# Patient Record
Sex: Male | Born: 1950 | Race: White | Hispanic: No | Marital: Married | Smoking: Former smoker
Health system: Southern US, Community
[De-identification: ages and names within clinical notes are randomized; demographics above are authoritative.]

## PROBLEM LIST (undated history)

## (undated) DIAGNOSIS — Z87442 Personal history of urinary calculi: Secondary | ICD-10-CM

## (undated) DIAGNOSIS — T7840XA Allergy, unspecified, initial encounter: Secondary | ICD-10-CM

## (undated) DIAGNOSIS — E119 Type 2 diabetes mellitus without complications: Secondary | ICD-10-CM

## (undated) DIAGNOSIS — K7460 Unspecified cirrhosis of liver: Secondary | ICD-10-CM

## (undated) DIAGNOSIS — I2510 Atherosclerotic heart disease of native coronary artery without angina pectoris: Secondary | ICD-10-CM

## (undated) HISTORY — PX: HERNIA REPAIR: SHX51

## (undated) HISTORY — DX: Allergy, unspecified, initial encounter: T78.40XA

---

## 2005-11-02 ENCOUNTER — Ambulatory Visit: Payer: Self-pay | Admitting: Family Medicine

## 2011-01-31 DIAGNOSIS — E119 Type 2 diabetes mellitus without complications: Secondary | ICD-10-CM | POA: Insufficient documentation

## 2011-01-31 DIAGNOSIS — L03119 Cellulitis of unspecified part of limb: Secondary | ICD-10-CM | POA: Insufficient documentation

## 2011-06-22 DIAGNOSIS — R300 Dysuria: Secondary | ICD-10-CM | POA: Insufficient documentation

## 2012-01-30 DIAGNOSIS — R7309 Other abnormal glucose: Secondary | ICD-10-CM | POA: Insufficient documentation

## 2012-07-13 DIAGNOSIS — M1990 Unspecified osteoarthritis, unspecified site: Secondary | ICD-10-CM | POA: Insufficient documentation

## 2012-08-09 ENCOUNTER — Emergency Department (HOSPITAL_COMMUNITY): Payer: Managed Care, Other (non HMO)

## 2012-08-09 ENCOUNTER — Encounter (HOSPITAL_COMMUNITY): Payer: Self-pay

## 2012-08-09 ENCOUNTER — Emergency Department (HOSPITAL_COMMUNITY)
Admission: EM | Admit: 2012-08-09 | Discharge: 2012-08-09 | Disposition: A | Discharge status: Home / Self Care | Payer: Managed Care, Other (non HMO) | Attending: Emergency Medicine | Admitting: Emergency Medicine

## 2012-08-09 DIAGNOSIS — E119 Type 2 diabetes mellitus without complications: Secondary | ICD-10-CM | POA: Insufficient documentation

## 2012-08-09 DIAGNOSIS — N200 Calculus of kidney: Secondary | ICD-10-CM | POA: Insufficient documentation

## 2012-08-09 DIAGNOSIS — R350 Frequency of micturition: Secondary | ICD-10-CM | POA: Insufficient documentation

## 2012-08-09 HISTORY — DX: Type 2 diabetes mellitus without complications: E11.9

## 2012-08-09 LAB — CBC WITH DIFFERENTIAL/PLATELET
Basophils Absolute: 0 K/uL (ref 0.0–0.1)
Basophils Relative: 0 % (ref 0–1)
Eosinophils Absolute: 0.3 K/uL (ref 0.0–0.7)
Eosinophils Relative: 3 % (ref 0–5)
HCT: 44.7 % (ref 39.0–52.0)
Hemoglobin: 15.3 g/dL (ref 13.0–17.0)
Lymphocytes Relative: 35 % (ref 12–46)
Lymphs Abs: 2.8 K/uL (ref 0.7–4.0)
MCH: 29.1 pg (ref 26.0–34.0)
MCHC: 34.2 g/dL (ref 30.0–36.0)
MCV: 85 fL (ref 78.0–100.0)
Monocytes Absolute: 0.9 K/uL (ref 0.1–1.0)
Monocytes Relative: 11 % (ref 3–12)
Neutro Abs: 4 K/uL (ref 1.7–7.7)
Neutrophils Relative %: 50 % (ref 43–77)
Platelets: 117 K/uL — ABNORMAL LOW (ref 150–400)
RBC: 5.26 MIL/uL (ref 4.22–5.81)
RDW: 13.7 % (ref 11.5–15.5)
WBC: 7.9 K/uL (ref 4.0–10.5)

## 2012-08-09 LAB — BASIC METABOLIC PANEL WITH GFR
BUN: 18 mg/dL (ref 6–23)
CO2: 20 meq/L (ref 19–32)
Calcium: 9 mg/dL (ref 8.4–10.5)
Chloride: 104 meq/L (ref 96–112)
Creatinine, Ser: 0.99 mg/dL (ref 0.50–1.35)
GFR calc Af Amer: >90 mL/min
GFR calc non Af Amer: 87 mL/min — ABNORMAL LOW
Glucose, Bld: 131 mg/dL — ABNORMAL HIGH (ref 70–99)
Potassium: 3.9 meq/L (ref 3.5–5.1)
Sodium: 135 meq/L (ref 135–145)

## 2012-08-09 LAB — URINE MICROSCOPIC-ADD ON

## 2012-08-09 LAB — URINALYSIS, ROUTINE W REFLEX MICROSCOPIC
Bilirubin Urine: NEGATIVE
Glucose, UA: NEGATIVE mg/dL
Ketones, ur: NEGATIVE mg/dL
Leukocytes, UA: NEGATIVE
Nitrite: NEGATIVE
Protein, ur: NEGATIVE mg/dL
Specific Gravity, Urine: >1.03 — ABNORMAL HIGH (ref 1.005–1.030)
Urobilinogen, UA: 0.2 mg/dL (ref 0.0–1.0)
pH: 5 (ref 5.0–8.0)

## 2012-08-09 MED ORDER — TAMSULOSIN HCL 0.4 MG PO CAPS: 0.4000 mg | ORAL_CAPSULE | Freq: Every day | ORAL | Status: DC

## 2012-08-09 MED ORDER — CIPROFLOXACIN HCL 500 MG PO TABS: 500.0000 mg | ORAL_TABLET | Freq: Two times a day (BID) | ORAL | Status: DC

## 2012-08-09 MED ORDER — ONDANSETRON 4 MG PO TBDP: ORAL_TABLET | ORAL | Status: DC

## 2012-08-09 MED ORDER — HYDROMORPHONE HCL PF 1 MG/ML IJ SOLN
1.0000 mg | Freq: Once | INTRAMUSCULAR | Status: AC
  Administered 2012-08-09: 1 mg via INTRAVENOUS
  Filled 2012-08-09: qty 1

## 2012-08-09 MED ORDER — OXYCODONE-ACETAMINOPHEN 5-325 MG PO TABS: 1.0000 | ORAL_TABLET | Freq: Four times a day (QID) | ORAL | Status: AC | PRN

## 2012-08-09 MED ORDER — ONDANSETRON HCL 4 MG/2ML IJ SOLN
4.0000 mg | Freq: Once | INTRAMUSCULAR | Status: AC
  Administered 2012-08-09: 4 mg via INTRAVENOUS
  Filled 2012-08-09: qty 2

## 2012-08-09 MED ORDER — SODIUM CHLORIDE 0.9 % IV BOLUS (SEPSIS)
500.0000 mL | Freq: Once | INTRAVENOUS | Status: AC
  Administered 2012-08-09: 500 mL via INTRAVENOUS

## 2012-08-09 NOTE — ED Notes (Signed)
C/o rt flank pain that started this am. Denies any difficulty voiding or nausea

## 2012-08-09 NOTE — ED Provider Notes (Signed)
History    This chart was scribed for Alexander Lennert, MD, MD by Smitty Pluck, ED Scribe. The patient was seen in room APA12 and the patient's care was started at 11:23 AM.   CSN: [id number]  Arrival date & time 08/09/12  4098      Chief Complaint  Patient presents with  . Flank Pain    (Consider location/radiation/quality/duration/timing/severity/associated sxs/prior treatment) Patient is a 61 y.o. male presenting with flank pain. The history is provided by the patient. No language interpreter was used.  Flank Pain This is a new problem. The current episode started 1 to 2 hours ago. The problem occurs constantly. The problem has not changed since onset.Pertinent negatives include no chest pain, no abdominal pain and no headaches. Nothing aggravates the symptoms. Nothing relieves the symptoms. He has tried nothing for the symptoms.   Wolf Boulay is a 62 y.o. male who presents to the Emergency Department complaining of constant, moderate right flank pain onset today. Pt denies hx of similar pain. He reports symptoms started suddenly. He denies nausea, vomiting, diarrhea, fever and any other pain..   Past Medical History  Diagnosis Date  . Diabetes mellitus without complication     Past Surgical History  Procedure Date  . Hernia repair     No family history on file.  History  Substance Use Topics  . Smoking status: Never Smoker   . Smokeless tobacco: Not on file  . Alcohol Use: No      Review of Systems  Constitutional: Negative for fatigue.  HENT: Negative for congestion, sinus pressure and ear discharge.   Eyes: Negative for discharge.  Respiratory: Negative for cough.   Cardiovascular: Negative for chest pain.  Gastrointestinal: Negative for abdominal pain and diarrhea.  Genitourinary: Positive for frequency and flank pain. Negative for hematuria.  Musculoskeletal: Negative for back pain.  Skin: Negative for rash.  Neurological: Negative for seizures and  headaches.  Hematological: Negative.   Psychiatric/Behavioral: Negative for hallucinations.  All other systems reviewed and are negative.    Allergies  Benadryl  Home Medications  No current outpatient prescriptions on file.  BP 155/97  Pulse 77  Temp 98.1 F (36.7 C)  Resp 16  Wt 264 lb (119.75 kg)  SpO2 94%  Physical Exam  Constitutional: He is oriented to person, place, and time. He appears well-developed.  HENT:  Head: Normocephalic and atraumatic.  Eyes: Conjunctivae normal and EOM are normal. No scleral icterus.  Neck: Neck supple. No thyromegaly present.  Cardiovascular: Normal rate and regular rhythm.  Exam reveals no gallop and no friction rub.   No murmur heard. Pulmonary/Chest: No stridor. He has no wheezes. He has no rales. He exhibits no tenderness.  Abdominal: He exhibits no distension. There is tenderness (right flank). There is no rebound.  Musculoskeletal: Normal range of motion. He exhibits no edema.  Lymphadenopathy:    He has no cervical adenopathy.  Neurological: He is oriented to person, place, and time. Coordination normal.  Skin: No rash noted. No erythema.  Psychiatric: He has a normal mood and affect. His behavior is normal.    ED Course  Procedures (including critical care time) DIAGNOSTIC STUDIES: Oxygen Saturation is 94% on room air, low by my interpretation.    COORDINATION OF CARE: 11:25 AM Discussed ED treatment with pt  11:27 AM Ordered: Medications - No data to display    Labs Reviewed - No data to display No results found.   No diagnosis found.  MDM  Kidney stones.  Spoke with dr. Jerre Simon and he will follow up monday      The chart was scribed for me under my direct supervision.  I personally performed the history, physical, and medical decision making and all procedures in the evaluation of this patient.Alexander Lennert, MD 08/09/12 [phone]

## 2012-08-10 LAB — URINE CULTURE

## 2012-08-13 ENCOUNTER — Encounter (HOSPITAL_COMMUNITY): Payer: Self-pay | Admitting: Pharmacy Technician

## 2012-08-14 NOTE — Patient Instructions (Addendum)
20 Alexander Parks  08/14/2012   Your procedure is scheduled on:  08/21/2012  Report to [facility] at  730  AM.  Call this number if you have problems the morning of surgery: [phone]   Remember:   Do not eat food:After Midnight.  May have clear liquids:until Midnight .    Take these medicines the morning of surgery with A SIP OF WATER:  Zofran,percocet,flomax   Do not wear jewelry, make-up or nail polish.  Do not wear lotions, powders, or perfumes.   Do not shave 48 hours prior to surgery. Men may shave face and neck.  Do not bring valuables to the hospital.  Contacts, dentures or bridgework may not be worn into surgery.  Leave suitcase in the car. After surgery it may be brought to your room.  For patients admitted to the hospital, checkout time is 11:00 AM the day of discharge.   Patients discharged the day of surgery will not be allowed to drive home.  Name and phone number of your driver: family  Special Instructions: Shower using CHG 2 nights before surgery and the night before surgery.  If you shower the day of surgery use CHG.  Use special wash - you have one bottle of CHG for all showers.  You should use approximately 1/3 of the bottle for each shower.   Please read over the following fact sheets that you were given: Pain Booklet, Coughing and Deep Breathing, MRSA Information, Surgical Site Infection Prevention, Anesthesia Post-op Instructions and Care and Recovery After Surgery Cystoscopy (Bladder Exam) A cystoscopy is an examination of your urinary bladder with a cystoscope. A cystoscope is an instrument like a small telescope with strong lights and lenses. It is inserted into the bladder through the urethra (the opening into the bladder) and allows your caregiver to examine the inside of your bladder. The procedure causes little discomfort and can be done in a hospital or office. It is a diagnostic procedure to evaluate the inside of your bladder. It may involve x-rays  to further evaluate the ureters or internal aspects of the kidneys. It may aid in the removal of urinary stones  or in taking tissue samples (biopsies) if necessary. The procedure is easier in females because of a shorter urethra. In a male, the procedure must be done through the penis. This often requires more sedation and more time to do the procedure. The procedure usually takes twenty minutes to one half hour for a male and approximately an hour for a male. LET YOUR CAREGIVERS KNOW ABOUT:  Allergies.  Medications taken including herbs, eye drops, over the counter medications, and creams.  Use of steroids (by mouth or creams).  Previous problems with anesthetics or novocaine.  Possibility of pregnancy, if this applies.  History of blood clots (thrombophlebitis).  History of bleeding or blood problems.  Previous surgery, especially where prosthetics have been used like hip or knee replacements, and heart valve replacements.  Other health problems. BEFORE THE PROCEDURE  You should be present 60 minutes prior to your procedure or as directed.  PROCEDURE During the procedure, you will:  Be assisted by your urologist and a nurse.  Lie on a cystoscopy table with your knees elevated and legs apart and covered with a drape. For women this is the same position as when a pap smear is taken.  Have the urethral area or penis washed and covered with sterile towels.  Have an anesthetic (numbing) jelly applied to the  urethra. This is usually all that is required for females but males may also require sedation.  Have the cystoscope inserted through the urethra and into the bladder. Sterile fluid will flow through the cystoscope and into the bladder. This will expand the bladder and provide clear fluid for the urologist to look through and examine the interior of the bladder.  Be allowed to go home once you are doing well, are stable, and awake if you were given a sedative. If given a  sedative, have someone give you a ride home. AFTER THE PROCEDURE  You may have temporary bleeding and burning on urination.  Drink lots of fluids. SEEK IMMEDIATE MEDICAL CARE IF:  There is an increase in blood in the urine or if you are passing clots.  You have difficulty in passing your urine  You develop chills and/or an unexplained oral temperature above 102 F (38.9 C). Your caregiver will discuss your results with you following the procedure. This may be at a later time if you have been sedated. If other testing or biopsies were taken, ask your caregiver how you are to obtain the results. Remember it is your responsibility to get your results. Do not assume everything is normal if you do not hear from your caregiver. Document Released: 07/29/2000 Document Revised: 10/24/2011 Document Reviewed: 01/23/2012 [facility] Patient Information 2013 [place], [place]. PATIENT INSTRUCTIONS POST-ANESTHESIA  IMMEDIATELY FOLLOWING SURGERY:  Do not drive or operate machinery for the first twenty four hours after surgery.  Do not make any important decisions for twenty four hours after surgery or while taking narcotic pain medications or sedatives.  If you develop intractable nausea and vomiting or a severe headache please notify your doctor immediately.  FOLLOW-UP:  Please make an appointment with your surgeon as instructed. You do not need to follow up with anesthesia unless specifically instructed to do so.  WOUND CARE INSTRUCTIONS (if applicable):  Keep a dry clean dressing on the anesthesia/puncture wound site if there is drainage.  Once the wound has quit draining you may leave it open to air.  Generally you should leave the bandage intact for twenty four hours unless there is drainage.  If the epidural site drains for more than 36-48 hours please call the anesthesia department.  QUESTIONS?:  Please feel free to call your physician or the hospital operator if you have any questions, and they will be  happy to assist you.

## 2012-08-16 ENCOUNTER — Encounter (HOSPITAL_COMMUNITY): Payer: Self-pay

## 2012-08-16 ENCOUNTER — Encounter (HOSPITAL_COMMUNITY)
Admission: RE | Admit: 2012-08-16 | Discharge: 2012-08-16 | Disposition: A | Discharge status: Home / Self Care | Payer: Managed Care, Other (non HMO) | Source: Ambulatory Visit | Attending: Urology | Admitting: Urology

## 2012-08-16 LAB — SURGICAL PCR SCREEN: Staphylococcus aureus: NEGATIVE

## 2012-08-16 NOTE — Progress Notes (Signed)
08/16/12 1349  OBSTRUCTIVE SLEEP APNEA  Have you ever been diagnosed with sleep apnea through a sleep study? No  Do you snore loudly (loud enough to be heard through closed doors)?  1  Do you often feel tired, fatigued, or sleepy during the daytime? 0  Has anyone observed you stop breathing during your sleep? 0  Do you have, or are you being treated for high blood pressure? 0  BMI more than 35 kg/m2? 1  Age over 62 years old? 1  Neck circumference greater than 40 cm/18 inches? 1  Gender: 1  Obstructive Sleep Apnea Score 5   Score 4 or greater  Results sent to PCP

## 2012-08-21 ENCOUNTER — Ambulatory Visit (HOSPITAL_COMMUNITY)
Admission: RE | Admit: 2012-08-21 | Discharge: 2012-08-21 | Disposition: A | Discharge status: Home / Self Care | Payer: Managed Care, Other (non HMO) | Source: Ambulatory Visit | Attending: Urology | Admitting: Urology

## 2012-08-21 ENCOUNTER — Ambulatory Visit (HOSPITAL_COMMUNITY): Payer: Managed Care, Other (non HMO) | Admitting: Anesthesiology

## 2012-08-21 ENCOUNTER — Ambulatory Visit (HOSPITAL_COMMUNITY): Payer: Managed Care, Other (non HMO)

## 2012-08-21 ENCOUNTER — Encounter (HOSPITAL_COMMUNITY): Payer: Self-pay | Admitting: Anesthesiology

## 2012-08-21 ENCOUNTER — Encounter (HOSPITAL_COMMUNITY)
Admission: RE | Disposition: A | Discharge status: Home / Self Care | Payer: Self-pay | Source: Ambulatory Visit | Attending: Urology

## 2012-08-21 ENCOUNTER — Encounter (HOSPITAL_COMMUNITY): Payer: Self-pay | Admitting: *Deleted

## 2012-08-21 DIAGNOSIS — N201 Calculus of ureter: Secondary | ICD-10-CM | POA: Insufficient documentation

## 2012-08-21 DIAGNOSIS — E119 Type 2 diabetes mellitus without complications: Secondary | ICD-10-CM | POA: Insufficient documentation

## 2012-08-21 DIAGNOSIS — Z01812 Encounter for preprocedural laboratory examination: Secondary | ICD-10-CM | POA: Insufficient documentation

## 2012-08-21 DIAGNOSIS — Z01810 Encounter for preprocedural cardiovascular examination: Secondary | ICD-10-CM | POA: Insufficient documentation

## 2012-08-21 HISTORY — PX: CYSTOSCOPY W/ URETERAL STENT PLACEMENT: SHX1429

## 2012-08-21 HISTORY — PX: BALLOON DILATION: SHX5330

## 2012-08-21 HISTORY — PX: HOLMIUM LASER APPLICATION: SHX5852

## 2012-08-21 HISTORY — PX: STONE EXTRACTION WITH BASKET: SHX5318

## 2012-08-21 SURGERY — CYSTOSCOPY, WITH RETROGRADE PYELOGRAM AND URETERAL STENT INSERTION
Anesthesia: General | Laterality: Right | Wound class: Clean Contaminated

## 2012-08-21 MED ORDER — LACTATED RINGERS IV SOLN
INTRAVENOUS | Status: DC
  Administered 2012-08-21 (×2): via INTRAVENOUS

## 2012-08-21 MED ORDER — IOHEXOL 350 MG/ML SOLN
INTRAVENOUS | Status: DC | PRN
  Administered 2012-08-21: 50 mL

## 2012-08-21 MED ORDER — LIDOCAINE HCL (PF) 1 % IJ SOLN
INTRAMUSCULAR | Status: AC
  Filled 2012-08-21: qty 5

## 2012-08-21 MED ORDER — ONDANSETRON HCL 4 MG/2ML IJ SOLN: 4.0000 mg | Freq: Once | INTRAMUSCULAR | Status: DC | PRN

## 2012-08-21 MED ORDER — FENTANYL CITRATE 0.05 MG/ML IJ SOLN: 25.0000 ug | INTRAMUSCULAR | Status: DC | PRN

## 2012-08-21 MED ORDER — MIDAZOLAM HCL 2 MG/2ML IJ SOLN
1.0000 mg | INTRAMUSCULAR | Status: DC | PRN
  Administered 2012-08-21: 2 mg via INTRAVENOUS

## 2012-08-21 MED ORDER — PROPOFOL 10 MG/ML IV EMUL
INTRAVENOUS | Status: AC
  Filled 2012-08-21: qty 20

## 2012-08-21 MED ORDER — MIDAZOLAM HCL 2 MG/2ML IJ SOLN
INTRAMUSCULAR | Status: AC
  Filled 2012-08-21: qty 2

## 2012-08-21 MED ORDER — FENTANYL CITRATE 0.05 MG/ML IJ SOLN
INTRAMUSCULAR | Status: DC | PRN
  Administered 2012-08-21: 25 ug via INTRAVENOUS
  Administered 2012-08-21: 50 ug via INTRAVENOUS

## 2012-08-21 MED ORDER — ONDANSETRON HCL 4 MG/2ML IJ SOLN
INTRAMUSCULAR | Status: AC
  Filled 2012-08-21: qty 2

## 2012-08-21 MED ORDER — LIDOCAINE HCL 1 % IJ SOLN
INTRAMUSCULAR | Status: DC | PRN
  Administered 2012-08-21: 50 mg via INTRADERMAL

## 2012-08-21 MED ORDER — SODIUM CHLORIDE 0.9 % IR SOLN
Status: DC | PRN
  Administered 2012-08-21 (×4): 3000 mL

## 2012-08-21 MED ORDER — MIDAZOLAM HCL 5 MG/5ML IJ SOLN
INTRAMUSCULAR | Status: DC | PRN
  Administered 2012-08-21: 2 mg via INTRAVENOUS

## 2012-08-21 MED ORDER — FENTANYL CITRATE 0.05 MG/ML IJ SOLN
INTRAMUSCULAR | Status: AC
  Filled 2012-08-21: qty 2

## 2012-08-21 MED ORDER — OXYCODONE-ACETAMINOPHEN 7.5-325 MG PO TABS: 1.0000 | ORAL_TABLET | Freq: Four times a day (QID) | ORAL | Status: DC | PRN

## 2012-08-21 MED ORDER — ONDANSETRON HCL 4 MG/2ML IJ SOLN
4.0000 mg | Freq: Once | INTRAMUSCULAR | Status: AC
  Administered 2012-08-21: 4 mg via INTRAVENOUS

## 2012-08-21 MED ORDER — 0.9 % SODIUM CHLORIDE (POUR BTL) OPTIME
TOPICAL | Status: DC | PRN
  Administered 2012-08-21 (×2): 1000 mL

## 2012-08-21 MED ORDER — STERILE WATER FOR IRRIGATION IR SOLN
Status: DC | PRN
  Administered 2012-08-21: 1000 mL

## 2012-08-21 MED ORDER — PROPOFOL 10 MG/ML IV BOLUS
INTRAVENOUS | Status: DC | PRN
  Administered 2012-08-21: 160 mg via INTRAVENOUS

## 2012-08-21 SURGICAL SUPPLY — 22 items
BAG DRAIN URO TABLE W/ADPT NS (DRAPE) ×2 IMPLANT
BAG DRN 8 ADPR NS SKTRN CSTL (DRAPE) ×1
BASKET/GRASPER STONE 3FR 13CM (MISCELLANEOUS) ×1 IMPLANT
CATH 5 FR WEDGE TIP (UROLOGICAL SUPPLIES) ×2 IMPLANT
CATH OPEN TIP 5FR (CATHETERS) ×2 IMPLANT
CLOTH BEACON ORANGE TIMEOUT ST (SAFETY) ×2 IMPLANT
DILATOR UROMAX ULTRA (MISCELLANEOUS) ×1 IMPLANT
GLOVE BIO SURGEON STRL SZ7 (GLOVE) ×2 IMPLANT
GOWN STRL REIN XL XLG (GOWN DISPOSABLE) ×2 IMPLANT
IV NS IRRIG 3000ML ARTHROMATIC (IV SOLUTION) ×6 IMPLANT
KIT ROOM TURNOVER AP CYSTO (KITS) ×2 IMPLANT
LASER FIBER DISP (UROLOGICAL SUPPLIES) ×2 IMPLANT
LASER FIBER DISP 1000U (UROLOGICAL SUPPLIES) IMPLANT
MANIFOLD NEPTUNE II (INSTRUMENTS) ×2 IMPLANT
NS IRRIG 1000ML POUR BTL (IV SOLUTION) ×2 IMPLANT
PACK CYSTO (CUSTOM PROCEDURE TRAY) ×2 IMPLANT
PAD ARMBOARD 7.5X6 YLW CONV (MISCELLANEOUS) ×2 IMPLANT
STENT PERCUFLEX 4.8FRX24 (STENTS) ×1 IMPLANT
STENT URET 6FRX24 CONTOUR (STENTS) ×1 IMPLANT
STONE RETRIEVAL GEMINI 2.4 FR (MISCELLANEOUS) ×2 IMPLANT
TOWEL OR 17X26 4PK STRL BLUE (TOWEL DISPOSABLE) ×1 IMPLANT
WIRE GUIDE BENTSON .035 15CM (WIRE) ×4 IMPLANT

## 2012-08-21 NOTE — Anesthesia Procedure Notes (Signed)
Procedure Name: LMA Insertion Date/Time: 08/21/2012 9:25 AM Performed by: Despina Hidden Pre-anesthesia Checklist: Emergency Drugs available, Suction available, Patient being monitored and Patient identified Patient Re-evaluated:Patient Re-evaluated prior to inductionOxygen Delivery Method: Circle system utilized Preoxygenation: Pre-oxygenation with 100% oxygen Intubation Type: IV induction Ventilation: Mask ventilation with difficulty LMA Size: 4.0 Grade View: Grade I Tube type: Oral Number of attempts: 1 Placement Confirmation: positive ETCO2 and breath sounds checked- equal and bilateral Tube secured with: Tape Dental Injury: Teeth and Oropharynx as per pre-operative assessment

## 2012-08-21 NOTE — Brief Op Note (Signed)
08/21/2012  12:11 PM  PATIENT:  Alexander Parks  62 y.o. male  PRE-OPERATIVE DIAGNOSIS:  right ureteral calculus  POST-OPERATIVE DIAGNOSIS:  right ureteral calculus  PROCEDURE:  Procedure(s) (LRB) with comments: CYSTOSCOPY WITH RETROGRADE PYELOGRAM/URETERAL STENT PLACEMENT (Right) STONE EXTRACTION WITH BASKET (Right) HOLMIUM LASER APPLICATION (Right) BALLOON DILATION (Right) - Balloon Dilation Right Ureter  SURGEON:  Surgeon(s) and Role:    * Ky Barban, MD - Primary  PHYSICIAN ASSISTANT:   ASSISTANTS: none   ANESTHESIA:   general  EBL:  Total I/O In: 1100 [I.V.:1100] Out: -   BLOOD ADMINISTERED:none  DRAINS:    LOCAL MEDICATIONS USED:  NONE  SPECIMEN:  Source of Specimen:  double j stent no   string  DISPOSITION OF SPECIMEN:  N/A  COUNTS:  YES  TOURNIQUET:  * No tourniquets in log *  DICTATION: .Other Dictation: Dictation Number dictation [id number]  PLAN OF CARE: Discharge to home after PACU  PATIENT DISPOSITION:  PACU - hemodynamically stable.   Delay start of Pharmacological VTE agent (>24hrs) due to surgical blood loss or risk of bleeding:

## 2012-08-21 NOTE — Transfer of Care (Signed)
Immediate Anesthesia Transfer of Care Note  Patient: Alexander Parks  Procedure(s) Performed: Procedure(s) (LRB) with comments: CYSTOSCOPY WITH RETROGRADE PYELOGRAM/URETERAL STENT PLACEMENT (Right) STONE EXTRACTION WITH BASKET (Right) HOLMIUM LASER APPLICATION (Right) BALLOON DILATION (Right) - Balloon Dilation Right Ureter  Patient Location: PACU  Anesthesia Type:General  Level of Consciousness: awake and patient cooperative  Airway & Oxygen Therapy: Patient Spontanous Breathing  Post-op Assessment: Report given to PACU RN, Post -op Vital signs reviewed and stable and Patient moving all extremities  Post vital signs: Reviewed and stable  Complications: No apparent anesthesia complications

## 2012-08-21 NOTE — Progress Notes (Signed)
No change in H&P on reexamination. 

## 2012-08-21 NOTE — Anesthesia Preprocedure Evaluation (Signed)
Anesthesia Evaluation  Patient identified by MRN, date of birth, ID band Patient awake    Reviewed: Allergy & Precautions, H&P , NPO status , Patient's Chart, lab work & pertinent test results  Airway Mallampati: I      Dental  (+) Edentulous Upper and Edentulous Lower   Pulmonary former smoker,  breath sounds clear to auscultation        Cardiovascular negative cardio ROS  Rhythm:Regular Rate:Normal     Neuro/Psych    GI/Hepatic negative GI ROS,   Endo/Other  diabetes, Type 2, Oral Hypoglycemic Agents  Renal/GU      Musculoskeletal   Abdominal   Peds  Hematology   Anesthesia Other Findings   Reproductive/Obstetrics                           Anesthesia Physical Anesthesia Plan  ASA: II  Anesthesia Plan: General   Post-op Pain Management:    Induction: Intravenous  Airway Management Planned: LMA  Additional Equipment:   Intra-op Plan:   Post-operative Plan: Extubation in OR  Informed Consent: I have reviewed the patients History and Physical, chart, labs and discussed the procedure including the risks, benefits and alternatives for the proposed anesthesia with the patient or authorized representative who has indicated his/her understanding and acceptance.     Plan Discussed with:   Anesthesia Plan Comments:         Anesthesia Quick Evaluation

## 2012-08-21 NOTE — Anesthesia Postprocedure Evaluation (Signed)
  Anesthesia Post-op Note  Patient: Alexander Parks  Procedure(s) Performed: Procedure(s) (LRB) with comments: CYSTOSCOPY WITH RETROGRADE PYELOGRAM/URETERAL STENT PLACEMENT (Right) - Cystoscopy with Right Retrograde Pyelogram, Right Ureteral Stent Placement STONE EXTRACTION WITH BASKET (Right) - Right Ureteral Stone Basket Extraction HOLMIUM LASER APPLICATION (Right) BALLOON DILATION (Right) - Balloon Dilation Right Ureter  Patient Location: PACU  Anesthesia Type:General  Level of Consciousness: awake, alert , oriented and patient cooperative  Airway and Oxygen Therapy: Patient Spontanous Breathing  Post-op Pain: none  Post-op Assessment: Post-op Vital signs reviewed, Patient's Cardiovascular Status Stable, Respiratory Function Stable, Patent Airway, No signs of Nausea or vomiting and Pain level controlled  Post-op Vital Signs: Reviewed and stable  Complications: No apparent anesthesia complications

## 2012-08-21 NOTE — H&P (Signed)
NAME:  Alexander, Parks NO.:  [id number]  MEDICAL RECORD NO.:  [id number]  LOCATION:                                 FACILITY:  PHYSICIAN:  Alexander Parks, M.D.DATE OF BIRTH:  03/25/1951  DATE OF ADMISSION:  08/21/2012 DATE OF DISCHARGE:  LH                             HISTORY & PHYSICAL   CHIEF COMPLAINT:  Right renal colic.  HISTORY OF PRESENT ILLNESS:  Alexander Parks is a 62 year old gentleman who was seen by me on August 13, 2012, with history of right renal colic. At that time, he told me 3-4 days before, he went to the emergency room. He went there on August 09, 2012, and he has been having right flank pain and with nausea.  No vomiting, fever, or chills.  No gross hematuria.  At that time, he had some gross hematuria before he went to the emergency room.  A CT scan showed that he has 2 ureteral calculi in the distal right ureter just above the ureterovesical junction.  One stone is 7 mm and the other one is 11 mm.  He also has a gallstone which is 6 mm causing no obstruction.  He was having no more pain.  Having some urgency.  PAST MEDICAL HISTORY:  Has type 2 diabetes and is a noninsulin type.  No high blood pressure.  He had right inguinal hernia repair done in 1984.  FAMILY HISTORY:  No history of kidney stones or prostate cancer.  PERSONAL HISTORY:  He does not smoke or drink.  REVIEW OF SYSTEMS:  Unremarkable.  PHYSICAL EXAMINATION:  GENERAL:  Moderately built male, not in acute distress fully conscious, alert, oriented. VITAL SIGNS:  Blood pressure 138/87, temperature 97. CENTRAL NERVOUS SYSTEM:  No gross neurological deficit. HEAD, NECK, EYE, ENT:  Negative. CHEST:  Symmetrical normal breath sounds. HEART:  Regular sinus rhythm.  No murmur. ABDOMEN:  Soft, flat.  Liver, spleen, kidneys not palpable, and 1+ right CVA tenderness. GU: External genitalia is circumcised.  Meatus adequate.  Testicles are normal. RECTAL:  Examination  deferred. EXTREMITIES:  Normal.  IMPRESSION:  Right distal ureteral calculi with right hydroureteronephrosis and cholelithiasis.  Questionable small fat containing left inguinal hernia.  PLAN:  Cystoscopy, lower right retrograde pyelogram, ureteroscopic stone basket extraction.  Procedure and its complications were discussed, especially I discussed with them ureteral perforation leading to open surgery requiring several days to recover.  Use of double-J stent and stone migration with no guarantees about that we will get the stone out. He understands and want me to go ahead and try to get the stone out.     Alexander Parks, M.D.     MIJ/MEDQ  D:  08/20/2012  T:  08/21/2012  Job:  [id number]

## 2012-08-22 NOTE — Op Note (Signed)
NAME:  CORDELL, Alexander Parks NO.:  [id number]  MEDICAL RECORD NO.:  [id number]  LOCATION:  [facility]                          FACILITY:  [facility]  PHYSICIAN:  Ky Barban, M.D.DATE OF BIRTH:  1951/08/05  DATE OF PROCEDURE: DATE OF DISCHARGE:  08/21/2012                              OPERATIVE REPORT   PREOPERATIVE DIAGNOSIS:  Distal right ureteral calculi.  POSTOPERATIVE DIAGNOSIS:  Distal right ureteral calculi.  PROCEDURE: 1. Cystoscopy. 2. Right retrograde pyelogram. 3. Right ureteroscopic stone basket extraction. 4. Partial and holmium laser lithotripsy, insertion of double-J stent     size 5-French 24 cm.  ANESTHESIA:  General.  DESCRIPTION OF PROCEDURE:  The patient under general anesthesia, in lithotomy position, after usual prep and drape, a #25 cystoscope introduced into the bladder.  It was inspected, looks normal right ureteral orifice catheterized with a wedge catheter and Hypaque was injected under fluoroscopic control.  There was a large stone in the intramural ureter, maybe about 1 cm proximal to the ureteral orifice, and there is a second filling defect just above that.  Which is much smaller now, a guidewire was passed up into the renal pelvis over the guidewire, a 15 balloon dilator was introduced into the intramural ureter.  The intramural ureter was dilated.  Once it was dilated, the balloon was removed leaving the guidewire in.  Overall, alongside the guidewire introduced short rigid ureteroscope went to the level of the stone.  The stone is under the mucosa.  With the help of holmium laser, I was able to break the stone completely.  Most of the pieces were subsequently taken out with the help of a basket.  The second stone which was just above this was a stone which I initially seen, I think maybe it was basketed along with the other stones.  I could not find it. I looked into the entire ureter and all the way to the ureteropelvic junction.   I do not see any other stone.  So at this point, there are a couple of small pieces there in the distal ureter in the intramural ureter. I decided to come out and a double-J stent is passed over the guidewire under fluoroscopic control, nice loop was obtained in the bladder and the renal pelvis.  After removing the guidewire.  All the instruments were removed.  The patient left the operating room in satisfactory condition.     Ky Barban, M.D.     MIJ/MEDQ  D:  08/21/2012  T:  08/22/2012  Job:  [id number]

## 2012-08-23 ENCOUNTER — Encounter (HOSPITAL_COMMUNITY): Payer: Self-pay | Admitting: Urology

## 2013-02-09 DIAGNOSIS — B372 Candidiasis of skin and nail: Secondary | ICD-10-CM | POA: Insufficient documentation

## 2013-02-09 DIAGNOSIS — N476 Balanoposthitis: Secondary | ICD-10-CM | POA: Insufficient documentation

## 2013-04-11 DIAGNOSIS — N200 Calculus of kidney: Secondary | ICD-10-CM | POA: Insufficient documentation

## 2013-09-02 DIAGNOSIS — S40019A Contusion of unspecified shoulder, initial encounter: Secondary | ICD-10-CM | POA: Insufficient documentation

## 2013-12-07 DIAGNOSIS — M25519 Pain in unspecified shoulder: Secondary | ICD-10-CM | POA: Insufficient documentation

## 2014-04-29 ENCOUNTER — Ambulatory Visit: Payer: Self-pay | Admitting: Family Medicine

## 2014-04-29 LAB — BASIC METABOLIC PANEL
ANION GAP: 14 (ref 7–16)
BUN: 14 mg/dL (ref 7–18)
CO2: 20 mmol/L — AB (ref 21–32)
Calcium, Total: 9.1 mg/dL (ref 8.5–10.1)
Chloride: 101 mmol/L (ref 98–107)
Creatinine: 0.91 mg/dL (ref 0.60–1.30)
EGFR (African American): >60
Glucose: 164 mg/dL — ABNORMAL HIGH (ref 65–99)
Osmolality: 274 (ref 275–301)
Potassium: 3.5 mmol/L (ref 3.5–5.1)
Sodium: 135 mmol/L — ABNORMAL LOW (ref 136–145)

## 2014-04-29 LAB — DOT URINE DIP
Blood: NEGATIVE
Protein: NEGATIVE
Specific Gravity: >=1.03 (ref 1.000–1.030)

## 2014-04-29 LAB — HEMOGLOBIN A1C: HEMOGLOBIN A1C: 8.8 % — AB (ref 4.2–6.3)

## 2014-10-02 DIAGNOSIS — E669 Obesity, unspecified: Secondary | ICD-10-CM | POA: Insufficient documentation

## 2016-09-28 DIAGNOSIS — R5381 Other malaise: Secondary | ICD-10-CM | POA: Insufficient documentation

## 2016-09-28 DIAGNOSIS — R6883 Chills (without fever): Secondary | ICD-10-CM | POA: Insufficient documentation

## 2016-09-28 DIAGNOSIS — R197 Diarrhea, unspecified: Secondary | ICD-10-CM | POA: Insufficient documentation

## 2016-09-28 DIAGNOSIS — R5383 Other fatigue: Secondary | ICD-10-CM | POA: Insufficient documentation

## 2017-07-05 ENCOUNTER — Other Ambulatory Visit: Payer: Self-pay | Admitting: *Deleted

## 2017-07-05 NOTE — Patient Outreach (Signed)
[employer] HRA completed and AWV scheduled. I will also be providing diabetes education and will request continued care management involvement.  Alexander Parks. Myrtie Neither, MSN, [facility] Gerontological Nurse Practitioner [facility] Care Management [phone]

## 2017-07-14 ENCOUNTER — Encounter: Payer: Self-pay | Admitting: *Deleted

## 2017-07-14 ENCOUNTER — Other Ambulatory Visit: Payer: Self-pay | Admitting: *Deleted

## 2017-07-14 VITALS — BP 160/84 | HR 95 | Resp 18 | Ht 74.0 in | Wt 242.0 lb

## 2017-07-14 DIAGNOSIS — I10 Essential (primary) hypertension: Secondary | ICD-10-CM

## 2017-07-14 DIAGNOSIS — E119 Type 2 diabetes mellitus without complications: Secondary | ICD-10-CM

## 2017-07-14 NOTE — Initial Assessments (Deleted)
HTA [place] Visit  Name: Alexander Parks     DOB: 10/26/1950     MRN: [id number]  Alexander Parks is a 66 y.o. male who presents for Medicare Annual  preventive examination.     There were no vitals filed for this visit. There is no height or weight on file to calculate BMI.  Tobacco Social History   Tobacco Use  Smoking Status Former Smoker  . Packs/day: 2.00  . Years: 15.00  . Pack years: 30.00  . Types: Cigarettes  . Last attempt to quit: 08/16/1996  . Years since quitting: 20.9     Counseling given: Not Answered   Advanced Directive information    Contact Information    Name Relation Home Work [place] Spouse Abie Daughter [phone]        Past Medical History:  Diagnosis Date  . Diabetes mellitus without complication    Past Surgical History:  Procedure Laterality Date  . BALLOON DILATION  08/21/2012   Procedure: BALLOON DILATION;  Surgeon: Marissa Nestle, MD;  Location: [facility];  Service: Urology;  Laterality: Right;  Balloon Dilation Right Ureter  . CYSTOSCOPY W/ URETERAL STENT PLACEMENT  08/21/2012   Procedure: CYSTOSCOPY WITH RETROGRADE PYELOGRAM/URETERAL STENT PLACEMENT;  Surgeon: Marissa Nestle, MD;  Location: [facility];  Service: Urology;  Laterality: Right;  Cystoscopy with Right Retrograde Pyelogram, Right Ureteral Stent Placement  . HERNIA REPAIR    . HOLMIUM LASER APPLICATION  11/16/100   Procedure: HOLMIUM LASER APPLICATION;  Surgeon: Marissa Nestle, MD;  Location: [facility];  Service: Urology;  Laterality: Right;  Holmium Laser Application to Right Ureteral Calculus  . STONE EXTRACTION WITH BASKET  08/21/2012   Procedure: STONE EXTRACTION WITH BASKET;  Surgeon: Marissa Nestle, MD;  Location: [facility];  Service: Urology;  Laterality: Right;  Right Ureteral Stone Basket Extraction   No family history on file. Social History   Substance and Sexual Activity  Sexual Activity Yes    Outpatient Encounter  Medications as of 07/14/2017  Medication Sig  . ciprofloxacin (CIPRO) 500 MG tablet Take 1 tablet (500 mg total) by mouth 2 (two) times daily.  . ondansetron (ZOFRAN ODT) 4 MG disintegrating tablet Take one every 6-8 hours for nauseau  . oxyCODONE-acetaminophen (PERCOCET) 7.5-325 MG per tablet Take 1 tablet by mouth every 6 (six) hours as needed for pain.  . Tamsulosin HCl (FLOMAX) 0.4 MG CAPS Take 1 capsule (0.4 mg total) by mouth daily.   No facility-administered encounter medications on file as of 07/14/2017.     Activities of Daily Living No flowsheet data found.  Patient Care Team: Manon Hilding, MD as PCP - General (Cardiology)  Diagnosis:    *** No diagnosis found.  Exercise Activities and Dietary recommendations    Goals    None     Fall Risk No flowsheet data found. Safety   Depression Screen PHQ 2/9 Scores 07/05/2017  PHQ - 2 Score 0     There is no immunization history on file for this patient. Screening Tests Health Maintenance  Topic Date Due  . Hepatitis C Screening  1951/04/16  . TETANUS/TDAP  11/09/1969  . COLONOSCOPY  11/09/2000  . PNA vac Low Risk Adult (1 of 2 - PCV13) 11/10/2015  . INFLUENZA VACCINE  03/15/2017       Plan:     ***  I have personally reviewed and noted the following in the patient's chart:   .  Medical and social history . Use of alcohol, tobacco or illicit drugs  . Current medications and supplements . Functional ability and status . Nutritional status .  Marland Kitchen Physical activity . Advanced directives . List of other physicians . Hospitalizations, surgeries, and ER visits in previous 12 months . Vitals . Screenings to include cognitive, depression, and falls . Referrals and appointments  In addition, I have reviewed and discussed with patient certain preventive protocols, quality metrics, and best practice recommendations. A written personalized care plan for preventive services as well as general preventive health  recommendations were provided to patient.     Eulah Pont. Cornesha Radziewicz, MSN, GNP-BC  07/14/2017 Gerontological Nurse Practitioner [facility] Care Management

## 2017-07-14 NOTE — Patient Outreach (Addendum)
[facility]A [place] Visit  Name: CONLIN BRAHM     DOB: Aug 16, 1950     MRN: [id number]  ROB MCIVER is a 66 y.o. male who presents for Medicare Annual  preventive examination. Pt has a primary care provider, Dr. Consuello Masse, but only goes when he is sick. He does not get vaccinations. He has not had a colonoscopy.    BP (!) 160/84 (BP Location: Left Arm, Patient Position: Sitting, Cuff Size: Normal)   Pulse 95   Resp 18   Ht 1.88 m (6\' 2" )   Wt 242 lb (109.8 kg)   SpO2 95%   BMI 31.07 kg/m   Tobacco  Social History   Tobacco Use  Smoking Status Former Smoker  . Packs/day: 2.00  . Years: 15.00  . Pack years: 30.00  . Types: Cigarettes  . Last attempt to quit: 08/16/1996  . Years since quitting: 20.9     Advanced Directive information  Reports he has a Durable POA but I have him a HCPOA and Living Will documents.   Contact Information    Name Relation Home Work [place] Spouse [place] Daughter [phone]       Past Medical History:  Diagnosis Date  . Diabetes mellitus without complication    Past Surgical History:  Procedure Laterality Date  . BALLOON DILATION  08/21/2012   Procedure: BALLOON DILATION;  Surgeon: Marissa Nestle, MD;  Location: [facility];  Service: Urology;  Laterality: Right;  Balloon Dilation Right Ureter  . CYSTOSCOPY W/ URETERAL STENT PLACEMENT  08/21/2012   Procedure: CYSTOSCOPY WITH RETROGRADE PYELOGRAM/URETERAL STENT PLACEMENT;  Surgeon: Marissa Nestle, MD;  Location: [facility];  Service: Urology;  Laterality: Right;  Cystoscopy with Right Retrograde Pyelogram, Right Ureteral Stent Placement  . HERNIA REPAIR    . HOLMIUM LASER APPLICATION  10/16/74   Procedure: HOLMIUM LASER APPLICATION;  Surgeon: Marissa Nestle, MD;  Location: [facility];  Service: Urology;  Laterality: Right;  Holmium Laser Application to Right Ureteral Calculus  . STONE EXTRACTION WITH BASKET  08/21/2012   Procedure: STONE EXTRACTION WITH  BASKET;  Surgeon: Marissa Nestle, MD;  Location: [facility];  Service: Urology;  Laterality: Right;  Right Ureteral Stone Basket Extraction   Family History  Problem Relation Age of Onset  . Cancer Mother   . Cancer Sister   . Liver disease Brother    Social History   Substance and Sexual Activity  No ETOH or illegal drugs  Sexual Activity Yes   Outpatient Encounter Medications as of 07/14/2017  Medication Sig  . Aspirin-Salicylamide-Caffeine (BC HEADACHE POWDER PO) Take by mouth as needed.  . metFORMIN (GLUCOPHAGE-XR) 500 MG 24 hr tablet Take 500 mg by mouth daily with breakfast. Take 2 tabs bid.  . [DISCONTINUED] ciprofloxacin (CIPRO) 500 MG tablet Take 1 tablet (500 mg total) by mouth 2 (two) times daily.  . [DISCONTINUED] ondansetron (ZOFRAN ODT) 4 MG disintegrating tablet Take one every 6-8 hours for nauseau (Patient not taking: Reported on 07/14/2017)  . [DISCONTINUED] oxyCODONE-acetaminophen (PERCOCET) 7.5-325 MG per tablet Take 1 tablet by mouth every 6 (six) hours as needed for pain. (Patient not taking: Reported on 07/14/2017)  . [DISCONTINUED] Tamsulosin HCl (FLOMAX) 0.4 MG CAPS Take 1 capsule (0.4 mg total) by mouth daily. (Patient not taking: Reported on 07/14/2017)     Patient Care Team: Manon Hilding, MD as PCP - General (Cardiology)  Diagnosis:    Encounter Diagnoses  Name  Primary?  . Type 2 diabetes mellitus without complication, without long-term current use of insulin ([place]) Yes  . Essential hypertension    Exercise Activities and Dietary recommendations    Continue your usual household and yard chores, be as active as possible. Follow a ADA diabetic diet.  Fall Risk  Fall Risk  07/14/2017  Falls in the past year? No   Depression Screen PHQ 2/9 Scores 07/05/2017  PHQ - 2 Score 0    There is no immunization history on file for this patient. Pt needs updated Tdap, has never had colonoscopy, needs encouragement. Refuses flu vaccine. Recommend Hep C  screening.   Plan:    Please see Dr. Quintin Alto to evaluate hypertension and complete screening. Needs annual diabetic opthomology appointment, needs Labs for diabetes gaps in care, foot exam.  I have personally reviewed and noted the following in the patient's chart:   . Medical and social history . Use of alcohol, tobacco or illicit drugs  . Current medications and supplements . Functional ability and status . Nutritional status . Physical activity . Advanced directives . List of other physicians . Hospitalizations, surgeries, and ER visits in previous 12 months . Vitals . Screenings to include cognitive, depression, and falls . Referrals and appointments  In addition, I have reviewed and discussed with patient certain preventive protocols, quality metrics, and best practice recommendations. A written personalized care plan for preventive services as well as general preventive health recommendations were provided to patient.   Eulah Pont. Olliver Boyadjian, MSN, GNP-BC  07/14/2017 Gerontological Nurse Practitioner [facility] Care Management

## 2017-07-14 NOTE — Patient Instructions (Addendum)
Alexander Parks , Thank you for taking time see me for your Medicare Wellness Visit. I appreciate your ongoing commitment to your health goals. Please review the following plan we discussed and let me know if I can assist you in the future.   These are the goals we discussed:   PLEASE MAKE AN APPT TO SEE DR. SASSER.  FOLLOW A DIABETIC DIET. CHECK BLOOD GLUCOSE DAILY. SEE AN OPTHAMOLOGIST FOR DIABETIC EYE EXAM. CHECK YOUR FEET DAILY!      Preventive Care 65 Years and Older, Male Preventive care refers to lifestyle choices and visits with your health care provider that can promote health and wellness. What does preventive care include?  A yearly physical exam. This is also called an annual well check.  Dental exams once or twice a year.  Routine eye exams. Ask your health care provider how often you should have your eyes checked.  Personal lifestyle choices, including: ? Daily care of your teeth and gums. ? Regular physical activity. ? Eating a healthy diet. ? Avoiding tobacco and drug use. ? Limiting alcohol use. ? Practicing safe sex. ? Taking low doses of aspirin every day. ? Taking vitamin and mineral supplements as recommended by your health care provider. What happens during an annual well check? The services and screenings done by your health care provider during your annual well check will depend on your age, overall health, lifestyle risk factors, and family history of disease. Counseling Your health care provider may ask you questions about your:  Alcohol use.  Tobacco use.  Drug use.  Emotional well-being.  Home and relationship well-being.  Sexual activity.  Eating habits.  History of falls.  Memory and ability to understand (cognition).  Work and work environment.  Screening You may have the following tests or measurements:  Height, weight, and BMI.  Blood pressure.  Lipid and cholesterol levels. These may be checked every 5 years, or more  frequently if you are over 50 years old.  Skin check.  Lung cancer screening. You may have this screening every year starting at age 55 if you have a 30-pack-year history of smoking and currently smoke or have quit within the past 15 years.  Fecal occult blood test (FOBT) of the stool. You may have this test every year starting at age 50.  Flexible sigmoidoscopy or colonoscopy. You may have a sigmoidoscopy every 5 years or a colonoscopy every 10 years starting at age 50.  Prostate cancer screening. Recommendations will vary depending on your family history and other risks.  Hepatitis C blood test.  Hepatitis B blood test.  Sexually transmitted disease (STD) testing.  Diabetes screening. This is done by checking your blood sugar (glucose) after you have not eaten for a while (fasting). You may have this done every 1-3 years.  Abdominal aortic aneurysm (AAA) screening. You may need this if you are a current or former smoker.  Osteoporosis. You may be screened starting at age 70 if you are at high risk.  Talk with your health care provider about your test results, treatment options, and if necessary, the need for more tests. Vaccines Your health care provider may recommend certain vaccines, such as:  Influenza vaccine. This is recommended every year.  Tetanus, diphtheria, and acellular pertussis (Tdap, Td) vaccine. You may need a Td booster every 10 years.  Varicella vaccine. You may need this if you have not been vaccinated.  Zoster vaccine. You may need this after age 60.  Measles, mumps, and rubella (  MMR) vaccine. You may need at least one dose of MMR if you were born in 1957 or later. You may also need a second dose.  Pneumococcal 13-valent conjugate (PCV13) vaccine. One dose is recommended after age 65.  Pneumococcal polysaccharide (PPSV23) vaccine. One dose is recommended after age 65.  Meningococcal vaccine. You may need this if you have certain conditions.  Hepatitis  A vaccine. You may need this if you have certain conditions or if you travel or work in places where you may be exposed to hepatitis A.  Hepatitis B vaccine. You may need this if you have certain conditions or if you travel or work in places where you may be exposed to hepatitis B.  Haemophilus influenzae type b (Hib) vaccine. You may need this if you have certain risk factors.  Talk to your health care provider about which screenings and vaccines you need and how often you need them. This information is not intended to replace advice given to you by your health care provider. Make sure you discuss any questions you have with your health care provider. Document Released: 08/28/2015 Document Revised: 04/20/2016 Document Reviewed: 06/02/2015 Elsevier Interactive Patient Education  2017 Elsevier Inc.  

## 2017-10-26 DIAGNOSIS — R042 Hemoptysis: Secondary | ICD-10-CM | POA: Diagnosis not present

## 2017-10-26 DIAGNOSIS — Z6832 Body mass index (BMI) 32.0-32.9, adult: Secondary | ICD-10-CM | POA: Diagnosis not present

## 2017-11-17 DIAGNOSIS — J189 Pneumonia, unspecified organism: Secondary | ICD-10-CM | POA: Diagnosis not present

## 2017-11-17 DIAGNOSIS — Z1331 Encounter for screening for depression: Secondary | ICD-10-CM | POA: Diagnosis not present

## 2017-11-17 DIAGNOSIS — Z6831 Body mass index (BMI) 31.0-31.9, adult: Secondary | ICD-10-CM | POA: Diagnosis not present

## 2017-11-17 DIAGNOSIS — Z0000 Encounter for general adult medical examination without abnormal findings: Secondary | ICD-10-CM | POA: Insufficient documentation

## 2017-11-17 DIAGNOSIS — Z1389 Encounter for screening for other disorder: Secondary | ICD-10-CM | POA: Diagnosis not present

## 2017-11-17 DIAGNOSIS — R042 Hemoptysis: Secondary | ICD-10-CM | POA: Diagnosis not present

## 2017-11-17 DIAGNOSIS — Z0001 Encounter for general adult medical examination with abnormal findings: Secondary | ICD-10-CM | POA: Diagnosis not present

## 2017-11-17 DIAGNOSIS — E119 Type 2 diabetes mellitus without complications: Secondary | ICD-10-CM | POA: Diagnosis not present

## 2017-12-08 DIAGNOSIS — E782 Mixed hyperlipidemia: Secondary | ICD-10-CM | POA: Insufficient documentation

## 2017-12-08 DIAGNOSIS — Z6831 Body mass index (BMI) 31.0-31.9, adult: Secondary | ICD-10-CM | POA: Diagnosis not present

## 2017-12-08 DIAGNOSIS — E1165 Type 2 diabetes mellitus with hyperglycemia: Secondary | ICD-10-CM | POA: Diagnosis not present

## 2017-12-08 DIAGNOSIS — N481 Balanitis: Secondary | ICD-10-CM | POA: Diagnosis not present

## 2017-12-08 DIAGNOSIS — Z681 Body mass index (BMI) 19 or less, adult: Secondary | ICD-10-CM | POA: Insufficient documentation

## 2017-12-08 DIAGNOSIS — R5383 Other fatigue: Secondary | ICD-10-CM | POA: Diagnosis not present

## 2018-01-04 DIAGNOSIS — Z6832 Body mass index (BMI) 32.0-32.9, adult: Secondary | ICD-10-CM | POA: Diagnosis not present

## 2018-01-04 DIAGNOSIS — J189 Pneumonia, unspecified organism: Secondary | ICD-10-CM | POA: Diagnosis not present

## 2018-01-10 DIAGNOSIS — E782 Mixed hyperlipidemia: Secondary | ICD-10-CM | POA: Diagnosis not present

## 2018-01-10 DIAGNOSIS — I5021 Acute systolic (congestive) heart failure: Secondary | ICD-10-CM | POA: Diagnosis not present

## 2018-01-10 DIAGNOSIS — Z6832 Body mass index (BMI) 32.0-32.9, adult: Secondary | ICD-10-CM | POA: Diagnosis not present

## 2018-01-10 DIAGNOSIS — E1165 Type 2 diabetes mellitus with hyperglycemia: Secondary | ICD-10-CM | POA: Diagnosis not present

## 2018-01-10 DIAGNOSIS — J189 Pneumonia, unspecified organism: Secondary | ICD-10-CM | POA: Diagnosis not present

## 2018-01-11 ENCOUNTER — Ambulatory Visit (INDEPENDENT_AMBULATORY_CARE_PROVIDER_SITE_OTHER): Payer: Medicare HMO

## 2018-01-11 ENCOUNTER — Other Ambulatory Visit: Payer: Self-pay

## 2018-01-11 ENCOUNTER — Encounter: Payer: Self-pay | Admitting: Cardiology

## 2018-01-11 ENCOUNTER — Ambulatory Visit (INDEPENDENT_AMBULATORY_CARE_PROVIDER_SITE_OTHER): Payer: Medicare HMO | Admitting: Cardiology

## 2018-01-11 VITALS — BP 103/69 | HR 98 | Ht 74.0 in | Wt 243.0 lb

## 2018-01-11 DIAGNOSIS — R0602 Shortness of breath: Secondary | ICD-10-CM | POA: Diagnosis not present

## 2018-01-11 DIAGNOSIS — I5021 Acute systolic (congestive) heart failure: Secondary | ICD-10-CM

## 2018-01-11 LAB — ECHOCARDIOGRAM COMPLETE
HEIGHTINCHES: 74 in
Weight: 3888 oz

## 2018-01-11 NOTE — Progress Notes (Signed)
Clinical Summary Mr. Tetreault is a 67 y.o.male seen as new consult, referred by Dr Quintin Alto for acute onset of congestive heart failure  1. Acute CHF, unknown type - pcp note from 01/10/18 reports recent leg edema, SOB.  - CXR 01/04/18 with pulm edema  - symptoms started just a few days ago. +SOB. DOE just walking in from parking lot - sleeps sitting up since Thursday - no chest pain - started lasix 40mg  daily, then increased to 40mg  bid. Some improvement in swelling. - limiting sodium intake.    2. Pneumonia  -recent diagnosis and treatment by pcp   Past Medical History:  Diagnosis Date  . Allergy   . Diabetes mellitus without complication (HCC)      Allergies  Allergen Reactions  . Benadryl [Diphenhydramine Hcl] Swelling     Current Outpatient Medications  Medication Sig Dispense Refill  . Aspirin-Salicylamide-Caffeine (BC HEADACHE POWDER PO) Take by mouth as needed.    . metFORMIN (GLUCOPHAGE-XR) 500 MG 24 hr tablet Take 500 mg by mouth daily with breakfast. Take 2 tabs bid.     No current facility-administered medications for this visit.      Past Surgical History:  Procedure Laterality Date  . BALLOON DILATION  08/21/2012   Procedure: BALLOON DILATION;  Surgeon: Marissa Nestle, MD;  Location: [facility];  Service: Urology;  Laterality: Right;  Balloon Dilation Right Ureter  . CYSTOSCOPY W/ URETERAL STENT PLACEMENT  08/21/2012   Procedure: CYSTOSCOPY WITH RETROGRADE PYELOGRAM/URETERAL STENT PLACEMENT;  Surgeon: Marissa Nestle, MD;  Location: [facility];  Service: Urology;  Laterality: Right;  Cystoscopy with Right Retrograde Pyelogram, Right Ureteral Stent Placement  . HERNIA REPAIR    . HOLMIUM LASER APPLICATION  01/16/1496   Procedure: HOLMIUM LASER APPLICATION;  Surgeon: Marissa Nestle, MD;  Location: [facility];  Service: Urology;  Laterality: Right;  Holmium Laser Application to Right Ureteral Calculus  . STONE EXTRACTION WITH BASKET  08/21/2012   Procedure: STONE  EXTRACTION WITH BASKET;  Surgeon: Marissa Nestle, MD;  Location: [facility];  Service: Urology;  Laterality: Right;  Right Ureteral Stone Basket Extraction     Allergies  Allergen Reactions  . Benadryl [Diphenhydramine Hcl] Swelling      Family History  Problem Relation Age of Onset  . Cancer Mother   . Cancer Sister   . Liver disease Brother      Social History Mr. Venard reports that he quit smoking about 21 years ago. His smoking use included cigarettes. He has a 30.00 pack-year smoking history. He has never used smokeless tobacco. Mr. Callaway reports that he does not drink alcohol.   Review of Systems CONSTITUTIONAL: No weight loss, fever, chills, weakness or fatigue.  HEENT: Eyes: No visual loss, blurred vision, double vision or yellow sclerae.No hearing loss, sneezing, congestion, runny nose or sore throat.  SKIN: No rash or itching.  CARDIOVASCULAR: per hpi RESPIRATORY: per hpi  GASTROINTESTINAL: No anorexia, nausea, vomiting or diarrhea. No abdominal pain or blood.  GENITOURINARY: No burning on urination, no polyuria NEUROLOGICAL: No headache, dizziness, syncope, paralysis, ataxia, numbness or tingling in the extremities. No change in bowel or bladder control.  MUSCULOSKELETAL: No muscle, back pain, joint pain or stiffness.  LYMPHATICS: No enlarged nodes. No history of splenectomy.  PSYCHIATRIC: No history of depression or anxiety.  ENDOCRINOLOGIC: No reports of sweating, cold or heat intolerance. No polyuria or polydipsia.  Marland Kitchen   Physical Examination Vitals:   01/11/18 0950  BP: 103/69  Pulse: 98  SpO2: 94%   Vitals:   01/11/18 0950  Weight: 243 lb (110.2 kg)  Height: 6\' 2"  (1.88 m)    Gen: resting comfortably, no acute distress HEENT: no scleral icterus, pupils equal round and reactive, no palptable cervical adenopathy,  CV: RRR, no m/r/g. Elevated JVD Resp: bilateral crackles GI: abdomen is soft, non-tender, non-distended, normal bowel sounds, no  hepatosplenomegaly MSK: extremities are warm, 2+ bilateral LE edema Skin: warm, no rash Neuro:  no focal deficits Psych: appropriate affect      Assessment and Plan  1. Acute heart failure, unknown type - presents with clinical congestive heart failure, cardiac function/etiology is unknown at this time - order echocardiogram, will schedule for today - continue lasix 40mg  bid. Asked to check daily weights. On diuretic will check BMET/Mg in 2 weeks. Since new onset CHF also check TSH - EKG shows sinus tach 100, RBBB, nonspecific ST/T changes - further recs pending echo results.    F/u 3 weeks.       Arnoldo Lenis, M.D.

## 2018-01-11 NOTE — Patient Instructions (Signed)
Your physician recommends that you schedule a follow-up appointment in: [place]  Your physician recommends that you continue on your current medications as directed. Please refer to the Current Medication list given to you today.  Your physician has requested that you have an echocardiogram. Echocardiography is a painless test that uses sound waves to create images of your heart. It provides your doctor with information about the size and shape of your heart and how well your heart's chambers and valves are working. This procedure takes approximately one hour. There are no restrictions for this procedure.  Your physician recommends that you return for lab work in: 1 WEEK BMP/MG/TSH   Thank you for choosing [facility]!!

## 2018-01-12 ENCOUNTER — Encounter: Payer: Self-pay | Admitting: *Deleted

## 2018-01-16 ENCOUNTER — Telehealth: Payer: Self-pay | Admitting: *Deleted

## 2018-01-16 NOTE — Telephone Encounter (Signed)
-----   Message from Arnoldo Lenis, MD sent at 01/12/2018  3:25 PM EDT ----- Echo does show some weakness in the pumping function of his heart, this explains why he has built up so much fluid. We will discuss in detail at our f/u the neccesary additional treatments and testing.   Zandra Abts MD

## 2018-01-16 NOTE — Telephone Encounter (Signed)
Pt and wife aware and voiced understanding - 6/21 f/u - routed to pcp

## 2018-01-18 DIAGNOSIS — I5021 Acute systolic (congestive) heart failure: Secondary | ICD-10-CM | POA: Diagnosis not present

## 2018-01-25 DIAGNOSIS — E1165 Type 2 diabetes mellitus with hyperglycemia: Secondary | ICD-10-CM | POA: Diagnosis not present

## 2018-01-25 DIAGNOSIS — Z87891 Personal history of nicotine dependence: Secondary | ICD-10-CM | POA: Diagnosis not present

## 2018-01-25 DIAGNOSIS — Z7982 Long term (current) use of aspirin: Secondary | ICD-10-CM | POA: Diagnosis not present

## 2018-01-25 DIAGNOSIS — Z6841 Body Mass Index (BMI) 40.0 and over, adult: Secondary | ICD-10-CM | POA: Diagnosis not present

## 2018-01-25 DIAGNOSIS — Z7984 Long term (current) use of oral hypoglycemic drugs: Secondary | ICD-10-CM | POA: Diagnosis not present

## 2018-01-25 DIAGNOSIS — I509 Heart failure, unspecified: Secondary | ICD-10-CM | POA: Diagnosis not present

## 2018-01-25 DIAGNOSIS — Z888 Allergy status to other drugs, medicaments and biological substances status: Secondary | ICD-10-CM | POA: Diagnosis not present

## 2018-02-02 ENCOUNTER — Telehealth: Payer: Self-pay | Admitting: Cardiology

## 2018-02-02 ENCOUNTER — Encounter: Payer: Self-pay | Admitting: *Deleted

## 2018-02-02 ENCOUNTER — Encounter: Payer: Self-pay | Admitting: Cardiology

## 2018-02-02 ENCOUNTER — Ambulatory Visit (INDEPENDENT_AMBULATORY_CARE_PROVIDER_SITE_OTHER): Payer: Medicare HMO | Admitting: Cardiology

## 2018-02-02 VITALS — BP 85/50 | HR 95 | Ht 74.0 in | Wt 229.0 lb

## 2018-02-02 DIAGNOSIS — R0602 Shortness of breath: Secondary | ICD-10-CM | POA: Diagnosis not present

## 2018-02-02 DIAGNOSIS — Z01812 Encounter for preprocedural laboratory examination: Secondary | ICD-10-CM | POA: Diagnosis not present

## 2018-02-02 DIAGNOSIS — I5022 Chronic systolic (congestive) heart failure: Secondary | ICD-10-CM

## 2018-02-02 MED ORDER — FUROSEMIDE 40 MG PO TABS: 40.0000 mg | ORAL_TABLET | Freq: Every day | ORAL | Status: DC

## 2018-02-02 MED ORDER — PREDNISONE 50 MG PO TABS: ORAL_TABLET | ORAL | 0 refills | Status: DC

## 2018-02-02 MED ORDER — METOPROLOL SUCCINATE ER 25 MG PO TB24: 12.5000 mg | ORAL_TABLET | Freq: Every day | ORAL | 6 refills | Status: DC

## 2018-02-02 MED ORDER — LISINOPRIL 2.5 MG PO TABS: 2.5000 mg | ORAL_TABLET | Freq: Every day | ORAL | 6 refills | Status: DC

## 2018-02-02 NOTE — Telephone Encounter (Signed)
Pre-cert Verification for the following procedure   Left & right heart cath - Wednesday, 6/26 12:30 - [name]

## 2018-02-02 NOTE — Progress Notes (Signed)
Clinical Summary Alexander Parks is a 67 y.o.male seen today for follow up of the following medical problems.   1. Chronic systolic HF.  - pcp note from 01/10/18 reports recent leg edema, SOB.  - CXR 01/04/18 with pulm edema - 12/2017 echo LVEF 35-40%, mild to mod MR   - weight down 240 down to 225 lbs after lasix changed to 40mg  bid.  - no recent SOB/DOE,no LE edema.      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 Allergy   . Diabetes mellitus without complication (HCC)      Allergies  Allergen Reactions  . Benadryl [Diphenhydramine Hcl] Swelling  . Ivp Dye [Iodinated Diagnostic Agents]   . Morphine And Related      Current Outpatient Medications  Medication Sig Dispense Refill  . Aspirin-Salicylamide-Caffeine (BC HEADACHE POWDER PO) Take by mouth as needed.    . furosemide (LASIX) 40 MG tablet Take 40 mg by mouth daily.    Marland Kitchen lisinopril (PRINIVIL,ZESTRIL) 10 MG tablet Take 10 mg by mouth daily.    . metFORMIN (GLUCOPHAGE-XR) 500 MG 24 hr tablet Take 500 mg by mouth daily with breakfast. Take 2 tabs bid.     No current facility-administered medications for this visit.      Past Surgical History:  Procedure Laterality Date  . BALLOON DILATION  08/21/2012   Procedure: BALLOON DILATION;  Surgeon: Marissa Nestle, MD;  Location: [facility];  Service: Urology;  Laterality: Right;  Balloon Dilation Right Ureter  . CYSTOSCOPY W/ URETERAL STENT PLACEMENT  08/21/2012   Procedure: CYSTOSCOPY WITH RETROGRADE PYELOGRAM/URETERAL STENT PLACEMENT;  Surgeon: Marissa Nestle, MD;  Location: [facility];  Service: Urology;  Laterality: Right;  Cystoscopy with Right Retrograde Pyelogram, Right Ureteral Stent Placement  . HERNIA REPAIR    . HOLMIUM LASER APPLICATION  09/22/3149   Procedure: HOLMIUM LASER APPLICATION;  Surgeon: Marissa Nestle, MD;  Location: [facility];  Service: Urology;  Laterality: Right;  Holmium Laser Application to Right  Ureteral Calculus  . STONE EXTRACTION WITH BASKET  08/21/2012   Procedure: STONE EXTRACTION WITH BASKET;  Surgeon: Marissa Nestle, MD;  Location: [facility];  Service: Urology;  Laterality: Right;  Right Ureteral Stone Basket Extraction     Allergies  Allergen Reactions  . Benadryl [Diphenhydramine Hcl] Swelling  . Ivp Dye [Iodinated Diagnostic Agents]   . Morphine And Related       Family History  Problem Relation Age of Onset  . Cancer Mother   . Cancer Sister   . Liver disease Brother      Social History Alexander Parks reports that he quit smoking about 21 years ago. His smoking use included cigarettes. He has a 30.00 pack-year smoking history. He has never used smokeless tobacco. Alexander Parks reports that he does not drink alcohol.   Review of Systems CONSTITUTIONAL: No weight loss, fever, chills, weakness or fatigue.  HEENT: Eyes: No visual loss, blurred vision, double vision or yellow sclerae.No hearing loss, sneezing, congestion, runny nose or sore throat.  SKIN: No rash or itching.  CARDIOVASCULAR: perhpi RESPIRATORY:per hpi GASTROINTESTINAL: No anorexia, nausea, vomiting or diarrhea. No abdominal pain or blood.  GENITOURINARY: No burning on urination, no polyuria NEUROLOGICAL: No headache, dizziness, syncope, paralysis, ataxia, numbness or tingling in the extremities. No change in bowel or bladder control.  MUSCULOSKELETAL: No muscle, back pain, joint pain or stiffness.  LYMPHATICS: No enlarged nodes. No history  of splenectomy.  PSYCHIATRIC: No history of depression or anxiety.  ENDOCRINOLOGIC: No reports of sweating, cold or heat intolerance. No polyuria or polydipsia.  Marland Kitchen   Physical Examination Vitals:   02/02/18 1556  BP: (!) 85/50  Pulse: 95  SpO2: 96%   Filed Weights   02/02/18 1556  Weight: 229 lb (103.9 kg)    Gen: resting comfortably, no acute distress HEENT: no scleral icterus, pupils equal round and reactive, no palptable cervical adenopathy,  CV:  RRR, no mr/g. No jvd Resp: Clear to auscultation bilaterally GI: abdomen is soft, non-tender, non-distended, normal bowel sounds, no hepatosplenomegaly MSK: extremities are warm, no edema.  Skin: warm, no rash Neuro:  no focal deficits Psych: appropriate affect   Diagnostic Studies 12/2017 echo Study Conclusions  - Left ventricle: The cavity size was normal. Wall thickness was   normal. Systolic function was moderately reduced. The estimated   ejection fraction was in the range of 35% to 40%. Diffuse   hypokinesis. The study is not technically sufficient to allow   evaluation of LV diastolic function. - Aortic valve: Mildly calcified annulus. Trileaflet; mildly   thickened leaflets. There was mild regurgitation. Valve area   (VTI): 2.49 cm^2. Valve area (Vmax): 2.43 cm^2. Valve area   (Vmean): 2.09 cm^2. - Mitral valve: There was mild to moderate regurgitation. - Left atrium: The atrium was moderately dilated. - Right ventricle: The cavity size was mildly to moderately   dilated. Systolic function was mildly reduced. - Right atrium: The atrium was mildly dilated. - Pulmonary arteries: Systolic pressure was mildly increased. PA   peak pressure: 34 mm Hg (S). - Pericardium, extracardiac: There is a left pleural effusion.    Assessment and Plan  1. Chronic systolic HF - recent diagnosis, LVEF 35-40%. - soft bp's, significant weight loss. We will lower his lasix to 40mg  daily. Repeat labs - lower lisinopril to 2.5mg  daily to allow room to start TOprol XL 12.5mg  daily - plan for LHC/RHC to evalute for ischemic CM - dye allergy and benadryl allergy, will arrange the rest of the prep for him.     I have reviewed the risks, indications, and alternatives to cardiac catheterization, possible angioplasty, and stenting with the patient and his wife today. Risks include but are not limited to bleeding, infection, vascular injury, stroke, myocardial infection, arrhythmia, kidney injury,  radiation-related injury in the case of prolonged fluoroscopy use, emergency cardiac surgery, and death. The patient understands the risks of serious complication is 1-2 in 1937 with diagnostic cardiac cath and 1-2% or less with angioplasty/stenting.     F/u 3 weeks    Arnoldo Lenis, M.D.

## 2018-02-02 NOTE — Patient Instructions (Addendum)
Medication Instructions:   Decrease Lisinopril to 2.5mg  daily.  Toprol XL 12.5mg  daily.   Decrease Lasix to 40mg  daily, may take an extra tab as needed.   Continue all other medications.    Labwork: CBC, BMET - orders given today.  Testing/Procedures: Your physician has requested that you have a cardiac catheterization. Cardiac catheterization is used to diagnose and/or treat various heart conditions. Doctors may recommend this procedure for a number of different reasons. The most common reason is to evaluate chest pain. Chest pain can be a symptom of coronary artery disease (CAD), and cardiac catheterization can show whether plaque is narrowing or blocking your heart's arteries. This procedure is also used to evaluate the valves, as well as measure the blood flow and oxygen levels in different parts of your heart. For further information please visit [email or web address]. Please follow instruction sheet, as given.  Follow-Up: 3 weeks   Any Other Special Instructions Will Be Listed Below (If Applicable).  If you need a refill on your cardiac medications before your next appointment, please call your pharmacy.

## 2018-02-02 NOTE — H&P (View-Only) (Signed)
Clinical Summary Mr. Desa is a 67 y.o.male seen today for follow up of the following medical problems.   1. Chronic systolic HF.  - pcp note from 01/10/18 reports recent leg edema, SOB.  - CXR 01/04/18 with pulm edema - 12/2017 echo LVEF 35-40%, mild to mod MR   - weight down 240 down to 225 lbs after lasix changed to 40mg  bid.  - no recent SOB/DOE,no LE edema.      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 Allergy   . Diabetes mellitus without complication (HCC)      Allergies  Allergen Reactions  . Benadryl [Diphenhydramine Hcl] Swelling  . Ivp Dye [Iodinated Diagnostic Agents]   . Morphine And Related      Current Outpatient Medications  Medication Sig Dispense Refill  . Aspirin-Salicylamide-Caffeine (BC HEADACHE POWDER PO) Take by mouth as needed.    . furosemide (LASIX) 40 MG tablet Take 40 mg by mouth daily.    Marland Kitchen lisinopril (PRINIVIL,ZESTRIL) 10 MG tablet Take 10 mg by mouth daily.    . metFORMIN (GLUCOPHAGE-XR) 500 MG 24 hr tablet Take 500 mg by mouth daily with breakfast. Take 2 tabs bid.     No current facility-administered medications for this visit.      Past Surgical History:  Procedure Laterality Date  . BALLOON DILATION  08/21/2012   Procedure: BALLOON DILATION;  Surgeon: Marissa Nestle, MD;  Location: [facility];  Service: Urology;  Laterality: Right;  Balloon Dilation Right Ureter  . CYSTOSCOPY W/ URETERAL STENT PLACEMENT  08/21/2012   Procedure: CYSTOSCOPY WITH RETROGRADE PYELOGRAM/URETERAL STENT PLACEMENT;  Surgeon: Marissa Nestle, MD;  Location: [facility];  Service: Urology;  Laterality: Right;  Cystoscopy with Right Retrograde Pyelogram, Right Ureteral Stent Placement  . HERNIA REPAIR    . HOLMIUM LASER APPLICATION  08/18/9700   Procedure: HOLMIUM LASER APPLICATION;  Surgeon: Marissa Nestle, MD;  Location: [facility];  Service: Urology;  Laterality: Right;  Holmium Laser Application to Right  Ureteral Calculus  . STONE EXTRACTION WITH BASKET  08/21/2012   Procedure: STONE EXTRACTION WITH BASKET;  Surgeon: Marissa Nestle, MD;  Location: [facility];  Service: Urology;  Laterality: Right;  Right Ureteral Stone Basket Extraction     Allergies  Allergen Reactions  . Benadryl [Diphenhydramine Hcl] Swelling  . Ivp Dye [Iodinated Diagnostic Agents]   . Morphine And Related       Family History  Problem Relation Age of Onset  . Cancer Mother   . Cancer Sister   . Liver disease Brother      Social History Mr. Mccarthy reports that he quit smoking about 21 years ago. His smoking use included cigarettes. He has a 30.00 pack-year smoking history. He has never used smokeless tobacco. Mr. Siwek reports that he does not drink alcohol.   Review of Systems CONSTITUTIONAL: No weight loss, fever, chills, weakness or fatigue.  HEENT: Eyes: No visual loss, blurred vision, double vision or yellow sclerae.No hearing loss, sneezing, congestion, runny nose or sore throat.  SKIN: No rash or itching.  CARDIOVASCULAR: perhpi RESPIRATORY:per hpi GASTROINTESTINAL: No anorexia, nausea, vomiting or diarrhea. No abdominal pain or blood.  GENITOURINARY: No burning on urination, no polyuria NEUROLOGICAL: No headache, dizziness, syncope, paralysis, ataxia, numbness or tingling in the extremities. No change in bowel or bladder control.  MUSCULOSKELETAL: No muscle, back pain, joint pain or stiffness.  LYMPHATICS: No enlarged nodes. No history  of splenectomy.  PSYCHIATRIC: No history of depression or anxiety.  ENDOCRINOLOGIC: No reports of sweating, cold or heat intolerance. No polyuria or polydipsia.  Marland Kitchen   Physical Examination Vitals:   02/02/18 1556  BP: (!) 85/50  Pulse: 95  SpO2: 96%   Filed Weights   02/02/18 1556  Weight: 229 lb (103.9 kg)    Gen: resting comfortably, no acute distress HEENT: no scleral icterus, pupils equal round and reactive, no palptable cervical adenopathy,  CV:  RRR, no mr/g. No jvd Resp: Clear to auscultation bilaterally GI: abdomen is soft, non-tender, non-distended, normal bowel sounds, no hepatosplenomegaly MSK: extremities are warm, no edema.  Skin: warm, no rash Neuro:  no focal deficits Psych: appropriate affect   Diagnostic Studies 12/2017 echo Study Conclusions  - Left ventricle: The cavity size was normal. Wall thickness was   normal. Systolic function was moderately reduced. The estimated   ejection fraction was in the range of 35% to 40%. Diffuse   hypokinesis. The study is not technically sufficient to allow   evaluation of LV diastolic function. - Aortic valve: Mildly calcified annulus. Trileaflet; mildly   thickened leaflets. There was mild regurgitation. Valve area   (VTI): 2.49 cm^2. Valve area (Vmax): 2.43 cm^2. Valve area   (Vmean): 2.09 cm^2. - Mitral valve: There was mild to moderate regurgitation. - Left atrium: The atrium was moderately dilated. - Right ventricle: The cavity size was mildly to moderately   dilated. Systolic function was mildly reduced. - Right atrium: The atrium was mildly dilated. - Pulmonary arteries: Systolic pressure was mildly increased. PA   peak pressure: 34 mm Hg (S). - Pericardium, extracardiac: There is a left pleural effusion.    Assessment and Plan  1. Chronic systolic HF - recent diagnosis, LVEF 35-40%. - soft bp's, significant weight loss. We will lower his lasix to 40mg  daily. Repeat labs - lower lisinopril to 2.5mg  daily to allow room to start TOprol XL 12.5mg  daily - plan for LHC/RHC to evalute for ischemic CM - dye allergy and benadryl allergy, will arrange the rest of the prep for him.     I have reviewed the risks, indications, and alternatives to cardiac catheterization, possible angioplasty, and stenting with the patient and his wife today. Risks include but are not limited to bleeding, infection, vascular injury, stroke, myocardial infection, arrhythmia, kidney injury,  radiation-related injury in the case of prolonged fluoroscopy use, emergency cardiac surgery, and death. The patient understands the risks of serious complication is 1-2 in 2641 with diagnostic cardiac cath and 1-2% or less with angioplasty/stenting.     F/u 3 weeks    Arnoldo Lenis, M.D.

## 2018-02-03 ENCOUNTER — Encounter: Payer: Self-pay | Admitting: Cardiology

## 2018-02-05 DIAGNOSIS — R0602 Shortness of breath: Secondary | ICD-10-CM | POA: Diagnosis not present

## 2018-02-05 DIAGNOSIS — I5021 Acute systolic (congestive) heart failure: Secondary | ICD-10-CM | POA: Diagnosis not present

## 2018-02-05 DIAGNOSIS — Z01812 Encounter for preprocedural laboratory examination: Secondary | ICD-10-CM | POA: Diagnosis not present

## 2018-02-05 LAB — CBC
HCT: 44.9 % (ref 38.5–50.0)
HEMOGLOBIN: 14.8 g/dL (ref 13.2–17.1)
MCH: 28.6 pg (ref 27.0–33.0)
MCHC: 33 g/dL (ref 32.0–36.0)
MCV: 86.7 fL (ref 80.0–100.0)
MPV: 13.3 fL — ABNORMAL HIGH (ref 7.5–12.5)
Platelets: 147 10*3/uL (ref 140–400)
RBC: 5.18 10*6/uL (ref 4.20–5.80)
RDW: 13.5 % (ref 11.0–15.0)
WBC: 9.8 10*3/uL (ref 3.8–10.8)

## 2018-02-05 LAB — BASIC METABOLIC PANEL
BUN/Creatinine Ratio: 29 (calc) — ABNORMAL HIGH (ref 6–22)
BUN: 38 mg/dL — AB (ref 7–25)
CALCIUM: 10.1 mg/dL (ref 8.6–10.3)
CO2: 29 mmol/L (ref 20–32)
Chloride: 99 mmol/L (ref 98–110)
Creat: 1.32 mg/dL — ABNORMAL HIGH (ref 0.70–1.25)
GLUCOSE: 174 mg/dL — AB (ref 65–139)
Potassium: 4.9 mmol/L (ref 3.5–5.3)
Sodium: 137 mmol/L (ref 135–146)

## 2018-02-06 ENCOUNTER — Telehealth: Payer: Self-pay | Admitting: *Deleted

## 2018-02-06 NOTE — Telephone Encounter (Signed)
Pt contacted pre-catheterization scheduled at [facility] for: Wednesday February 07, 2018 12:30 PM Verified arrival time and place: [place] Entrance A at: 10:30 AM  No solid food after midnight prior to cath, clear liquids until 5 AM day of procedure. Verified allergies in Epic  Hold: Metformin 02/07/18 and 48 hours post procedure Furosemide AM of procedure  AM meds can be  taken pre-cath with sip of water including: ASA 81 mg Prednisone 50 mg  Confirmed patient has responsible person to drive home post procedure and observe patient for 24 hours: yes  Confirmed Benadryl allergy-pt states causes his lips to swell.  Confirmed contrast allergy with patient-he has been instructed to take Prednisone 50 mg 11:30 PM 02/06/18, Prednisone 50mg  5:30 AM 02/07/18, Prednisone 50 mg prior to leaving for hospital 02/07/18.

## 2018-02-06 NOTE — Telephone Encounter (Signed)
-----   Message from Arnoldo Lenis, MD sent at 02/05/2018  2:55 PM EDT ----- Labs show he was likely a little dehydrated, should improve since we cut back on his diuertic last visit  J BrancH MD

## 2018-02-06 NOTE — Telephone Encounter (Signed)
Pt aware - routed to pcp  

## 2018-02-07 ENCOUNTER — Inpatient Hospital Stay (HOSPITAL_COMMUNITY)
Admission: AD | Admit: 2018-02-07 | Discharge: 2018-02-13 | DRG: 234 | Disposition: A | Discharge status: Home / Self Care | Payer: Medicare HMO | Attending: Cardiothoracic Surgery | Admitting: Cardiothoracic Surgery

## 2018-02-07 ENCOUNTER — Other Ambulatory Visit: Payer: Self-pay

## 2018-02-07 ENCOUNTER — Other Ambulatory Visit: Payer: Self-pay | Admitting: *Deleted

## 2018-02-07 ENCOUNTER — Inpatient Hospital Stay (HOSPITAL_COMMUNITY): Payer: Medicare HMO

## 2018-02-07 ENCOUNTER — Encounter (HOSPITAL_COMMUNITY): Payer: Self-pay

## 2018-02-07 ENCOUNTER — Inpatient Hospital Stay (HOSPITAL_COMMUNITY)
Admission: AD | Disposition: A | Discharge status: Home / Self Care | Payer: Self-pay | Source: Home / Self Care | Attending: Cardiothoracic Surgery

## 2018-02-07 DIAGNOSIS — I2510 Atherosclerotic heart disease of native coronary artery without angina pectoris: Secondary | ICD-10-CM | POA: Diagnosis present

## 2018-02-07 DIAGNOSIS — I440 Atrioventricular block, first degree: Secondary | ICD-10-CM | POA: Diagnosis present

## 2018-02-07 DIAGNOSIS — R000 Tachycardia, unspecified: Secondary | ICD-10-CM | POA: Diagnosis not present

## 2018-02-07 DIAGNOSIS — I4510 Unspecified right bundle-branch block: Secondary | ICD-10-CM | POA: Diagnosis present

## 2018-02-07 DIAGNOSIS — E1122 Type 2 diabetes mellitus with diabetic chronic kidney disease: Secondary | ICD-10-CM | POA: Diagnosis present

## 2018-02-07 DIAGNOSIS — D62 Acute posthemorrhagic anemia: Secondary | ICD-10-CM | POA: Diagnosis not present

## 2018-02-07 DIAGNOSIS — J90 Pleural effusion, not elsewhere classified: Secondary | ICD-10-CM | POA: Diagnosis not present

## 2018-02-07 DIAGNOSIS — Z7984 Long term (current) use of oral hypoglycemic drugs: Secondary | ICD-10-CM | POA: Diagnosis not present

## 2018-02-07 DIAGNOSIS — N182 Chronic kidney disease, stage 2 (mild): Secondary | ICD-10-CM | POA: Diagnosis present

## 2018-02-07 DIAGNOSIS — Z79899 Other long term (current) drug therapy: Secondary | ICD-10-CM

## 2018-02-07 DIAGNOSIS — Z01810 Encounter for preprocedural cardiovascular examination: Secondary | ICD-10-CM | POA: Diagnosis not present

## 2018-02-07 DIAGNOSIS — Z888 Allergy status to other drugs, medicaments and biological substances status: Secondary | ICD-10-CM | POA: Diagnosis not present

## 2018-02-07 DIAGNOSIS — Z09 Encounter for follow-up examination after completed treatment for conditions other than malignant neoplasm: Secondary | ICD-10-CM

## 2018-02-07 DIAGNOSIS — E669 Obesity, unspecified: Secondary | ICD-10-CM | POA: Diagnosis present

## 2018-02-07 DIAGNOSIS — I25110 Atherosclerotic heart disease of native coronary artery with unstable angina pectoris: Secondary | ICD-10-CM | POA: Diagnosis not present

## 2018-02-07 DIAGNOSIS — D696 Thrombocytopenia, unspecified: Secondary | ICD-10-CM | POA: Diagnosis not present

## 2018-02-07 DIAGNOSIS — I25810 Atherosclerosis of coronary artery bypass graft(s) without angina pectoris: Secondary | ICD-10-CM | POA: Diagnosis not present

## 2018-02-07 DIAGNOSIS — Z91041 Radiographic dye allergy status: Secondary | ICD-10-CM | POA: Diagnosis not present

## 2018-02-07 DIAGNOSIS — R0989 Other specified symptoms and signs involving the circulatory and respiratory systems: Secondary | ICD-10-CM | POA: Diagnosis not present

## 2018-02-07 DIAGNOSIS — Z885 Allergy status to narcotic agent status: Secondary | ICD-10-CM | POA: Diagnosis not present

## 2018-02-07 DIAGNOSIS — I255 Ischemic cardiomyopathy: Secondary | ICD-10-CM | POA: Diagnosis not present

## 2018-02-07 DIAGNOSIS — I5021 Acute systolic (congestive) heart failure: Secondary | ICD-10-CM | POA: Diagnosis not present

## 2018-02-07 DIAGNOSIS — Z4682 Encounter for fitting and adjustment of non-vascular catheter: Secondary | ICD-10-CM | POA: Diagnosis not present

## 2018-02-07 DIAGNOSIS — I5022 Chronic systolic (congestive) heart failure: Secondary | ICD-10-CM | POA: Diagnosis not present

## 2018-02-07 DIAGNOSIS — Z6832 Body mass index (BMI) 32.0-32.9, adult: Secondary | ICD-10-CM

## 2018-02-07 DIAGNOSIS — Z951 Presence of aortocoronary bypass graft: Secondary | ICD-10-CM

## 2018-02-07 DIAGNOSIS — E119 Type 2 diabetes mellitus without complications: Secondary | ICD-10-CM | POA: Diagnosis not present

## 2018-02-07 DIAGNOSIS — Z9689 Presence of other specified functional implants: Secondary | ICD-10-CM

## 2018-02-07 DIAGNOSIS — J9811 Atelectasis: Secondary | ICD-10-CM | POA: Diagnosis not present

## 2018-02-07 DIAGNOSIS — I083 Combined rheumatic disorders of mitral, aortic and tricuspid valves: Secondary | ICD-10-CM | POA: Diagnosis not present

## 2018-02-07 HISTORY — PX: RIGHT/LEFT HEART CATH AND CORONARY ANGIOGRAPHY: CATH118266

## 2018-02-07 LAB — POCT I-STAT 3, VENOUS BLOOD GAS (G3P V)
ACID-BASE DEFICIT: 7 mmol/L — AB (ref 0.0–2.0)
Bicarbonate: 18.2 mmol/L — ABNORMAL LOW (ref 20.0–28.0)
O2 SAT: 71 %
PCO2 VEN: 34.3 mmHg — AB (ref 44.0–60.0)
TCO2: 19 mmol/L — AB (ref 22–32)
pH, Ven: 7.333 (ref 7.250–7.430)
pO2, Ven: 39 mmHg (ref 32.0–45.0)

## 2018-02-07 LAB — ABO/RH: ABO/RH(D): B NEG

## 2018-02-07 LAB — COMPREHENSIVE METABOLIC PANEL
ALT: 26 U/L (ref 0–44)
AST: 27 U/L (ref 15–41)
Albumin: 3.5 g/dL (ref 3.5–5.0)
Alkaline Phosphatase: 54 U/L (ref 38–126)
Anion gap: 13 (ref 5–15)
BUN: 30 mg/dL — ABNORMAL HIGH (ref 8–23)
CO2: 18 mmol/L — ABNORMAL LOW (ref 22–32)
Calcium: 8.8 mg/dL — ABNORMAL LOW (ref 8.9–10.3)
Chloride: 103 mmol/L (ref 98–111)
Creatinine, Ser: 1.29 mg/dL — ABNORMAL HIGH (ref 0.61–1.24)
GFR calc Af Amer: >60 mL/min (ref >60)
GFR calc non Af Amer: 56 mL/min — ABNORMAL LOW (ref >60)
Glucose, Bld: 319 mg/dL — ABNORMAL HIGH (ref 70–99)
Potassium: 4.8 mmol/L (ref 3.5–5.1)
Sodium: 134 mmol/L — ABNORMAL LOW (ref 135–145)
Total Bilirubin: 1.3 mg/dL — ABNORMAL HIGH (ref 0.3–1.2)
Total Protein: 7.4 g/dL (ref 6.5–8.1)

## 2018-02-07 LAB — HEMOGLOBIN A1C
Hgb A1c MFr Bld: 9.5 % — ABNORMAL HIGH (ref 4.8–5.6)
Mean Plasma Glucose: 225.95 mg/dL

## 2018-02-07 LAB — POCT I-STAT 3, ART BLOOD GAS (G3+)
ACID-BASE DEFICIT: 7 mmol/L — AB (ref 0.0–2.0)
BICARBONATE: 17 mmol/L — AB (ref 20.0–28.0)
O2 Saturation: 96 %
PH ART: 7.355 (ref 7.350–7.450)
TCO2: 18 mmol/L — AB (ref 22–32)
pCO2 arterial: 30.5 mmHg — ABNORMAL LOW (ref 32.0–48.0)
pO2, Arterial: 82 mmHg — ABNORMAL LOW (ref 83.0–108.0)

## 2018-02-07 LAB — PROTIME-INR
INR: 1.22
Prothrombin Time: 15.3 seconds — ABNORMAL HIGH (ref 11.4–15.2)

## 2018-02-07 LAB — TYPE AND SCREEN
ABO/RH(D): B NEG
Antibody Screen: NEGATIVE

## 2018-02-07 LAB — GLUCOSE, CAPILLARY: GLUCOSE-CAPILLARY: 250 mg/dL — AB (ref 70–99)

## 2018-02-07 SURGERY — RIGHT/LEFT HEART CATH AND CORONARY ANGIOGRAPHY
Anesthesia: LOCAL

## 2018-02-07 MED ORDER — PLASMA-LYTE 148 IV SOLN
INTRAVENOUS | Status: DC
  Filled 2018-02-07 (×2): qty 2.5

## 2018-02-07 MED ORDER — LIDOCAINE HCL (PF) 1 % IJ SOLN
INTRAMUSCULAR | Status: DC | PRN
  Administered 2018-02-07: 2 mL
  Administered 2018-02-07: 3 mL

## 2018-02-07 MED ORDER — MIDAZOLAM HCL 2 MG/2ML IJ SOLN
INTRAMUSCULAR | Status: AC
Start: 2018-02-07 — End: ?
  Filled 2018-02-07: qty 2

## 2018-02-07 MED ORDER — CHLORHEXIDINE GLUCONATE CLOTH 2 % EX PADS
6.0000 | MEDICATED_PAD | Freq: Once | CUTANEOUS | Status: AC
  Administered 2018-02-07: 6 via TOPICAL

## 2018-02-07 MED ORDER — VERAPAMIL HCL 2.5 MG/ML IV SOLN
INTRAVENOUS | Status: AC
  Filled 2018-02-07: qty 2

## 2018-02-07 MED ORDER — TEMAZEPAM 15 MG PO CAPS: 15.0000 mg | ORAL_CAPSULE | Freq: Once | ORAL | Status: DC | PRN

## 2018-02-07 MED ORDER — LIDOCAINE HCL (PF) 1 % IJ SOLN
INTRAMUSCULAR | Status: AC
  Filled 2018-02-07: qty 30

## 2018-02-07 MED ORDER — DEXMEDETOMIDINE HCL IN NACL 400 MCG/100ML IV SOLN
0.1000 ug/kg/h | INTRAVENOUS | Status: DC
Start: 2018-02-08 — End: 2018-02-08
  Filled 2018-02-07 (×2): qty 100

## 2018-02-07 MED ORDER — SODIUM CHLORIDE 0.9 % IV SOLN
INTRAVENOUS | Status: AC
  Administered 2018-02-08: 1.8 [IU]/h via INTRAVENOUS
  Filled 2018-02-07: qty 1

## 2018-02-07 MED ORDER — SODIUM CHLORIDE 0.9 % IV SOLN
250.0000 mL | INTRAVENOUS | Status: DC | PRN
  Administered 2018-02-08: 10:00:00 via INTRAVENOUS

## 2018-02-07 MED ORDER — FUROSEMIDE 40 MG PO TABS: 40.0000 mg | ORAL_TABLET | Freq: Every day | ORAL | Status: DC

## 2018-02-07 MED ORDER — SODIUM CHLORIDE 0.9 % IV SOLN
INTRAVENOUS | Status: AC
  Administered 2018-02-07: 17:00:00 via INTRAVENOUS

## 2018-02-07 MED ORDER — HEPARIN (PORCINE) IN NACL 100-0.45 UNIT/ML-% IJ SOLN
1400.0000 [IU]/h | INTRAMUSCULAR | Status: DC
  Administered 2018-02-07: 1400 [IU]/h via INTRAVENOUS
  Filled 2018-02-07: qty 250

## 2018-02-07 MED ORDER — FENTANYL CITRATE (PF) 100 MCG/2ML IJ SOLN
INTRAMUSCULAR | Status: DC | PRN
  Administered 2018-02-07: 25 ug via INTRAVENOUS

## 2018-02-07 MED ORDER — HEPARIN (PORCINE) IN NACL 1000-0.9 UT/500ML-% IV SOLN
INTRAVENOUS | Status: AC
  Filled 2018-02-07: qty 1000

## 2018-02-07 MED ORDER — SODIUM CHLORIDE 0.9 % IV SOLN
INTRAVENOUS | Status: DC
  Filled 2018-02-07 (×2): qty 30

## 2018-02-07 MED ORDER — LISINOPRIL 5 MG PO TABS: 2.5000 mg | ORAL_TABLET | Freq: Every day | ORAL | Status: DC

## 2018-02-07 MED ORDER — PNEUMOCOCCAL VAC POLYVALENT 25 MCG/0.5ML IJ INJ
0.5000 mL | INJECTION | INTRAMUSCULAR | Status: DC
  Filled 2018-02-07: qty 0.5

## 2018-02-07 MED ORDER — VANCOMYCIN HCL 10 G IV SOLR
1250.0000 mg | INTRAVENOUS | Status: AC
  Administered 2018-02-08: 1250 mg via INTRAVENOUS
  Filled 2018-02-07: qty 1250

## 2018-02-07 MED ORDER — SODIUM CHLORIDE 0.9 % IV SOLN
INTRAVENOUS | Status: DC
  Administered 2018-02-07: 11:00:00 via INTRAVENOUS

## 2018-02-07 MED ORDER — CEFUROXIME SODIUM 1.5 G IV SOLR
1.5000 g | INTRAVENOUS | Status: AC
  Administered 2018-02-08: .75 g via INTRAVENOUS
  Administered 2018-02-08: 1.5 g via INTRAVENOUS
  Filled 2018-02-07: qty 1.5

## 2018-02-07 MED ORDER — ONDANSETRON HCL 4 MG/2ML IJ SOLN: 4.0000 mg | Freq: Four times a day (QID) | INTRAMUSCULAR | Status: DC | PRN

## 2018-02-07 MED ORDER — MAGNESIUM SULFATE 50 % IJ SOLN
40.0000 meq | INTRAMUSCULAR | Status: DC
  Filled 2018-02-07 (×2): qty 9.85

## 2018-02-07 MED ORDER — SODIUM CHLORIDE 0.9% FLUSH: 3.0000 mL | INTRAVENOUS | Status: DC | PRN

## 2018-02-07 MED ORDER — CHLORHEXIDINE GLUCONATE CLOTH 2 % EX PADS
6.0000 | MEDICATED_PAD | Freq: Once | CUTANEOUS | Status: AC
  Administered 2018-02-08: 6 via TOPICAL

## 2018-02-07 MED ORDER — HEPARIN (PORCINE) IN NACL 2-0.9 UNITS/ML
INTRAMUSCULAR | Status: DC | PRN
  Administered 2018-02-07: 10 mL via INTRA_ARTERIAL

## 2018-02-07 MED ORDER — POTASSIUM CHLORIDE 2 MEQ/ML IV SOLN
80.0000 meq | INTRAVENOUS | Status: DC
  Filled 2018-02-07: qty 40

## 2018-02-07 MED ORDER — IOHEXOL 350 MG/ML SOLN
INTRAVENOUS | Status: DC | PRN
  Administered 2018-02-07: 55 mL via INTRACARDIAC

## 2018-02-07 MED ORDER — MILRINONE LACTATE IN DEXTROSE 20-5 MG/100ML-% IV SOLN
0.1250 ug/kg/min | INTRAVENOUS | Status: DC
  Filled 2018-02-07: qty 100

## 2018-02-07 MED ORDER — ACETAMINOPHEN 325 MG PO TABS: 650.0000 mg | ORAL_TABLET | ORAL | Status: DC | PRN

## 2018-02-07 MED ORDER — FENTANYL CITRATE (PF) 100 MCG/2ML IJ SOLN
INTRAMUSCULAR | Status: AC
  Filled 2018-02-07: qty 2

## 2018-02-07 MED ORDER — FAMOTIDINE IN NACL 20-0.9 MG/50ML-% IV SOLN
INTRAVENOUS | Status: AC | PRN
  Administered 2018-02-07: 20 mg via INTRAVENOUS

## 2018-02-07 MED ORDER — SODIUM CHLORIDE 0.9 % IV SOLN
750.0000 mg | INTRAVENOUS | Status: DC
  Filled 2018-02-07: qty 750

## 2018-02-07 MED ORDER — METOPROLOL SUCCINATE ER 25 MG PO TB24: 25.0000 mg | ORAL_TABLET | Freq: Every day | ORAL | Status: DC

## 2018-02-07 MED ORDER — TRANEXAMIC ACID 1000 MG/10ML IV SOLN
1.5000 mg/kg/h | INTRAVENOUS | Status: DC
  Filled 2018-02-07: qty 25

## 2018-02-07 MED ORDER — DOPAMINE-DEXTROSE 3.2-5 MG/ML-% IV SOLN
0.0000 ug/kg/min | INTRAVENOUS | Status: DC
Start: 2018-02-08 — End: 2018-02-08
  Filled 2018-02-07 (×2): qty 250

## 2018-02-07 MED ORDER — DEXTROSE 5 % IV SOLN
0.0000 ug/min | INTRAVENOUS | Status: DC
  Filled 2018-02-07: qty 4

## 2018-02-07 MED ORDER — NITROGLYCERIN IN D5W 200-5 MCG/ML-% IV SOLN
2.0000 ug/min | INTRAVENOUS | Status: AC
  Administered 2018-02-08: 30 ug/min via INTRAVENOUS
  Filled 2018-02-07: qty 250

## 2018-02-07 MED ORDER — HEPARIN (PORCINE) IN NACL 2-0.9 UNITS/ML
INTRAMUSCULAR | Status: AC | PRN
  Administered 2018-02-07 (×2): 500 mL

## 2018-02-07 MED ORDER — SODIUM CHLORIDE 0.9% FLUSH: 3.0000 mL | Freq: Two times a day (BID) | INTRAVENOUS | Status: DC

## 2018-02-07 MED ORDER — ASPIRIN 81 MG PO CHEW: 81.0000 mg | CHEWABLE_TABLET | ORAL | Status: DC

## 2018-02-07 MED ORDER — ATORVASTATIN CALCIUM 80 MG PO TABS
80.0000 mg | ORAL_TABLET | Freq: Every day | ORAL | Status: DC
  Administered 2018-02-07 – 2018-02-12 (×5): 80 mg via ORAL
  Filled 2018-02-07 (×5): qty 1

## 2018-02-07 MED ORDER — SODIUM CHLORIDE 0.9 % IV SOLN: 250.0000 mL | INTRAVENOUS | Status: DC | PRN

## 2018-02-07 MED ORDER — TRANEXAMIC ACID (OHS) BOLUS VIA INFUSION
15.0000 mg/kg | INTRAVENOUS | Status: AC
Start: 2018-02-08 — End: 2018-02-08
  Administered 2018-02-08: 1531.5 mg via INTRAVENOUS
  Filled 2018-02-07: qty 1532

## 2018-02-07 MED ORDER — SODIUM CHLORIDE 0.9 % IV SOLN
30.0000 ug/min | INTRAVENOUS | Status: AC
  Administered 2018-02-08: 50 ug/min via INTRAVENOUS
  Filled 2018-02-07: qty 20

## 2018-02-07 MED ORDER — MIDAZOLAM HCL 2 MG/2ML IJ SOLN
INTRAMUSCULAR | Status: DC | PRN
  Administered 2018-02-07: 1 mg via INTRAVENOUS

## 2018-02-07 MED ORDER — FAMOTIDINE IN NACL 20-0.9 MG/50ML-% IV SOLN
INTRAVENOUS | Status: AC
  Filled 2018-02-07: qty 50

## 2018-02-07 MED ORDER — TRANEXAMIC ACID (OHS) PUMP PRIME SOLUTION
2.0000 mg/kg | INTRAVENOUS | Status: DC
  Filled 2018-02-07: qty 2.04

## 2018-02-07 MED ORDER — HEPARIN SODIUM (PORCINE) 1000 UNIT/ML IJ SOLN
INTRAMUSCULAR | Status: DC | PRN
  Administered 2018-02-07: 5000 [IU] via INTRAVENOUS

## 2018-02-07 MED ORDER — METOPROLOL TARTRATE 12.5 MG HALF TABLET
12.5000 mg | ORAL_TABLET | Freq: Once | ORAL | Status: AC
  Administered 2018-02-08: 12.5 mg via ORAL
  Filled 2018-02-07: qty 1

## 2018-02-07 MED ORDER — CHLORHEXIDINE GLUCONATE 0.12 % MT SOLN
15.0000 mL | Freq: Once | OROMUCOSAL | Status: AC
  Administered 2018-02-08: 15 mL via OROMUCOSAL
  Filled 2018-02-07: qty 15

## 2018-02-07 MED ORDER — BISACODYL 5 MG PO TBEC
5.0000 mg | DELAYED_RELEASE_TABLET | Freq: Once | ORAL | Status: AC
  Administered 2018-02-07: 5 mg via ORAL
  Filled 2018-02-07: qty 1

## 2018-02-07 SURGICAL SUPPLY — 11 items

## 2018-02-07 NOTE — Interval H&P Note (Signed)
History and Physical Interval Note:  02/07/2018 2:40 PM  Alexander Parks  has presented today for cardiac cath with the diagnosis of cardiomyopathy, CHF. The various methods of treatment have been discussed with the patient and family. After consideration of risks, benefits and other options for treatment, the patient has consented to  Procedure(s): RIGHT/LEFT HEART CATH AND CORONARY ANGIOGRAPHY (N/A) as a surgical intervention .  The patient's history has been reviewed, patient examined, no change in status, stable for surgery.  I have reviewed the patient's chart and labs.  Questions were answered to the patient's satisfaction.    Cath Lab Visit (complete for each Cath Lab visit)  Clinical Evaluation Leading to the Procedure:   ACS: No.  Non-ACS:    Anginal Classification: CCS II  Anti-ischemic medical therapy: Minimal Therapy (1 class of medications)  Non-Invasive Test Results: No non-invasive testing performed  Prior CABG: No previous CABG         Lauree Chandler

## 2018-02-07 NOTE — Consult Note (Signed)
[address]       [place] [address]             [phone]        Alexander Parks [facility] Medical Record #[id number] Date of Birth: 02-12-1951  Referring: No ref. 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  His symptoms have progressed to the point where he is unable to even pump gas into his truck, or walk across a parking lot without becoming short of breath.  Echocardiogram in May showed decreased LV function with ejection fraction of 35 to 40%.  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  Patient came in today for elective cardiac catheterization and found to have high-grade left main and three-vessel coronary artery disease.  He has known diabetes, renal insufficiency most likely related to his long-term diabetes, quit smoking 15 years ago   Current Activity/ Functional Status: Patient is independent with mobility/ambulation, transfers, ADL's, IADL's.   Zubrod Score: At the time of surgery this patient's most appropriate activity status/level should be described as: []     0    Normal activity, no symptoms [x]     1    Restricted in physical strenuous activity but ambulatory, able to do out light work []     2    Ambulatory and capable of self care, unable to do work activities, up and about                 more than 50%  Of the time                            []     3    Only limited self care, in bed greater than 50% of waking hours []     4    Completely disabled, no self care, confined to bed or chair []     5    Moribund  Past Medical History:  Diagnosis Date  . Allergy   . Diabetes mellitus without complication [facility]     Past Surgical  History:  Procedure Laterality Date  . BALLOON DILATION  08/21/2012   Procedure: BALLOON DILATION;  Surgeon: Marissa Nestle, MD;  Location: [facility];  Service: Urology;  Laterality: Right;  Balloon Dilation Right Ureter  . CYSTOSCOPY W/ URETERAL STENT PLACEMENT  08/21/2012   Procedure: CYSTOSCOPY WITH RETROGRADE PYELOGRAM/URETERAL STENT PLACEMENT;  Surgeon: Marissa Nestle, MD;  Location: [facility];  Service: Urology;  Laterality: Right;  Cystoscopy with Right Retrograde Pyelogram, Right Ureteral Stent Placement  . HERNIA REPAIR    . HOLMIUM LASER APPLICATION  8/0/9983   Procedure: HOLMIUM LASER APPLICATION;  Surgeon: Marissa Nestle, MD;  Location: [facility];  Service: Urology;  Laterality: Right;  Holmium Laser Application to Right Ureteral Calculus  . STONE EXTRACTION WITH BASKET  08/21/2012   Procedure: STONE EXTRACTION WITH BASKET;  Surgeon: Marissa Nestle, MD;  Location: [facility];  Service: Urology;  Laterality: Right;  Right Ureteral Stone Basket Extraction    Social History   Tobacco Use  Smoking Status Former Smoker  . Packs/day: 2.00  . Years: 15.00  .  Pack years: 30.00  . Types: Cigarettes  . Last attempt to quit: 08/16/1996  . Years since quitting: 21.4  Smokeless Tobacco Never Used    Social History   Substance and Sexual Activity  Alcohol Use No     Allergies  Allergen Reactions  . Benadryl [Diphenhydramine Hcl] Swelling  . Iodinated Diagnostic Agents Other (See Comments)    Unknown  . Morphine And Related Other (See Comments)    Unknown    Current Facility-Administered Medications  Medication Dose Route Frequency Provider Last Rate Last Dose  . 0.9 %  sodium chloride infusion   Intravenous Continuous Lauree Chandler D, MD      . 0.9 %  sodium chloride infusion  250 mL Intravenous PRN Burnell Blanks, MD      . acetaminophen (TYLENOL) tablet 650 mg  650 mg Oral Q4H PRN Burnell Blanks, MD      . atorvastatin (LIPITOR) tablet 80 mg  80 mg Oral  q1800 Burnell Blanks, MD      . Derrill Memo ON 02/08/2018] furosemide (LASIX) tablet 40 mg  40 mg Oral Daily Burnell Blanks, MD      . Derrill Memo ON 02/08/2018] lisinopril (PRINIVIL,ZESTRIL) tablet 2.5 mg  2.5 mg Oral Daily Burnell Blanks, MD      . Derrill Memo ON 02/08/2018] metoprolol succinate (TOPROL-XL) 24 hr tablet 25 mg  25 mg Oral Daily Burnell Blanks, MD      . ondansetron [facility]) injection 4 mg  4 mg Intravenous Q6H PRN Burnell Blanks, MD      . Derrill Memo ON 02/08/2018] pneumococcal 23 valent vaccine (PNU-IMMUNE) injection 0.5 mL  0.5 mL Intramuscular Tomorrow-1000 Lauree Chandler D, MD      . sodium chloride flush (NS) 0.9 % injection 3 mL  3 mL Intravenous Q12H Lauree Chandler D, MD      . sodium chloride flush (NS) 0.9 % injection 3 mL  3 mL Intravenous PRN Burnell Blanks, MD        Medications Prior to Admission  Medication Sig Dispense Refill Last Dose  . Aspirin-Salicylamide-Caffeine (BC HEADACHE POWDER PO) Take 1 packet by mouth daily as needed (for pain or headache).    02/06/2018 at Unknown time  . furosemide (LASIX) 40 MG tablet Take 1 tablet (40 mg total) by mouth daily. (may take one extra tab as needed)   02/06/2018 at Unknown time  . lisinopril (PRINIVIL,ZESTRIL) 2.5 MG tablet Take 1 tablet (2.5 mg total) by mouth daily. 30 tablet 6 02/06/2018 at Unknown time  . metFORMIN (GLUCOPHAGE-XR) 500 MG 24 hr tablet Take 1,000 mg by mouth 2 (two) times daily.    02/06/2018 at 1200  . metoprolol succinate (TOPROL XL) 25 MG 24 hr tablet Take 0.5 tablets (12.5 mg total) by mouth daily. 15 tablet 6 02/06/2018 at Unknown time  . predniSONE (DELTASONE) 50 MG tablet Take as directed prior to heart cath (Patient taking differently: Take 50 mg by mouth See admin instructions. Take as directed prior to heart cath) 3 tablet 0 02/07/2018 at 1000    Family History  Problem Relation Age of Onset  . Cancer Mother   . Cancer Sister   . Liver disease  Brother      Review of Systems:  Pertinent items are noted in HPI.     Cardiac Review of Systems: Y or  [    ]= no  Chest Pain [ n   ]  Resting SOB [ y  ] Exertional  SOB  Blue.Reese  ]  Orthopnea Blue.Reese  ]   Pedal Edema [ y  ]    Palpitations [ n ] Syncope  Florencio.Farrier  ]   Presyncope Florencio.Farrier   ]  General Review of Systems: [Y] = yes [  ]=no Constitional: recent weight change Blue.Reese  ]; anorexia [  ]; fatigue Blue.Reese  ]; nausea [  ]; night sweats [  ]; fever [  ]; or chills [  ]                                                               Dental: Last Dentist visit:   Eye : blurred vision [  ]; diplopia [   ]; vision changes [  ];  Amaurosis fugax[  ]; Resp: cough [  ];  wheezing[  ];  hemoptysis[  ]; shortness of breath[  ]; paroxysmal nocturnal dyspnea[  ]; dyspnea on exertion[  ]; or orthopnea[  ];  GI:  gallstones[  ], vomiting[  ];  dysphagia[  ]; melena[  ];  hematochezia [  ]; heartburn[  ];   Hx of  Colonoscopy[  ]; GU: kidney stones Blue.Reese  ]; hematuria[  ];   dysuria [ y ];  nocturia[  ];  history of     obstruction [  ]; urinary frequency Blue.Reese  ]             Skin: rash, swelling[  ];, hair loss[  ];  peripheral edema[  ];  or itching[  ]; Musculosketetal: myalgias[  ];  joint swelling[  ];  joint erythema[  ];  joint pain[  ];  back pain[  ];  Heme/Lymph: bruising[  ];  bleeding[  ];  anemia[  ];  Neuro: TIA[  ];  headaches[  ];  stroke[  ];  vertigo[  ];  seizures[  ];   paresthesias[  ];  difficulty walking[  ];  Psych:depression[  ]; anxiety[  ];  Endocrine: diabetes[ y ];  thyroid dysfunction[  ];              Physical Exam: BP 110/72   Pulse 90   Temp 97.6 F (36.4 C) (Oral)   Resp (!) 0   Ht 5\' 11"  (1.803 m)   Wt 225 lb (102.1 kg)   SpO2 (!) 0%   BMI 31.38 kg/m    General appearance: alert, cooperative and moderately obese Head: Normocephalic, without obvious abnormality, atraumatic Neck: no adenopathy, no carotid bruit, no JVD, supple, symmetrical, trachea midline and thyroid not enlarged,  symmetric, no tenderness/mass/nodules Lymph nodes: Cervical, supraclavicular, and axillary nodes normal. Resp: clear to auscultation bilaterally Cardio: regular rate and rhythm, S1, S2 normal, no murmur, click, rub or gallop GI: soft, non-tender; bowel sounds normal; no masses,  no organomegaly Extremities: extremities normal, atraumatic, no cyanosis or edema, Homans sign is negative, no sign of DVT and Superficial varicosities in the lower leg bilaterally, palpable DP and PT pulses bilaterally Neurologic: Grossly normal  Diagnostic Studies & Laboratory data:     Recent Radiology Findings:  Last chest x-ray was in March with evidence of congestive heart failure.   Recent Lab Findings: Lab Results  Component Value Date   WBC 9.8 02/05/2018   HGB 14.8 02/05/2018  HCT 44.9 02/05/2018   PLT 147 02/05/2018   GLUCOSE 174 (H) 02/05/2018   NA 137 02/05/2018   K 4.9 02/05/2018   CL 99 02/05/2018   CREATININE 1.32 (H) 02/05/2018   BUN 38 (H) 02/05/2018   CO2 29 02/05/2018   HGBA1C 8.8 (H) 04/29/2014   Chronic Kidney Disease   Stage I     GFR >90  Stage II    GFR 60-89  Stage IIIA GFR 45-59  Stage IIIB GFR 30-44  Stage IV   GFR 15-29  Stage V    GFR  <15  Lab Results  Component Value Date   CREATININE 1.32 (H) 02/05/2018   Estimated Creatinine Clearance: 66.1 mL/min (A) (by C-G formula based on SCr of 1.32 mg/dL (H)).  Cardiac Cath: Conclusion     Prox RCA lesion is 99% stenosed.  Post Atrio lesion is 99% stenosed.  Ost RPDA lesion is 50% stenosed.  Mid RCA lesion is 20% stenosed.  Ost LM to Mid LM lesion is 90% stenosed.  Mid LM to Dist LM lesion is 60% stenosed.  Prox Cx to Mid Cx lesion is 99% stenosed.  Prox LAD lesion is 99% stenosed.  Ost 1st Diag lesion is 80% stenosed.  Ost LAD to Prox LAD lesion is 30% stenosed.   1. Severe triple vessel CAD  2. Severe ostial left main stenosis 3. Severe stenosis mid LAD and in the small to moderate caliber  diagonal branch 4. Severe stenosis in the mid Circumflex leading into the large obtuse marginal branch 5. Severe stenosis in the proximal segment of the large dominant RCA. Severe stenosis in the posterolateral branch.  6. LVEF 35-40% by echo  Recommendations: Will admit to telemetry unit and consult CT surgery for CABG. He is having no chest pain or dyspnea. Will start ASA and high intensity statin. Continue beta blocker, Ace-inh. Will start IV heparin 6 hours post sheath pull.    Hemo Data    Most Recent Value  Fick Cardiac Output 6.08 L/min  Fick Cardiac Output Index 2.64 (L/min)/BSA  RA A Wave 7 mmHg  RA V Wave 4 mmHg  RA Mean 3 mmHg  RV Systolic Pressure 31 mmHg  RV Diastolic Pressure 1 mmHg  RV EDP 3 mmHg  PA Systolic Pressure 33 mmHg  PA Diastolic Pressure 17 mmHg  PA Mean 24 mmHg  PW A Wave 14 mmHg  PW V Wave 11 mmHg  PW Mean 10 mmHg  AO Systolic Pressure 97 mmHg  AO Diastolic Pressure 60 mmHg  AO Mean 77 mmHg  LV Systolic Pressure 637 mmHg  LV Diastolic Pressure 9 mmHg  LV EDP 20 mmHg  Arterial Occlusion Pressure Extended Systolic Pressure 858 mmHg  Arterial Occlusion Pressure Extended Diastolic Pressure 62 mmHg  Arterial Occlusion Pressure Extended Mean Pressure 82 mmHg  Left Ventricular Apex Extended Systolic Pressure 850 mmHg  Left Ventricular Apex Extended Diastolic Pressure 9 mmHg  Left Ventricular Apex Extended EDP Pressure 21 mmHg  QP/QS 1  TPVR Index 9.07 HRUI  TSVR Index 29.14 HRUI  PVR SVR Ratio 0.05  TPVR/TSVR Ratio 0.31    I have independently reviewed the above  cath films and reviewed the findings with the  patient .    ECHO Jan 11 2018: Patient:    Shafer, Swamy MR #:       [id number] Study Date: 01/11/2018 Gender:     M Age:        75 Height:     188  cm Weight:     110.2 kg BSA:        2.43 m^2 Pt. Status: Room:   SONOGRAPHER  [facility]  ATTENDING    Kerry Hough, M.D.  Berna Spare, M.D.  REFERRING     Kerry Hough, M.D.  PERFORMING   [facility] [place]  cc:  ------------------------------------------------------------------- LV EF: 35% -   40%  ------------------------------------------------------------------- History:   PMH:   Dyspnea and bilateral lower extremity edema. Congestive heart failure. RBBB. Pneumonia.  Risk factors:  Former tobacco use. Diabetes mellitus. Obese.  ------------------------------------------------------------------- Study Conclusions  - Left ventricle: The cavity size was normal. Wall thickness was   normal. Systolic function was moderately reduced. The estimated   ejection fraction was in the range of 35% to 40%. Diffuse   hypokinesis. The study is not technically sufficient to allow   evaluation of LV diastolic function. - Aortic valve: Mildly calcified annulus. Trileaflet; mildly   thickened leaflets. There was mild regurgitation. Valve area   (VTI): 2.49 cm^2. Valve area (Vmax): 2.43 cm^2. Valve area   (Vmean): 2.09 cm^2. - Mitral valve: There was mild to moderate regurgitation. - Left atrium: The atrium was moderately dilated. - Right ventricle: The cavity size was mildly to moderately   dilated. Systolic function was mildly reduced. - Right atrium: The atrium was mildly dilated. - Pulmonary arteries: Systolic pressure was mildly increased. PA   peak pressure: 34 mm Hg (S). - Pericardium, extracardiac: There is a left pleural effusion.  ------------------------------------------------------------------- Study data:  No prior study was available for comparison.  Study status:  Routine.  Procedure:  Transthoracic echocardiography. Image quality was adequate.  Study completion:  There were no complications.          Transthoracic echocardiography.  M-mode, complete 2D, spectral Doppler, and color Doppler.  Birthdate: Patient birthdate: Jan 13, 1951.  Age:  Patient is 67 yr old.  Sex: Gender: male.    BMI: 31.2 kg/m^2.  Blood pressure:      103/69 Patient status:  Outpatient.  Study date:  Study date: 01/11/2018. Study time: 10:51 AM.  -------------------------------------------------------------------  ------------------------------------------------------------------- Left ventricle:  The cavity size was normal. Wall thickness was normal. Systolic function was moderately reduced. The estimated ejection fraction was in the range of 35% to 40%. Diffuse hypokinesis. The study is not technically sufficient to allow evaluation of LV diastolic function.  ------------------------------------------------------------------- Aortic valve:   Mildly calcified annulus. Trileaflet; mildly thickened leaflets.  Doppler:   There was no stenosis.   There was mild regurgitation.    VTI ratio of LVOT to aortic valve: 0.72. Valve area (VTI): 2.49 cm^2. Indexed valve area (VTI): 1.03 cm^2/m^2. Peak velocity ratio of LVOT to aortic valve: 0.7. Valve area (Vmax): 2.43 cm^2. Indexed valve area (Vmax): 1 cm^2/m^2. Mean velocity ratio of LVOT to aortic valve: 0.6. Valve area (Vmean): 2.09 cm^2. Indexed valve area (Vmean): 0.86 cm^2/m^2.    Mean gradient (S): 3 mm Hg. Peak gradient (S): 5 mm Hg.  ------------------------------------------------------------------- Aorta:  Aortic root: The aortic root was normal in size.  ------------------------------------------------------------------- Mitral valve:   Normal thickness leaflets .  Doppler:   There was no evidence for stenosis.   There was mild to moderate regurgitation.    Peak gradient (D): 2 mm Hg.  ------------------------------------------------------------------- Left atrium:  The atrium was moderately dilated.  ------------------------------------------------------------------- Atrial septum:  No defect or patent foramen ovale was identified.   ------------------------------------------------------------------- Right ventricle:  The cavity size was mildly to moderately  dilated. Systolic function was mildly reduced.  ------------------------------------------------------------------- Pulmonic valve:   Not well visualized.  Doppler:   There was no evidence for stenosis.   There was no significant regurgitation.   ------------------------------------------------------------------- Tricuspid valve:   Normal thickness leaflets.  Doppler:   There was no evidence for stenosis.   There was mild regurgitation.  ------------------------------------------------------------------- Pulmonary artery:   Systolic pressure was mildly increased.  ------------------------------------------------------------------- Right atrium:  The atrium was mildly dilated.  ------------------------------------------------------------------- Pericardium:  There is a left pleural effusion. There was no pericardial effusion.  ------------------------------------------------------------------- Systemic veins:  IVC is dilated, normal respiratory variation. Estimated RA pressure is 8 mmHg.  ------------------------------------------------------------------- Measurements   Left ventricle                            Value          Reference  LV ID, ED, PLAX chordal                   45    mm       43 - 52  LV ID, ES, PLAX chordal           (H)     40.9  mm       23 - 38  LV fx shortening, PLAX chordal    (L)     9     %        >=29  LV PW thickness, ED                       9     mm       ---------  IVS/LV PW ratio, ED                       0.96           <=1.3    Ventricular septum                        Value          Reference  IVS thickness, ED                         8.6   mm       ---------    LVOT                                      Value          Reference  LVOT ID, S                                21    mm       ---------  LVOT area                                 3.46  cm^2     ---------  LVOT peak velocity, S                     79.3  cm/s     ---------  LVOT  mean velocity, S  47.7  cm/s     ---------  LVOT VTI, S                               12.6  cm       ---------    Aortic valve                              Value          Reference  Aortic valve peak velocity, S             113   cm/s     ---------  Aortic valve mean velocity, S             78.9  cm/s     ---------  Aortic valve VTI, S                       17.5  cm       ---------  Aortic mean gradient, S                   3     mm Hg    ---------  Aortic peak gradient, S                   5     mm Hg    ---------  VTI ratio, LVOT/AV                        0.72           ---------  Aortic valve area, VTI                    2.49  cm^2     ---------  Aortic valve area/bsa, VTI                1.03  cm^2/m^2 ---------  Velocity ratio, peak, LVOT/AV             0.7            ---------  Aortic valve area, peak velocity          2.43  cm^2     ---------  Aortic valve area/bsa, peak               1     cm^2/m^2 ---------  velocity  Velocity ratio, mean, LVOT/AV             0.6            ---------  Aortic valve area, mean velocity          2.09  cm^2     ---------  Aortic valve area/bsa, mean               0.86  cm^2/m^2 ---------  velocity  Aortic regurg pressure half-time          579   ms       ---------    Aorta                                     Value          Reference  Aortic root ID, ED  36    mm       ---------    Left atrium                               Value          Reference  LA ID, A-P, ES                            42    mm       ---------  LA volume/bsa, ES, 1-p A4C                34.6  ml/m^2   ---------    Mitral valve                              Value          Reference  Mitral E-wave peak velocity               72.8  cm/s     ---------  Mitral A-wave peak velocity               114   cm/s     ---------  Mitral deceleration time          (L)     61    ms       150 - 230  Mitral peak gradient, D                   2     mm Hg     ---------  Mitral E/A ratio, peak                    0.6            ---------  Mitral maximal regurg velocity,           378   cm/s     ---------  PISA    Pulmonary arteries                        Value          Reference  PA pressure, S, DP                (H)     34    mm Hg    <=30    Right ventricle                           Value          Reference  TAPSE                                     14    mm       ---------  Legend: (L)  and  (H)  mark values outside specified reference range.  ------------------------------------------------------------------- Prepared and Electronically Authenticated by  Kerry Hough, M.D. 2019-05-30T15:33:01  Assessment / Plan:   1/severe three-vessel coronary artery disease including significant left main obstruction-with the patient's symptoms of progressive congestive heart failure and severe three-vessel coronary artery disease including left main his best treatment option includes coronary artery bypass grafting on this admission.  Risks and options  are discussed with the patient and his wife in detail.  At this time trying to make arrangements to proceed with surgery tomorrow morning.  The goals risks and alternatives of the planned surgical procedure   have been discussed with the patient in detail. The risks of the procedure including death, infection, stroke, myocardial infarction, bleeding, blood transfusion have all been discussed specifically.  I have quoted Thayer Jew a 3% of perioperative mortality and a complication rate as high as 30 %. The patient's questions have been answered.Thayer Jew is willing  to proceed with the planned procedure.  2/The estimated ejection fraction was in the range of 35% to 40%. Diffuse hypokinesis.  3/ Stage II    GFR 60-89 CKD 4/long-standing diabetes with complications related to coronary artery disease and chronic renal insufficiency  Grace Isaac MD      [address] [place] [address] Office [phone]   Beeper [phone]  02/07/2018 5:33 PM

## 2018-02-07 NOTE — Progress Notes (Signed)
ANTICOAGULATION CONSULT NOTE - Initial Consult  Pharmacy Consult for heparin Indication: chest pain/ACS  Allergies  Allergen Reactions  . Benadryl [Diphenhydramine Hcl] Swelling  . Iodinated Diagnostic Agents Other (See Comments)    Unknown  . Morphine And Related Other (See Comments)    Unknown    Patient Measurements: Height: 5\' 11"  (180.3 cm) Weight: 225 lb (102.1 kg) IBW/kg (Calculated) : 75.3 Heparin Dosing Weight: 96kg  Vital Signs: Temp: 97.6 F (36.4 C) (06/26 1036) Temp Source: Oral (06/26 1036) BP: 110/77 (06/26 1800) Pulse Rate: 94 (06/26 1800)  Labs: Recent Labs    02/05/18 0911  HGB 14.8  HCT 44.9  PLT 147  CREATININE 1.32*    Estimated Creatinine Clearance: 66.1 mL/min (A) (by C-G formula based on SCr of 1.32 mg/dL (H)).   Medical History: Past Medical History:  Diagnosis Date  . Allergy   . Diabetes mellitus without complication [facility])    Assessment: 67 year old male with newly diagnosed severe three vessel CAD including left main found on cath this afternoon. CVTS is planning surgery tomorrow morning. Orders to start IV heparin this evening and continue until on call to surgery.    Goal of Therapy:  Heparin level 0.3-0.7 units/ml Monitor platelets by anticoagulation protocol: Yes   Plan:  Start heparin infusion at 1400 units/hr Check anti-Xa level in 6 hours and daily while on heparin Continue to monitor H&H and platelets  Erin Hearing PharmD., BCPS Clinical Pharmacist 02/07/2018 6:11 PM

## 2018-02-07 NOTE — Progress Notes (Signed)
Pre-CABG testing has been completed. 1-39% ICA stenosis bilaterally.  ABI's Right 1.13 Left 1.08  Palmar waveforms Right Unable to assess due to TR band. Left Palmar waveforms are obliterated with radial and ulnar compression.  02/07/18 5:58 PM Alexander Parks RVT

## 2018-02-07 NOTE — Research (Signed)
CADFEM Informed Consent   Subject Name: Alexander Parks  Subject met inclusion and exclusion criteria.  The informed consent form, study requirements and expectations were reviewed with the subject and questions and concerns were addressed prior to the signing of the consent form.  The subject verbalized understanding of the trail requirements.  The subject agreed to participate in the CADFEM trial and signed the informed consent.  The informed consent was obtained prior to performance of any protocol-specific procedures for the subject.  A copy of the signed informed consent was given to the subject and a copy was placed in the subject's medical record.  Neva Seat 02/07/2018, 11:35 AM

## 2018-02-08 ENCOUNTER — Inpatient Hospital Stay (HOSPITAL_COMMUNITY)
Admission: AD | Disposition: A | Discharge status: Home / Self Care | Payer: Self-pay | Source: Home / Self Care | Attending: Cardiothoracic Surgery

## 2018-02-08 ENCOUNTER — Inpatient Hospital Stay (HOSPITAL_COMMUNITY): Payer: Medicare HMO | Admitting: Anesthesiology

## 2018-02-08 ENCOUNTER — Inpatient Hospital Stay (HOSPITAL_COMMUNITY): Payer: Medicare HMO

## 2018-02-08 ENCOUNTER — Encounter (HOSPITAL_COMMUNITY): Payer: Self-pay | Admitting: Cardiovascular Disease

## 2018-02-08 DIAGNOSIS — I25110 Atherosclerotic heart disease of native coronary artery with unstable angina pectoris: Secondary | ICD-10-CM

## 2018-02-08 DIAGNOSIS — Z951 Presence of aortocoronary bypass graft: Secondary | ICD-10-CM

## 2018-02-08 HISTORY — PX: INTRAOPERATIVE TRANSESOPHAGEAL ECHOCARDIOGRAM: SHX5062

## 2018-02-08 HISTORY — PX: CORONARY ARTERY BYPASS GRAFT: SHX141

## 2018-02-08 LAB — POCT I-STAT, CHEM 8
BUN: 28 mg/dL — AB (ref 8–23)
BUN: 29 mg/dL — AB (ref 8–23)
BUN: 27 mg/dL — ABNORMAL HIGH (ref 8–23)
BUN: 26 mg/dL — ABNORMAL HIGH (ref 8–23)
BUN: 27 mg/dL — ABNORMAL HIGH (ref 8–23)
BUN: 26 mg/dL — ABNORMAL HIGH (ref 8–23)
BUN: 23 mg/dL (ref 8–23)
CALCIUM ION: 1.27 mmol/L (ref 1.15–1.40)
CALCIUM ION: 1.1 mmol/L — AB (ref 1.15–1.40)
CALCIUM ION: 1.17 mmol/L (ref 1.15–1.40)
CHLORIDE: 106 mmol/L (ref 98–111)
CHLORIDE: 105 mmol/L (ref 98–111)
CHLORIDE: 106 mmol/L (ref 98–111)
CREATININE: 0.9 mg/dL (ref 0.61–1.24)
CREATININE: 0.9 mg/dL (ref 0.61–1.24)
CREATININE: 1 mg/dL (ref 0.61–1.24)
Calcium, Ion: 1.25 mmol/L (ref 1.15–1.40)
Calcium, Ion: 1.14 mmol/L — ABNORMAL LOW (ref 1.15–1.40)
Calcium, Ion: 1.14 mmol/L — ABNORMAL LOW (ref 1.15–1.40)
Calcium, Ion: 1.49 mmol/L — ABNORMAL HIGH (ref 1.15–1.40)
Chloride: 105 mmol/L (ref 98–111)
Chloride: 102 mmol/L (ref 98–111)
Chloride: 103 mmol/L (ref 98–111)
Chloride: 104 mmol/L (ref 98–111)
Creatinine, Ser: 1 mg/dL (ref 0.61–1.24)
Creatinine, Ser: 0.9 mg/dL (ref 0.61–1.24)
Creatinine, Ser: 1 mg/dL (ref 0.61–1.24)
Creatinine, Ser: 1 mg/dL (ref 0.61–1.24)
GLUCOSE: 161 mg/dL — AB (ref 70–99)
GLUCOSE: 146 mg/dL — AB (ref 70–99)
GLUCOSE: 163 mg/dL — AB (ref 70–99)
Glucose, Bld: 149 mg/dL — ABNORMAL HIGH (ref 70–99)
Glucose, Bld: 153 mg/dL — ABNORMAL HIGH (ref 70–99)
Glucose, Bld: 165 mg/dL — ABNORMAL HIGH (ref 70–99)
Glucose, Bld: 204 mg/dL — ABNORMAL HIGH (ref 70–99)
HCT: 37 % — ABNORMAL LOW (ref 39.0–52.0)
HCT: 38 % — ABNORMAL LOW (ref 39.0–52.0)
HCT: 29 % — ABNORMAL LOW (ref 39.0–52.0)
HCT: 29 % — ABNORMAL LOW (ref 39.0–52.0)
HCT: 33 % — ABNORMAL LOW (ref 39.0–52.0)
HEMATOCRIT: 30 % — AB (ref 39.0–52.0)
HEMATOCRIT: 28 % — AB (ref 39.0–52.0)
HEMOGLOBIN: 12.9 g/dL — AB (ref 13.0–17.0)
HEMOGLOBIN: 10.2 g/dL — AB (ref 13.0–17.0)
HEMOGLOBIN: 9.9 g/dL — AB (ref 13.0–17.0)
Hemoglobin: 12.6 g/dL — ABNORMAL LOW (ref 13.0–17.0)
Hemoglobin: 9.9 g/dL — ABNORMAL LOW (ref 13.0–17.0)
Hemoglobin: 9.5 g/dL — ABNORMAL LOW (ref 13.0–17.0)
Hemoglobin: 11.2 g/dL — ABNORMAL LOW (ref 13.0–17.0)
POTASSIUM: 5.1 mmol/L (ref 3.5–5.1)
Potassium: 3.9 mmol/L (ref 3.5–5.1)
Potassium: 4.4 mmol/L (ref 3.5–5.1)
Potassium: 4.1 mmol/L (ref 3.5–5.1)
Potassium: 4.9 mmol/L (ref 3.5–5.1)
Potassium: 4.6 mmol/L (ref 3.5–5.1)
Potassium: 4.2 mmol/L (ref 3.5–5.1)
SODIUM: 140 mmol/L (ref 135–145)
SODIUM: 140 mmol/L (ref 135–145)
SODIUM: 140 mmol/L (ref 135–145)
SODIUM: 138 mmol/L (ref 135–145)
Sodium: 141 mmol/L (ref 135–145)
Sodium: 138 mmol/L (ref 135–145)
Sodium: 141 mmol/L (ref 135–145)
TCO2: 21 mmol/L — AB (ref 22–32)
TCO2: 22 mmol/L (ref 22–32)
TCO2: 25 mmol/L (ref 22–32)
TCO2: 22 mmol/L (ref 22–32)
TCO2: 25 mmol/L (ref 22–32)
TCO2: 21 mmol/L — AB (ref 22–32)
TCO2: 23 mmol/L (ref 22–32)

## 2018-02-08 LAB — CREATININE, SERUM
Creatinine, Ser: 1.1 mg/dL (ref 0.61–1.24)
GFR calc Af Amer: >60 mL/min (ref >60)
GFR calc non Af Amer: >60 mL/min (ref >60)

## 2018-02-08 LAB — POCT I-STAT 3, ART BLOOD GAS (G3+)
ACID-BASE DEFICIT: 2 mmol/L (ref 0.0–2.0)
ACID-BASE DEFICIT: 3 mmol/L — AB (ref 0.0–2.0)
Acid-base deficit: 5 mmol/L — ABNORMAL HIGH (ref 0.0–2.0)
Acid-base deficit: 5 mmol/L — ABNORMAL HIGH (ref 0.0–2.0)
Acid-base deficit: 2 mmol/L (ref 0.0–2.0)
BICARBONATE: 21.8 mmol/L (ref 20.0–28.0)
Bicarbonate: 24 mmol/L (ref 20.0–28.0)
Bicarbonate: 21.6 mmol/L (ref 20.0–28.0)
Bicarbonate: 22.5 mmol/L (ref 20.0–28.0)
Bicarbonate: 23.3 mmol/L (ref 20.0–28.0)
O2 SAT: 100 %
O2 SAT: 98 %
O2 Saturation: 100 %
O2 Saturation: 100 %
O2 Saturation: 99 %
PCO2 ART: 45.4 mmHg (ref 32.0–48.0)
PCO2 ART: 41.1 mmHg (ref 32.0–48.0)
PCO2 ART: 41.3 mmHg (ref 32.0–48.0)
PH ART: 7.283 — AB (ref 7.350–7.450)
PH ART: 7.358 (ref 7.350–7.450)
PO2 ART: 232 mmHg — AB (ref 83.0–108.0)
PO2 ART: 230 mmHg — AB (ref 83.0–108.0)
PO2 ART: 99 mmHg (ref 83.0–108.0)
Patient temperature: 35.8
Patient temperature: 35.6
Patient temperature: 36.2
TCO2: 25 mmol/L (ref 22–32)
TCO2: 23 mmol/L (ref 22–32)
TCO2: 23 mmol/L (ref 22–32)
TCO2: 24 mmol/L (ref 22–32)
TCO2: 25 mmol/L (ref 22–32)
pCO2 arterial: 47.6 mmHg (ref 32.0–48.0)
pCO2 arterial: 37.3 mmHg (ref 32.0–48.0)
pH, Arterial: 7.31 — ABNORMAL LOW (ref 7.350–7.450)
pH, Arterial: 7.323 — ABNORMAL LOW (ref 7.350–7.450)
pH, Arterial: 7.384 (ref 7.350–7.450)
pO2, Arterial: 283 mmHg — ABNORMAL HIGH (ref 83.0–108.0)
pO2, Arterial: 153 mmHg — ABNORMAL HIGH (ref 83.0–108.0)

## 2018-02-08 LAB — BASIC METABOLIC PANEL
Anion gap: 9 (ref 5–15)
BUN: 31 mg/dL — AB (ref 8–23)
CALCIUM: 9.2 mg/dL (ref 8.9–10.3)
CO2: 22 mmol/L (ref 22–32)
CREATININE: 1.16 mg/dL (ref 0.61–1.24)
Chloride: 106 mmol/L (ref 98–111)
GFR calc non Af Amer: >60 mL/min (ref >60)
GLUCOSE: 187 mg/dL — AB (ref 70–99)
Potassium: 4.2 mmol/L (ref 3.5–5.1)
Sodium: 137 mmol/L (ref 135–145)

## 2018-02-08 LAB — MAGNESIUM: Magnesium: 2.5 mg/dL — ABNORMAL HIGH (ref 1.7–2.4)

## 2018-02-08 LAB — CBC
HCT: 41.7 % (ref 39.0–52.0)
HCT: 36.8 % — ABNORMAL LOW (ref 39.0–52.0)
HCT: 34.8 % — ABNORMAL LOW (ref 39.0–52.0)
HEMOGLOBIN: 11.8 g/dL — AB (ref 13.0–17.0)
Hemoglobin: 13.8 g/dL (ref 13.0–17.0)
Hemoglobin: 11.2 g/dL — ABNORMAL LOW (ref 13.0–17.0)
MCH: 28.8 pg (ref 26.0–34.0)
MCH: 28.9 pg (ref 26.0–34.0)
MCH: 28.4 pg (ref 26.0–34.0)
MCHC: 33.1 g/dL (ref 30.0–36.0)
MCHC: 32.1 g/dL (ref 30.0–36.0)
MCHC: 32.2 g/dL (ref 30.0–36.0)
MCV: 87.1 fL (ref 78.0–100.0)
MCV: 90 fL (ref 78.0–100.0)
MCV: 88.1 fL (ref 78.0–100.0)
PLATELETS: 120 10*3/uL — AB (ref 150–400)
Platelets: 122 10*3/uL — ABNORMAL LOW (ref 150–400)
Platelets: 127 10*3/uL — ABNORMAL LOW (ref 150–400)
RBC: 4.79 MIL/uL (ref 4.22–5.81)
RBC: 4.09 MIL/uL — AB (ref 4.22–5.81)
RBC: 3.95 MIL/uL — ABNORMAL LOW (ref 4.22–5.81)
RDW: 13.5 % (ref 11.5–15.5)
RDW: 13.7 % (ref 11.5–15.5)
RDW: 13.6 % (ref 11.5–15.5)
WBC: 14.9 10*3/uL — ABNORMAL HIGH (ref 4.0–10.5)
WBC: 26.8 10*3/uL — ABNORMAL HIGH (ref 4.0–10.5)
WBC: 20.1 10*3/uL — ABNORMAL HIGH (ref 4.0–10.5)

## 2018-02-08 LAB — URINALYSIS, COMPLETE (UACMP) WITH MICROSCOPIC
Bacteria, UA: NONE SEEN
Bilirubin Urine: NEGATIVE
Glucose, UA: >=500 mg/dL — AB
Ketones, ur: NEGATIVE mg/dL
Leukocytes, UA: NEGATIVE
Nitrite: NEGATIVE
Protein, ur: NEGATIVE mg/dL
Specific Gravity, Urine: 1.022 (ref 1.005–1.030)
pH: 5 (ref 5.0–8.0)

## 2018-02-08 LAB — COOXEMETRY PANEL
Carboxyhemoglobin: 1 % (ref 0.5–1.5)
Methemoglobin: 1.4 % (ref 0.0–1.5)
O2 Saturation: 76.6 %
Total hemoglobin: 11.9 g/dL — ABNORMAL LOW (ref 12.0–16.0)

## 2018-02-08 LAB — BLOOD GAS, ARTERIAL
Acid-base deficit: 1.3 mmol/L (ref 0.0–2.0)
Acid-base deficit: 0.6 mmol/L (ref 0.0–2.0)
BICARBONATE: 22.4 mmol/L (ref 20.0–28.0)
Bicarbonate: 23.8 mmol/L (ref 20.0–28.0)
DRAWN BY: 270271
FIO2: 80
MECHVT: 750 mL
O2 Saturation: 96.5 %
O2 Saturation: 98.4 %
PCO2 ART: 34.3 mmHg (ref 32.0–48.0)
PEEP: 6 cmH2O
Patient temperature: 98.6
Patient temperature: 95.9
RATE: 14 resp/min
pCO2 arterial: 37.9 mmHg (ref 32.0–48.0)
pH, Arterial: 7.431 (ref 7.350–7.450)
pH, Arterial: 7.406 (ref 7.350–7.450)
pO2, Arterial: 83.7 mmHg (ref 83.0–108.0)
pO2, Arterial: 114 mmHg — ABNORMAL HIGH (ref 83.0–108.0)

## 2018-02-08 LAB — ECHO TEE: EF: 20 %

## 2018-02-08 LAB — SURGICAL PCR SCREEN
MRSA, PCR: NEGATIVE
Staphylococcus aureus: NEGATIVE

## 2018-02-08 LAB — APTT
aPTT: 98 seconds — ABNORMAL HIGH (ref 24–36)
aPTT: 30 seconds (ref 24–36)

## 2018-02-08 LAB — POCT I-STAT 4, (NA,K, GLUC, HGB,HCT)
Glucose, Bld: 180 mg/dL — ABNORMAL HIGH (ref 70–99)
HEMATOCRIT: 33 % — AB (ref 39.0–52.0)
HEMOGLOBIN: 11.2 g/dL — AB (ref 13.0–17.0)
Potassium: 4.1 mmol/L (ref 3.5–5.1)
Sodium: 141 mmol/L (ref 135–145)

## 2018-02-08 LAB — GLUCOSE, CAPILLARY
GLUCOSE-CAPILLARY: 165 mg/dL — AB (ref 70–99)
GLUCOSE-CAPILLARY: 171 mg/dL — AB (ref 70–99)
Glucose-Capillary: 158 mg/dL — ABNORMAL HIGH (ref 70–99)
Glucose-Capillary: 158 mg/dL — ABNORMAL HIGH (ref 70–99)
Glucose-Capillary: 171 mg/dL — ABNORMAL HIGH (ref 70–99)

## 2018-02-08 LAB — HEMOGLOBIN AND HEMATOCRIT, BLOOD
HCT: 30.4 % — ABNORMAL LOW (ref 39.0–52.0)
Hemoglobin: 10.1 g/dL — ABNORMAL LOW (ref 13.0–17.0)

## 2018-02-08 LAB — PROTIME-INR
INR: 1.54
PROTHROMBIN TIME: 18.4 s — AB (ref 11.4–15.2)

## 2018-02-08 LAB — PLATELET COUNT: Platelets: 102 10*3/uL — ABNORMAL LOW (ref 150–400)

## 2018-02-08 SURGERY — CORONARY ARTERY BYPASS GRAFTING (CABG)
Anesthesia: General | Site: Chest

## 2018-02-08 MED ORDER — BISACODYL 10 MG RE SUPP: 10.0000 mg | Freq: Every day | RECTAL | Status: DC

## 2018-02-08 MED ORDER — ASPIRIN 81 MG PO CHEW: 324.0000 mg | CHEWABLE_TABLET | Freq: Every day | ORAL | Status: DC

## 2018-02-08 MED ORDER — METOPROLOL TARTRATE 5 MG/5ML IV SOLN: 2.5000 mg | INTRAVENOUS | Status: DC | PRN

## 2018-02-08 MED ORDER — DOPAMINE-DEXTROSE 1.6-5 MG/ML-% IV SOLN
INTRAVENOUS | Status: DC | PRN
  Administered 2018-02-08: 3 ug/kg/min via INTRAVENOUS

## 2018-02-08 MED ORDER — MIDAZOLAM HCL 5 MG/5ML IJ SOLN
INTRAMUSCULAR | Status: DC | PRN
  Administered 2018-02-08 (×4): 2 mg via INTRAVENOUS

## 2018-02-08 MED ORDER — ASPIRIN EC 325 MG PO TBEC
325.0000 mg | DELAYED_RELEASE_TABLET | Freq: Every day | ORAL | Status: DC
  Administered 2018-02-09 – 2018-02-11 (×3): 325 mg via ORAL
  Filled 2018-02-08 (×3): qty 1

## 2018-02-08 MED ORDER — HEPARIN SODIUM (PORCINE) 1000 UNIT/ML IJ SOLN
INTRAMUSCULAR | Status: DC | PRN
  Administered 2018-02-08: 31000 [IU] via INTRAVENOUS

## 2018-02-08 MED ORDER — SODIUM CHLORIDE 0.9% FLUSH
3.0000 mL | Freq: Two times a day (BID) | INTRAVENOUS | Status: DC
  Administered 2018-02-10 (×2): 3 mL via INTRAVENOUS

## 2018-02-08 MED ORDER — MIDAZOLAM HCL 2 MG/2ML IJ SOLN
2.0000 mg | INTRAMUSCULAR | Status: DC | PRN
  Administered 2018-02-08: 2 mg via INTRAVENOUS
  Filled 2018-02-08: qty 2

## 2018-02-08 MED ORDER — ACETAMINOPHEN 160 MG/5ML PO SOLN: 650.0000 mg | Freq: Once | ORAL | Status: AC

## 2018-02-08 MED ORDER — ACETAMINOPHEN 650 MG RE SUPP
650.0000 mg | Freq: Once | RECTAL | Status: AC
  Administered 2018-02-08: 650 mg via RECTAL

## 2018-02-08 MED ORDER — NOREPINEPHRINE 4 MG/250ML-% IV SOLN
0.0000 ug/min | INTRAVENOUS | Status: DC
  Filled 2018-02-08: qty 250

## 2018-02-08 MED ORDER — METOPROLOL TARTRATE 12.5 MG HALF TABLET
12.5000 mg | ORAL_TABLET | Freq: Two times a day (BID) | ORAL | Status: DC
  Administered 2018-02-10 (×2): 12.5 mg via ORAL
  Filled 2018-02-08 (×3): qty 1

## 2018-02-08 MED ORDER — DOCUSATE SODIUM 100 MG PO CAPS
200.0000 mg | ORAL_CAPSULE | Freq: Every day | ORAL | Status: DC
  Administered 2018-02-09 – 2018-02-10 (×2): 200 mg via ORAL
  Filled 2018-02-08 (×2): qty 2

## 2018-02-08 MED ORDER — SODIUM CHLORIDE 0.9 % IV SOLN
1.5000 g | Freq: Two times a day (BID) | INTRAVENOUS | Status: AC
  Administered 2018-02-08 – 2018-02-10 (×4): 1.5 g via INTRAVENOUS
  Filled 2018-02-08 (×4): qty 1.5

## 2018-02-08 MED ORDER — LACTATED RINGERS IV SOLN: INTRAVENOUS | Status: DC

## 2018-02-08 MED ORDER — PHENYLEPHRINE HCL 10 MG/ML IJ SOLN
INTRAMUSCULAR | Status: DC | PRN
  Administered 2018-02-08: 40 ug via INTRAVENOUS

## 2018-02-08 MED ORDER — SODIUM CHLORIDE 0.9 % IV SOLN
0.0000 ug/min | INTRAVENOUS | Status: DC
  Filled 2018-02-08: qty 2

## 2018-02-08 MED ORDER — VANCOMYCIN HCL IN DEXTROSE 1-5 GM/200ML-% IV SOLN
1000.0000 mg | Freq: Once | INTRAVENOUS | Status: AC
  Administered 2018-02-08: 1000 mg via INTRAVENOUS
  Filled 2018-02-08 (×2): qty 200

## 2018-02-08 MED ORDER — ACETAMINOPHEN 160 MG/5ML PO SOLN
1000.0000 mg | Freq: Four times a day (QID) | ORAL | Status: DC
  Administered 2018-02-08: 1000 mg
  Filled 2018-02-08: qty 40.6

## 2018-02-08 MED ORDER — FENTANYL CITRATE (PF) 250 MCG/5ML IJ SOLN
INTRAMUSCULAR | Status: DC | PRN
  Administered 2018-02-08: 150 ug via INTRAVENOUS
  Administered 2018-02-08: 100 ug via INTRAVENOUS
  Administered 2018-02-08: 50 ug via INTRAVENOUS
  Administered 2018-02-08: 250 ug via INTRAVENOUS
  Administered 2018-02-08: 200 ug via INTRAVENOUS
  Administered 2018-02-08: 250 ug via INTRAVENOUS

## 2018-02-08 MED ORDER — SODIUM CHLORIDE 0.9 % IV SOLN: 250.0000 mL | INTRAVENOUS | Status: DC

## 2018-02-08 MED ORDER — PROPOFOL 10 MG/ML IV BOLUS
INTRAVENOUS | Status: DC | PRN
  Administered 2018-02-08: 20 mg via INTRAVENOUS
  Administered 2018-02-08: 30 mg via INTRAVENOUS
  Administered 2018-02-08: 20 mg via INTRAVENOUS
  Administered 2018-02-08: 30 mg via INTRAVENOUS
  Administered 2018-02-08: 20 mg via INTRAVENOUS

## 2018-02-08 MED ORDER — SODIUM CHLORIDE 0.45 % IV SOLN
INTRAVENOUS | Status: DC | PRN
  Administered 2018-02-08: 17:00:00 via INTRAVENOUS

## 2018-02-08 MED ORDER — MAGNESIUM SULFATE 4 GM/100ML IV SOLN
4.0000 g | Freq: Once | INTRAVENOUS | Status: AC
  Administered 2018-02-08: 4 g via INTRAVENOUS
  Filled 2018-02-08: qty 100

## 2018-02-08 MED ORDER — SODIUM CHLORIDE 0.9 % IV SOLN
INTRAVENOUS | Status: DC
  Administered 2018-02-09: 9.3 [IU]/h via INTRAVENOUS
  Filled 2018-02-08: qty 1

## 2018-02-08 MED ORDER — 0.9 % SODIUM CHLORIDE (POUR BTL) OPTIME
TOPICAL | Status: DC | PRN
  Administered 2018-02-08: 5000 mL

## 2018-02-08 MED ORDER — CALCIUM CHLORIDE 10 % IV SOLN
INTRAVENOUS | Status: DC | PRN
  Administered 2018-02-08: 500 mg via INTRAVENOUS

## 2018-02-08 MED ORDER — ORAL CARE MOUTH RINSE
15.0000 mL | OROMUCOSAL | Status: DC
  Administered 2018-02-08 – 2018-02-09 (×5): 15 mL via OROMUCOSAL

## 2018-02-08 MED ORDER — PLASMA-LYTE 148 IV SOLN
INTRAVENOUS | Status: DC | PRN
  Administered 2018-02-08 (×2): 500 mL via INTRAVASCULAR

## 2018-02-08 MED ORDER — DOPAMINE-DEXTROSE 3.2-5 MG/ML-% IV SOLN: 0.0000 ug/kg/min | INTRAVENOUS | Status: DC

## 2018-02-08 MED ORDER — CHLORHEXIDINE GLUCONATE 0.12% ORAL RINSE (MEDLINE KIT)
15.0000 mL | Freq: Two times a day (BID) | OROMUCOSAL | Status: DC
  Administered 2018-02-08: 15 mL via OROMUCOSAL

## 2018-02-08 MED ORDER — ALBUMIN HUMAN 5 % IV SOLN
250.0000 mL | INTRAVENOUS | Status: AC | PRN
  Administered 2018-02-08 (×2): 250 mL via INTRAVENOUS

## 2018-02-08 MED ORDER — LACTATED RINGERS IV SOLN: 500.0000 mL | Freq: Once | INTRAVENOUS | Status: DC | PRN

## 2018-02-08 MED ORDER — LIDOCAINE HCL (CARDIAC) PF 100 MG/5ML IV SOSY
PREFILLED_SYRINGE | INTRAVENOUS | Status: DC | PRN
  Administered 2018-02-08: 250 mg via INTRAVENOUS

## 2018-02-08 MED ORDER — BISACODYL 5 MG PO TBEC
10.0000 mg | DELAYED_RELEASE_TABLET | Freq: Every day | ORAL | Status: DC
  Administered 2018-02-09 – 2018-02-10 (×2): 10 mg via ORAL
  Filled 2018-02-08 (×2): qty 2

## 2018-02-08 MED ORDER — NITROGLYCERIN IN D5W 200-5 MCG/ML-% IV SOLN: 0.0000 ug/min | INTRAVENOUS | Status: DC

## 2018-02-08 MED ORDER — ACETAMINOPHEN 500 MG PO TABS
1000.0000 mg | ORAL_TABLET | Freq: Four times a day (QID) | ORAL | Status: DC
  Administered 2018-02-09 – 2018-02-11 (×9): 1000 mg via ORAL
  Filled 2018-02-08 (×9): qty 2

## 2018-02-08 MED ORDER — LACTATED RINGERS IV SOLN
INTRAVENOUS | Status: DC | PRN
  Administered 2018-02-08: 10:00:00 via INTRAVENOUS

## 2018-02-08 MED ORDER — ROCURONIUM BROMIDE 10 MG/ML (PF) SYRINGE
PREFILLED_SYRINGE | INTRAVENOUS | Status: DC | PRN
  Administered 2018-02-08: 100 mg via INTRAVENOUS
  Administered 2018-02-08: 50 mg via INTRAVENOUS

## 2018-02-08 MED ORDER — PANTOPRAZOLE SODIUM 40 MG PO TBEC
40.0000 mg | DELAYED_RELEASE_TABLET | Freq: Every day | ORAL | Status: DC
  Administered 2018-02-10: 40 mg via ORAL
  Filled 2018-02-08: qty 1

## 2018-02-08 MED ORDER — FAMOTIDINE IN NACL 20-0.9 MG/50ML-% IV SOLN
20.0000 mg | Freq: Two times a day (BID) | INTRAVENOUS | Status: AC
  Administered 2018-02-08 (×2): 20 mg via INTRAVENOUS
  Filled 2018-02-08: qty 50

## 2018-02-08 MED ORDER — METOPROLOL TARTRATE 25 MG/10 ML ORAL SUSPENSION
12.5000 mg | Freq: Two times a day (BID) | ORAL | Status: DC
  Administered 2018-02-08: 12.5 mg
  Filled 2018-02-08: qty 5

## 2018-02-08 MED ORDER — PROTAMINE SULFATE 10 MG/ML IV SOLN
INTRAVENOUS | Status: AC
  Filled 2018-02-08: qty 5

## 2018-02-08 MED ORDER — SODIUM CHLORIDE 0.9 % IJ SOLN
INTRAMUSCULAR | Status: AC
  Filled 2018-02-08: qty 10

## 2018-02-08 MED ORDER — LACTATED RINGERS IV SOLN
INTRAVENOUS | Status: DC
  Administered 2018-02-08: 17:00:00 via INTRAVENOUS

## 2018-02-08 MED ORDER — PROTAMINE SULFATE 10 MG/ML IV SOLN
INTRAVENOUS | Status: DC | PRN
  Administered 2018-02-08 (×12): 25 mg via INTRAVENOUS

## 2018-02-08 MED ORDER — FENTANYL CITRATE (PF) 100 MCG/2ML IJ SOLN
50.0000 ug | INTRAMUSCULAR | Status: DC | PRN
  Administered 2018-02-08 – 2018-02-09 (×2): 100 ug via INTRAVENOUS
  Administered 2018-02-10: 50 ug via INTRAVENOUS
  Filled 2018-02-08 (×3): qty 2

## 2018-02-08 MED ORDER — HEMOSTATIC AGENTS (NO CHARGE) OPTIME
TOPICAL | Status: DC | PRN
  Administered 2018-02-08 (×5): 1 via TOPICAL

## 2018-02-08 MED ORDER — TRAMADOL HCL 50 MG PO TABS
50.0000 mg | ORAL_TABLET | ORAL | Status: DC | PRN
  Administered 2018-02-09: 100 mg via ORAL
  Administered 2018-02-09: 50 mg via ORAL
  Filled 2018-02-08 (×2): qty 2
  Filled 2018-02-08: qty 1

## 2018-02-08 MED ORDER — ROCURONIUM BROMIDE 100 MG/10ML IV SOLN
INTRAVENOUS | Status: DC | PRN
  Administered 2018-02-08: 20 mg via INTRAVENOUS
  Administered 2018-02-08: 30 mg via INTRAVENOUS

## 2018-02-08 MED ORDER — POTASSIUM CHLORIDE 10 MEQ/50ML IV SOLN: 10.0000 meq | INTRAVENOUS | Status: AC

## 2018-02-08 MED ORDER — DEXMEDETOMIDINE HCL IN NACL 200 MCG/50ML IV SOLN
0.0000 ug/kg/h | INTRAVENOUS | Status: DC
  Administered 2018-02-08: 0.7 ug/kg/h via INTRAVENOUS
  Filled 2018-02-08 (×2): qty 50

## 2018-02-08 MED ORDER — FENTANYL CITRATE (PF) 250 MCG/5ML IJ SOLN
INTRAMUSCULAR | Status: AC
  Filled 2018-02-08: qty 20

## 2018-02-08 MED ORDER — INSULIN REGULAR BOLUS VIA INFUSION
0.0000 [IU] | Freq: Three times a day (TID) | INTRAVENOUS | Status: DC
  Filled 2018-02-08: qty 10

## 2018-02-08 MED ORDER — ONDANSETRON HCL 4 MG/2ML IJ SOLN: 4.0000 mg | Freq: Four times a day (QID) | INTRAMUSCULAR | Status: DC | PRN

## 2018-02-08 MED ORDER — HEPARIN SODIUM (PORCINE) 1000 UNIT/ML IJ SOLN
INTRAMUSCULAR | Status: AC
  Filled 2018-02-08: qty 1

## 2018-02-08 MED ORDER — MILRINONE LACTATE IN DEXTROSE 20-5 MG/100ML-% IV SOLN
0.1500 ug/kg/min | INTRAVENOUS | Status: AC
  Administered 2018-02-08 – 2018-02-09 (×2): 0.3 ug/kg/min via INTRAVENOUS
  Administered 2018-02-10: 0.15 ug/kg/min via INTRAVENOUS
  Filled 2018-02-08 (×3): qty 100

## 2018-02-08 MED ORDER — SODIUM CHLORIDE 0.9% FLUSH: 3.0000 mL | INTRAVENOUS | Status: DC | PRN

## 2018-02-08 MED ORDER — LACTATED RINGERS IV SOLN
Freq: Once | INTRAVENOUS | Status: AC
  Administered 2018-02-08: 08:00:00 via INTRAVENOUS

## 2018-02-08 MED ORDER — MIDAZOLAM HCL 2 MG/2ML IJ SOLN
INTRAMUSCULAR | Status: AC
  Administered 2018-02-08: 2 mg
  Filled 2018-02-08: qty 2

## 2018-02-08 MED ORDER — NOREPINEPHRINE BITARTRATE 1 MG/ML IV SOLN
INTRAVENOUS | Status: DC | PRN
  Administered 2018-02-08: 2 ug/min via INTRAVENOUS

## 2018-02-08 MED ORDER — ALBUMIN HUMAN 5 % IV SOLN
INTRAVENOUS | Status: DC | PRN
  Administered 2018-02-08: 15:00:00 via INTRAVENOUS

## 2018-02-08 MED ORDER — MIDAZOLAM HCL 10 MG/2ML IJ SOLN
INTRAMUSCULAR | Status: AC
  Filled 2018-02-08: qty 2

## 2018-02-08 MED ORDER — SODIUM CHLORIDE 0.9 % IV SOLN
INTRAVENOUS | Status: DC
  Administered 2018-02-08: 17:00:00 via INTRAVENOUS

## 2018-02-08 MED ORDER — PROTAMINE SULFATE 10 MG/ML IV SOLN
INTRAVENOUS | Status: AC
  Filled 2018-02-08: qty 25

## 2018-02-08 MED ORDER — PHENYLEPHRINE 40 MCG/ML (10ML) SYRINGE FOR IV PUSH (FOR BLOOD PRESSURE SUPPORT)
PREFILLED_SYRINGE | INTRAVENOUS | Status: AC
  Filled 2018-02-08: qty 10

## 2018-02-08 MED ORDER — ROCURONIUM BROMIDE 50 MG/5ML IV SOLN
INTRAVENOUS | Status: AC
  Filled 2018-02-08: qty 2

## 2018-02-08 MED ORDER — NOREPINEPHRINE 4 MG/250ML-% IV SOLN
0.0000 ug/min | INTRAVENOUS | Status: DC
  Administered 2018-02-08: 10 ug/min via INTRAVENOUS
  Administered 2018-02-09: 3 ug/min via INTRAVENOUS
  Administered 2018-02-09: 12 ug/min via INTRAVENOUS
  Filled 2018-02-08 (×4): qty 250

## 2018-02-08 MED ORDER — SODIUM BICARBONATE 8.4 % IV SOLN
50.0000 meq | Freq: Once | INTRAVENOUS | Status: AC
  Administered 2018-02-08: 50 meq via INTRAVENOUS

## 2018-02-08 MED ORDER — CHLORHEXIDINE GLUCONATE 0.12 % MT SOLN
15.0000 mL | OROMUCOSAL | Status: AC
  Administered 2018-02-08: 15 mL via OROMUCOSAL

## 2018-02-08 MED ORDER — FENTANYL CITRATE (PF) 100 MCG/2ML IJ SOLN
INTRAMUSCULAR | Status: AC
  Administered 2018-02-08: 50 ug
  Filled 2018-02-08: qty 2

## 2018-02-08 MED ORDER — LACTATED RINGERS IV SOLN
INTRAVENOUS | Status: DC
  Administered 2018-02-08: 22:00:00 via INTRAVENOUS

## 2018-02-08 MED ORDER — MILRINONE LACTATE IN DEXTROSE 20-5 MG/100ML-% IV SOLN
INTRAVENOUS | Status: DC | PRN
  Administered 2018-02-08: .5 mg/kg/min via INTRAVENOUS

## 2018-02-08 MED FILL — Heparin Sod (Porcine)-NaCl IV Soln 1000 Unit/500ML-0.9%: INTRAVENOUS | Qty: 1000 | Status: AC

## 2018-02-08 SURGICAL SUPPLY — 78 items
ADH SKN CLS APL DERMABOND .7 (GAUZE/BANDAGES/DRESSINGS) ×2
BAG DECANTER FOR FLEXI CONT (MISCELLANEOUS) ×3 IMPLANT
BANDAGE ACE 4X5 VEL STRL LF (GAUZE/BANDAGES/DRESSINGS) ×3 IMPLANT
BANDAGE ACE 6X5 VEL STRL LF (GAUZE/BANDAGES/DRESSINGS) ×3 IMPLANT
BLADE STERNUM SYSTEM 6 (BLADE) ×3 IMPLANT
BNDG GAUZE ELAST 4 BULKY (GAUZE/BANDAGES/DRESSINGS) ×3 IMPLANT
CANISTER SUCT 3000ML PPV (MISCELLANEOUS) ×3 IMPLANT
CANNULA VESSEL 3MM 2 BLNT TIP (CANNULA) ×1 IMPLANT
CATH CPB KIT GERHARDT (MISCELLANEOUS) ×3 IMPLANT
CATH THORACIC 28FR (CATHETERS) ×3 IMPLANT
CLIP FOGARTY SPRING 6M (CLIP) ×1 IMPLANT
CLIP RETRACTION 3.0MM CORONARY (MISCELLANEOUS) ×1 IMPLANT
CRADLE DONUT ADULT HEAD (MISCELLANEOUS) ×3 IMPLANT
DERMABOND ADVANCED (GAUZE/BANDAGES/DRESSINGS) ×1
DERMABOND ADVANCED .7 DNX12 (GAUZE/BANDAGES/DRESSINGS) ×2 IMPLANT
DRAIN CHANNEL 28F RND 3/8 FF (WOUND CARE) ×3 IMPLANT
DRAPE CARDIOVASCULAR INCISE (DRAPES) ×3
DRAPE SLUSH/WARMER DISC (DRAPES) ×3 IMPLANT
DRAPE SRG 135X102X78XABS (DRAPES) ×2 IMPLANT
DRSG AQUACEL AG ADV 3.5X14 (GAUZE/BANDAGES/DRESSINGS) ×3 IMPLANT
ELECT BLADE 4.0 EZ CLEAN MEGAD (MISCELLANEOUS) ×3
ELECT REM PT RETURN 9FT ADLT (ELECTROSURGICAL) ×6
ELECTRODE BLDE 4.0 EZ CLN MEGD (MISCELLANEOUS) ×2 IMPLANT
ELECTRODE REM PT RTRN 9FT ADLT (ELECTROSURGICAL) ×4 IMPLANT
FELT TEFLON 1X6 (MISCELLANEOUS) ×5 IMPLANT
FLOSEAL 5ML (HEMOSTASIS) ×3 IMPLANT
GAUZE SPONGE 4X4 12PLY STRL (GAUZE/BANDAGES/DRESSINGS) ×6 IMPLANT
GAUZE SPONGE 4X4 12PLY STRL LF (GAUZE/BANDAGES/DRESSINGS) ×2 IMPLANT
GLOVE BIO SURGEON STRL SZ 6.5 (GLOVE) ×21 IMPLANT
GLOVE BIO SURGEON STRL SZ7 (GLOVE) ×2 IMPLANT
GLOVE BIO SURGEON STRL SZ7.5 (GLOVE) ×2 IMPLANT
GLOVE BIOGEL PI IND STRL 7.0 (GLOVE) ×4 IMPLANT
GLOVE BIOGEL PI IND STRL 7.5 (GLOVE) IMPLANT
GLOVE BIOGEL PI INDICATOR 7.0 (GLOVE) ×4
GLOVE BIOGEL PI INDICATOR 7.5 (GLOVE) ×1
GOWN STRL REUS W/ TWL LRG LVL3 (GOWN DISPOSABLE) ×8 IMPLANT
GOWN STRL REUS W/TWL LRG LVL3 (GOWN DISPOSABLE) ×27
HEMOSTAT POWDER SURGIFOAM 1G (HEMOSTASIS) ×8 IMPLANT
HEMOSTAT SURGICEL 2X14 (HEMOSTASIS) ×6 IMPLANT
KIT BASIN OR (CUSTOM PROCEDURE TRAY) ×3 IMPLANT
KIT CATH SUCT 8FR (CATHETERS) ×3 IMPLANT
KIT SUCTION CATH 14FR (SUCTIONS) ×6 IMPLANT
KIT TURNOVER KIT B (KITS) ×3 IMPLANT
KIT VASOVIEW HEMOPRO VH 3000 (KITS) ×3 IMPLANT
LEAD PACING MYOCARDI (MISCELLANEOUS) ×3 IMPLANT
MARKER GRAFT CORONARY BYPASS (MISCELLANEOUS) ×9 IMPLANT
NS IRRIG 1000ML POUR BTL (IV SOLUTION) ×15 IMPLANT
PACK E OPEN HEART (SUTURE) ×3 IMPLANT
PACK OPEN HEART (CUSTOM PROCEDURE TRAY) ×3 IMPLANT
PAD ARMBOARD 7.5X6 YLW CONV (MISCELLANEOUS) ×6 IMPLANT
PAD ELECT DEFIB RADIOL ZOLL (MISCELLANEOUS) ×3 IMPLANT
PENCIL BUTTON HOLSTER BLD 10FT (ELECTRODE) ×3 IMPLANT
PUNCH AORTIC ROTATE  4.5MM 8IN (MISCELLANEOUS) ×3 IMPLANT
SET CARDIOPLEGIA MPS 5001102 (MISCELLANEOUS) ×1 IMPLANT
SPONGE LAP 18X18 X RAY DECT (DISPOSABLE) ×4 IMPLANT
SUT BONE WAX W31G (SUTURE) ×3 IMPLANT
SUT PROLENE 3 0 SH1 36 (SUTURE) ×4 IMPLANT
SUT PROLENE 4 0 TF (SUTURE) ×6 IMPLANT
SUT PROLENE 5 0 C 1 36 (SUTURE) ×1 IMPLANT
SUT PROLENE 6 0 CC (SUTURE) ×7 IMPLANT
SUT PROLENE 7 0 BV1 MDA (SUTURE) ×4 IMPLANT
SUT PROLENE 8 0 BV175 6 (SUTURE) ×3 IMPLANT
SUT SILK 2 0 SH CR/8 (SUTURE) ×1 IMPLANT
SUT STEEL 6MS V (SUTURE) ×3 IMPLANT
SUT STEEL SZ 6 DBL 3X14 BALL (SUTURE) ×3 IMPLANT
SUT VIC AB 1 CTX 18 (SUTURE) ×6 IMPLANT
SUT VIC AB 2-0 CT1 27 (SUTURE) ×3
SUT VIC AB 2-0 CT1 TAPERPNT 27 (SUTURE) IMPLANT
SUT VIC AB 3-0 X1 27 (SUTURE) ×1 IMPLANT
SYSTEM SAHARA CHEST DRAIN ATS (WOUND CARE) ×3 IMPLANT
TAPE CLOTH SURG 4X10 WHT LF (GAUZE/BANDAGES/DRESSINGS) ×2 IMPLANT
TAPE PAPER 3X10 WHT MICROPORE (GAUZE/BANDAGES/DRESSINGS) ×3 IMPLANT
TOWEL GREEN STERILE (TOWEL DISPOSABLE) ×3 IMPLANT
TOWEL GREEN STERILE FF (TOWEL DISPOSABLE) ×3 IMPLANT
TRAY FOLEY SLVR 16FR TEMP STAT (SET/KITS/TRAYS/PACK) ×3 IMPLANT
TUBING INSUFFLATION (TUBING) ×3 IMPLANT
UNDERPAD 30X30 (UNDERPADS AND DIAPERS) ×3 IMPLANT
WATER STERILE IRR 1000ML POUR (IV SOLUTION) ×6 IMPLANT

## 2018-02-08 NOTE — Anesthesia Preprocedure Evaluation (Signed)
Anesthesia Evaluation  Patient identified by MRN, date of birth, ID band Patient awake    Reviewed: Allergy & Precautions, NPO status , Patient's Chart, lab work & pertinent test results  History of Anesthesia Complications Negative for: history of anesthetic complications  Airway Mallampati: II  TM Distance: <3 FB Neck ROM: Full    Dental  (+) Edentulous Upper, Edentulous Lower   Pulmonary neg shortness of breath, neg COPD, neg recent URI, former smoker,    breath sounds clear to auscultation       Cardiovascular + CAD and +CHF   Rhythm:Regular     Neuro/Psych negative neurological ROS  negative psych ROS   GI/Hepatic negative GI ROS, Neg liver ROS,   Endo/Other  diabetes, Oral Hypoglycemic Agents  Renal/GU Renal InsufficiencyRenal disease     Musculoskeletal   Abdominal   Peds  Hematology   Anesthesia Other Findings   Reproductive/Obstetrics                             Anesthesia Physical Anesthesia Plan  ASA: IV  Anesthesia Plan: General   Post-op Pain Management:    Induction: Intravenous  PONV Risk Score and Plan: 2 and Treatment may vary due to age or medical condition  Airway Management Planned: Oral ETT  Additional Equipment: Arterial line, CVP, PA Cath, TEE and Ultrasound Guidance Line Placement  Intra-op Plan:   Post-operative Plan: Post-operative intubation/ventilation  Informed Consent: I have reviewed the patients History and Physical, chart, labs and discussed the procedure including the risks, benefits and alternatives for the proposed anesthesia with the patient or authorized representative who has indicated his/her understanding and acceptance.   Dental advisory given  Plan Discussed with: CRNA and Surgeon  Anesthesia Plan Comments:         Anesthesia Quick Evaluation

## 2018-02-08 NOTE — Progress Notes (Signed)
Respiratory decreased FIO2 to 60%, patient still remains too drowsy to extubate at this time, will continue to maintain sedation off until patient is fully awake.  Will follow commands but not awake enough to continue to wean completely off the vent.

## 2018-02-08 NOTE — Progress Notes (Signed)
  Echocardiogram Echocardiogram Transesophageal has been performed.  Alexander Parks 02/08/2018, 10:53 AM

## 2018-02-08 NOTE — Transfer of Care (Signed)
Immediate Anesthesia Transfer of Care Note  Patient: JESSEY STEHLIN  Procedure(s) Performed: CORONARY ARTERY BYPASS GRAFT times five, LIMA-LAD, SVG-DIAG, SVG-OM, SVG-PD-PL(SEQ), using left internal mammary artery and Endoharvest of Right greater saphenous vein (N/A Chest) INTRAOPERATIVE TRANSESOPHAGEAL ECHOCARDIOGRAM (N/A )  Patient Location: ICU  Anesthesia Type:General  Level of Consciousness: sedated and Patient remains intubated per anesthesia plan  Airway & Oxygen Therapy: Patient remains intubated per anesthesia plan  Post-op Assessment: Report given to RN and Post -op Vital signs reviewed and stable  Post vital signs: Reviewed and stable  Last Vitals:  Vitals Value Taken Time  BP 94/59 02/08/2018  4:45 PM  Temp    Pulse 97 02/08/2018  4:41 PM  Resp 12 02/08/2018  4:44 PM  SpO2 98 % 02/08/2018  4:41 PM  Vitals shown include unvalidated device data.  Last Pain:  Vitals:   02/08/18 0630  TempSrc: Oral  PainSc:          Complications: No apparent anesthesia complications

## 2018-02-08 NOTE — Anesthesia Postprocedure Evaluation (Signed)
Anesthesia Post Note  Patient: JERICO GRISSO  Procedure(s) Performed: CORONARY ARTERY BYPASS GRAFT times five, LIMA-LAD, SVG-DIAG, SVG-OM, SVG-PD-PL(SEQ), using left internal mammary artery and Endoharvest of Right greater saphenous vein (N/A Chest) INTRAOPERATIVE TRANSESOPHAGEAL ECHOCARDIOGRAM (N/A )     Patient location during evaluation: SICU Anesthesia Type: General Level of consciousness: sedated Pain management: pain level controlled Vital Signs Assessment: post-procedure vital signs reviewed and stable Respiratory status: patient remains intubated per anesthesia plan Cardiovascular status: stable Postop Assessment: no apparent nausea or vomiting Anesthetic complications: no    Last Vitals:  Vitals:   02/08/18 1710 02/08/18 1714  BP:    Pulse:  70  Resp:  14  Temp:  (!) 35.7 C  SpO2: 97%     Last Pain:  Vitals:   02/08/18 0630  TempSrc: Oral  PainSc:                  Sundance Moise,W. EDMOND

## 2018-02-08 NOTE — Progress Notes (Signed)
      [address]       [place] [address]             [phone]       S/p CABG x 5  Intubated and sedated  BP (!) 98/53   Pulse 70   Temp (!) 96.3 F (35.7 C)   Resp 14   Ht 5\' 11"  (1.803 m)   Wt 225 lb 1.6 oz (102.1 kg)   SpO2 97%   BMI 31.40 kg/m   Intake/Output Summary (Last 24 hours) at 02/08/2018 1759 Last data filed at 02/08/2018 1645 Gross per 24 hour  Intake 4577.28 ml  Output 2200 ml  Net 2377.28 ml   CI= 2.1 on dopamine and milrinone  Doing well early postop  Remo Lipps C. Roxan Hockey, MD [facility] [phone]

## 2018-02-08 NOTE — Brief Op Note (Signed)
      [address]       [place] [address]             [phone]     02/07/2018 - 02/08/2018  5:00 PM  PATIENT:  Alexander Parks  67 y.o. male  PRE-OPERATIVE DIAGNOSIS:  CAD  POST-OPERATIVE DIAGNOSIS:  CAD  PROCEDURE:  Procedure(s): CORONARY ARTERY BYPASS GRAFT times five, LIMA-LAD, SVG-DIAG, SVG-OM, SVG-PD-PL(SEQ), using left internal mammary artery and Endoharvest of Right greater saphenous vein (N/A) INTRAOPERATIVE TRANSESOPHAGEAL ECHOCARDIOGRAM (N/A)  SURGEON:  Surgeon(s) and Role:    * Grace Isaac, MD - Primary  PHYSICIAN ASSISTANT: WAYNE GOLD PA-C  ANESTHESIA:   general  EBL:  720 mL   BLOOD ADMINISTERED:none  DRAINS: PLEURAL AND PERICARDIAL CHEST TUBES   LOCAL MEDICATIONS USED:  NONE  SPECIMEN:  No Specimen  DISPOSITION OF SPECIMEN:  N/A  COUNTS:  YES   DICTATION: .Other Dictation: Dictation Number PENDING  PLAN OF CARE: Admit to inpatient   PATIENT DISPOSITION:  ICU - intubated and hemodynamically stable.   Delay start of Pharmacological VTE agent (>24hrs) due to surgical blood loss or risk of bleeding: yes

## 2018-02-08 NOTE — Anesthesia Procedure Notes (Signed)
Procedure Name: Intubation Date/Time: 02/08/2018 10:50 AM Performed by: Eligha Bridegroom, CRNA Pre-anesthesia Checklist: Patient identified, Emergency Drugs available, Patient being monitored, Timeout performed and Suction available Patient Re-evaluated:Patient Re-evaluated prior to induction Oxygen Delivery Method: Circle system utilized Preoxygenation: Pre-oxygenation with 100% oxygen Induction Type: IV induction Laryngoscope Size: Mac and 4 Grade View: Grade I Tube type: Oral Tube size: 8.0 mm Number of attempts: 1 Airway Equipment and Method: Stylet Placement Confirmation: ETT inserted through vocal cords under direct vision,  positive ETCO2 and breath sounds checked- equal and bilateral Secured at: 21 cm Tube secured with: Tape Dental Injury: Teeth and Oropharynx as per pre-operative assessment

## 2018-02-08 NOTE — Progress Notes (Signed)
      [address]       [place] [address]             [phone]    Pre Procedure note for inpatients:   Alexander Parks has been scheduled for Procedure(s): CORONARY ARTERY BYPASS GRAFTING (CABG) (N/A) INTRAOPERATIVE TRANSESOPHAGEAL ECHOCARDIOGRAM (N/A) today. The various methods of treatment have been discussed with the patient. After consideration of the risks, benefits and treatment options the patient has consented to the planned procedure.   The patient has been seen and labs reviewed. There are no changes in the patient's condition to prevent proceeding with the planned procedure today.  Recent labs:  Lab Results  Component Value Date   WBC 14.9 (H) 02/08/2018   HGB 13.8 02/08/2018   HCT 41.7 02/08/2018   PLT 120 (L) 02/08/2018   GLUCOSE 187 (H) 02/08/2018   ALT 26 02/07/2018   AST 27 02/07/2018   NA 137 02/08/2018   K 4.2 02/08/2018   CL 106 02/08/2018   CREATININE 1.16 02/08/2018   BUN 31 (H) 02/08/2018   CO2 22 02/08/2018   INR 1.22 02/07/2018   HGBA1C 9.5 (H) 02/07/2018    Grace Isaac, MD 02/08/2018 7:58 AM

## 2018-02-08 NOTE — Progress Notes (Signed)
Pt transported to pre op with Camera operator and wife at side, all belonging removed from room, Tele will be removed CCMD notified.

## 2018-02-08 NOTE — Progress Notes (Signed)
Spoke with respiratory Marvin at 2245 regarding ABG results, and decreasing FIO2 on vent.  Sedation completely off, but patient is not fully awake.  Follows commands and is remaining cooperative and calm.  Monitoring continues.

## 2018-02-08 NOTE — OR Nursing (Signed)
SICU calls to charge nurse #[phone]                                              #[phone]

## 2018-02-09 ENCOUNTER — Inpatient Hospital Stay (HOSPITAL_COMMUNITY): Payer: Medicare HMO

## 2018-02-09 ENCOUNTER — Encounter (HOSPITAL_COMMUNITY): Payer: Self-pay | Admitting: Cardiothoracic Surgery

## 2018-02-09 LAB — CBC
HEMATOCRIT: 34.1 % — AB (ref 39.0–52.0)
HEMATOCRIT: 30.8 % — AB (ref 39.0–52.0)
HEMOGLOBIN: 11 g/dL — AB (ref 13.0–17.0)
HEMOGLOBIN: 9.9 g/dL — AB (ref 13.0–17.0)
MCH: 28.2 pg (ref 26.0–34.0)
MCH: 28.6 pg (ref 26.0–34.0)
MCHC: 32.3 g/dL (ref 30.0–36.0)
MCHC: 32.1 g/dL (ref 30.0–36.0)
MCV: 87.4 fL (ref 78.0–100.0)
MCV: 89 fL (ref 78.0–100.0)
Platelets: 123 10*3/uL — ABNORMAL LOW (ref 150–400)
Platelets: 69 10*3/uL — ABNORMAL LOW (ref 150–400)
RBC: 3.9 MIL/uL — AB (ref 4.22–5.81)
RBC: 3.46 MIL/uL — AB (ref 4.22–5.81)
RDW: 13.6 % (ref 11.5–15.5)
RDW: 13.9 % (ref 11.5–15.5)
WBC: 18.1 10*3/uL — AB (ref 4.0–10.5)
WBC: 12 10*3/uL — ABNORMAL HIGH (ref 4.0–10.5)

## 2018-02-09 LAB — POCT I-STAT, CHEM 8
BUN: 20 mg/dL (ref 8–23)
CREATININE: 0.9 mg/dL (ref 0.61–1.24)
Calcium, Ion: 1.15 mmol/L (ref 1.15–1.40)
Chloride: 101 mmol/L (ref 98–111)
GLUCOSE: 189 mg/dL — AB (ref 70–99)
HEMATOCRIT: 29 % — AB (ref 39.0–52.0)
HEMOGLOBIN: 9.9 g/dL — AB (ref 13.0–17.0)
POTASSIUM: 4.3 mmol/L (ref 3.5–5.1)
Sodium: 138 mmol/L (ref 135–145)
TCO2: 21 mmol/L — ABNORMAL LOW (ref 22–32)

## 2018-02-09 LAB — CREATININE, SERUM
Creatinine, Ser: 1 mg/dL (ref 0.61–1.24)
GFR calc Af Amer: >60 mL/min (ref >60)

## 2018-02-09 LAB — GLUCOSE, CAPILLARY
GLUCOSE-CAPILLARY: 103 mg/dL — AB (ref 70–99)
GLUCOSE-CAPILLARY: 105 mg/dL — AB (ref 70–99)
GLUCOSE-CAPILLARY: 119 mg/dL — AB (ref 70–99)
GLUCOSE-CAPILLARY: 127 mg/dL — AB (ref 70–99)
GLUCOSE-CAPILLARY: 155 mg/dL — AB (ref 70–99)
GLUCOSE-CAPILLARY: 159 mg/dL — AB (ref 70–99)
GLUCOSE-CAPILLARY: 163 mg/dL — AB (ref 70–99)
GLUCOSE-CAPILLARY: 164 mg/dL — AB (ref 70–99)
GLUCOSE-CAPILLARY: 181 mg/dL — AB (ref 70–99)
GLUCOSE-CAPILLARY: 184 mg/dL — AB (ref 70–99)
Glucose-Capillary: 130 mg/dL — ABNORMAL HIGH (ref 70–99)
Glucose-Capillary: 106 mg/dL — ABNORMAL HIGH (ref 70–99)
Glucose-Capillary: 110 mg/dL — ABNORMAL HIGH (ref 70–99)
Glucose-Capillary: 112 mg/dL — ABNORMAL HIGH (ref 70–99)
Glucose-Capillary: 111 mg/dL — ABNORMAL HIGH (ref 70–99)
Glucose-Capillary: 93 mg/dL (ref 70–99)
Glucose-Capillary: 175 mg/dL — ABNORMAL HIGH (ref 70–99)

## 2018-02-09 LAB — COOXEMETRY PANEL
Carboxyhemoglobin: 1.4 % (ref 0.5–1.5)
Methemoglobin: 1 % (ref 0.0–1.5)
O2 Saturation: 80 %
Total hemoglobin: 11.4 g/dL — ABNORMAL LOW (ref 12.0–16.0)

## 2018-02-09 LAB — BASIC METABOLIC PANEL
ANION GAP: 7 (ref 5–15)
BUN: 21 mg/dL (ref 8–23)
CHLORIDE: 109 mmol/L (ref 98–111)
CO2: 23 mmol/L (ref 22–32)
CREATININE: 1.08 mg/dL (ref 0.61–1.24)
Calcium: 7.7 mg/dL — ABNORMAL LOW (ref 8.9–10.3)
GFR calc non Af Amer: >60 mL/min (ref >60)
Glucose, Bld: 126 mg/dL — ABNORMAL HIGH (ref 70–99)
POTASSIUM: 4.2 mmol/L (ref 3.5–5.1)
Sodium: 139 mmol/L (ref 135–145)

## 2018-02-09 LAB — POCT I-STAT 3, ART BLOOD GAS (G3+)
Acid-base deficit: 2 mmol/L (ref 0.0–2.0)
Acid-base deficit: 2 mmol/L (ref 0.0–2.0)
BICARBONATE: 23 mmol/L (ref 20.0–28.0)
Bicarbonate: 22.6 mmol/L (ref 20.0–28.0)
O2 Saturation: 98 %
O2 Saturation: 97 %
PCO2 ART: 37.8 mmHg (ref 32.0–48.0)
PH ART: 7.387 (ref 7.350–7.450)
PO2 ART: 106 mmHg (ref 83.0–108.0)
PO2 ART: 90 mmHg (ref 83.0–108.0)
Patient temperature: 37.6
Patient temperature: 37.5
TCO2: 24 mmol/L (ref 22–32)
TCO2: 24 mmol/L (ref 22–32)
pCO2 arterial: 38.4 mmHg (ref 32.0–48.0)
pH, Arterial: 7.387 (ref 7.350–7.450)

## 2018-02-09 LAB — MAGNESIUM
MAGNESIUM: 2.3 mg/dL (ref 1.7–2.4)
Magnesium: 2 mg/dL (ref 1.7–2.4)

## 2018-02-09 MED ORDER — INSULIN DETEMIR 100 UNIT/ML ~~LOC~~ SOLN
15.0000 [IU] | Freq: Every day | SUBCUTANEOUS | Status: DC
  Administered 2018-02-10: 15 [IU] via SUBCUTANEOUS
  Filled 2018-02-09: qty 0.15

## 2018-02-09 MED ORDER — INSULIN DETEMIR 100 UNIT/ML ~~LOC~~ SOLN
15.0000 [IU] | Freq: Once | SUBCUTANEOUS | Status: AC
  Administered 2018-02-09: 15 [IU] via SUBCUTANEOUS
  Filled 2018-02-09: qty 0.15

## 2018-02-09 MED ORDER — FUROSEMIDE 10 MG/ML IJ SOLN
40.0000 mg | Freq: Once | INTRAMUSCULAR | Status: AC
  Administered 2018-02-10: 40 mg via INTRAVENOUS
  Filled 2018-02-09: qty 4

## 2018-02-09 MED ORDER — ORAL CARE MOUTH RINSE
15.0000 mL | Freq: Two times a day (BID) | OROMUCOSAL | Status: DC
  Administered 2018-02-09 – 2018-02-10 (×3): 15 mL via OROMUCOSAL

## 2018-02-09 MED ORDER — INSULIN ASPART 100 UNIT/ML ~~LOC~~ SOLN
0.0000 [IU] | SUBCUTANEOUS | Status: DC
  Administered 2018-02-09: 2 [IU] via SUBCUTANEOUS
  Administered 2018-02-09: 4 [IU] via SUBCUTANEOUS
  Administered 2018-02-09: 2 [IU] via SUBCUTANEOUS
  Administered 2018-02-09: 4 [IU] via SUBCUTANEOUS
  Administered 2018-02-10: 2 [IU] via SUBCUTANEOUS
  Administered 2018-02-10 (×3): 4 [IU] via SUBCUTANEOUS
  Administered 2018-02-10: 2 [IU] via SUBCUTANEOUS

## 2018-02-09 MED FILL — Heparin Sodium (Porcine) Inj 1000 Unit/ML: INTRAMUSCULAR | Qty: 20 | Status: AC

## 2018-02-09 MED FILL — Heparin Sodium (Porcine) Inj 1000 Unit/ML: INTRAMUSCULAR | Qty: 30 | Status: AC

## 2018-02-09 MED FILL — Magnesium Sulfate Inj 50%: INTRAMUSCULAR | Qty: 10 | Status: AC

## 2018-02-09 MED FILL — Lidocaine HCl Local Soln Prefilled Syringe 100 MG/5ML (2%): INTRAMUSCULAR | Qty: 5 | Status: AC

## 2018-02-09 MED FILL — Electrolyte-R (PH 7.4) Solution: INTRAVENOUS | Qty: 4000 | Status: AC

## 2018-02-09 MED FILL — Potassium Chloride Inj 2 mEq/ML: INTRAVENOUS | Qty: 40 | Status: AC

## 2018-02-09 MED FILL — Calcium Chloride Inj 10%: INTRAVENOUS | Qty: 10 | Status: AC

## 2018-02-09 MED FILL — Sodium Bicarbonate IV Soln 8.4%: INTRAVENOUS | Qty: 50 | Status: AC

## 2018-02-09 MED FILL — Sodium Chloride IV Soln 0.9%: INTRAVENOUS | Qty: 2000 | Status: AC

## 2018-02-09 MED FILL — Mannitol IV Soln 20%: INTRAVENOUS | Qty: 500 | Status: AC

## 2018-02-09 NOTE — Progress Notes (Signed)
Patients HR 130's; paused dopamine gtt and notified Dr. Servando Snare.  Will continue to monitor.

## 2018-02-09 NOTE — Progress Notes (Signed)
Passed all weaning parameters and pulmonary mechanics.   NIF: > -40 VC: 2.1L  Patient following commands and was able to lift head off the pillow for >5seconds Patient has an audible cuff leak.  ABG was normal.

## 2018-02-09 NOTE — Progress Notes (Signed)
Pt is on PSV at this time tolerating it well. No distress or complications. Pt nodded his head for understanding about the weaning process and ventilatory mode switch. RN aware and at bedside with RRT.

## 2018-02-09 NOTE — Progress Notes (Signed)
Weaning initiated at this time tolerating it well

## 2018-02-09 NOTE — Progress Notes (Signed)
Patient ID: Alexander Parks, male   DOB: Dec 05, 1950, 67 y.o.   MRN: [id number] EVENING ROUNDS NOTE :     [address]       [place] [address]             [phone]                 1 Day Post-Op Procedure(s) (LRB): CORONARY ARTERY BYPASS GRAFT times five, LIMA-LAD, SVG-DIAG, SVG-OM, SVG-PD-PL(SEQ), using left internal mammary artery and Endoharvest of Right greater saphenous vein (N/A) INTRAOPERATIVE TRANSESOPHAGEAL ECHOCARDIOGRAM (N/A)  Total Length of Stay:  LOS: 2 days  BP (!) 92/57   Pulse (!) 104   Temp 98.8 F (37.1 C)   Resp 17   Ht 5\' 11"  (1.803 m)   Wt 238 lb 5.1 oz (108.1 kg)   SpO2 93%   BMI 33.24 kg/m   .Intake/Output      06/28 0701 - 06/29 0700   P.O. 280   I.V. (mL/kg) 629.1 (5.8)   Blood    IV Piggyback 99.9   Total Intake(mL/kg) 1009 (9.3)   Urine (mL/kg/hr) 935 (0.5)   Emesis/NG output    Blood    Chest Tube 190   Total Output 1125   Net -116         . sodium chloride Stopped (02/09/18 0922)  . sodium chloride Stopped (02/09/18 0519)  . sodium chloride 20 mL/hr at 02/08/18 1635  . cefUROXime (ZINACEF)  IV Stopped (02/09/18 1812)  . dexmedetomidine (PRECEDEX) IV infusion Stopped (02/08/18 2217)  . lactated ringers    . lactated ringers 20 mL/hr at 02/08/18 1636  . lactated ringers 20 mL/hr at 02/09/18 2200  . milrinone 0.15 mcg/kg/min (02/09/18 2200)  . nitroGLYCERIN Stopped (02/08/18 1636)  . norepinephrine (LEVOPHED) Adult infusion Stopped (02/09/18 1701)  . phenylephrine (NEO-SYNEPHRINE) Adult infusion Stopped (02/08/18 1636)     Lab Results  Component Value Date   WBC 12.0 (H) 02/09/2018   HGB 9.9 (L) 02/09/2018   HCT 29.0 (L) 02/09/2018   PLT 69 (L) 02/09/2018   GLUCOSE 189 (H) 02/09/2018   ALT 26 02/07/2018   AST 27 02/07/2018   NA 138 02/09/2018   K 4.3 02/09/2018   CL 101 02/09/2018   CREATININE 0.90 02/09/2018   BUN 20 02/09/2018   CO2 23 02/09/2018   INR 1.54 02/08/2018   HGBA1C 9.5 (H) 02/07/2018    Off levophed  Stable day  Grace Isaac MD  Beeper [phone] Office [phone] 02/09/2018 11:02 PM

## 2018-02-09 NOTE — Procedures (Signed)
Extubation Procedure Note  Patient Details:   Name: Alexander Parks DOB: 08/28/1950 MRN: [id number]   Airway Documentation:    Vent end date: 02/09/18 Vent end time: 0458   Evaluation  O2 sats: stable throughout Complications: No apparent complications Patient did tolerate procedure well. Bilateral Breath Sounds: Clear, Diminished   Yes   Extubation procedure was clearly explained to patient. Pt nodded his head for understanding. Patient passed all weaning parameters without complications. Patient was extubated to a 3L [place] with bubble humidity. Pt was able to speak post extubation and state that his throat is sore. RN at bedside throughout. No complications noted. Tolerated it well.   Leigh Aurora, B.S, RRT, RCP 02/09/2018, 5:03 AM

## 2018-02-09 NOTE — Progress Notes (Signed)
Patient ID: Alexander Parks, male   DOB: April 24, 1951, 67 y.o.   MRN: [id number] TCTS DAILY ICU PROGRESS NOTE                   [address]            [place] [address]          [phone]   1 Day Post-Op Procedure(s) (LRB): CORONARY ARTERY BYPASS GRAFT times five, LIMA-LAD, SVG-DIAG, SVG-OM, SVG-PD-PL(SEQ), using left internal mammary artery and Endoharvest of Right greater saphenous vein (N/A) INTRAOPERATIVE TRANSESOPHAGEAL ECHOCARDIOGRAM (N/A)  Total Length of Stay:  LOS: 2 days   Subjective: Patient awake and alert, extubated last pm  Objective: Vital signs in last 24 hours: Temp:  [95.9 F (35.5 C)-99.9 F (37.7 C)] 99.1 F (37.3 C) (06/28 0815) Pulse Rate:  [70-126] 112 (06/28 0815) Cardiac Rhythm: Sinus tachycardia (06/28 0800) Resp:  [8-25] 21 (06/28 0815) BP: (90-130)/(44-84) 96/56 (06/28 0800) SpO2:  [88 %-100 %] 98 % (06/28 0815) Arterial Line BP: (107-148)/(41-71) 123/47 (06/28 0815) FiO2 (%):  [40 %-100 %] 40 % (06/28 0428) Weight:  [225 lb 1.4 oz (102.1 kg)-238 lb 5.1 oz (108.1 kg)] 238 lb 5.1 oz (108.1 kg) (06/28 0500)  Filed Weights   02/08/18 0505 02/08/18 1700 02/09/18 0500  Weight: 225 lb 1.6 oz (102.1 kg) 225 lb 1.4 oz (102.1 kg) 238 lb 5.1 oz (108.1 kg)    Weight change: 1.4 oz (0.041 kg)   Hemodynamic parameters for last 24 hours: PAP: (15-34)/(7-25) 15/7 CO:  [4.8 L/min-8.1 L/min] 8.1 L/min CI:  [2.2 L/min/m2-3.6 L/min/m2] 3.6 L/min/m2  Intake/Output from previous day: 06/27 0701 - 06/28 0700 In: 5568.4 [I.V.:4184.5; Blood:830; IV Piggyback:553.9] Out: 2430 [Urine:1500; Emesis/NG output:30; Blood:720; Chest Tube:180]  Intake/Output this shift: Total I/O In: 84.7 [I.V.:84.7] Out: 220 [Urine:200; Chest Tube:20]  Current Meds: Scheduled Meds: . acetaminophen  1,000 mg Oral Q6H   Or  . acetaminophen (TYLENOL) oral liquid 160 mg/5 mL  1,000 mg Per Tube Q6H  . aspirin EC  325 mg Oral Daily   Or  . aspirin  324 mg Per Tube  Daily  . atorvastatin  80 mg Oral q1800  . bisacodyl  10 mg Oral Daily   Or  . bisacodyl  10 mg Rectal Daily  . chlorhexidine gluconate (MEDLINE KIT)  15 mL Mouth Rinse BID  . docusate sodium  200 mg Oral Daily  . insulin regular  0-10 Units Intravenous TID WC  . mouth rinse  15 mL Mouth Rinse 10 times per day  . metoprolol tartrate  12.5 mg Oral BID   Or  . metoprolol tartrate  12.5 mg Per Tube BID  . [START ON 02/10/2018] pantoprazole  40 mg Oral Daily  . sodium chloride flush  3 mL Intravenous Q12H   Continuous Infusions: . sodium chloride 10 mL/hr at 02/09/18 0800  . sodium chloride Stopped (02/09/18 0519)  . sodium chloride 20 mL/hr at 02/08/18 1635  . albumin human 250 mL (02/08/18 1800)  . cefUROXime (ZINACEF)  IV Stopped (02/09/18 2355)  . dexmedetomidine (PRECEDEX) IV infusion Stopped (02/08/18 2217)  . DOPamine Stopped (02/09/18 0747)  . insulin (NOVOLIN-R) infusion 4.6 mL/hr at 02/09/18 0800  . lactated ringers    . lactated ringers 20 mL/hr at 02/08/18 1636  . lactated ringers 20 mL/hr at 02/09/18 0800  . milrinone 0.3 mcg/kg/min (02/09/18 0800)  . nitroGLYCERIN Stopped (02/08/18 1636)  . norepinephrine (LEVOPHED) Adult infusion 9 mcg/min (02/09/18 0800)  .  phenylephrine (NEO-SYNEPHRINE) Adult infusion Stopped (02/08/18 1636)   PRN Meds:.sodium chloride, albumin human, fentaNYL (SUBLIMAZE) injection, lactated ringers, metoprolol tartrate, midazolam, ondansetron (ZOFRAN) IV, sodium chloride flush, traMADol  General appearance: alert and cooperative Neurologic: intact Heart: regular rate and rhythm, S1, S2 normal, no murmur, click, rub or gallop Lungs: diminished breath sounds bibasilar Abdomen: soft, non-tender; bowel sounds normal; no masses,  no organomegaly Extremities: extremities normal, atraumatic, no cyanosis or edema and Homans sign is negative, no sign of DVT Wound: sternum stable  Lab Results: CBC: Recent Labs    02/08/18 2235 02/09/18 0426  WBC  20.1* 18.1*  HGB 11.2*  11.2* 11.0*  HCT 34.8*  33.0* 34.1*  PLT 127* 123*   BMET:  Recent Labs    02/08/18 0614  02/08/18 2235 02/09/18 0426  NA 137   < > 141 139  K 4.2   < > 4.2 4.2  CL 106   < > 106 109  CO2 22  --   --  23  GLUCOSE 187*   < > 163* 126*  BUN 31*   < > 23 21  CREATININE 1.16   < > 1.10  1.00 1.08  CALCIUM 9.2  --   --  7.7*   < > = values in this interval not displayed.    CMET: Lab Results  Component Value Date   WBC 18.1 (H) 02/09/2018   HGB 11.0 (L) 02/09/2018   HCT 34.1 (L) 02/09/2018   PLT 123 (L) 02/09/2018   GLUCOSE 126 (H) 02/09/2018   ALT 26 02/07/2018   AST 27 02/07/2018   NA 139 02/09/2018   K 4.2 02/09/2018   CL 109 02/09/2018   CREATININE 1.08 02/09/2018   BUN 21 02/09/2018   CO2 23 02/09/2018   INR 1.54 02/08/2018   HGBA1C 9.5 (H) 02/07/2018      PT/INR:  Recent Labs    02/08/18 1649  LABPROT 18.4*  INR 1.54   Radiology: Dg Chest Port 1 View  Result Date: 02/08/2018 CLINICAL DATA:  S/P CABG X 5 EXAM: PORTABLE CHEST 1 VIEW COMPARISON:  02/07/2018 FINDINGS: Interval median sternotomy and CABG. RIGHT IJ Swan-Ganz catheter tip overlies the level of the pulmonary outflow tract. An endotracheal tube has been placed, tip approximately 7.3 centimeters above the carina. A nasogastric tube is in place, tip beyond the gastroesophageal junction. There is minimal bibasilar atelectasis. Heart size is normal. LEFT-sided chest tube. No pneumothorax. IMPRESSION: Postoperative appearance of the chest. Minimal bibasilar atelectasis. Electronically Signed   By: Nolon Nations M.D.   On: 02/08/2018 17:13   Cox 80  Assessment/Plan: S/P Procedure(s) (LRB): CORONARY ARTERY BYPASS GRAFT times five, LIMA-LAD, SVG-DIAG, SVG-OM, SVG-PD-PL(SEQ), using left internal mammary artery and Endoharvest of Right greater saphenous vein (N/A) INTRAOPERATIVE TRANSESOPHAGEAL ECHOCARDIOGRAM (N/A) Mobilize Diuresis Diabetes control Continue foley due to  strict I&O, patient in ICU and urinary output monitoring Expected Acute  Blood - loss Anemia- continue to monitor  Wean milrinone    Grace Isaac 02/09/2018 8:28 AM

## 2018-02-10 ENCOUNTER — Inpatient Hospital Stay (HOSPITAL_COMMUNITY): Payer: Medicare HMO

## 2018-02-10 LAB — COOXEMETRY PANEL
Carboxyhemoglobin: 1.5 % (ref 0.5–1.5)
Methemoglobin: 1.6 % — ABNORMAL HIGH (ref 0.0–1.5)
O2 Saturation: 67.4 %
Total hemoglobin: 8.4 g/dL — ABNORMAL LOW (ref 12.0–16.0)

## 2018-02-10 LAB — BASIC METABOLIC PANEL
Anion gap: 6 (ref 5–15)
BUN: 16 mg/dL (ref 8–23)
CO2: 27 mmol/L (ref 22–32)
Calcium: 7.6 mg/dL — ABNORMAL LOW (ref 8.9–10.3)
Chloride: 104 mmol/L (ref 98–111)
Creatinine, Ser: 0.98 mg/dL (ref 0.61–1.24)
GFR calc Af Amer: >60 mL/min (ref >60)
GFR calc non Af Amer: >60 mL/min (ref >60)
Glucose, Bld: 169 mg/dL — ABNORMAL HIGH (ref 70–99)
Potassium: 3.8 mmol/L (ref 3.5–5.1)
Sodium: 137 mmol/L (ref 135–145)

## 2018-02-10 LAB — CBC
HCT: 29.3 % — ABNORMAL LOW (ref 39.0–52.0)
Hemoglobin: 9.3 g/dL — ABNORMAL LOW (ref 13.0–17.0)
MCH: 28.5 pg (ref 26.0–34.0)
MCHC: 31.7 g/dL (ref 30.0–36.0)
MCV: 89.9 fL (ref 78.0–100.0)
Platelets: 63 10*3/uL — ABNORMAL LOW (ref 150–400)
RBC: 3.26 MIL/uL — ABNORMAL LOW (ref 4.22–5.81)
RDW: 14 % (ref 11.5–15.5)
WBC: 9.9 10*3/uL (ref 4.0–10.5)

## 2018-02-10 LAB — GLUCOSE, CAPILLARY
GLUCOSE-CAPILLARY: 166 mg/dL — AB (ref 70–99)
GLUCOSE-CAPILLARY: 153 mg/dL — AB (ref 70–99)
Glucose-Capillary: 132 mg/dL — ABNORMAL HIGH (ref 70–99)
Glucose-Capillary: 182 mg/dL — ABNORMAL HIGH (ref 70–99)
Glucose-Capillary: 192 mg/dL — ABNORMAL HIGH (ref 70–99)

## 2018-02-10 MED ORDER — INSULIN DETEMIR 100 UNIT/ML ~~LOC~~ SOLN
20.0000 [IU] | Freq: Every day | SUBCUTANEOUS | Status: DC
  Administered 2018-02-11: 20 [IU] via SUBCUTANEOUS
  Filled 2018-02-10 (×2): qty 0.2

## 2018-02-10 MED ORDER — FUROSEMIDE 10 MG/ML IJ SOLN
40.0000 mg | Freq: Once | INTRAMUSCULAR | Status: AC
  Administered 2018-02-10: 40 mg via INTRAVENOUS
  Filled 2018-02-10: qty 4

## 2018-02-10 NOTE — Progress Notes (Signed)
Patient ID: Alexander Parks, male   DOB: 1951/05/03, 67 y.o.   MRN: [id number] EVENING ROUNDS NOTE :     [address]       [place] [address]             [phone]                 2 Days Post-Op Procedure(s) (LRB): CORONARY ARTERY BYPASS GRAFT times five, LIMA-LAD, SVG-DIAG, SVG-OM, SVG-PD-PL(SEQ), using left internal mammary artery and Endoharvest of Right greater saphenous vein (N/A) INTRAOPERATIVE TRANSESOPHAGEAL ECHOCARDIOGRAM (N/A)  Total Length of Stay:  LOS: 3 days  BP 109/65   Pulse (!) 106   Temp 98.2 F (36.8 C) (Oral)   Resp (!) 21   Ht 5\' 11"  (1.803 m)   Wt 235 lb 10.8 oz (106.9 kg)   SpO2 98%   BMI 32.87 kg/m   .Intake/Output      06/28 0701 - 06/29 0700 06/29 0701 - 06/30 0700   P.O. 380 960   I.V. (mL/kg) 850.4 (8) 224.2 (2.1)   Blood     IV Piggyback 99.9    Total Intake(mL/kg) 1330.2 (12.4) 1184.2 (11.1)   Urine (mL/kg/hr) 1940 (0.8) 1545 (1.2)   Emesis/NG output     Blood     Chest Tube 270 0   Total Output 2210 1545   Net -879.8 -360.8          . sodium chloride Stopped (02/09/18 0922)  . sodium chloride Stopped (02/09/18 0519)  . sodium chloride 20 mL/hr at 02/08/18 1635  . dexmedetomidine (PRECEDEX) IV infusion Stopped (02/08/18 2217)  . lactated ringers    . lactated ringers 20 mL/hr at 02/08/18 1636  . lactated ringers 20 mL/hr at 02/10/18 1600  . nitroGLYCERIN Stopped (02/08/18 1636)  . phenylephrine (NEO-SYNEPHRINE) Adult infusion Stopped (02/08/18 1636)     Lab Results  Component Value Date   WBC 9.9 02/10/2018   HGB 9.3 (L) 02/10/2018   HCT 29.3 (L) 02/10/2018   PLT 63 (L) 02/10/2018   GLUCOSE 169 (H) 02/10/2018   ALT 26 02/07/2018   AST 27 02/07/2018   NA 137 02/10/2018   K 3.8 02/10/2018   CL 104 02/10/2018   CREATININE 0.98 02/10/2018   BUN 16 02/10/2018   CO2 27 02/10/2018   INR 1.54 02/08/2018   HGBA1C 9.5 (H) 02/07/2018   Off drips levimir increased for elevated glucose Good  diuresis   Grace Isaac MD  Beeper [phone] Office [phone] 02/10/2018 6:58 PM

## 2018-02-10 NOTE — Progress Notes (Signed)
Patient ID: Alexander Parks, male   DOB: [birth year], 67 y.o.   MRN: [id number] TCTS DAILY ICU PROGRESS NOTE                   [address]            [place] [address]          [phone]   2 Days Post-Op Procedure(s) (LRB): CORONARY ARTERY BYPASS GRAFT times five, LIMA-LAD, SVG-DIAG, SVG-OM, SVG-PD-PL(SEQ), using left internal mammary artery and Endoharvest of Right greater saphenous vein (N/A) INTRAOPERATIVE TRANSESOPHAGEAL ECHOCARDIOGRAM (N/A)  Total Length of Stay:  LOS: 3 days   Subjective: Patient awake alert neurologically intact feels well this morning  Objective: Vital signs in last 24 hours: Temp:  [97.9 F (36.6 C)-98.8 F (37.1 C)] 98.1 F (36.7 C) (06/29 0757) Pulse Rate:  [101-116] 109 (06/29 0800) Cardiac Rhythm: Sinus tachycardia (06/29 0758) Resp:  [0-29] 20 (06/29 0800) BP: (86-121)/(53-74) 101/59 (06/29 0700) SpO2:  [89 %-98 %] 95 % (06/29 0800) Arterial Line BP: (93-144)/(30-74) 98/34 (06/28 1745) Weight:  [235 lb 10.8 oz (106.9 kg)] 235 lb 10.8 oz (106.9 kg) (06/29 0500)  Filed Weights   02/08/18 1700 02/09/18 0500 02/10/18 0500  Weight: 225 lb 1.4 oz (102.1 kg) 238 lb 5.1 oz (108.1 kg) 235 lb 10.8 oz (106.9 kg)    Weight change: 10 lb 9.3 oz (4.8 kg)   Hemodynamic parameters for last 24 hours:    Intake/Output from previous day: 06/28 0701 - 06/29 0700 In: 1330.2 [P.O.:380; I.V.:850.4; IV Piggyback:99.9] Out: 2210 [Urine:1940; Chest Tube:270]  Intake/Output this shift: Total I/O In: 267.6 [P.O.:240; I.V.:27.6] Out: 100 [Urine:100]  Current Meds: Scheduled Meds: . acetaminophen  1,000 mg Oral Q6H   Or  . acetaminophen (TYLENOL) oral liquid 160 mg/5 mL  1,000 mg Per Tube Q6H  . aspirin EC  325 mg Oral Daily   Or  . aspirin  324 mg Per Tube Daily  . atorvastatin  80 mg Oral q1800  . bisacodyl  10 mg Oral Daily   Or  . bisacodyl  10 mg Rectal Daily  . docusate sodium  200 mg Oral Daily  . insulin aspart  0-24 Units  Subcutaneous Q4H  . insulin detemir  15 Units Subcutaneous Daily  . mouth rinse  15 mL Mouth Rinse BID  . metoprolol tartrate  12.5 mg Oral BID   Or  . metoprolol tartrate  12.5 mg Per Tube BID  . pantoprazole  40 mg Oral Daily  . sodium chloride flush  3 mL Intravenous Q12H   Continuous Infusions: . sodium chloride Stopped (02/09/18 0922)  . sodium chloride Stopped (02/09/18 0519)  . sodium chloride 20 mL/hr at 02/08/18 1635  . dexmedetomidine (PRECEDEX) IV infusion Stopped (02/08/18 2217)  . lactated ringers    . lactated ringers 20 mL/hr at 02/08/18 1636  . lactated ringers 20 mL/hr at 02/10/18 0800  . milrinone 0.15 mcg/kg/min (02/10/18 0800)  . nitroGLYCERIN Stopped (02/08/18 1636)  . norepinephrine (LEVOPHED) Adult infusion Stopped (02/09/18 1701)  . phenylephrine (NEO-SYNEPHRINE) Adult infusion Stopped (02/08/18 1636)   PRN Meds:.sodium chloride, fentaNYL (SUBLIMAZE) injection, lactated ringers, metoprolol tartrate, midazolam, ondansetron (ZOFRAN) IV, sodium chloride flush, traMADol  General appearance: alert and cooperative Neurologic: intact Heart: regular rate and rhythm, S1, S2 normal, no murmur, click, rub or gallop Lungs: diminished breath sounds bibasilar Abdomen: soft, non-tender; bowel sounds normal; no masses,  no organomegaly Extremities: extremities normal, atraumatic, no cyanosis or edema and  Homans sign is negative, no sign of DVT Wound: Sternum intact  Lab Results: CBC: Recent Labs    02/09/18 1606 02/09/18 1620 02/10/18 0343  WBC 12.0*  --  9.9  HGB 9.9* 9.9* 9.3*  HCT 30.8* 29.0* 29.3*  PLT 69*  --  63*   BMET:  Recent Labs    02/09/18 0426  02/09/18 1620 02/10/18 0343  NA 139  --  138 137  K 4.2  --  4.3 3.8  CL 109  --  101 104  CO2 23  --   --  27  GLUCOSE 126*  --  189* 169*  BUN 21  --  20 16  CREATININE 1.08   < > 0.90 0.98  CALCIUM 7.7*  --   --  7.6*   < > = values in this interval not displayed.    CMET: Lab Results    Component Value Date   WBC 9.9 02/10/2018   HGB 9.3 (L) 02/10/2018   HCT 29.3 (L) 02/10/2018   PLT 63 (L) 02/10/2018   GLUCOSE 169 (H) 02/10/2018   ALT 26 02/07/2018   AST 27 02/07/2018   NA 137 02/10/2018   K 3.8 02/10/2018   CL 104 02/10/2018   CREATININE 0.98 02/10/2018   BUN 16 02/10/2018   CO2 27 02/10/2018   INR 1.54 02/08/2018   HGBA1C 9.5 (H) 02/07/2018      PT/INR:  Recent Labs    02/08/18 1649  LABPROT 18.4*  INR 1.54   Radiology: Dg Chest Port 1 View  Result Date: 02/10/2018 CLINICAL DATA:  Postoperative evaluation EXAM: PORTABLE CHEST 1 VIEW COMPARISON:  Chest radiograph 02/09/2018. FINDINGS: Right IJ sheath projects over the superior vena cava. Left chest tube in place. Mediastinal drain is present. Monitoring leads overlie the patient. Stable cardiac and mediastinal contours. Low lung volumes. Bibasilar opacities. IMPRESSION: Low lung volumes with basilar atelectasis. Electronically Signed   By: Lovey Newcomer M.D.   On: 02/10/2018 07:57   COOX 67 this a.m.  Assessment/Plan: S/P Procedure(s) (LRB): CORONARY ARTERY BYPASS GRAFT times five, LIMA-LAD, SVG-DIAG, SVG-OM, SVG-PD-PL(SEQ), using left internal mammary artery and Endoharvest of Right greater saphenous vein (N/A) INTRAOPERATIVE TRANSESOPHAGEAL ECHOCARDIOGRAM (N/A) Mobilize Diuresis Thrombocytopenia-we will hold Lovenox until platelet count returns, check HIT Renal function stable Wean milrinone as tolerated  Grace Isaac 02/10/2018 10:08 AM

## 2018-02-11 ENCOUNTER — Inpatient Hospital Stay (HOSPITAL_COMMUNITY): Payer: Medicare HMO

## 2018-02-11 LAB — GLUCOSE, CAPILLARY
GLUCOSE-CAPILLARY: 107 mg/dL — AB (ref 70–99)
GLUCOSE-CAPILLARY: 142 mg/dL — AB (ref 70–99)
Glucose-Capillary: 134 mg/dL — ABNORMAL HIGH (ref 70–99)
Glucose-Capillary: 163 mg/dL — ABNORMAL HIGH (ref 70–99)
Glucose-Capillary: 143 mg/dL — ABNORMAL HIGH (ref 70–99)
Glucose-Capillary: 152 mg/dL — ABNORMAL HIGH (ref 70–99)

## 2018-02-11 LAB — CBC
HCT: 30.5 % — ABNORMAL LOW (ref 39.0–52.0)
Hemoglobin: 9.8 g/dL — ABNORMAL LOW (ref 13.0–17.0)
MCH: 28.7 pg (ref 26.0–34.0)
MCHC: 32.1 g/dL (ref 30.0–36.0)
MCV: 89.4 fL (ref 78.0–100.0)
Platelets: 71 10*3/uL — ABNORMAL LOW (ref 150–400)
RBC: 3.41 MIL/uL — ABNORMAL LOW (ref 4.22–5.81)
RDW: 14.1 % (ref 11.5–15.5)
WBC: 10.2 10*3/uL (ref 4.0–10.5)

## 2018-02-11 LAB — BASIC METABOLIC PANEL
Anion gap: 6 (ref 5–15)
BUN: 18 mg/dL (ref 8–23)
CO2: 26 mmol/L (ref 22–32)
Calcium: 7.9 mg/dL — ABNORMAL LOW (ref 8.9–10.3)
Chloride: 105 mmol/L (ref 98–111)
Creatinine, Ser: 0.98 mg/dL (ref 0.61–1.24)
GFR calc Af Amer: >60 mL/min (ref >60)
GFR calc non Af Amer: >60 mL/min (ref >60)
Glucose, Bld: 149 mg/dL — ABNORMAL HIGH (ref 70–99)
Potassium: 3.6 mmol/L (ref 3.5–5.1)
Sodium: 137 mmol/L (ref 135–145)

## 2018-02-11 LAB — HEPARIN INDUCED PLATELET AB (HIT ANTIBODY): Heparin Induced Plt Ab: 0.106 OD (ref 0.000–0.400)

## 2018-02-11 MED ORDER — SODIUM CHLORIDE 0.9 % IV SOLN: 250.0000 mL | INTRAVENOUS | Status: DC | PRN

## 2018-02-11 MED ORDER — BISACODYL 10 MG RE SUPP: 10.0000 mg | Freq: Every day | RECTAL | Status: DC | PRN

## 2018-02-11 MED ORDER — ONDANSETRON HCL 4 MG/2ML IJ SOLN: 4.0000 mg | Freq: Four times a day (QID) | INTRAMUSCULAR | Status: DC | PRN

## 2018-02-11 MED ORDER — DOCUSATE SODIUM 100 MG PO CAPS
200.0000 mg | ORAL_CAPSULE | Freq: Every day | ORAL | Status: DC
  Administered 2018-02-11 – 2018-02-13 (×3): 200 mg via ORAL
  Filled 2018-02-11 (×3): qty 2

## 2018-02-11 MED ORDER — OXYCODONE HCL 5 MG PO TABS
5.0000 mg | ORAL_TABLET | ORAL | Status: DC | PRN
  Administered 2018-02-11: 10 mg via ORAL
  Filled 2018-02-11: qty 2

## 2018-02-11 MED ORDER — INSULIN ASPART 100 UNIT/ML ~~LOC~~ SOLN
0.0000 [IU] | Freq: Three times a day (TID) | SUBCUTANEOUS | Status: DC
  Administered 2018-02-11 (×2): 2 [IU] via SUBCUTANEOUS
  Administered 2018-02-11 – 2018-02-12 (×2): 4 [IU] via SUBCUTANEOUS
  Administered 2018-02-12 – 2018-02-13 (×3): 2 [IU] via SUBCUTANEOUS

## 2018-02-11 MED ORDER — LISINOPRIL 2.5 MG PO TABS
2.5000 mg | ORAL_TABLET | Freq: Every day | ORAL | Status: DC
  Administered 2018-02-11 – 2018-02-13 (×3): 2.5 mg via ORAL
  Filled 2018-02-11 (×4): qty 1

## 2018-02-11 MED ORDER — SODIUM CHLORIDE 0.9% FLUSH
3.0000 mL | Freq: Two times a day (BID) | INTRAVENOUS | Status: DC
  Administered 2018-02-11 – 2018-02-12 (×3): 3 mL via INTRAVENOUS

## 2018-02-11 MED ORDER — MOVING RIGHT ALONG BOOK
Freq: Once | Status: AC
  Administered 2018-02-11: 1
  Filled 2018-02-11: qty 1

## 2018-02-11 MED ORDER — ASPIRIN EC 325 MG PO TBEC
325.0000 mg | DELAYED_RELEASE_TABLET | Freq: Every day | ORAL | Status: DC
  Administered 2018-02-12 – 2018-02-13 (×2): 325 mg via ORAL
  Filled 2018-02-11 (×2): qty 1

## 2018-02-11 MED ORDER — GUAIFENESIN ER 600 MG PO TB12: 600.0000 mg | ORAL_TABLET | Freq: Two times a day (BID) | ORAL | Status: DC | PRN

## 2018-02-11 MED ORDER — TRAMADOL HCL 50 MG PO TABS: 50.0000 mg | ORAL_TABLET | ORAL | Status: DC | PRN

## 2018-02-11 MED ORDER — ALUM & MAG HYDROXIDE-SIMETH 200-200-20 MG/5ML PO SUSP: 15.0000 mL | ORAL | Status: DC | PRN

## 2018-02-11 MED ORDER — BISACODYL 5 MG PO TBEC: 10.0000 mg | DELAYED_RELEASE_TABLET | Freq: Every day | ORAL | Status: DC | PRN

## 2018-02-11 MED ORDER — SODIUM CHLORIDE 0.9% FLUSH: 3.0000 mL | INTRAVENOUS | Status: DC | PRN

## 2018-02-11 MED ORDER — POTASSIUM CHLORIDE CRYS ER 20 MEQ PO TBCR: 20.0000 meq | EXTENDED_RELEASE_TABLET | ORAL | Status: DC | PRN

## 2018-02-11 MED ORDER — FUROSEMIDE 40 MG PO TABS
40.0000 mg | ORAL_TABLET | Freq: Every day | ORAL | Status: DC
  Administered 2018-02-11 – 2018-02-13 (×3): 40 mg via ORAL
  Filled 2018-02-11 (×3): qty 1

## 2018-02-11 MED ORDER — PANTOPRAZOLE SODIUM 40 MG PO TBEC
40.0000 mg | DELAYED_RELEASE_TABLET | Freq: Every day | ORAL | Status: DC
  Administered 2018-02-11 – 2018-02-13 (×3): 40 mg via ORAL
  Filled 2018-02-11 (×3): qty 1

## 2018-02-11 MED ORDER — ACETAMINOPHEN 325 MG PO TABS
650.0000 mg | ORAL_TABLET | Freq: Four times a day (QID) | ORAL | Status: DC | PRN
Start: 2018-02-11 — End: 2018-02-13

## 2018-02-11 MED ORDER — METFORMIN HCL ER 500 MG PO TB24
1000.0000 mg | ORAL_TABLET | Freq: Two times a day (BID) | ORAL | Status: DC
  Administered 2018-02-11 – 2018-02-13 (×5): 1000 mg via ORAL
  Filled 2018-02-11 (×7): qty 2

## 2018-02-11 MED ORDER — CARVEDILOL 6.25 MG PO TABS
6.2500 mg | ORAL_TABLET | Freq: Two times a day (BID) | ORAL | Status: DC
  Administered 2018-02-11 – 2018-02-13 (×5): 6.25 mg via ORAL
  Filled 2018-02-11 (×5): qty 1

## 2018-02-11 MED ORDER — POTASSIUM CHLORIDE CRYS ER 20 MEQ PO TBCR
20.0000 meq | EXTENDED_RELEASE_TABLET | Freq: Every day | ORAL | Status: DC
  Administered 2018-02-11 – 2018-02-13 (×3): 20 meq via ORAL
  Filled 2018-02-11 (×3): qty 1

## 2018-02-11 MED ORDER — ONDANSETRON HCL 4 MG PO TABS: 4.0000 mg | ORAL_TABLET | Freq: Four times a day (QID) | ORAL | Status: DC | PRN

## 2018-02-11 NOTE — Progress Notes (Signed)
Patient ID: Alexander Parks, male   DOB: 10/29/1950, 67 y.o.   MRN: [id number] EVENING ROUNDS NOTE :     [address]       [place] [address]             [phone]                 3 Days Post-Op Procedure(s) (LRB): CORONARY ARTERY BYPASS GRAFT times five, LIMA-LAD, SVG-DIAG, SVG-OM, SVG-PD-PL(SEQ), using left internal mammary artery and Endoharvest of Right greater saphenous vein (N/A) INTRAOPERATIVE TRANSESOPHAGEAL ECHOCARDIOGRAM (N/A)  Total Length of Stay:  LOS: 4 days  BP 109/62   Pulse 97   Temp 99.1 F (37.3 C) (Oral)   Resp (!) 21   Ht 5\' 11"  (1.803 m)   Wt 233 lb 7.5 oz (105.9 kg)   SpO2 97%   BMI 32.56 kg/m   .Intake/Output      06/30 0701 - 07/01 0700   P.O. 600   I.V. (mL/kg)    Total Intake(mL/kg) 600 (5.7)   Urine (mL/kg/hr) 0 (0)   Stool 1   Chest Tube    Total Output 1   Net +599       Urine Occurrence 1 x     . sodium chloride       Lab Results  Component Value Date   WBC 10.2 02/11/2018   HGB 9.8 (L) 02/11/2018   HCT 30.5 (L) 02/11/2018   PLT 71 (L) 02/11/2018   GLUCOSE 149 (H) 02/11/2018   ALT 26 02/07/2018   AST 27 02/07/2018   NA 137 02/11/2018   K 3.6 02/11/2018   CL 105 02/11/2018   CREATININE 0.98 02/11/2018   BUN 18 02/11/2018   CO2 26 02/11/2018   INR 1.54 02/08/2018   HGBA1C 9.5 (H) 02/07/2018   Stable day Waiting for 4e bed    Grace Isaac MD  Beeper [phone] Office [phone] 02/11/2018 7:04 PM

## 2018-02-11 NOTE — Progress Notes (Signed)
Patient ID: Alexander Parks, male   DOB: 10/20/1950, 67 y.o.   MRN: [id number] TCTS DAILY ICU PROGRESS NOTE                   [address]            [place] [address]          [phone]   3 Days Post-Op Procedure(s) (LRB): CORONARY ARTERY BYPASS GRAFT times five, LIMA-LAD, SVG-DIAG, SVG-OM, SVG-PD-PL(SEQ), using left internal mammary artery and Endoharvest of Right greater saphenous vein (N/A) INTRAOPERATIVE TRANSESOPHAGEAL ECHOCARDIOGRAM (N/A)  Total Length of Stay:  LOS: 4 days   Subjective: Patient up in chair walked around the unit this morning  Objective: Vital signs in last 24 hours: Temp:  [97.5 F (36.4 C)-99.1 F (37.3 C)] 97.5 F (36.4 C) (06/30 0741) Pulse Rate:  [82-115] 94 (06/30 0700) Cardiac Rhythm: Normal sinus rhythm (06/30 0400) Resp:  [12-23] 18 (06/30 0700) BP: (103-122)/(58-76) 118/71 (06/30 0700) SpO2:  [90 %-98 %] 96 % (06/30 0700) Weight:  [233 lb 7.5 oz (105.9 kg)] 233 lb 7.5 oz (105.9 kg) (06/30 0600)  Filed Weights   02/09/18 0500 02/10/18 0500 02/11/18 0600  Weight: 238 lb 5.1 oz (108.1 kg) 235 lb 10.8 oz (106.9 kg) 233 lb 7.5 oz (105.9 kg)    Weight change: -2 lb 3.3 oz (-1 kg)   Hemodynamic parameters for last 24 hours:    Intake/Output from previous day: 06/29 0701 - 06/30 0700 In: 1373.1 [P.O.:960; I.V.:413.1] Out: 2255 [Urine:2255]  Intake/Output this shift: No intake/output data recorded.  Current Meds: Scheduled Meds: . acetaminophen  1,000 mg Oral Q6H   Or  . acetaminophen (TYLENOL) oral liquid 160 mg/5 mL  1,000 mg Per Tube Q6H  . aspirin EC  325 mg Oral Daily   Or  . aspirin  324 mg Per Tube Daily  . atorvastatin  80 mg Oral q1800  . bisacodyl  10 mg Oral Daily   Or  . bisacodyl  10 mg Rectal Daily  . docusate sodium  200 mg Oral Daily  . insulin aspart  0-24 Units Subcutaneous Q4H  . insulin detemir  20 Units Subcutaneous Daily  . mouth rinse  15 mL Mouth Rinse BID  . metoprolol tartrate  12.5  mg Oral BID   Or  . metoprolol tartrate  12.5 mg Per Tube BID  . pantoprazole  40 mg Oral Daily  . sodium chloride flush  3 mL Intravenous Q12H   Continuous Infusions: . sodium chloride Stopped (02/09/18 0922)  . sodium chloride Stopped (02/09/18 0519)  . sodium chloride 20 mL/hr at 02/08/18 1635  . dexmedetomidine (PRECEDEX) IV infusion Stopped (02/08/18 2217)  . lactated ringers    . lactated ringers 20 mL/hr at 02/08/18 1636  . lactated ringers 20 mL/hr at 02/10/18 1900  . nitroGLYCERIN Stopped (02/08/18 1636)  . phenylephrine (NEO-SYNEPHRINE) Adult infusion Stopped (02/08/18 1636)   PRN Meds:.sodium chloride, fentaNYL (SUBLIMAZE) injection, lactated ringers, metoprolol tartrate, midazolam, ondansetron (ZOFRAN) IV, sodium chloride flush, traMADol  General appearance: alert, cooperative and no distress Neurologic: intact Heart: regular rate and rhythm, S1, S2 normal, no murmur, click, rub or gallop Lungs: diminished breath sounds bibasilar Abdomen: soft, non-tender; bowel sounds normal; no masses,  no organomegaly Extremities: extremities normal, atraumatic, no cyanosis or edema and Homans sign is negative, no sign of DVT Wound: Sternum stable  Lab Results: CBC: Recent Labs    02/10/18 0343 02/11/18 0656  WBC 9.9 10.2  HGB 9.3* 9.8*  HCT 29.3* 30.5*  PLT 63* 71*   BMET:  Recent Labs    02/10/18 0343 02/11/18 0656  NA 137 137  K 3.8 3.6  CL 104 105  CO2 27 26  GLUCOSE 169* 149*  BUN 16 18  CREATININE 0.98 0.98  CALCIUM 7.6* 7.9*    CMET: Lab Results  Component Value Date   WBC 10.2 02/11/2018   HGB 9.8 (L) 02/11/2018   HCT 30.5 (L) 02/11/2018   PLT 71 (L) 02/11/2018   GLUCOSE 149 (H) 02/11/2018   ALT 26 02/07/2018   AST 27 02/07/2018   NA 137 02/11/2018   K 3.6 02/11/2018   CL 105 02/11/2018   CREATININE 0.98 02/11/2018   BUN 18 02/11/2018   CO2 26 02/11/2018   INR 1.54 02/08/2018   HGBA1C 9.5 (H) 02/07/2018      PT/INR:  Recent Labs     02/08/18 1649  LABPROT 18.4*  INR 1.54   Radiology: No results found.   Assessment/Plan: S/P Procedure(s) (LRB): CORONARY ARTERY BYPASS GRAFT times five, LIMA-LAD, SVG-DIAG, SVG-OM, SVG-PD-PL(SEQ), using left internal mammary artery and Endoharvest of Right greater saphenous vein (N/A) INTRAOPERATIVE TRANSESOPHAGEAL ECHOCARDIOGRAM (N/A) Mobilize Diuresis Diabetes control d/c tubes/lines  Preoperatively patient had significant congestive heart failure symptoms Thrombocytopenia is slowly improving, HIT still pending, avoiding heparin Transfer to 4 E.   Grace Isaac 02/11/2018 8:06 AM

## 2018-02-11 NOTE — Progress Notes (Signed)
Spoke to [employer] re d/c cvc order.  RN aware.

## 2018-02-11 NOTE — Plan of Care (Signed)
  Problem: Clinical Measurements: Goal: Ability to maintain clinical measurements within normal limits will improve Outcome: Progressing Goal: Will remain free from infection Outcome: Progressing Goal: Diagnostic test results will improve Outcome: Progressing Goal: Respiratory complications will improve Outcome: Progressing Goal: Cardiovascular complication will be avoided Outcome: Progressing   Problem: Activity: Goal: Risk for activity intolerance will decrease Outcome: Progressing   Problem: Nutrition: Goal: Adequate nutrition will be maintained Outcome: Progressing   Problem: Coping: Goal: Level of anxiety will decrease Outcome: Progressing   Problem: Elimination: Goal: Will not experience complications related to bowel motility Outcome: Progressing Goal: Will not experience complications related to urinary retention Outcome: Progressing   Problem: Pain Managment: Goal: General experience of comfort will improve Outcome: Progressing   Problem: Safety: Goal: Ability to remain free from injury will improve Outcome: Progressing   Problem: Skin Integrity: Goal: Risk for impaired skin integrity will decrease Outcome: Progressing   Problem: Activity: Goal: Capacity to carry out activities will improve Outcome: Progressing   Problem: Cardiac: Goal: Ability to achieve and maintain adequate cardiopulmonary perfusion will improve Outcome: Progressing   Problem: Cardiac: Goal: Hemodynamic stability will improve Outcome: Progressing   Problem: Clinical Measurements: Goal: Postoperative complications will be avoided or minimized Outcome: Progressing   Problem: Respiratory: Goal: Respiratory status will improve Outcome: Progressing   Problem: Skin Integrity: Goal: Wound healing without signs and symptoms of infection Outcome: Progressing Goal: Risk for impaired skin integrity will decrease Outcome: Progressing   Problem: Urinary Elimination: Goal: Ability to  achieve and maintain adequate renal perfusion and functioning will improve Outcome: Progressing

## 2018-02-12 ENCOUNTER — Inpatient Hospital Stay (HOSPITAL_COMMUNITY): Payer: Medicare HMO

## 2018-02-12 DIAGNOSIS — I25810 Atherosclerosis of coronary artery bypass graft(s) without angina pectoris: Secondary | ICD-10-CM

## 2018-02-12 LAB — BASIC METABOLIC PANEL
Anion gap: 8 (ref 5–15)
BUN: 23 mg/dL (ref 8–23)
CO2: 23 mmol/L (ref 22–32)
Calcium: 7.9 mg/dL — ABNORMAL LOW (ref 8.9–10.3)
Chloride: 106 mmol/L (ref 98–111)
Creatinine, Ser: 0.95 mg/dL (ref 0.61–1.24)
GFR calc Af Amer: >60 mL/min (ref >60)
GFR calc non Af Amer: >60 mL/min (ref >60)
Glucose, Bld: 139 mg/dL — ABNORMAL HIGH (ref 70–99)
Potassium: 4.5 mmol/L (ref 3.5–5.1)
Sodium: 137 mmol/L (ref 135–145)

## 2018-02-12 LAB — CBC
HCT: 29.9 % — ABNORMAL LOW (ref 39.0–52.0)
Hemoglobin: 9.4 g/dL — ABNORMAL LOW (ref 13.0–17.0)
MCH: 28.2 pg (ref 26.0–34.0)
MCHC: 31.4 g/dL (ref 30.0–36.0)
MCV: 89.8 fL (ref 78.0–100.0)
Platelets: 66 10*3/uL — ABNORMAL LOW (ref 150–400)
RBC: 3.33 MIL/uL — ABNORMAL LOW (ref 4.22–5.81)
RDW: 14 % (ref 11.5–15.5)
WBC: 10.2 10*3/uL (ref 4.0–10.5)

## 2018-02-12 LAB — GLUCOSE, CAPILLARY
GLUCOSE-CAPILLARY: 197 mg/dL — AB (ref 70–99)
GLUCOSE-CAPILLARY: 140 mg/dL — AB (ref 70–99)
Glucose-Capillary: 143 mg/dL — ABNORMAL HIGH (ref 70–99)
Glucose-Capillary: 135 mg/dL — ABNORMAL HIGH (ref 70–99)
Glucose-Capillary: 118 mg/dL — ABNORMAL HIGH (ref 70–99)

## 2018-02-12 MED ORDER — LIVING WELL WITH DIABETES BOOK
Freq: Once | Status: AC
  Administered 2018-02-12: 19:00:00
  Filled 2018-02-12: qty 1

## 2018-02-12 NOTE — Progress Notes (Signed)
Pt arrived to the floor from [place] on wheel chair. Alert and oriented and ambulate to the bed. Wife at the bed side. Heart monitor applied from [facility] confirmed. Pt oriented to bed and equipment. Call bell within reach.

## 2018-02-12 NOTE — Plan of Care (Signed)
  Problem: Education: Goal: Knowledge of General Education information will improve Outcome: Progressing   Problem: Health Behavior/Discharge Planning: Goal: Ability to manage health-related needs will improve Outcome: Progressing   Problem: Clinical Measurements: Goal: Will remain free from infection Outcome: Progressing   Problem: Activity: Goal: Risk for activity intolerance will decrease Outcome: Progressing   Problem: Nutrition: Goal: Adequate nutrition will be maintained Outcome: Progressing   

## 2018-02-12 NOTE — Progress Notes (Signed)
CARDIAC REHAB PHASE I   PRE:  Rate/Rhythm: 96 SR  BP:  Supine: 119/59  Sitting:   Standing:    SaO2: 97%RA  MODE:  Ambulation: 800 ft   POST:  Rate/Rhythm: 110 ST  BP:  Supine: 129/70  Sitting:   Standing:    SaO2: 96%RA 0850-0920 Pt walked 800 ft on RA with rolling walker and little assistance. Pt not sure if he will need walker at discharge and did not want to try without it this walk. Encouraged more walks today and IS. To bed for pacing wire removal.   Graylon Good, RN BSN  02/12/2018 9:15 AM

## 2018-02-12 NOTE — Progress Notes (Signed)
   Cardiology on service note.  CABG 02/08/2018: LIMA to LAD, SVG to diagonal, SVG OM, and SVG sequential PDA and PL.  Monitor with normal sinus rhythm.  Will follow.

## 2018-02-12 NOTE — Discharge Summary (Signed)
Physician Discharge Summary       [address]       [place] [address]             [phone]    Patient ID: Alexander Parks MRN: [id number] DOB/AGE: 67/27/1952 67 y.o.  Admit date: 02/07/2018 Discharge date: 02/13/2018  Admission Diagnoses: 1. CAD (coronary artery disease) 2. Ischemic cardiomyopathy 3. Chronic systolic heart failure ([facility]) Discharge Diagnoses:  1. S/P CABG x 5 2. ABL anemia 3. Thrombocytopenia 4. Tobacco abuse 5. History of DM without complication ([facility])  Procedure (s):  Burnell Blanks, MD (Primary)    Procedures   RIGHT/LEFT HEART CATH AND CORONARY ANGIOGRAPHY By Dr. Angelena Form on 02/07/2018:  Conclusion     Prox RCA lesion is 99% stenosed.  Post Atrio lesion is 99% stenosed.  Ost RPDA lesion is 50% stenosed.  Mid RCA lesion is 20% stenosed.  Ost LM to Mid LM lesion is 90% stenosed.  Mid LM to Dist LM lesion is 60% stenosed.  Prox Cx to Mid Cx lesion is 99% stenosed.  Prox LAD lesion is 99% stenosed.  Ost 1st Diag lesion is 80% stenosed.  Ost LAD to Prox LAD lesion is 30% stenosed.   1. Severe triple vessel CAD  2. Severe ostial left main stenosis 3. Severe stenosis mid LAD and in the small to moderate caliber diagonal branch 4. Severe stenosis in the mid Circumflex leading into the large obtuse marginal branch 5. Severe stenosis in the proximal segment of the large dominant RCA. Severe stenosis in the posterolateral branch.  6. LVEF 35-40% by echo  Recommendations: Will admit to telemetry unit and consult CT surgery for CABG. He is having no chest pain or dyspnea. Will start ASA and high intensity statin. Continue beta blocker, Ace-inh. Will start IV heparin 6 hours post sheath pull.   Coronary artery bypass grafting x5 with the left internal mammary to the left anterior descending coronary artery, reverse saphenous vein graft to the diagonal coronary artery, reverse saphenous vein graft to the obtuse  marginal  coronary artery, sequential reverse saphenous vein graft to the posterior descending and posterolateral branches of the right coronary artery with right greater saphenous endoscopic vein harvesting thigh and calf on 02/08/2018 by Dr. Servando Snare.   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  His symptoms have progressed to the point where he is unable to even pump gas into his truck, or walk across a parking lot without becoming short of breath.  Echocardiogram in May showed decreased LV function with ejection fraction of 35 to 40%.  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  Patient came in today for elective cardiac catheterization and found to have high-grade left main and three-vessel coronary artery disease.  He has known diabetes, renal insufficiency most likely related to his long-term diabetes, quit smoking 15 years ago With the patient's symptoms of progressive congestive heart failure and severe three-vessel coronary artery disease including left main, his best treatment option includes coronary artery bypass grafting on this admission.  Risks and options are discussed with the patient and his wife in detail. Patient agrees to proceed with surgery. Pre operative carotid duplex US showed no significant internal carotid artery stenosis bilaterally. Patient underwent a CABG x 5 on 02/08/2018.   Brief Hospital Course:  The patient was extubated early the  morning of post operative day one without difficulty. He remained afebrile and hemodynamically stable. He was weaned off Milrinone, Dopamine, and Nor epinephrine drips. Gordy Councilman, a line, chest tubes, and foley were removed early in the post operative course. Coreg was started and titrated accordingly. He was volume over loaded and diuresed. He had ABL anemia. He did not require a  post op transfusion. Last H and H was 9.2 and 28.1. He had thrombocytopenia post op. 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 His last platelet count was up to 102,000. He was weaned off the insulin drip.  Once he was tolerating a diet, home diabetic medicines were restarted.  The patient's glucose remained well controlled.The patient's HGA1C pre op was 9.5. He will need close follow up with his medical doctor after discharge. The patient was felt surgically stable for transfer from the ICU to PCTU for further convalescence on 02/11/2018. He continues to progress with cardiac rehab. He was ambulating on room air. He has been tolerating a diet and has had a bowel movement. Epicardial pacing wires were removed on 02/12/2018. As discussed with Dr. Servando Snare, he will need Lasix 40 mg daily until seen by [place] in the office. His potassium upon admission was 4.8 and he was not on a potassium supplement prior to admission. Will not discharge on potassium supplement as potassium yesterday 4.5. Will check BMET as an outpatient. Chest tube sutures will be removed the day of discharge. The patient is felt surgically stable for discharge today.   Latest Vital Signs: Blood pressure 110/60, pulse 91, temperature 98.4 F (36.9 C), resp. rate 18, height 5\' 11"  (1.803 m), weight 229 lb 14.4 oz (104.3 kg), SpO2 93 %.  Physical Exam: Cardiovascular: RRR Pulmonary: Clear to auscultation bilaterally Abdomen: Soft, non tender, bowel sounds present. Extremities: Mild bilateral lower extremity edema. Wounds: Clean and dry.  No erythema or signs of infection.  Discharge Condition:Stable and discharge to home.  Recent laboratory studies:  Lab Results  Component Value Date   WBC 9.6 02/13/2018   HGB 9.2 (L) 02/13/2018   HCT 28.1 (L) 02/13/2018   MCV 87.5 02/13/2018   PLT 102 (L) 02/13/2018   Lab Results  Component Value Date   NA 137 02/12/2018   K 4.5 02/12/2018   CL 106 02/12/2018   CO2 23 02/12/2018    CREATININE 0.95 02/12/2018   GLUCOSE 139 (H) 02/12/2018    Diagnostic Studies: Dg Chest 2 View  Result Date: 02/12/2018 CLINICAL DATA:  Postop check.  Bypass surgery EXAM: CHEST - 2 VIEW COMPARISON:  Yesterday FINDINGS: Right IJ sheath has been removed. Stable cardiomegaly. CABG. Mildly improved lung volumes. There is mild atelectasis and trace effusions. Negative for pneumothorax. IMPRESSION: Mildly improved aeration.  Trace pleural effusions. Electronically Signed   By: Monte Fantasia M.D.   On: 02/12/2018 07:32   Discharge Medications: Allergies as of 02/13/2018      Reactions   Benadryl [diphenhydramine Hcl] Swelling   Iodinated Diagnostic Agents Other (See Comments)   Unknown   Morphine And Related Other (See Comments)   Unknown      Medication List    STOP taking these medications   BC HEADACHE POWDER PO   metoprolol succinate 25 MG 24 hr tablet Commonly known as:  TOPROL XL   predniSONE 50 MG tablet Commonly known as:  DELTASONE     TAKE these medications   acetaminophen 325 MG tablet Commonly known as:  TYLENOL Take 2 tablets (650 mg total) by mouth  every 6 (six) hours as needed for mild pain.   aspirin 325 MG EC tablet Take 1 tablet (325 mg total) by mouth daily.   atorvastatin 80 MG tablet Commonly known as:  LIPITOR Take 1 tablet (80 mg total) by mouth daily at 6 PM.   carvedilol 6.25 MG tablet Commonly known as:  COREG Take 1 tablet (6.25 mg total) by mouth 2 (two) times daily with a meal.   furosemide 40 MG tablet Commonly known as:  LASIX Take 1 tablet (40 mg total) by mouth daily. What changed:  additional instructions   lisinopril 2.5 MG tablet Commonly known as:  PRINIVIL,ZESTRIL Take 1 tablet (2.5 mg total) by mouth daily.   metFORMIN 500 MG 24 hr tablet Commonly known as:  GLUCOPHAGE-XR Take 1,000 mg by mouth 2 (two) times daily.   oxyCODONE 5 MG immediate release tablet Commonly known as:  Oxy IR/ROXICODONE Take 5 mg by mouth every 4-6  hours PRN severe pain      The patient has been discharged on:   1.Beta Blocker:  Yes [ x  ]                              No   [   ]                              If No, reason:  2.Ace Inhibitor/ARB: Yes [ x  ]                                     No  [    ]                                     If No, reason:  3.Statin:   Yes [ x  ]                  No  [   ]                  If No, reason:  4.Ecasa:  Yes  [ x  ]                  No   [   ]                  If No, reason:  Follow Up Appointments: Follow-up Information    Grace Isaac, MD. Go on 03/19/2018.   Specialty:  Cardiothoracic Surgery Why:  PA/LAT CXR to be taken (at [facility] which is in the same building as Dr. Everrett Coombe office) on 28/12/2017 at 12:30 pm;Appointment time is at 1:00 pm Contact information: [place] [address] [phone]        Manon Hilding, MD. Call.   Specialty:  Family Medicine Why:  for a follow up appointment regarding further diabetes management and surveillance of HGA1C 9.5 Contact information: [place] [address] [phone]        Arnoldo Lenis, MD. Go on 03/06/2018.   Specialty:  Cardiology Why:  Appointment time is at 9:00 am Contact information: [place] [address] [phone]  SignedSharalyn Ink ZimmermanPA-C 02/13/2018, 11:21 AM

## 2018-02-12 NOTE — Op Note (Signed)
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  SURGICAL PROCEDURE:  Coronary artery bypass grafting x5 with the left internal mammary to the left anterior descending coronary artery, reverse saphenous vein graft to the diagonal coronary artery, reverse saphenous vein graft to the obtuse marginal  coronary artery, sequential reverse saphenous vein graft to the posterior descending and posterolateral branches of the right coronary artery with right greater saphenous endoscopic vein harvesting thigh and calf.  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  His symptoms continued to worsen.  An echocardiogram was done in May that showed a decreased ejection fraction of approximately 30%.  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  Because of the  decreased ejection fraction, further workup was undertaken including cardiac catheterization.  This demonstrated severe 3-vessel coronary artery disease with depressed LV function.  Risks and options of surgical intervention were discussed with the  patient, and he agreed and signed informed consent to proceed with coronary artery bypass grafting risk.  DESCRIPTION OF PROCEDURE:  With Swan-Ganz and  arterial line monitors in place, the patient underwent general endotracheal anesthesia without incident.  The skin of the chest and the legs was prepped with Betadine and draped in the usual sterile manner.   Transesophageal probe was placed by anesthesia, and this demonstrated significant decrease in LV function greater than appreciated on his echo in May.  Appropriate timeout was performed, and we proceeded with endoscopic vein harvesting of the right  greater saphenous vein from the thigh and calf.  Median sternotomy was performed.  The left internal mammary artery was dissected down to the pedicle graft.  The distal artery was divided.  It had good free flow.  Pericardium was opened.  Overall,  ventricular function was depressed with significant enlargement of the right and left ventricle.  The patient was systemically heparinized.  The ascending aorta was cannulated.  The right atrium was cannulated.  An aortic root vent cardioplegia needle  was introduced into the ascending aorta.  The patient was placed on cardiopulmonary bypass 2.4 L/min per sq m.  Sites of anastomoses were selected and dissected at the epicardium.  The patient's body temperature was cooled to 32 degrees.  The aortic  cross-clamp was applied, and 800 mL of cold blood cardioplegia was administered with diastolic arrest of the heart and myocardial septal temperatures monitored throughout the cross-clamp.  Attention was then turned first to the right coronary artery,  which was heavily calcified.  The posterior descending and posterior lateral branches of the right coronary artery were suitable for bypass but slightly small.  The very distal posterior lateral branch was less than 1 mm and not large enough to bypass.   The posterior descending vessel was opened and admitted a 1 mm probe proximally and distally using a diamond-type side-to-side anastomosis with a running 8-0 Prolene.  A segment of vein was anastomosed to the PD.  The  distal extent of the same vein was  then carried to the posterolateral branch which was of similar size and admitted a 1 mm probe.  Using a running 8-0 Prolene, a distal anastomosis was performed.  Additional cold blood cardioplegia was administered down the vein grafts.  The heart was  then elevated, and the first larger intermediate vessel supplying the lateral wall did not have severe stenosis.  The distal circumflex was identified and was much larger than anticipated on the films.  The specimen was opened and admitted a 1.5 mm  probe.  Using a running 7-0 Prolene, distal anastomosis was performed.  Attention was then turned to the diagonal coronary artery, which was opened and admitted a 1 mm probe.  Using a running 7-0 Prolene, distal anastomosis was performed.  Attention was  then turned to the left anterior descending coronary artery, which was opened in the distal third of the vessel using running 8-0 Prolene.  The left internal mammary artery was anastomosed to the left anterior descending coronary artery.  With  cross-clamp still in place, 3 punch aortotomies were performed and each of the 3 vein grafts were anastomosed to the ascending aorta.  The bulldog was removed from the mammary artery with prompt rise in myocardial septal temperature.  The heart was  allowed to passively fill and deair.  The proximal anastomoses were completed, and the cross-clamp was removed with a total cross-clamp time of 104 minutes.  The patient returned to a sinus rhythm.  Atrial and ventricular pacing wires were applied and  atrially paced increased rate.  He was started on dopamine and milrinone infusions with the body temperature rewarmed to 37 degrees.  He was then ventilated and weaned from cardiopulmonary bypass.  He remained hemodynamically stable.  Echocardiogram  showed overall some improvement in global contractility.  There were no specific wall motion abnormalities.  He was decannulated in the usual  fashion.  Protamine sulfate was administered without operative field hemostatic.  Graft markers were applied to  the left pleural tube, and Blake mediastinal drains were left in place.  The pericardium was loosely reapproximated.  The sternum was closed with #6 stainless steel wire.  The fascia was closed with interrupted 0 Vicryl, running 3-0 Vicryl and  subcutaneous tissue through a subcuticular stitch in skin edges.  Dry dressings were applied.  Sponge and needle count was reported as correct at completion of the procedure.  The patient tolerated the procedure without obvious complication.  He was  transferred to the surgical intensive care unit for further postoperative care.  He did not require any blood bank blood products during the procedure.  PA Jadene Pierini assisted with vein harvesting, exposure, cannulation, decannulation and construction of  anastomoses.  LN/NUANCE  D:02/12/2018 T:02/12/2018 JOB:[id number]

## 2018-02-12 NOTE — Progress Notes (Signed)
Pacer wires removed. All wires intact. Bedrest 1 hr, call light within reach. vitals every 15 mins. Alexander Parks

## 2018-02-12 NOTE — Addendum Note (Signed)
Addendum  created 02/12/18 1815 by Oleta Mouse, MD   Child order released for a procedure order, Intraprocedure Blocks edited, LDA created via procedure documentation, Pend clinical note, Sign clinical note

## 2018-02-12 NOTE — Progress Notes (Addendum)
      [address]       Alexander Parks,Alexander Parks [id number]             [phone]        4 Days Post-Op Procedure(s) (LRB): CORONARY ARTERY BYPASS GRAFT times five, LIMA-LAD, SVG-DIAG, SVG-OM, SVG-PD-PL(SEQ), using left internal mammary artery and Endoharvest of Right greater saphenous vein (N/A) INTRAOPERATIVE TRANSESOPHAGEAL ECHOCARDIOGRAM (N/A)  Subjective: Patient eating breakfast this am. He has no specific complaints.  Objective: Vital signs in last 24 hours: Temp:  [97.5 F (36.4 C)-99.8 F (37.7 C)] 98.3 F (36.8 C) (07/01 0326) Pulse Rate:  [93-101] 93 (07/01 0326) Cardiac Rhythm: Normal sinus rhythm (06/30 1240) Resp:  [15-21] 18 (07/01 0326) BP: (103-117)/(59-68) 108/68 (07/01 0326) SpO2:  [92 %-98 %] 96 % (07/01 0326) Weight:  [233 lb 1.8 oz (105.7 kg)] 233 lb 1.8 oz (105.7 kg) (07/01 0326)  Pre op weight 102.1  kg Current Weight  02/12/18 233 lb 1.8 oz (105.7 kg)      Intake/Output from previous day: 06/30 0701 - 07/01 0700 In: 840 [P.O.:840] Out: 301 [Urine:300; Stool:1]   Physical Exam:  Cardiovascular: RRR Pulmonary: Clear to auscultation bilaterally Abdomen: Soft, non tender, bowel sounds present. Extremities: Mild bilateral lower extremity edema. Wounds: Clean and dry.  No erythema or signs of infection.  Lab Results: CBC: Recent Labs    02/11/18 0656 02/12/18 0232  WBC 10.2 10.2  HGB 9.8* 9.4*  HCT 30.5* 29.9*  PLT 71* 66*   BMET:  Recent Labs    02/11/18 0656 02/12/18 0232  NA 137 137  K 3.6 4.5  CL 105 106  CO2 26 23  GLUCOSE 149* 139*  BUN 18 23  CREATININE 0.98 0.95  CALCIUM 7.9* 7.9*    PT/INR:  Lab Results  Component Value Date   INR 1.54 02/08/2018   INR 1.22 02/07/2018   ABG:  INR: Will add last result for INR, ABG once components are confirmed Will add last 4 CBG results once components are confirmed  Assessment/Plan:  1. CV - SR, first degree heart block. On Coreg 6.25 mg bid and Lisinopril 2.5 mg  daily. 2.  Pulmonary - On room air this am. CXR this shows no pneumothorax, stable cardiomegaly, and small pleural effusions. Encourage incentive spirometer. 3. Volume Overload - On Lasix 40 mg daily 4.  Acute blood loss anemia - H and H slightly decreased to 9.4 and 29.9 this am. 5. Thrombocytopenia-platelets slightly decreased to 66,000. Not on Lovenox.  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 Recheck CBC in am 6. DM-CBGs 143/152/143. On Metformin 1,000 mg bid. Pre op HGA1C 9.5. He will need close follow up with medical doctor after discharge. 7. Remove EPW  Jules Baty M ZimmermanPA-C 02/12/2018,7:48 AM [phone]

## 2018-02-12 NOTE — Anesthesia Procedure Notes (Signed)
Central Venous Catheter Insertion Performed by: Oleta Mouse, MD, anesthesiologist Start/End6/27/2019 9:36 AM, 02/08/2018 9:45 AM Patient location: OR. Preanesthetic checklist: patient identified, IV checked, site marked, risks and benefits discussed, surgical consent, monitors and equipment checked, pre-op evaluation, timeout performed and anesthesia consent Hand hygiene performed  and maximum sterile barriers used  PA cath was placed.Swan type:thermodilution Procedure performed without using ultrasound guided technique. Attempts: 1 Patient tolerated the procedure well with no immediate complications.

## 2018-02-12 NOTE — Plan of Care (Signed)
  Problem: Education: Goal: Knowledge of General Education information will improve Outcome: Progressing   Problem: Health Behavior/Discharge Planning: Goal: Ability to manage health-related needs will improve Outcome: Progressing   Problem: Clinical Measurements: Goal: Ability to maintain clinical measurements within normal limits will improve Outcome: Progressing Goal: Will remain free from infection Outcome: Progressing Goal: Diagnostic test results will improve Outcome: Progressing Goal: Respiratory complications will improve Outcome: Progressing Goal: Cardiovascular complication will be avoided Outcome: Progressing   Problem: Activity: Goal: Risk for activity intolerance will decrease Outcome: Progressing   Problem: Nutrition: Goal: Adequate nutrition will be maintained Outcome: Progressing   Problem: Coping: Goal: Level of anxiety will decrease Outcome: Progressing   Problem: Elimination: Goal: Will not experience complications related to bowel motility Outcome: Progressing Goal: Will not experience complications related to urinary retention Outcome: Progressing   Problem: Pain Managment: Goal: General experience of comfort will improve Outcome: Progressing   Problem: Safety: Goal: Ability to remain free from injury will improve Outcome: Progressing   Problem: Skin Integrity: Goal: Risk for impaired skin integrity will decrease Outcome: Progressing   Problem: Activity: Goal: Capacity to carry out activities will improve Outcome: Progressing   Problem: Cardiac: Goal: Ability to achieve and maintain adequate cardiopulmonary perfusion will improve Outcome: Progressing   Problem: Education: Goal: Knowledge of disease or condition will improve Outcome: Progressing   Problem: Activity: Goal: Risk for activity intolerance will decrease Outcome: Progressing   Problem: Cardiac: Goal: Hemodynamic stability will improve Outcome: Progressing   Problem:  Clinical Measurements: Goal: Postoperative complications will be avoided or minimized Outcome: Progressing   Problem: Respiratory: Goal: Respiratory status will improve Outcome: Progressing   Problem: Skin Integrity: Goal: Wound healing without signs and symptoms of infection Outcome: Progressing Goal: Risk for impaired skin integrity will decrease Outcome: Progressing   Problem: Urinary Elimination: Goal: Ability to achieve and maintain adequate renal perfusion and functioning will improve Outcome: Progressing

## 2018-02-12 NOTE — Anesthesia Procedure Notes (Signed)
Central Venous Catheter Insertion Performed by: Oleta Mouse, MD, anesthesiologist Start/End6/27/2019 9:36 AM, 02/08/2018 9:45 AM Patient location: OR. Preanesthetic checklist: patient identified, IV checked, site marked, risks and benefits discussed, surgical consent, monitors and equipment checked, pre-op evaluation, timeout performed and anesthesia consent Position: supine Hand hygiene performed  and maximum sterile barriers used  Catheter size: 9 Fr Total catheter length 10. MAC introducer Procedure performed using ultrasound guided technique. Ultrasound Notes:anatomy identified, needle tip was noted to be adjacent to the nerve/plexus identified, no ultrasound evidence of intravascular and/or intraneural injection and image(s) printed for medical record Attempts: 1 Following insertion, line sutured, dressing applied and Biopatch. Post procedure assessment: blood return through all ports, free fluid flow and no air  Patient tolerated the procedure well with no immediate complications.

## 2018-02-12 NOTE — Discharge Instructions (Signed)

## 2018-02-12 NOTE — Progress Notes (Signed)
Inpatient Diabetes Program Recommendations  AACE/ADA: New Consensus Statement on Inpatient Glycemic Control (2015)  Target Ranges:  Prepandial:   less than 140 mg/dL      Peak postprandial:   less than 180 mg/dL (1-2 hours)      Critically ill patients:  140 - 180 mg/dL   Lab Results  Component Value Date   GLUCAP 135 (H) 02/12/2018   HGBA1C 9.5 (H) 02/07/2018    Review of Glycemic Control  Diabetes history: DM2 Outpatient Diabetes medications: metformin 1000 mg bid Current orders for Inpatient glycemic control: Novolog 0-24 units tidwc and hs, metformin 1000 mg bid  HgbA1C - 9.5% uncontrolled.  Spoke with pt and wife regarding HgbA1C of 9.5%. Pt states he drinks a lot of water and diet soda, but tends to eat large portions of potatoes, starchy vegies. Denies any real exercise. Checks blood sugars several times/week at home. Stressed importance of controlling blood sugars to prevent long-term complications. Discussed how diet, exercise and stress affect blood sugars. Answered questions and pt will follow-up with PCP.   Will continue to follow.   Thank you. Lorenda Peck, RD, LDN, CDE Inpatient Diabetes Coordinator [phone]

## 2018-02-13 LAB — CBC
HEMATOCRIT: 28.1 % — AB (ref 39.0–52.0)
HEMOGLOBIN: 9.2 g/dL — AB (ref 13.0–17.0)
MCH: 28.7 pg (ref 26.0–34.0)
MCHC: 32.7 g/dL (ref 30.0–36.0)
MCV: 87.5 fL (ref 78.0–100.0)
Platelets: 102 10*3/uL — ABNORMAL LOW (ref 150–400)
RBC: 3.21 MIL/uL — AB (ref 4.22–5.81)
RDW: 14 % (ref 11.5–15.5)
WBC: 9.6 10*3/uL (ref 4.0–10.5)

## 2018-02-13 LAB — GLUCOSE, CAPILLARY
GLUCOSE-CAPILLARY: 148 mg/dL — AB (ref 70–99)
GLUCOSE-CAPILLARY: 183 mg/dL — AB (ref 70–99)

## 2018-02-13 MED ORDER — LISINOPRIL 2.5 MG PO TABS: 2.5000 mg | ORAL_TABLET | Freq: Every day | ORAL | 1 refills | Status: DC

## 2018-02-13 MED ORDER — LIVING WELL WITH DIABETES BOOK
Freq: Once | Status: AC
  Filled 2018-02-13: qty 1

## 2018-02-13 MED ORDER — OXYCODONE HCL 5 MG PO TABS: ORAL_TABLET | ORAL | 0 refills | Status: DC

## 2018-02-13 MED ORDER — ACETAMINOPHEN 325 MG PO TABS: 650.0000 mg | ORAL_TABLET | Freq: Four times a day (QID) | ORAL | Status: DC | PRN

## 2018-02-13 MED ORDER — CARVEDILOL 6.25 MG PO TABS: 6.2500 mg | ORAL_TABLET | Freq: Two times a day (BID) | ORAL | 1 refills | Status: DC

## 2018-02-13 MED ORDER — FUROSEMIDE 40 MG PO TABS: 40.0000 mg | ORAL_TABLET | Freq: Every day | ORAL | Status: DC

## 2018-02-13 MED ORDER — ATORVASTATIN CALCIUM 80 MG PO TABS: 80.0000 mg | ORAL_TABLET | Freq: Every day | ORAL | 1 refills | Status: AC

## 2018-02-13 MED ORDER — ASPIRIN 325 MG PO TBEC: 325.0000 mg | DELAYED_RELEASE_TABLET | Freq: Every day | ORAL | 0 refills | Status: DC

## 2018-02-13 MED ORDER — FUROSEMIDE 40 MG PO TABS: 40.0000 mg | ORAL_TABLET | Freq: Every day | ORAL | 0 refills | Status: AC

## 2018-02-13 NOTE — Progress Notes (Signed)
CARDIAC REHAB PHASE I   PRE:  Rate/Rhythm: 89 SR  BP:  Supine: 110/60  Sitting:   Standing:    SaO2: 95%RA  MODE:  Ambulation: 810 ft   POST:  Rate/Rhythm: 108 ST  BP:  Supine:   Sitting: 108/77  Standing:    SaO2: 99%RA 0922-0955 Pt walked 810 ft on RA with hand held asst and tolerated well. Did not need walker. To recliner after walk with call bell. Will review ed with pt and wife when she arrives in case pt discharged today.   Graylon Good, RN BSN  02/13/2018 9:50 AM

## 2018-02-13 NOTE — Care Management Important Message (Signed)
Important Message  Patient Details  Name: Alexander Parks MRN: [id number] Date of Birth: July 20, 1951   Medicare Important Message Given:  Yes    Arva Slaugh P [place] 02/13/2018, 1:29 PM

## 2018-02-13 NOTE — Progress Notes (Addendum)
      [address]       [place] [address]             [phone]        5 Days Post-Op Procedure(s) (LRB): CORONARY ARTERY BYPASS GRAFT times five, LIMA-LAD, SVG-DIAG, SVG-OM, SVG-PD-PL(SEQ), using left internal mammary artery and Endoharvest of Right greater saphenous vein (N/A) INTRAOPERATIVE TRANSESOPHAGEAL ECHOCARDIOGRAM (N/A)  Subjective: Patient has no specific complaints.  Objective: Vital signs in last 24 hours: Temp:  [97.7 F (36.5 C)-99.1 F (37.3 C)] 98.9 F (37.2 C) (07/02 0416) Pulse Rate:  [87-97] 90 (07/02 0416) Cardiac Rhythm: Normal sinus rhythm (07/01 2200) Resp:  [14-22] 17 (07/02 0416) BP: (99-129)/(55-92) 119/64 (07/02 0416) SpO2:  [93 %-96 %] 93 % (07/02 0416) Weight:  [229 lb 14.4 oz (104.3 kg)] 229 lb 14.4 oz (104.3 kg) (07/02 0416)  Pre op weight 102.1  kg Current Weight  02/13/18 229 lb 14.4 oz (104.3 kg)      Intake/Output from previous day: 07/01 0701 - 07/02 0700 In: 904 [P.O.:904] Out: 1025 [Urine:1025]   Physical Exam:  Cardiovascular: RRR Pulmonary: Clear to auscultation bilaterally Abdomen: Soft, non tender, bowel sounds present. Extremities: Mild bilateral lower extremity edema. Wounds: Clean and dry.  No erythema or signs of infection.  Lab Results: CBC: Recent Labs    02/12/18 0232 02/13/18 0248  WBC 10.2 9.6  HGB 9.4* 9.2*  HCT 29.9* 28.1*  PLT 66* 102*   BMET:  Recent Labs    02/11/18 0656 02/12/18 0232  NA 137 137  K 3.6 4.5  CL 105 106  CO2 26 23  GLUCOSE 149* 139*  BUN 18 23  CREATININE 0.98 0.95  CALCIUM 7.9* 7.9*    PT/INR:  Lab Results  Component Value Date   INR 1.54 02/08/2018   INR 1.22 02/07/2018   ABG:  INR: Will add last result for INR, ABG once components are confirmed Will add last 4 CBG results once components are confirmed  Assessment/Plan:  1. CV - SR, first degree heart block. On Coreg 6.25 mg bid and Lisinopril 2.5 mg daily. 2.  Pulmonary - On room air  this am. Encourage incentive spirometer. 3. Volume Overload - On Lasix 40 mg daily. As discussed with Dr. Servando Snare, will remain on Lasix until seen in office as has reduced LVEF 4.  Acute blood loss anemia - H and H slightly decreased to 9.2 and 28.1 this am. 5. Thrombocytopenia-platelets increased to 102,000. Not on Lovenox.  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  6. DM-CBGs 118/140/148. On Metformin 1,000 mg bid. Pre op HGA1C 9.5. He will need close follow up with medical doctor after discharge. 7. As discussed with Dr. Servando Snare, will discharge  [facility] 02/13/2018,7:28 AM [phone]

## 2018-02-13 NOTE — Care Management Note (Signed)
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 Plan to return home, no CM needs noted for transition to home.   Expected Discharge Date:  02/13/18               Expected Discharge Plan:  Home/Self Care  In-House Referral:  NA  Discharge planning Services  CM Consult  Post Acute Care Choice:  NA Choice offered to:  NA  DME Arranged:    DME Agency:     HH Arranged:    HH Agency:     Status of Service:  Completed, signed off  If discussed at [place] of Stay Meetings, dates discussed:    Discharge Disposition: home/self care   Additional Comments:  Dawayne Patricia, RN 02/13/2018, 11:19 AM

## 2018-02-13 NOTE — Progress Notes (Signed)
Pt received slip with instructions for lab draw. Pt instructed to take to primary MD office when he has his follow-up appointment. Pt and pt's wife verbalized understanding.   Ara Kussmaul BSN, RN

## 2018-02-13 NOTE — Progress Notes (Signed)
[id number] Education completed with pt and wife who voiced understanding. Reviewed sternal precautions and remaining in the tube. Encouraged IS. Gave heart healthy and diabetic diets. Discussed healthy food choices and low carb as pt stated he has mainly been eating what he wants. Offered to put on discharge video but they are not interested at this time. Discussed CRP 2 and referred to [place].  Graylon Good RN BSN 02/13/2018 11:27 AM

## 2018-02-13 NOTE — Progress Notes (Signed)
Pt sutures removed. Iv's removed. All sites clean, dry, intact. Education provided. Paper prescriptions given to wife with discharge instructions. Pt belongings with wife. Volunteers called to transport pt for discharge [facility]

## 2018-02-13 NOTE — Progress Notes (Signed)
CARDIOLOGY RECOMMENDATIONS:  Discharge is anticipated in the next 48 hours. Recommendations for medications and follow up: High intensity statin therapy with atorvastatin 80 mg/day, aspirin 325 mg/day, beta-blocker therapy with carvedilol 6.25 mg twice daily, Toprol dose may be further optimized as an outpatient but for now 2.5 mg/day.  Discharge Medications: Continue medications as they are currently listed in the [facility]. Exceptions to the above:  None  Follow Up: The patient's Primary Cardiologist is Carlyle Dolly, MD 03/06/2018  Follow up in the office in 2-3 week(s).  Signed,  Sinclair Grooms, MD  1:07 PM 02/13/2018  [facility]

## 2018-02-16 DIAGNOSIS — R197 Diarrhea, unspecified: Secondary | ICD-10-CM | POA: Diagnosis not present

## 2018-02-16 DIAGNOSIS — E1165 Type 2 diabetes mellitus with hyperglycemia: Secondary | ICD-10-CM | POA: Diagnosis not present

## 2018-02-16 DIAGNOSIS — I2510 Atherosclerotic heart disease of native coronary artery without angina pectoris: Secondary | ICD-10-CM | POA: Diagnosis not present

## 2018-02-16 DIAGNOSIS — E782 Mixed hyperlipidemia: Secondary | ICD-10-CM | POA: Diagnosis not present

## 2018-02-16 DIAGNOSIS — Z6832 Body mass index (BMI) 32.0-32.9, adult: Secondary | ICD-10-CM | POA: Diagnosis not present

## 2018-02-16 DIAGNOSIS — I5021 Acute systolic (congestive) heart failure: Secondary | ICD-10-CM | POA: Diagnosis not present

## 2018-03-06 ENCOUNTER — Ambulatory Visit: Payer: Medicare HMO | Admitting: Cardiology

## 2018-03-06 ENCOUNTER — Encounter: Payer: Self-pay | Admitting: Cardiology

## 2018-03-06 VITALS — BP 119/77 | HR 90 | Ht 74.0 in | Wt 206.6 lb

## 2018-03-06 DIAGNOSIS — I5022 Chronic systolic (congestive) heart failure: Secondary | ICD-10-CM

## 2018-03-06 DIAGNOSIS — I2510 Atherosclerotic heart disease of native coronary artery without angina pectoris: Secondary | ICD-10-CM | POA: Diagnosis not present

## 2018-03-06 NOTE — Progress Notes (Signed)
Clinical Summary Mr. Milner is a 67 y.o.male  seen today for follow up of the following medical problems.   1. Chronic systolic HF/ICM/CAD - pcp note from 01/10/18 reports recent leg edema, SOB.  - CXR 01/04/18 with pulm edema - 12/2017 echo LVEF 35-40%, mild to mod MR  - 01/2018 cath as reported below, severe triple vessel disease. - 02/08/18 CABG x 5 (LIMA-LAD, SVG-diag, SVG-OM, SVg-PDA and PL sequential)   - no recent SOB/DOE, no edema, some incision site.  - compliant with meds. Occasional orthostatic symptoms.    Labs pending August 1  SH: Forensic scientist     Past Medical History:  Diagnosis Date  . Allergy   . Diabetes mellitus without complication (HCC)      Allergies  Allergen Reactions  . Benadryl [Diphenhydramine Hcl] Swelling  . Iodinated Diagnostic Agents Other (See Comments)    Unknown  . Morphine And Related Other (See Comments)    Unknown     Current Outpatient Medications  Medication Sig Dispense Refill  . acetaminophen (TYLENOL) 325 MG tablet Take 2 tablets (650 mg total) by mouth every 6 (six) hours as needed for mild pain.    Marland Kitchen aspirin EC 325 MG EC tablet Take 1 tablet (325 mg total) by mouth daily. 30 tablet 0  . atorvastatin (LIPITOR) 80 MG tablet Take 1 tablet (80 mg total) by mouth daily at 6 PM. 30 tablet 1  . carvedilol (COREG) 6.25 MG tablet Take 1 tablet (6.25 mg total) by mouth 2 (two) times daily with a meal. 60 tablet 1  . furosemide (LASIX) 40 MG tablet Take 1 tablet (40 mg total) by mouth daily. 30 tablet 0  . lisinopril (PRINIVIL,ZESTRIL) 2.5 MG tablet Take 1 tablet (2.5 mg total) by mouth daily. 30 tablet 1  . metFORMIN (GLUCOPHAGE-XR) 500 MG 24 hr tablet Take 1,000 mg by mouth 2 (two) times daily.     Marland Kitchen oxyCODONE (OXY IR/ROXICODONE) 5 MG immediate release tablet Take 5 mg by mouth every 4-6 hours PRN severe pain 28 tablet 0   No current facility-administered medications for this visit.      Past Surgical  History:  Procedure Laterality Date  . BALLOON DILATION  08/21/2012   Procedure: BALLOON DILATION;  Surgeon: Marissa Nestle, MD;  Location: [facility];  Service: Urology;  Laterality: Right;  Balloon Dilation Right Ureter  . CORONARY ARTERY BYPASS GRAFT N/A 02/08/2018   Procedure: CORONARY ARTERY BYPASS GRAFT times five, LIMA-LAD, SVG-DIAG, SVG-OM, SVG-PD-PL(SEQ), using left internal mammary artery and Endoharvest of Right greater saphenous vein;  Surgeon: Grace Isaac, MD;  Location: [place];  Service: Open Heart Surgery;  Laterality: N/A;  . CYSTOSCOPY W/ URETERAL STENT PLACEMENT  08/21/2012   Procedure: CYSTOSCOPY WITH RETROGRADE PYELOGRAM/URETERAL STENT PLACEMENT;  Surgeon: Marissa Nestle, MD;  Location: [facility];  Service: Urology;  Laterality: Right;  Cystoscopy with Right Retrograde Pyelogram, Right Ureteral Stent Placement  . HERNIA REPAIR    . HOLMIUM LASER APPLICATION  03/22/8675   Procedure: HOLMIUM LASER APPLICATION;  Surgeon: Marissa Nestle, MD;  Location: [facility];  Service: Urology;  Laterality: Right;  Holmium Laser Application to Right Ureteral Calculus  . INTRAOPERATIVE TRANSESOPHAGEAL ECHOCARDIOGRAM N/A 02/08/2018   Procedure: INTRAOPERATIVE TRANSESOPHAGEAL ECHOCARDIOGRAM;  Surgeon: Grace Isaac, MD;  Location: [facility];  Service: Open Heart Surgery;  Laterality: N/A;  . RIGHT/LEFT HEART CATH AND CORONARY ANGIOGRAPHY N/A 02/07/2018   Procedure: RIGHT/LEFT HEART CATH AND CORONARY ANGIOGRAPHY;  Surgeon:  Burnell Blanks, MD;  Location: [place] [facility];  Service: Cardiovascular;  Laterality: N/A;  . STONE EXTRACTION WITH BASKET  08/21/2012   Procedure: STONE EXTRACTION WITH BASKET;  Surgeon: Marissa Nestle, MD;  Location: [facility];  Service: Urology;  Laterality: Right;  Right Ureteral Stone Basket Extraction     Allergies  Allergen Reactions  . Benadryl [Diphenhydramine Hcl] Swelling  . Iodinated Diagnostic Agents Other (See Comments)    Unknown  . Morphine And  Related Other (See Comments)    Unknown      Family History  Problem Relation Age of Onset  . Cancer Mother   . Cancer Sister   . Liver disease Brother      Social History Mr. Bartnick reports that he quit smoking about 21 years ago. His smoking use included cigarettes. He has a 30.00 pack-year smoking history. He has never used smokeless tobacco. Mr. Goldsborough reports that he does not drink alcohol.   Review of Systems CONSTITUTIONAL: No weight loss, fever, chills, weakness or fatigue.  HEENT: Eyes: No visual loss, blurred vision, double vision or yellow sclerae.No hearing loss, sneezing, congestion, runny nose or sore throat.  SKIN: No rash or itching.  CARDIOVASCULAR: per hpi RESPIRATORY: per hpi GASTROINTESTINAL: No anorexia, nausea, vomiting or diarrhea. No abdominal pain or blood.  GENITOURINARY: No burning on urination, no polyuria NEUROLOGICAL: No headache, dizziness, syncope, paralysis, ataxia, numbness or tingling in the extremities. No change in bowel or bladder control.  MUSCULOSKELETAL: No muscle, back pain, joint pain or stiffness.  LYMPHATICS: No enlarged nodes. No history of splenectomy.  PSYCHIATRIC: No history of depression or anxiety.  ENDOCRINOLOGIC: No reports of sweating, cold or heat intolerance. No polyuria or polydipsia.  Marland Kitchen   Physical Examination Vitals:   03/06/18 0842  BP: 119/77  Pulse: 90   Vitals:   03/06/18 0842  Weight: 206 lb 9.6 oz (93.7 kg)  Height: 6\' 2"  (1.88 m)    Gen: resting comfortably, no acute distress HEENT: no scleral icterus, pupils equal round and reactive, no palptable cervical adenopathy,  CV: RRR, no m/r/g, no jvd Resp: Clear to auscultation bilaterally GI: abdomen is soft, non-tender, non-distended, normal bowel sounds, no hepatosplenomegaly MSK: extremities are warm, no edema.  Skin: warm, no rash Neuro:  no focal deficits Psych: appropriate affect   Diagnostic Studies 12/2017 echo Study Conclusions  - Left  ventricle: The cavity size was normal. Wall thickness was normal. Systolic function was moderately reduced. The estimated ejection fraction was in the range of 35% to 40%. Diffuse hypokinesis. The study is not technically sufficient to allow evaluation of LV diastolic function. - Aortic valve: Mildly calcified annulus. Trileaflet; mildly thickened leaflets. There was mild regurgitation. Valve area (VTI): 2.49 cm^2. Valve area (Vmax): 2.43 cm^2. Valve area (Vmean): 2.09 cm^2. - Mitral valve: There was mild to moderate regurgitation. - Left atrium: The atrium was moderately dilated. - Right ventricle: The cavity size was mildly to moderately dilated. Systolic function was mildly reduced. - Right atrium: The atrium was mildly dilated. - Pulmonary arteries: Systolic pressure was mildly increased. PA peak pressure: 34 mm Hg (S). - Pericardium, extracardiac: There is a left pleural effusion.  6/2019cath  Prox RCA lesion is 99% stenosed.  Post Atrio lesion is 99% stenosed.  Ost RPDA lesion is 50% stenosed.  Mid RCA lesion is 20% stenosed.  Ost LM to Mid LM lesion is 90% stenosed.  Mid LM to Dist LM lesion is 60% stenosed.  Prox Cx to Mid  Cx lesion is 99% stenosed.  Prox LAD lesion is 99% stenosed.  Ost 1st Diag lesion is 80% stenosed.  Ost LAD to Prox LAD lesion is 30% stenosed.   1. Severe triple vessel CAD  2. Severe ostial left main stenosis 3. Severe stenosis mid LAD and in the small to moderate caliber diagonal Wadsworth Skolnick 4. Severe stenosis in the mid Circumflex leading into the large obtuse marginal Mishon Blubaugh 5. Severe stenosis in the proximal segment of the large dominant RCA. Severe stenosis in the posterolateral Rawad Bochicchio.  6. LVEF 35-40% by echo  Post cath Recommendations: Will admit to telemetry unit and consult CT surgery for CABG. He is having no chest pain or dyspnea. Will start ASA and high intensity statin. Continue beta blocker, Ace-inh. Will start  IV heparin 6 hours post sheath pull.   01/2018 Carotid US 1-39% bilateral disease   01/2018 ABI Normal bilateral  Assessment and Plan  1. Chronic systolic HF/ICM - recent diagnosis, LVEF 35-40%. Cath with multivessel CAD, s/p CABG - doing well without symptoms. Previous low bp's and occasaional orthostatic symptoms limit medical therapy. If bp's stabel next visit titrate up coreg, would also lower dose of ASA to 81mg  next visit - refer to cardiac rehab    F/u 6 weeks.        Arnoldo Lenis, M.D.

## 2018-03-06 NOTE — Patient Instructions (Signed)
Your physician recommends that you schedule a follow-up appointment in: [place] recommends that you continue on your current medications as directed. Please refer to the Current Medication list given to you today.  You have been referred to CARDIAC REHAB  Thank you for choosing [facility]!!

## 2018-03-13 ENCOUNTER — Emergency Department (HOSPITAL_COMMUNITY): Payer: Medicare HMO

## 2018-03-13 ENCOUNTER — Encounter (HOSPITAL_COMMUNITY): Payer: Self-pay | Admitting: Emergency Medicine

## 2018-03-13 ENCOUNTER — Other Ambulatory Visit: Payer: Self-pay

## 2018-03-13 ENCOUNTER — Emergency Department (HOSPITAL_COMMUNITY)
Admission: EM | Admit: 2018-03-13 | Discharge: 2018-03-13 | Disposition: A | Discharge status: Home / Self Care | Payer: Medicare HMO | Attending: Emergency Medicine | Admitting: Emergency Medicine

## 2018-03-13 DIAGNOSIS — Z79899 Other long term (current) drug therapy: Secondary | ICD-10-CM | POA: Diagnosis not present

## 2018-03-13 DIAGNOSIS — I2510 Atherosclerotic heart disease of native coronary artery without angina pectoris: Secondary | ICD-10-CM | POA: Insufficient documentation

## 2018-03-13 DIAGNOSIS — R1084 Generalized abdominal pain: Secondary | ICD-10-CM | POA: Insufficient documentation

## 2018-03-13 DIAGNOSIS — I5022 Chronic systolic (congestive) heart failure: Secondary | ICD-10-CM | POA: Diagnosis not present

## 2018-03-13 DIAGNOSIS — R0789 Other chest pain: Secondary | ICD-10-CM

## 2018-03-13 DIAGNOSIS — Z7984 Long term (current) use of oral hypoglycemic drugs: Secondary | ICD-10-CM | POA: Diagnosis not present

## 2018-03-13 DIAGNOSIS — Z87891 Personal history of nicotine dependence: Secondary | ICD-10-CM | POA: Diagnosis not present

## 2018-03-13 DIAGNOSIS — R079 Chest pain, unspecified: Secondary | ICD-10-CM | POA: Diagnosis not present

## 2018-03-13 DIAGNOSIS — I712 Thoracic aortic aneurysm, without rupture: Secondary | ICD-10-CM | POA: Diagnosis not present

## 2018-03-13 DIAGNOSIS — E119 Type 2 diabetes mellitus without complications: Secondary | ICD-10-CM | POA: Insufficient documentation

## 2018-03-13 DIAGNOSIS — K8020 Calculus of gallbladder without cholecystitis without obstruction: Secondary | ICD-10-CM | POA: Diagnosis not present

## 2018-03-13 DIAGNOSIS — Z7982 Long term (current) use of aspirin: Secondary | ICD-10-CM | POA: Diagnosis not present

## 2018-03-13 DIAGNOSIS — Z951 Presence of aortocoronary bypass graft: Secondary | ICD-10-CM | POA: Insufficient documentation

## 2018-03-13 LAB — BASIC METABOLIC PANEL
Anion gap: 14 (ref 5–15)
BUN: 23 mg/dL (ref 8–23)
CO2: 24 mmol/L (ref 22–32)
Calcium: 9.8 mg/dL (ref 8.9–10.3)
Chloride: 97 mmol/L — ABNORMAL LOW (ref 98–111)
Creatinine, Ser: 1 mg/dL (ref 0.61–1.24)
GFR calc Af Amer: >60 mL/min (ref >60)
GLUCOSE: 172 mg/dL — AB (ref 70–99)
Potassium: 4.1 mmol/L (ref 3.5–5.1)
SODIUM: 135 mmol/L (ref 135–145)

## 2018-03-13 LAB — CBC
HEMATOCRIT: 36.6 % — AB (ref 39.0–52.0)
Hemoglobin: 12.1 g/dL — ABNORMAL LOW (ref 13.0–17.0)
MCH: 28.5 pg (ref 26.0–34.0)
MCHC: 33.1 g/dL (ref 30.0–36.0)
MCV: 86.1 fL (ref 78.0–100.0)
Platelets: 176 10*3/uL (ref 150–400)
RBC: 4.25 MIL/uL (ref 4.22–5.81)
RDW: 15.2 % (ref 11.5–15.5)
WBC: 11.2 10*3/uL — AB (ref 4.0–10.5)

## 2018-03-13 LAB — CBG MONITORING, ED: GLUCOSE-CAPILLARY: 173 mg/dL — AB (ref 70–99)

## 2018-03-13 LAB — I-STAT TROPONIN, ED
Troponin i, poc: 0 ng/mL (ref 0.00–0.08)
Troponin i, poc: 0 ng/mL (ref 0.00–0.08)

## 2018-03-13 MED ORDER — METHYLPREDNISOLONE SODIUM SUCC 125 MG IJ SOLR
125.0000 mg | Freq: Once | INTRAMUSCULAR | Status: AC
  Administered 2018-03-13: 125 mg via INTRAVENOUS
  Filled 2018-03-13: qty 2

## 2018-03-13 MED ORDER — ONDANSETRON HCL 4 MG/2ML IJ SOLN
INTRAMUSCULAR | Status: AC
  Filled 2018-03-13: qty 2

## 2018-03-13 MED ORDER — FENTANYL CITRATE (PF) 100 MCG/2ML IJ SOLN
100.0000 ug | Freq: Once | INTRAMUSCULAR | Status: AC
  Administered 2018-03-13: 100 ug via INTRAVENOUS
  Filled 2018-03-13: qty 2

## 2018-03-13 MED ORDER — IOPAMIDOL (ISOVUE-370) INJECTION 76%
75.0000 mL | Freq: Once | INTRAVENOUS | Status: AC | PRN
  Administered 2018-03-13: 75 mL via INTRAVENOUS

## 2018-03-13 MED ORDER — ONDANSETRON HCL 4 MG/2ML IJ SOLN
4.0000 mg | Freq: Once | INTRAMUSCULAR | Status: AC
  Administered 2018-03-13: 4 mg via INTRAVENOUS

## 2018-03-13 NOTE — ED Triage Notes (Addendum)
Pt had bypass June 27th. Woke up at 8 am this morning c/o fo pain all over body with n/v

## 2018-03-13 NOTE — ED Notes (Signed)
Pt has gone to sleep and O2 sats have dropped to 89%. Have started pt on 2L [place] and sats up to 93%.

## 2018-03-13 NOTE — ED Notes (Signed)
Pt to CT Angio.  

## 2018-03-13 NOTE — ED Notes (Signed)
Pt to xray

## 2018-03-13 NOTE — Discharge Instructions (Signed)
Your pain is not due to trouble with the major vessels and your surgery site looks good. Please return if you have worsening or new pain.

## 2018-03-13 NOTE — ED Notes (Signed)
Pt back from CT

## 2018-03-13 NOTE — ED Provider Notes (Signed)
[facility] EMERGENCY DEPARTMENT Provider Note   CSN: [id number] Arrival date & time: 03/13/18  1036   History   Chief Complaint Chief Complaint  Patient presents with  . Chest Pain    HPI Alexander Parks is a 67 y.o. male.  HPI  Patient presents today with his wife for diffuse chest and abdominal pain.  He is moaning in pain and says "just shoot me." He cannot localize the pain to anywhere other than his trunk and chest.  He had a CABG on 6/27.  He states this began suddenly at 8 AM this morning.  Never had any pain this severe before per his report.  No shortness of breath.  Has vomited twice prior to arrival.  His wife states he has been having loose stools since discharge from hospital.  No sick contacts, no fever.  Past Medical History:  Diagnosis Date  . Allergy   . Diabetes mellitus without complication [facility])     Patient Active Problem List   Diagnosis Date Noted  . S/P CABG x 5 02/08/2018  . CAD (coronary artery disease) 02/07/2018  . Chronic systolic heart failure ([place])   . Coronary artery disease involving native coronary artery of native heart without angina pectoris   . Ischemic cardiomyopathy   . Acute systolic heart failure ([place]) 01/10/2018    Past Surgical History:  Procedure Laterality Date  . BALLOON DILATION  08/21/2012   Procedure: BALLOON DILATION;  Surgeon: Marissa Nestle, MD;  Location: [facility];  Service: Urology;  Laterality: Right;  Balloon Dilation Right Ureter  . CORONARY ARTERY BYPASS GRAFT N/A 02/08/2018   Procedure: CORONARY ARTERY BYPASS GRAFT times five, LIMA-LAD, SVG-DIAG, SVG-OM, SVG-PD-PL(SEQ), using left internal mammary artery and Endoharvest of Right greater saphenous vein;  Surgeon: Grace Isaac, MD;  Location: [place];  Service: Open Heart Surgery;  Laterality: N/A;  . CYSTOSCOPY W/ URETERAL STENT PLACEMENT  08/21/2012   Procedure: CYSTOSCOPY WITH RETROGRADE PYELOGRAM/URETERAL STENT PLACEMENT;  Surgeon: Marissa Nestle, MD;   Location: [facility];  Service: Urology;  Laterality: Right;  Cystoscopy with Right Retrograde Pyelogram, Right Ureteral Stent Placement  . HERNIA REPAIR    . HOLMIUM LASER APPLICATION  10/18/5730   Procedure: HOLMIUM LASER APPLICATION;  Surgeon: Marissa Nestle, MD;  Location: [facility];  Service: Urology;  Laterality: Right;  Holmium Laser Application to Right Ureteral Calculus  . INTRAOPERATIVE TRANSESOPHAGEAL ECHOCARDIOGRAM N/A 02/08/2018   Procedure: INTRAOPERATIVE TRANSESOPHAGEAL ECHOCARDIOGRAM;  Surgeon: Grace Isaac, MD;  Location: [facility];  Service: Open Heart Surgery;  Laterality: N/A;  . RIGHT/LEFT HEART CATH AND CORONARY ANGIOGRAPHY N/A 02/07/2018   Procedure: RIGHT/LEFT HEART CATH AND CORONARY ANGIOGRAPHY;  Surgeon: Burnell Blanks, MD;  Location: [place] [facility];  Service: Cardiovascular;  Laterality: N/A;  . STONE EXTRACTION WITH BASKET  08/21/2012   Procedure: STONE EXTRACTION WITH BASKET;  Surgeon: Marissa Nestle, MD;  Location: [facility];  Service: Urology;  Laterality: Right;  Right Ureteral Stone Basket Extraction        Home Medications    Prior to Admission medications   Medication Sig Start Date End Date Taking? Authorizing Provider  acetaminophen (TYLENOL) 325 MG tablet Take 2 tablets (650 mg total) by mouth every 6 (six) hours as needed for mild pain. 02/13/18  Yes Lars Pinks M, PA-C  aspirin EC 325 MG EC tablet Take 1 tablet (325 mg total) by mouth daily. 02/13/18  Yes Lars Pinks M, PA-C  atorvastatin (LIPITOR) 80 MG tablet  Take 1 tablet (80 mg total) by mouth daily at 6 PM. 02/13/18  Yes Lars Pinks M, PA-C  carvedilol (COREG) 6.25 MG tablet Take 1 tablet (6.25 mg total) by mouth 2 (two) times daily with a meal. 02/13/18  Yes Lars Pinks M, PA-C  furosemide (LASIX) 40 MG tablet Take 1 tablet (40 mg total) by mouth daily. 02/13/18  Yes Lars Pinks M, PA-C  lisinopril (PRINIVIL,ZESTRIL) 2.5 MG tablet Take 1 tablet (2.5 mg total) by  mouth daily. 02/13/18  Yes Lars Pinks M, PA-C  metFORMIN (GLUCOPHAGE-XR) 500 MG 24 hr tablet Take 1,000 mg by mouth 2 (two) times daily.    Yes [provider]  oxyCODONE (OXY IR/ROXICODONE) 5 MG immediate release tablet Take 5 mg by mouth every 4 (four) hours as needed for severe pain.   Yes [provider]    Family History Family History  Problem Relation Age of Onset  . Cancer Mother   . Cancer Sister   . Liver disease Brother     Social History Social History   Tobacco Use  . Smoking status: Former Smoker    Packs/day: 2.00    Years: 15.00    Pack years: 30.00    Types: Cigarettes    Last attempt to quit: 08/16/1996    Years since quitting: 21.5  . Smokeless tobacco: Never Used  Substance Use Topics  . Alcohol use: No  . Drug use: No     Allergies   Benadryl [diphenhydramine hcl]; Iodinated diagnostic agents; and Morphine and related   Review of Systems Review of Systems  Constitutional: Positive for activity change and appetite change. Negative for diaphoresis and fever.  HENT: Negative for congestion.   Respiratory: Positive for shortness of breath.   Cardiovascular: Positive for chest pain.  Gastrointestinal: Positive for abdominal pain, diarrhea, nausea and vomiting.  Genitourinary: Negative for dysuria.  Skin: Negative for rash.  All other systems reviewed and are negative.    Physical Exam Updated Vital Signs BP 116/74   Pulse 93   Temp 97.7 F (36.5 C) (Oral)   Resp 16   Ht 6\' 2"  (1.88 m)   Wt 93.4 kg (206 lb)   SpO2 92%   BMI 26.45 kg/m   Physical Exam  Constitutional: He is oriented to person, place, and time. He appears well-developed and well-nourished. He appears distressed.  HENT:  Head: Normocephalic and atraumatic.  Eyes: EOM are normal.  Neck: Normal range of motion.  Cardiovascular: Normal rate and regular rhythm.  No murmur heard. Pulmonary/Chest: Effort normal and breath sounds normal.  Abdominal:  Soft. Bowel sounds are normal. There is tenderness.  Musculoskeletal: Normal range of motion.       Right lower leg: Normal. He exhibits no edema.       Left lower leg: Normal. He exhibits no edema.  Neurological: He is alert and oriented to person, place, and time.  Skin: Skin is warm and dry. Capillary refill takes less than 2 seconds.     ED Treatments / Results  Labs (all labs ordered are listed, but only abnormal results are displayed) Labs Reviewed  BASIC METABOLIC PANEL - Abnormal; Notable for the following components:      Result Value   Chloride 97 (*)    Glucose, Bld 172 (*)    All other components within normal limits  CBC - Abnormal; Notable for the following components:   WBC 11.2 (*)    Hemoglobin 12.1 (*)    HCT 36.6 (*)  All other components within normal limits  CBG MONITORING, ED - Abnormal; Notable for the following components:   Glucose-Capillary 173 (*)    All other components within normal limits  I-STAT TROPONIN, ED  I-STAT TROPONIN, ED    EKG EKG Interpretation  Date/Time:  Tuesday March 13 2018 10:45:24 EDT Ventricular Rate:  80 PR Interval:    QRS Duration: 146 QT Interval:  428 QTC Calculation: 494 R Axis:   11 Text Interpretation:  Sinus rhythm Borderline prolonged PR interval Right bundle branch block ST-t wave abnormality Abnormal ekg Confirmed by Carmin Muskrat [phone]) on 03/13/2018 11:41:16 AM   Radiology Dg Chest 2 View  Result Date: 03/13/2018 CLINICAL DATA:  Chest pain. EXAM: CHEST - 2 VIEW COMPARISON:  Radiograph of February 16, 2018. FINDINGS: Stable cardiomegaly. Status post coronary artery bypass graft. No pneumothorax is noted. Hypoinflation of the lungs is noted with mild bibasilar subsegmental atelectasis. No significant pleural effusion is noted. Bony thorax is unremarkable. IMPRESSION: Hypoinflation of the lungs with mild bibasilar subsegmental atelectasis. Electronically Signed   By: Marijo Conception, M.D.   On: 03/13/2018 12:05    Ct Angio Chest/abd/pel For Dissection W And/or Wo Contrast  Result Date: 03/13/2018 CLINICAL DATA:  Pain, nausea, vomiting. 1 month ago coronary bypass grafting. EXAM: CT ANGIOGRAPHY CHEST, ABDOMEN AND PELVIS TECHNIQUE: Multidetector CT imaging through the chest, abdomen and pelvis was performed using the standard protocol during bolus administration of intravenous contrast. Multiplanar reconstructed images and MIPs were obtained and reviewed to evaluate the vascular anatomy. CONTRAST:  71mL ISOVUE-370 IOPAMIDOL (ISOVUE-370) INJECTION 76% COMPARISON:  08/09/2012 FINDINGS: CTA CHEST FINDINGS Cardiovascular: Heart size normal. Trace pericardial effusion. Fair contrast opacification of pulmonary artery branches; the exam was not optimized for detection of pulmonary emboli. Patency of at least 3 coronary bypass grafts. 4 cm dilatation of the ascending thoracic aorta. Partially calcified atheromatous plaque in the aortic arch and descending thoracic segment with some eccentric nonocclusive mural thrombus in the mid descending segment. No dissection or stenosis. Classic 3 vessel brachiocephalic arterial origin anatomy without proximal stenosis. Mediastinum/Nodes: No hilar or mediastinal adenopathy. No mediastinal hematoma. Lungs/Pleura: No pleural effusion. Small subpleural blebs in both lung apices. 6 mm nodule in the posterior left upper lobe image 68/8, and a 5 mm nodule in the lingula image 78/8. no pneumothorax. Musculoskeletal: Changes of median sternotomy. Spondylitic changes in the visualized lower cervical spine. No fracture or worrisome bone lesion Review of the MIP images confirms the above findings. CTA ABDOMEN AND PELVIS FINDINGS VASCULAR Aorta: Moderate atheromatous plaque and nonocclusive mural thrombus in the abdominal aorta, ectatic distally up to 2.8 cm, without aneurysm, dissection, or stenosis. Celiac: Patent without evidence of aneurysm, dissection, vasculitis or significant stenosis. SMA:  Patent without evidence of aneurysm, dissection, vasculitis or significant stenosis. Renals: Single right renal artery with calcified ostial partially calcified ostial plaque resulting in short segment stenosis, and there is proximal bifurcation into anterior and posterior divisions both of which are patent. Duplicated left renal arteries, superior dominant with a partially calcified ostial short segment stenosis of possible hemodynamic significance, patent distally. IMA: Patent without evidence of aneurysm, dissection, vasculitis or significant stenosis. Inflow: Patent without evidence of aneurysm, dissection, vasculitis or significant stenosis. Veins: No obvious venous abnormality within the limitations of this arterial phase study. Review of the MIP images confirms the above findings. NON-VASCULAR Hepatobiliary: Somewhat nodular hepatic contour suggesting cirrhosis, without focal lesion or biliary ductal dilatation. Multiple partially calcified stones measuring up to 1.1 cm in the  lumen of the nondilated gallbladder. Pancreas: Unremarkable. No pancreatic ductal dilatation or surrounding inflammatory changes. Spleen: Normal in size without focal abnormality. Adrenals/Urinary Tract: Multiple calculi in the lower pole right renal collecting system, measured up to 1.2 cm. No hydronephrosis. No focal renal mass. Normal adrenals. Urinary bladder unremarkable. Stomach/Bowel: Stomach and small bowel are nondilated. Fecalization of contents in the distal ileum. Normal appendix. Colon is nondilated, unremarkable. Lymphatic: No abdominal or pelvic adenopathy. Reproductive: Prostate is unremarkable. Other: No ascites.  No free air. Musculoskeletal: Umbilical hernia containing only mesenteric fat. Spondylitic changes in the lower lumbar spine. No fracture or worrisome bone lesion. Review of the MIP images confirms the above findings. IMPRESSION: 1. No acute findings post CABG. 2. 4 cm ascending thoracic aortic aneurysm with  Aortic Atherosclerosis (ICD10-170.0). Recommend annual imaging followup by CTA or MRA. This recommendation follows 2010 ACCF/AHA/AATS/ACR/ASA/SCA/SCAI/SIR/STS/SVM Guidelines for the Diagnosis and Management of Patients with Thoracic Aortic Disease. Circulation. 2010; 121: Q119-E174 3. Left lung nodules up to 6 mm as above. Non-contrast chest CT at 3-6 months is recommended. If the nodules are stable at time of repeat CT, then future CT at 18-24 months (from today's scan) is considered optional for low-risk patients, but is recommended for high-risk patients. This recommendation follows the consensus statement: Guidelines for Management of Incidental Pulmonary Nodules Detected on CT Images: From the Fleischner Society 2017; Radiology 2017; 284:228-243. 4. Bilateral ostial renal artery stenosis. 5. Right nephrolithiasis without hydronephrosis. 6. Cholelithiasis without biliary ductal dilatation. 7. Umbilical hernia containing only mesenteric fat. Electronically Signed   By: Lucrezia Europe M.D.   On: 03/13/2018 13:05    Procedures Procedures (including critical care time)  Medications Ordered in ED Medications  fentaNYL (SUBLIMAZE) injection 100 mcg (100 mcg Intravenous Given 03/13/18 1132)  methylPREDNISolone sodium succinate (SOLU-MEDROL) 125 mg/2 mL injection 125 mg (125 mg Intravenous Given 03/13/18 1132)  ondansetron (ZOFRAN) injection 4 mg (4 mg Intravenous Given 03/13/18 1132)  iopamidol (ISOVUE-370) 76 % injection 75 mL (75 mLs Intravenous Contrast Given 03/13/18 1229)     Initial Impression / Assessment and Plan / ED Course  I have reviewed the triage vital signs and the nursing notes.  Pertinent labs & imaging results that were available during my care of the patient were reviewed by me and considered in my medical decision making (see chart for details).     Based on recent CABG, and diffuse pain concern for dissection.  Will image with CT chest abdomen and pelvis with angiography.  Patient's  listed allergy includes IV contrast dye.  His wife states that many years ago he received morphine and IV contrast at the same time and he had a delirious episode.  They were told to get these 2 allergies together going forward.  He did receive dye with his CABG last month with premedication.  Discussed case with radiologist on call, given medical necessity to image for dissection, will proceed with CTA 1 hour after Solu-Medrol dose.  CTA without dissection, patient's pain is completely resolved with 100 mcg of fentanyl.   Second troponin negative. Unclear etiology of pain, possibly biliary colic given stone vs small spontaneous lung bleb rupture. Will discharge home with return precautions for worsening or recurrent pain.   Final Clinical Impressions(s) / ED Diagnoses   Final diagnoses:  Atypical chest pain  Generalized abdominal pain    ED Discharge Orders    None       Sela Hilding, MD 03/13/18 1526    Carmin Muskrat, MD 03/14/18  0733  

## 2018-03-13 NOTE — ED Notes (Addendum)
Wrong chart

## 2018-03-15 DIAGNOSIS — E782 Mixed hyperlipidemia: Secondary | ICD-10-CM | POA: Diagnosis not present

## 2018-03-15 DIAGNOSIS — I5021 Acute systolic (congestive) heart failure: Secondary | ICD-10-CM | POA: Diagnosis not present

## 2018-03-15 DIAGNOSIS — E1165 Type 2 diabetes mellitus with hyperglycemia: Secondary | ICD-10-CM | POA: Diagnosis not present

## 2018-03-15 DIAGNOSIS — M1991 Primary osteoarthritis, unspecified site: Secondary | ICD-10-CM | POA: Diagnosis not present

## 2018-03-15 DIAGNOSIS — R5383 Other fatigue: Secondary | ICD-10-CM | POA: Diagnosis not present

## 2018-03-15 DIAGNOSIS — I2510 Atherosclerotic heart disease of native coronary artery without angina pectoris: Secondary | ICD-10-CM | POA: Diagnosis not present

## 2018-03-19 ENCOUNTER — Ambulatory Visit (INDEPENDENT_AMBULATORY_CARE_PROVIDER_SITE_OTHER): Payer: Self-pay | Admitting: Surgical

## 2018-03-19 ENCOUNTER — Ambulatory Visit
Admission: RE | Admit: 2018-03-19 | Discharge: 2018-03-19 | Disposition: A | Discharge status: Home / Self Care | Payer: Medicare HMO | Source: Ambulatory Visit | Attending: Cardiothoracic Surgery | Admitting: Cardiothoracic Surgery

## 2018-03-19 ENCOUNTER — Other Ambulatory Visit: Payer: Self-pay | Admitting: Cardiothoracic Surgery

## 2018-03-19 VITALS — BP 106/71 | HR 95 | Resp 20 | Ht 74.0 in | Wt 201.0 lb

## 2018-03-19 DIAGNOSIS — Z951 Presence of aortocoronary bypass graft: Secondary | ICD-10-CM

## 2018-03-19 DIAGNOSIS — R918 Other nonspecific abnormal finding of lung field: Secondary | ICD-10-CM | POA: Diagnosis not present

## 2018-03-19 DIAGNOSIS — I2510 Atherosclerotic heart disease of native coronary artery without angina pectoris: Secondary | ICD-10-CM

## 2018-03-19 NOTE — Progress Notes (Signed)
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  SURGICAL PROCEDURE:  Coronary artery bypass grafting x5 with the left internal mammary to the left anterior descending coronary artery, reverse saphenous vein graft to the diagonal coronary artery, reverse saphenous vein graft to the obtuse marginal  coronary artery, sequential reverse saphenous vein graft to the posterior descending and posterolateral branches of the right coronary artery with right greater saphenous endoscopic vein harvesting thigh and calf.  SURGEON:  Lanelle Bal, MD  ASSISTANT:  Jadene Pierini, PA-C    History of Present Illness:    The patient is a 67 year old male status post the above described procedure seen in the office on today's date and routine postsurgical follow-up.  Overall the patient is doing quite well.  He had one episode of back pain and vomiting that he went to the emergency room last week for evaluation.  A CT scan was done and no specific new findings were made.  Patient himself believes it may have been related to some type of food poisoning.  Is fully resolved and he has had no further symptoms.  He also denies chest pain or shortness of breath at this time.  Ambulation continues to improve.  He has had no fevers, chills or other significant constitutional symptoms.  He has had no difficulty with his  incisions.  He does report his blood sugars are elevated but improved over previous management.  Hemoglobin A1c in the hospital was 9.5 and he is getting it rechecked and trying to make alterations in his diet as well as continue his metformin.      Past Medical History:  Diagnosis Date  . Allergy   . Diabetes mellitus without complication (HCC)      Social History   Tobacco Use  Smoking Status Former Smoker  . Packs/day: 2.00  . Years: 15.00  . Pack years: 30.00  . Types: Cigarettes  . Last attempt to quit: 08/16/1996  . Years since quitting: 21.6  Smokeless Tobacco Never Used    Social History   Substance and Sexual Activity  Alcohol Use No     Allergies  Allergen Reactions  . Benadryl [Diphenhydramine Hcl] Swelling  . Iodinated Diagnostic Agents Other (See Comments)    Unknown  . Morphine And Related Other (See Comments)    Unknown    Current Outpatient Medications  Medication Sig Dispense Refill  . acetaminophen (TYLENOL) 325 MG tablet Take 2 tablets (650 mg total) by mouth every 6 (six) hours as needed for mild pain.    Marland Kitchen aspirin EC 325 MG EC tablet Take 1 tablet (325 mg total) by mouth daily. 30 tablet 0  . atorvastatin (LIPITOR) 80 MG tablet Take 1 tablet (80 mg total) by mouth daily at 6 PM. 30 tablet 1  . carvedilol (COREG) 6.25 MG tablet Take 1 tablet (6.25 mg total) by mouth  2 (two) times daily with a meal. 60 tablet 1  . furosemide (LASIX) 40 MG tablet Take 1 tablet (40 mg total) by mouth daily. 30 tablet 0  . lisinopril (PRINIVIL,ZESTRIL) 2.5 MG tablet Take 1 tablet (2.5 mg total) by mouth daily. 30 tablet 1  . metFORMIN (GLUCOPHAGE-XR) 500 MG 24 hr tablet Take 1,000 mg by mouth 2 (two) times daily.     Marland Kitchen oxyCODONE (OXY IR/ROXICODONE) 5 MG immediate release tablet Take 5 mg by mouth every 4 (four) hours as needed for severe pain.     No current facility-administered medications for this visit.        Physical Exam: Ht 6\' 2"  (1.88 m)   Wt 91.2 kg  (201 lb)   BMI 25.81 kg/m   General appearance: alert, cooperative and no distress Heart: regular rate and rhythm Lungs: clear to auscultation bilaterally Abdomen: soft, non-tender; bowel sounds normal; no masses,  no organomegaly Extremities: Benign exam Wound: Incisions healing well without evidence of infection.   Diagnostic Studies & Laboratory data:     Recent Radiology Findings:   Dg Chest 2 View  Result Date: 03/19/2018 CLINICAL DATA:  67 year old male with a history of CABG. No complaints EXAM: CHEST - 2 VIEW COMPARISON:  CT 03/13/2018, plain film 03/13/2018 FINDINGS: Cardiomediastinal silhouette within normal limits in size and contour. Surgical changes of median sternotomy and CABG. No pneumothorax. No pleural effusion. No evidence of interlobular septal thickening. Coarsened interstitial markings persist. No confluent airspace disease. Small lung nodules better characterized on prior CT chest No displaced fracture. IMPRESSION: Chronic lung changes without evidence of acute cardiopulmonary disease. Surgical changes of median sternotomy and CABG. Electronically Signed   By: Corrie Mckusick D.O.   On: 03/19/2018 12:26      Recent Lab Findings: Lab Results  Component Value Date   WBC 11.2 (H) 03/13/2018   HGB 12.1 (L) 03/13/2018   HCT 36.6 (L) 03/13/2018   PLT 176 03/13/2018   GLUCOSE 172 (H) 03/13/2018   ALT 26 02/07/2018   AST 27 02/07/2018   NA 135 03/13/2018   K 4.1 03/13/2018   CL 97 (L) 03/13/2018   CREATININE 1.00 03/13/2018   BUN 23 03/13/2018   CO2 24 03/13/2018   INR 1.54 02/08/2018   HGBA1C 9.5 (H) 02/07/2018      Assessment / Plan: The patient is making excellent recovery from his surgery.  Did have a lengthy discussion about nutrition and things that he can change in regard to advancing his diabetes control.  We discussed activity progression with ambulation as well as driving.  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   I do feel that it is a little early in his recovery for that type of work as the drives often goes as far as [place].  We will see him again in 2 months and can reassess for possibility of resuming this at that time.       John Giovanni, PA-C 03/19/2018 12:57 PM PAGER [phone]

## 2018-03-19 NOTE — Patient Instructions (Signed)
Discussed activity progression including driving. °

## 2018-03-22 DIAGNOSIS — Z6827 Body mass index (BMI) 27.0-27.9, adult: Secondary | ICD-10-CM | POA: Diagnosis not present

## 2018-03-22 DIAGNOSIS — I2510 Atherosclerotic heart disease of native coronary artery without angina pectoris: Secondary | ICD-10-CM | POA: Diagnosis not present

## 2018-03-22 DIAGNOSIS — E782 Mixed hyperlipidemia: Secondary | ICD-10-CM | POA: Diagnosis not present

## 2018-03-22 DIAGNOSIS — E1165 Type 2 diabetes mellitus with hyperglycemia: Secondary | ICD-10-CM | POA: Diagnosis not present

## 2018-03-22 DIAGNOSIS — I5021 Acute systolic (congestive) heart failure: Secondary | ICD-10-CM | POA: Diagnosis not present

## 2018-03-22 DIAGNOSIS — D649 Anemia, unspecified: Secondary | ICD-10-CM | POA: Diagnosis not present

## 2018-03-22 DIAGNOSIS — E1159 Type 2 diabetes mellitus with other circulatory complications: Secondary | ICD-10-CM | POA: Diagnosis not present

## 2018-04-19 ENCOUNTER — Encounter: Payer: Self-pay | Admitting: Cardiology

## 2018-04-19 ENCOUNTER — Ambulatory Visit: Payer: Medicare HMO | Admitting: Cardiology

## 2018-04-19 ENCOUNTER — Encounter: Payer: Self-pay | Admitting: *Deleted

## 2018-04-19 VITALS — BP 101/66 | HR 79 | Ht 74.0 in | Wt 203.4 lb

## 2018-04-19 DIAGNOSIS — I5022 Chronic systolic (congestive) heart failure: Secondary | ICD-10-CM

## 2018-04-19 DIAGNOSIS — I2510 Atherosclerotic heart disease of native coronary artery without angina pectoris: Secondary | ICD-10-CM | POA: Diagnosis not present

## 2018-04-19 NOTE — Patient Instructions (Signed)
Your physician recommends that you schedule a follow-up appointment in: [place] has recommended you make the following change in your medication:   DECREASE ASPIRIN 60 MG DAILY  Your physician has requested that you have an echocardiogram. Echocardiography is a painless test that uses sound waves to create images of your heart. It provides your doctor with information about the size and shape of your heart and how well your heart's chambers and valves are working. This procedure takes approximately one hour. There are no restrictions for this procedure.  Thank you for choosing [facility]!!

## 2018-04-19 NOTE — Progress Notes (Signed)
Clinical Summary Alexander Parks is a 67 y.o.male seen today for follow up of the following medical problems.    1.Chronic systolic HF/ICM/CAD - pcp note from 01/10/18 reports recent leg edema, SOB.  - CXR 01/04/18 with pulm edema - 12/2017 echo LVEF 35-40%, mild to mod MR  - 01/2018 cath as reported below, severe triple vessel disease. - 02/08/18 CABG x 5 (LIMA-LAD, SVG-diag, SVG-OM, SVg-PDA and PL sequential)   - no recent chest pain. No SOB/DOE - cardiac rehab too expensive. He has been walking on his own.  - compliant with meds     SH: tractor trailerdriver    Past Medical History:  Diagnosis Date  . Allergy   . Diabetes mellitus without complication (HCC)      Allergies  Allergen Reactions  . Benadryl [Diphenhydramine Hcl] Swelling  . Iodinated Diagnostic Agents Other (See Comments)    Unknown  . Morphine And Related Other (See Comments)    Unknown     Current Outpatient Medications  Medication Sig Dispense Refill  . acetaminophen (TYLENOL) 325 MG tablet Take 2 tablets (650 mg total) by mouth every 6 (six) hours as needed for mild pain.    Marland Kitchen aspirin EC 325 MG EC tablet Take 1 tablet (325 mg total) by mouth daily. 30 tablet 0  . atorvastatin (LIPITOR) 80 MG tablet Take 1 tablet (80 mg total) by mouth daily at 6 PM. 30 tablet 1  . carvedilol (COREG) 6.25 MG tablet Take 1 tablet (6.25 mg total) by mouth 2 (two) times daily with a meal. 60 tablet 1  . furosemide (LASIX) 40 MG tablet Take 1 tablet (40 mg total) by mouth daily. 30 tablet 0  . lisinopril (PRINIVIL,ZESTRIL) 2.5 MG tablet Take 1 tablet (2.5 mg total) by mouth daily. 30 tablet 1  . metFORMIN (GLUCOPHAGE-XR) 500 MG 24 hr tablet Take 1,000 mg by mouth 2 (two) times daily.     Marland Kitchen oxyCODONE (OXY IR/ROXICODONE) 5 MG immediate release tablet Take 5 mg by mouth every 4 (four) hours as needed for severe pain.     No current facility-administered medications for this visit.      Past Surgical  History:  Procedure Laterality Date  . BALLOON DILATION  08/21/2012   Procedure: BALLOON DILATION;  Surgeon: Marissa Nestle, MD;  Location: [facility];  Service: Urology;  Laterality: Right;  Balloon Dilation Right Ureter  . CORONARY ARTERY BYPASS GRAFT N/A 02/08/2018   Procedure: CORONARY ARTERY BYPASS GRAFT times five, LIMA-LAD, SVG-DIAG, SVG-OM, SVG-PD-PL(SEQ), using left internal mammary artery and Endoharvest of Right greater saphenous vein;  Surgeon: Grace Isaac, MD;  Location: [facility];  Service: Open Heart Surgery;  Laterality: N/A;  . CYSTOSCOPY W/ URETERAL STENT PLACEMENT  08/21/2012   Procedure: CYSTOSCOPY WITH RETROGRADE PYELOGRAM/URETERAL STENT PLACEMENT;  Surgeon: Marissa Nestle, MD;  Location: [facility];  Service: Urology;  Laterality: Right;  Cystoscopy with Right Retrograde Pyelogram, Right Ureteral Stent Placement  . HERNIA REPAIR    . HOLMIUM LASER APPLICATION  12/20/996   Procedure: HOLMIUM LASER APPLICATION;  Surgeon: Marissa Nestle, MD;  Location: [facility];  Service: Urology;  Laterality: Right;  Holmium Laser Application to Right Ureteral Calculus  . INTRAOPERATIVE TRANSESOPHAGEAL ECHOCARDIOGRAM N/A 02/08/2018   Procedure: INTRAOPERATIVE TRANSESOPHAGEAL ECHOCARDIOGRAM;  Surgeon: Grace Isaac, MD;  Location: [facility];  Service: Open Heart Surgery;  Laterality: N/A;  . RIGHT/LEFT HEART CATH AND CORONARY ANGIOGRAPHY N/A 02/07/2018   Procedure: RIGHT/LEFT HEART CATH AND CORONARY ANGIOGRAPHY;  Surgeon: Burnell Blanks, MD;  Location: [facility];  Service: Cardiovascular;  Laterality: N/A;  . STONE EXTRACTION WITH BASKET  08/21/2012   Procedure: STONE EXTRACTION WITH BASKET;  Surgeon: Marissa Nestle, MD;  Location: [facility];  Service: Urology;  Laterality: Right;  Right Ureteral Stone Basket Extraction     Allergies  Allergen Reactions  . Benadryl [Diphenhydramine Hcl] Swelling  . Iodinated Diagnostic Agents Other (See Comments)    Unknown  . Morphine And  Related Other (See Comments)    Unknown      Family History  Problem Relation Age of Onset  . Cancer Mother   . Cancer Sister   . Liver disease Brother      Social History Alexander Parks reports that he quit smoking about 21 years ago. His smoking use included cigarettes. He has a 30.00 pack-year smoking history. He has never used smokeless tobacco. Alexander Parks reports that he does not drink alcohol.   Review of Systems CONSTITUTIONAL: No weight loss, fever, chills, weakness or fatigue.  HEENT: Eyes: No visual loss, blurred vision, double vision or yellow sclerae.No hearing loss, sneezing, congestion, runny nose or sore throat.  SKIN: No rash or itching.  CARDIOVASCULAR: per hpi RESPIRATORY: No shortness of breath, cough or sputum.  GASTROINTESTINAL: No anorexia, nausea, vomiting or diarrhea. No abdominal pain or blood.  GENITOURINARY: No burning on urination, no polyuria NEUROLOGICAL: No headache, dizziness, syncope, paralysis, ataxia, numbness or tingling in the extremities. No change in bowel or bladder control.  MUSCULOSKELETAL: No muscle, back pain, joint pain or stiffness.  LYMPHATICS: No enlarged nodes. No history of splenectomy.  PSYCHIATRIC: No history of depression or anxiety.  ENDOCRINOLOGIC: No reports of sweating, cold or heat intolerance. No polyuria or polydipsia.  Marland Kitchen   Physical Examination Vitals:   04/19/18 0852  BP: 101/66  Pulse: 79  SpO2: 97%   Vitals:   04/19/18 0852  Weight: 203 lb 6.4 oz (92.3 kg)  Height: 6\' 2"  (1.88 m)    Gen: resting comfortably, no acute distress HEENT: no scleral icterus, pupils equal round and reactive, no palptable cervical adenopathy,  CV: RRR, no m/r/g, no jvd Resp: Clear to auscultation bilaterally GI: abdomen is soft, non-tender, non-distended, normal bowel sounds, no hepatosplenomegaly MSK: extremities are warm, no edema.  Skin: warm, no rash Neuro:  no focal deficits Psych: appropriate affect   Diagnostic  Studies 12/2017 echo Study Conclusions  - Left ventricle: The cavity size was normal. Wall thickness was normal. Systolic function was moderately reduced. The estimated ejection fraction was in the range of 35% to 40%. Diffuse hypokinesis. The study is not technically sufficient to allow evaluation of LV diastolic function. - Aortic valve: Mildly calcified annulus. Trileaflet; mildly thickened leaflets. There was mild regurgitation. Valve area (VTI): 2.49 cm^2. Valve area (Vmax): 2.43 cm^2. Valve area (Vmean): 2.09 cm^2. - Mitral valve: There was mild to moderate regurgitation. - Left atrium: The atrium was moderately dilated. - Right ventricle: The cavity size was mildly to moderately dilated. Systolic function was mildly reduced. - Right atrium: The atrium was mildly dilated. - Pulmonary arteries: Systolic pressure was mildly increased. PA peak pressure: 34 mm Hg (S). - Pericardium, extracardiac: There is a left pleural effusion.  6/2019cath  Prox RCA lesion is 99% stenosed.  Post Atrio lesion is 99% stenosed.  Ost RPDA lesion is 50% stenosed.  Mid RCA lesion is 20% stenosed.  Ost LM to Mid LM lesion is 90% stenosed.  Mid LM to [facility]  LM lesion is 60% stenosed.  Prox Cx to Mid Cx lesion is 99% stenosed.  Prox LAD lesion is 99% stenosed.  Ost 1st Diag lesion is 80% stenosed.  Ost LAD to Prox LAD lesion is 30% stenosed.  1. Severe triple vessel CAD  2. Severe ostial left main stenosis 3. Severe stenosis mid LAD and in the small to moderate caliber diagonal Shamyia Grandpre 4. Severe stenosis in the mid Circumflex leading into the large obtuse marginal Shriya Aker 5. Severe stenosis in the proximal segment of the large dominant RCA. Severe stenosis in the posterolateral Shamarion Coots.  6. LVEF 35-40% by echo  Post cath Recommendations: Will admit to telemetry unit and consult CT surgery for CABG. He is having no chest pain or dyspnea. Will start ASA and high intensity  statin. Continue beta blocker, Ace-inh. Will start IV heparin 6 hours post sheath pull.   01/2018 Carotid US 1-39% bilateral disease   01/2018 ABI Normal bilateral    Assessment and Plan  1.Chronic systolic HF/ICM - recent diagnosis, LVEF 35-40%. Cath with multivessel CAD, s/p CABG - no recent symptoms. Medical therapy limited by soft bp's, no change today - repeat echo in November to see if LVEF has improved with revascularlization and medical therapy   2. CAD - no symptoms - change ASA to 81mg  daily   Request pcp labs Echo in Nov 2019 F/u with me 4 months       Arnoldo Lenis, M.D.

## 2018-05-24 ENCOUNTER — Ambulatory Visit: Payer: Medicare HMO | Admitting: Cardiothoracic Surgery

## 2018-06-13 ENCOUNTER — Other Ambulatory Visit: Payer: Self-pay | Admitting: Cardiothoracic Surgery

## 2018-06-13 DIAGNOSIS — Z951 Presence of aortocoronary bypass graft: Secondary | ICD-10-CM

## 2018-06-14 ENCOUNTER — Ambulatory Visit: Payer: Medicare HMO | Admitting: Cardiothoracic Surgery

## 2018-06-14 ENCOUNTER — Ambulatory Visit
Admission: RE | Admit: 2018-06-14 | Discharge: 2018-06-14 | Disposition: A | Discharge status: Home / Self Care | Payer: Medicare HMO | Source: Ambulatory Visit | Attending: Cardiothoracic Surgery | Admitting: Cardiothoracic Surgery

## 2018-06-14 VITALS — BP 126/70 | HR 84 | Resp 20 | Ht 74.0 in | Wt 206.0 lb

## 2018-06-14 DIAGNOSIS — Z951 Presence of aortocoronary bypass graft: Secondary | ICD-10-CM

## 2018-06-14 DIAGNOSIS — I2510 Atherosclerotic heart disease of native coronary artery without angina pectoris: Secondary | ICD-10-CM

## 2018-06-14 DIAGNOSIS — R918 Other nonspecific abnormal finding of lung field: Secondary | ICD-10-CM | POA: Diagnosis not present

## 2018-06-14 NOTE — Progress Notes (Signed)
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 SURGICAL PROCEDURE:  Coronary artery bypass grafting x5 with the left internal mammary to the left anterior descending coronary artery, reverse saphenous vein graft to the diagonal coronary artery, reverse saphenous vein graft to the obtuse marginal  coronary artery, sequential reverse saphenous vein graft to the posterior descending and posterolateral branches of the right coronary artery with right greater saphenous endoscopic vein harvesting thigh and calf. SURGEON:  Lanelle Bal, MD  History of Present Illness:     Patient returns to the office in follow-up after coronary artery bypass grafting in June 2019 when he presented with acute congestive heart failure symptoms and was found to have left main disease and three-vessel disease.  He is progressed well following coronary artery bypass grafting.  He denies any chest pain or signs or symptoms of congestive heart failure.  In July he had an episode of nausea and vomiting, he ultimately attributed this to food poisoning secondary to carrots.  During that process he had a CT scan of the chest done in the emergency room, follow-up for multiple small pulmonary nodules was recommended but no action has been taken on this.     Past Medical History:  Diagnosis Date  . Allergy   . Diabetes mellitus without complication (HCC)      Social History     Tobacco Use  Smoking Status Former Smoker  . Packs/day: 2.00  . Years: 15.00  . Pack years: 30.00  . Types: Cigarettes  . Last attempt to quit: 08/16/1996  . Years since quitting: 21.8  Smokeless Tobacco Never Used    Social History   Substance and Sexual Activity  Alcohol Use No     Allergies  Allergen Reactions  . Benadryl [Diphenhydramine Hcl] Swelling  . Iodinated Diagnostic Agents Other (See Comments)    Unknown  . Morphine And Related Other (See Comments)    Unknown    Current Outpatient Medications  Medication Sig Dispense Refill  . acetaminophen (TYLENOL) 325 MG tablet Take 2 tablets (650 mg total) by mouth every 6 (six) hours as needed for mild pain.    Marland Kitchen aspirin EC 81 MG tablet Take 81 mg by mouth daily.    Marland Kitchen atorvastatin (LIPITOR) 80 MG tablet Take 1 tablet (80 mg total) by mouth daily at 6 PM. 30 tablet 1  . carvedilol (COREG) 6.25 MG tablet Take 1 tablet (6.25 mg total) by mouth 2 (two) times daily with a meal. 60 tablet 1  . furosemide (LASIX) 40 MG tablet Take 1 tablet (40 mg total) by mouth daily. 30 tablet 0  . lisinopril (PRINIVIL,ZESTRIL) 2.5 MG tablet Take 1 tablet (2.5 mg total) by mouth daily. 30 tablet 1  . metFORMIN (GLUCOPHAGE-XR) 500 MG 24 hr tablet Take 1,000 mg by mouth 2 (two) times daily.     Marland Kitchen oxyCODONE (OXY IR/ROXICODONE) 5 MG immediate release tablet Take 5 mg by  mouth every 4 (four) hours as needed for severe pain.     No current facility-administered medications for this visit.        Physical Exam: BP 126/70   Pulse 84   Resp 20   Ht 6\' 2"  (1.88 m)   Wt 206 lb (93.4 kg)   SpO2 95% Comment: RA  BMI 26.45 kg/m   General appearance: alert and cooperative Neurologic: intact Heart: regular rate and rhythm, S1, S2 normal, no murmur, click, rub or gallop Lungs: clear to auscultation bilaterally Abdomen: soft, non-tender; bowel sounds normal; no masses,  no organomegaly Extremities: extremities normal, atraumatic, no cyanosis or  edema and Homans sign is negative, no sign of DVT Wound: Sternum is stable and well-healed   Diagnostic Studies & Laboratory data:     Recent Radiology Findings:   Dg Chest 2 View  Result Date: 06/14/2018 CLINICAL DATA:  Status post coronary bypass grafting 4 months ago EXAM: CHEST - 2 VIEW COMPARISON:  03/19/2018 FINDINGS: Cardiac shadow is within normal limits. Postsurgical changes are again seen and stable. The lungs are well aerated bilaterally. Minimal scarring in the left base is seen. No acute infiltrate or effusion is noted. No bony abnormality is noted. IMPRESSION: No acute abnormality noted.  Mild scarring in the left base is seen. Electronically Signed   By: Inez Catalina M.D.   On: 06/14/2018 14:48    I have independently reviewed the above radiology studies  and reviewed the findings with the patient.  CLINICAL DATA:  Pain, nausea, vomiting. 1 month ago coronary bypass grafting.  EXAM: CT ANGIOGRAPHY CHEST, ABDOMEN AND PELVIS  TECHNIQUE: Multidetector CT imaging through the chest, abdomen and pelvis was performed using the standard protocol during bolus administration of intravenous contrast. Multiplanar reconstructed images and MIPs were obtained and reviewed to evaluate the vascular anatomy.  CONTRAST:  5mL ISOVUE-370 IOPAMIDOL (ISOVUE-370) INJECTION 76%  COMPARISON:  08/09/2012  FINDINGS: CTA CHEST FINDINGS  Cardiovascular: Heart size normal. Trace pericardial effusion. Fair contrast opacification of pulmonary artery branches; the exam was not optimized for detection of pulmonary emboli. Patency of at least 3 coronary bypass grafts. 4 cm dilatation of the ascending thoracic aorta. Partially calcified atheromatous plaque in the aortic arch and descending thoracic segment with some eccentric nonocclusive mural thrombus in the mid descending segment. No dissection or stenosis. Classic 3 vessel brachiocephalic arterial origin anatomy without proximal  stenosis.  Mediastinum/Nodes: No hilar or mediastinal adenopathy. No mediastinal hematoma.  Lungs/Pleura: No pleural effusion. Small subpleural blebs in both lung apices. 6 mm nodule in the posterior left upper lobe image 68/8, and a 5 mm nodule in the lingula image 78/8. no pneumothorax.  Musculoskeletal: Changes of median sternotomy. Spondylitic changes in the visualized lower cervical spine. No fracture or worrisome bone lesion  Review of the MIP images confirms the above findings.  CTA ABDOMEN AND PELVIS FINDINGS  VASCULAR  Aorta: Moderate atheromatous plaque and nonocclusive mural thrombus in the abdominal aorta, ectatic distally up to 2.8 cm, without aneurysm, dissection, or stenosis.  Celiac: Patent without evidence of aneurysm, dissection, vasculitis or significant stenosis.  SMA: Patent without evidence of aneurysm, dissection, vasculitis or significant stenosis.  Renals: Single right renal artery with calcified ostial partially calcified ostial plaque resulting in short segment stenosis, and there is proximal bifurcation into anterior and posterior divisions both of which are patent.  Duplicated left renal arteries, superior dominant with a partially calcified ostial short segment stenosis of possible hemodynamic significance, patent distally.  IMA: Patent  without evidence of aneurysm, dissection, vasculitis or significant stenosis.  Inflow: Patent without evidence of aneurysm, dissection, vasculitis or significant stenosis.  Veins: No obvious venous abnormality within the limitations of this arterial phase study.  Review of the MIP images confirms the above findings.  NON-VASCULAR  Hepatobiliary: Somewhat nodular hepatic contour suggesting cirrhosis, without focal lesion or biliary ductal dilatation. Multiple partially calcified stones measuring up to 1.1 cm in the lumen of the nondilated gallbladder.  Pancreas: Unremarkable. No  pancreatic ductal dilatation or surrounding inflammatory changes.  Spleen: Normal in size without focal abnormality.  Adrenals/Urinary Tract: Multiple calculi in the lower pole right renal collecting system, measured up to 1.2 cm. No hydronephrosis. No focal renal mass. Normal adrenals. Urinary bladder unremarkable.  Stomach/Bowel: Stomach and small bowel are nondilated. Fecalization of contents in the distal ileum. Normal appendix. Colon is nondilated, unremarkable.  Lymphatic: No abdominal or pelvic adenopathy.  Reproductive: Prostate is unremarkable.  Other: No ascites.  No free air.  Musculoskeletal: Umbilical hernia containing only mesenteric fat. Spondylitic changes in the lower lumbar spine. No fracture or worrisome bone lesion.  Review of the MIP images confirms the above findings.  IMPRESSION: 1. No acute findings post CABG. 2. 4 cm ascending thoracic aortic aneurysm with Aortic Atherosclerosis (ICD10-170.0). Recommend annual imaging followup by CTA or MRA. This recommendation follows 2010 ACCF/AHA/AATS/ACR/ASA/SCA/SCAI/SIR/STS/SVM Guidelines for the Diagnosis and Management of Patients with Thoracic Aortic Disease. Circulation. 2010; 121: B096-G836 3. Left lung nodules up to 6 mm as above. Non-contrast chest CT at 3-6 months is recommended. If the nodules are stable at time of repeat CT, then future CT at 18-24 months (from today's scan) is considered optional for low-risk patients, but is recommended for high-risk patients. This recommendation follows the consensus statement: Guidelines for Management of Incidental Pulmonary Nodules Detected on CT Images: From the Fleischner Society 2017; Radiology 2017; 284:228-243. 4. Bilateral ostial renal artery stenosis. 5. Right nephrolithiasis without hydronephrosis. 6. Cholelithiasis without biliary ductal dilatation. 7. Umbilical hernia containing only mesenteric fat.   Electronically Signed   By: Lucrezia Europe M.D.   On: 03/13/2018 13:05  Recent Lab Findings: Lab Results  Component Value Date   WBC 11.2 (H) 03/13/2018   HGB 12.1 (L) 03/13/2018   HCT 36.6 (L) 03/13/2018   PLT 176 03/13/2018   GLUCOSE 172 (H) 03/13/2018   ALT 26 02/07/2018   AST 27 02/07/2018   NA 135 03/13/2018   K 4.1 03/13/2018   CL 97 (L) 03/13/2018   CREATININE 1.00 03/13/2018   BUN 23 03/13/2018   CO2 24 03/13/2018   INR 1.54 02/08/2018   HGBA1C 9.5 (H) 02/07/2018      Assessment / Plan:   #1 stable after coronary artery bypass grafting in June 2019 without recurrent signs or symptoms of congestive heart failure #2 incidental finding on CT scan of chest done in the emergency room July 2019' " Left lung nodules up to 6 mm as above. Non-contrast chest CT at 3-6 months is recommended. If the nodules are stable at time of repeat CT, then future CT at 18-24 months (from today's scan) is considered optional for low-risk patients, but is recommended for high-risk patients. This recommendation follows the consensus statement: Guidelines for Management of Incidental Pulmonary Nodules Detected on CT Images: From the Fleischner Society 2017; Radiology 2017; 284:228-243."  Scans reviewed with the patient and we will plan on a follow-up CT noncontrasted of the chest in December 2019      Grace Isaac  MD      [address] [place] [address] Office [phone]   Beeper [phone]  06/14/2018 3:29 PM

## 2018-06-22 ENCOUNTER — Other Ambulatory Visit: Payer: Self-pay | Admitting: *Deleted

## 2018-06-22 DIAGNOSIS — R911 Solitary pulmonary nodule: Secondary | ICD-10-CM

## 2018-06-25 DIAGNOSIS — D649 Anemia, unspecified: Secondary | ICD-10-CM | POA: Diagnosis not present

## 2018-06-25 DIAGNOSIS — E782 Mixed hyperlipidemia: Secondary | ICD-10-CM | POA: Diagnosis not present

## 2018-06-25 DIAGNOSIS — E1159 Type 2 diabetes mellitus with other circulatory complications: Secondary | ICD-10-CM | POA: Diagnosis not present

## 2018-06-25 DIAGNOSIS — E1165 Type 2 diabetes mellitus with hyperglycemia: Secondary | ICD-10-CM | POA: Diagnosis not present

## 2018-06-27 ENCOUNTER — Other Ambulatory Visit: Payer: Self-pay

## 2018-06-27 ENCOUNTER — Ambulatory Visit (INDEPENDENT_AMBULATORY_CARE_PROVIDER_SITE_OTHER): Payer: Medicare HMO

## 2018-06-27 DIAGNOSIS — I5022 Chronic systolic (congestive) heart failure: Secondary | ICD-10-CM

## 2018-06-28 ENCOUNTER — Telehealth: Payer: Self-pay | Admitting: *Deleted

## 2018-06-28 ENCOUNTER — Other Ambulatory Visit: Payer: Self-pay | Admitting: Physician Assistant

## 2018-06-28 DIAGNOSIS — E1159 Type 2 diabetes mellitus with other circulatory complications: Secondary | ICD-10-CM | POA: Diagnosis not present

## 2018-06-28 DIAGNOSIS — D696 Thrombocytopenia, unspecified: Secondary | ICD-10-CM | POA: Diagnosis not present

## 2018-06-28 DIAGNOSIS — E1165 Type 2 diabetes mellitus with hyperglycemia: Secondary | ICD-10-CM | POA: Diagnosis not present

## 2018-06-28 DIAGNOSIS — E782 Mixed hyperlipidemia: Secondary | ICD-10-CM | POA: Diagnosis not present

## 2018-06-28 DIAGNOSIS — I5021 Acute systolic (congestive) heart failure: Secondary | ICD-10-CM | POA: Diagnosis not present

## 2018-06-28 DIAGNOSIS — Z23 Encounter for immunization: Secondary | ICD-10-CM | POA: Diagnosis not present

## 2018-06-28 DIAGNOSIS — Z6828 Body mass index (BMI) 28.0-28.9, adult: Secondary | ICD-10-CM | POA: Diagnosis not present

## 2018-06-28 DIAGNOSIS — I2510 Atherosclerotic heart disease of native coronary artery without angina pectoris: Secondary | ICD-10-CM | POA: Diagnosis not present

## 2018-06-28 NOTE — Telephone Encounter (Signed)
Patient informed. 

## 2018-06-28 NOTE — Telephone Encounter (Signed)
-----   Message from Arnoldo Lenis, MD sent at 06/27/2018  3:46 PM EST ----- Echo looks good, heart function has normalized   Zandra Abts MD

## 2018-08-02 ENCOUNTER — Ambulatory Visit: Payer: Medicare HMO | Admitting: Cardiothoracic Surgery

## 2018-08-02 ENCOUNTER — Other Ambulatory Visit: Payer: Self-pay

## 2018-08-02 ENCOUNTER — Ambulatory Visit
Admission: RE | Admit: 2018-08-02 | Discharge: 2018-08-02 | Disposition: A | Discharge status: Home / Self Care | Payer: Medicare HMO | Source: Ambulatory Visit | Attending: Cardiothoracic Surgery | Admitting: Cardiothoracic Surgery

## 2018-08-02 ENCOUNTER — Encounter: Payer: Self-pay | Admitting: Cardiothoracic Surgery

## 2018-08-02 VITALS — BP 128/78 | HR 91 | Resp 18 | Ht 74.0 in | Wt 212.2 lb

## 2018-08-02 DIAGNOSIS — R918 Other nonspecific abnormal finding of lung field: Secondary | ICD-10-CM

## 2018-08-02 DIAGNOSIS — Z951 Presence of aortocoronary bypass graft: Secondary | ICD-10-CM | POA: Diagnosis not present

## 2018-08-02 DIAGNOSIS — R911 Solitary pulmonary nodule: Secondary | ICD-10-CM

## 2018-08-02 NOTE — Progress Notes (Signed)
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 SURGICAL PROCEDURE:  Coronary artery bypass grafting x5 with the left internal mammary to the left anterior descending coronary artery, reverse saphenous vein graft to the diagonal coronary artery, reverse saphenous vein graft to the obtuse marginal  coronary artery, sequential reverse saphenous vein graft to the posterior descending and posterolateral branches of the right coronary artery with right greater saphenous endoscopic vein harvesting thigh and calf. SURGEON:  Lanelle Bal, MD  History of Present Illness:    Patient returns to the office today in follow-up after coronary artery bypass grafting in June 2019, he is here today primarily for a follow-up CT scan of the chest.  CT of the chest had been done in the emergency room at the time he was evaluated for N&V  And thought to be food borne illness  .  Because based on the scan had been recommended that he have follow-up this was done today with follow-up CT of the chest.   He has been doing well from a cardiac standpoint since his surgery, he denies any chest pain or evidence of congestive heart failure.     Past Medical History:  Diagnosis Date  . Allergy   . Diabetes mellitus without complication (HCC)      Social History   Tobacco Use  Smoking Status Former Smoker  . Packs/day: 2.00  . Years: 15.00  . Pack years: 30.00  .  Types: Cigarettes  . Last attempt to quit: 08/16/1996  . Years since quitting: 21.9  Smokeless Tobacco Never Used    Social History   Substance and Sexual Activity  Alcohol Use No     Allergies  Allergen Reactions  . Benadryl [Diphenhydramine Hcl] Swelling  . Iodinated Diagnostic Agents Other (See Comments)    Unknown  . Morphine And Related Other (See Comments)    Unknown    Current Outpatient Medications  Medication Sig Dispense Refill  . acetaminophen (TYLENOL) 325 MG tablet Take 2 tablets (650 mg total) by mouth every 6 (six) hours as needed for mild pain.    Marland Kitchen aspirin EC 81 MG tablet Take 81 mg by mouth daily.    Marland Kitchen atorvastatin (LIPITOR) 80 MG tablet Take 1 tablet (80 mg total) by mouth daily at 6 PM. 30 tablet 1  . carvedilol (COREG) 6.25 MG tablet Take 1 tablet (6.25 mg total) by mouth 2 (two) times daily with a meal. 60 tablet 1  . furosemide (LASIX) 40 MG tablet Take 1 tablet (40 mg total) by mouth daily. 30 tablet 0  . lisinopril (PRINIVIL,ZESTRIL) 2.5 MG tablet Take 1 tablet (2.5 mg total) by mouth daily. 30 tablet 1  . metFORMIN (GLUCOPHAGE-XR) 500 MG 24 hr tablet Take 1,000 mg by mouth 2 (two) times daily.     Marland Kitchen oxyCODONE (OXY IR/ROXICODONE) 5 MG immediate release tablet Take 5 mg by mouth every 4 (four) hours as needed for  severe pain.     No current facility-administered medications for this visit.        Physical Exam: BP 128/78 (BP Location: Right Arm, Patient Position: Sitting, Cuff Size: Normal)   Pulse 91   Resp 18   Ht 6\' 2"  (1.88 m)   Wt 212 lb 3.2 oz (96.3 kg)   SpO2 92% Comment: RA  BMI 27.24 kg/m  General appearance: alert, cooperative and no distress Head: Normocephalic, without obvious abnormality, atraumatic Neck: no adenopathy, no carotid bruit, no JVD, supple, symmetrical, trachea midline and thyroid not enlarged, symmetric, no tenderness/mass/nodules Lymph nodes: Cervical, supraclavicular, and axillary nodes normal. Resp: clear to  auscultation bilaterally Back: symmetric, no curvature. ROM normal. No CVA tenderness. Cardio: regular rate and rhythm, S1, S2 normal, no murmur, click, rub or gallop GI: soft, non-tender; bowel sounds normal; no masses,  no organomegaly Extremities: extremities normal, atraumatic, no cyanosis or edema and Homans sign is negative, no sign of DVT Neurologic: Grossly normal Patient sternum is stable and well-healed   Diagnostic Studies & Laboratory data:     Recent Radiology Findings:   Ct Chest Wo Contrast  Result Date: 08/02/2018 CLINICAL DATA:  Follow-up pulmonary nodules.  Former smoker. EXAM: CT CHEST WITHOUT CONTRAST TECHNIQUE: Multidetector CT imaging of the chest was performed following the standard protocol without IV contrast. COMPARISON:  03/13/2018 chest CT angiogram. 06/14/2018 chest radiograph. FINDINGS: Cardiovascular: Normal heart size. No significant pericardial effusion/thickening. Left main and 3 vessel coronary atherosclerosis status post CABG. Atherosclerotic thoracic aorta with stable ectatic 4.0 cm ascending thoracic aorta. Normal caliber pulmonary arteries. Mediastinum/Nodes: No discrete thyroid nodules. Unremarkable esophagus. No pathologically enlarged axillary, mediastinal or hilar lymph nodes, noting limited sensitivity for the detection of hilar adenopathy on this noncontrast study. Lungs/Pleura: No pneumothorax. No pleural effusion. Mild centrilobular and paraseptal emphysema. No acute consolidative airspace disease or lung masses. Several small solid pulmonary nodules scattered throughout both lungs, largest 6 mm in the left upper lobe (series 8/image 59), all stable since 03/13/2018 chest CT angiogram study. No new significant pulmonary nodules. Upper abdomen: Liver surface is diffusely irregular and lateral segment left liver lobe is relatively hypertrophic, compatible with hepatic cirrhosis. Cholelithiasis. Musculoskeletal: No aggressive appearing focal osseous lesions.  Intact sternotomy wires. Moderate thoracic spondylosis. IMPRESSION: 1. Stable scattered small solid pulmonary nodules, largest 6 mm, for which 4 month stability has been demonstrated, more likely benign. Follow-up noncontrast chest CT in 14-20 months is considered optional for low-risk patients, but is recommended for high-risk patients. This recommendation follows the consensus statement: Guidelines for Management of Incidental Pulmonary Nodules Detected on CT Images: From the Fleischner Society 2017; Radiology 2017; 284:228-243. 2. Stable ectatic 4.0 cm ascending thoracic aorta. Recommend annual imaging followup by CTA or MRA. This recommendation follows 2010 ACCF/AHA/AATS/ACR/ASA/SCA/SCAI/SIR/STS/SVM Guidelines for the Diagnosis and Management of Patients with Thoracic Aortic Disease. Circulation. 2010; 121: Q034-V425. 3. Hepatic cirrhosis. 4. Cholelithiasis. Aortic Atherosclerosis (ICD10-I70.0) and Emphysema (ICD10-J43.9). Electronically Signed   By: Ilona Sorrel M.D.   On: 08/02/2018 11:14    I have independently reviewed the above radiology studies  and reviewed the findings with the patient.  CLINICAL DATA:  Pain, nausea, vomiting. 1 month ago coronary bypass grafting.  EXAM: CT ANGIOGRAPHY CHEST, ABDOMEN AND PELVIS  TECHNIQUE: Multidetector CT imaging through the chest, abdomen and pelvis was performed using the standard protocol during bolus administration of intravenous contrast. Multiplanar reconstructed images and MIPs were obtained and reviewed to evaluate the vascular anatomy.  CONTRAST:  59mL ISOVUE-370 IOPAMIDOL (ISOVUE-370)  INJECTION 76%  COMPARISON:  08/09/2012  FINDINGS: CTA CHEST FINDINGS  Cardiovascular: Heart size normal. Trace pericardial effusion. Fair contrast opacification of pulmonary artery branches; the exam was not optimized for detection of pulmonary emboli. Patency of at least 3 coronary bypass grafts. 4 cm dilatation of the ascending thoracic aorta.  Partially calcified atheromatous plaque in the aortic arch and descending thoracic segment with some eccentric nonocclusive mural thrombus in the mid descending segment. No dissection or stenosis. Classic 3 vessel brachiocephalic arterial origin anatomy without proximal stenosis.  Mediastinum/Nodes: No hilar or mediastinal adenopathy. No mediastinal hematoma.  Lungs/Pleura: No pleural effusion. Small subpleural blebs in both lung apices. 6 mm nodule in the posterior left upper lobe image 68/8, and a 5 mm nodule in the lingula image 78/8. no pneumothorax.  Musculoskeletal: Changes of median sternotomy. Spondylitic changes in the visualized lower cervical spine. No fracture or worrisome bone lesion  Review of the MIP images confirms the above findings.  CTA ABDOMEN AND PELVIS FINDINGS  VASCULAR  Aorta: Moderate atheromatous plaque and nonocclusive mural thrombus in the abdominal aorta, ectatic distally up to 2.8 cm, without aneurysm, dissection, or stenosis.  Celiac: Patent without evidence of aneurysm, dissection, vasculitis or significant stenosis.  SMA: Patent without evidence of aneurysm, dissection, vasculitis or significant stenosis.  Renals: Single right renal artery with calcified ostial partially calcified ostial plaque resulting in short segment stenosis, and there is proximal bifurcation into anterior and posterior divisions both of which are patent.  Duplicated left renal arteries, superior dominant with a partially calcified ostial short segment stenosis of possible hemodynamic significance, patent distally.  IMA: Patent without evidence of aneurysm, dissection, vasculitis or significant stenosis.  Inflow: Patent without evidence of aneurysm, dissection, vasculitis or significant stenosis.  Veins: No obvious venous abnormality within the limitations of this arterial phase study.  Review of the MIP images confirms the above  findings.  NON-VASCULAR  Hepatobiliary: Somewhat nodular hepatic contour suggesting cirrhosis, without focal lesion or biliary ductal dilatation. Multiple partially calcified stones measuring up to 1.1 cm in the lumen of the nondilated gallbladder.  Pancreas: Unremarkable. No pancreatic ductal dilatation or surrounding inflammatory changes.  Spleen: Normal in size without focal abnormality.  Adrenals/Urinary Tract: Multiple calculi in the lower pole right renal collecting system, measured up to 1.2 cm. No hydronephrosis. No focal renal mass. Normal adrenals. Urinary bladder unremarkable.  Stomach/Bowel: Stomach and small bowel are nondilated. Fecalization of contents in the distal ileum. Normal appendix. Colon is nondilated, unremarkable.  Lymphatic: No abdominal or pelvic adenopathy.  Reproductive: Prostate is unremarkable.  Other: No ascites.  No free air.  Musculoskeletal: Umbilical hernia containing only mesenteric fat. Spondylitic changes in the lower lumbar spine. No fracture or worrisome bone lesion.  Review of the MIP images confirms the above findings.  IMPRESSION: 1. No acute findings post CABG. 2. 4 cm ascending thoracic aortic aneurysm with Aortic Atherosclerosis (ICD10-170.0). Recommend annual imaging followup by CTA or MRA. This recommendation follows 2010 ACCF/AHA/AATS/ACR/ASA/SCA/SCAI/SIR/STS/SVM Guidelines for the Diagnosis and Management of Patients with Thoracic Aortic Disease. Circulation. 2010; 121: G269-S854 3. Left lung nodules up to 6 mm as above. Non-contrast chest CT at 3-6 months is recommended. If the nodules are stable at time of repeat CT, then future CT at 18-24 months (from today's scan) is considered optional for low-risk patients, but is recommended for high-risk patients. This recommendation follows the consensus statement: Guidelines for Management of Incidental Pulmonary Nodules Detected on CT Images: From the Fleischner  Society 2017; Radiology 2017; 284:228-243.  4. Bilateral ostial renal artery stenosis. 5. Right nephrolithiasis without hydronephrosis. 6. Cholelithiasis without biliary ductal dilatation. 7. Umbilical hernia containing only mesenteric fat.   Electronically Signed   By: Lucrezia Europe M.D.   On: 03/13/2018 13:05  Recent Lab Findings: Lab Results  Component Value Date   WBC 11.2 (H) 03/13/2018   HGB 12.1 (L) 03/13/2018   HCT 36.6 (L) 03/13/2018   PLT 176 03/13/2018   GLUCOSE 172 (H) 03/13/2018   ALT 26 02/07/2018   AST 27 02/07/2018   NA 135 03/13/2018   K 4.1 03/13/2018   CL 97 (L) 03/13/2018   CREATININE 1.00 03/13/2018   BUN 23 03/13/2018   CO2 24 03/13/2018   INR 1.54 02/08/2018   HGBA1C 9.5 (H) 02/07/2018      Assessment / Plan:   #1 stable after coronary artery bypass grafting in June 2019 without recurrent signs or symptoms of congestive heart failure #2 incidental finding on CT scan of chest done in the emergency room July 2019' " Left lung nodules up to 6 mm as above. Non-contrast chest CT at 3-6 months is recommended. If the nodules are stable at time of repeat CT, then future CT at 18-24 months (from today's scan) is considered optional for low-risk patients, but is recommended for high-risk patients. This recommendation follows the consensus statement: Guidelines for Management of Incidental Pulmonary Nodules Detected on CT Images: From the Fleischner Society 2017; Radiology 2017; 284:228-243."  Follow-up CT scan done today shows no change in multiple small pulmonary nodules-the patient has been a smoker in the past but quit 18 years ago.  We will have him return with a follow-up CT scan in 1 year    Grace Isaac MD      [place] ,[place] [address] Office [phone]   Beeper [phone]  08/02/2018 12:42 PM

## 2018-10-18 DIAGNOSIS — R5383 Other fatigue: Secondary | ICD-10-CM | POA: Diagnosis not present

## 2018-10-18 DIAGNOSIS — E1159 Type 2 diabetes mellitus with other circulatory complications: Secondary | ICD-10-CM | POA: Diagnosis not present

## 2018-10-18 DIAGNOSIS — E782 Mixed hyperlipidemia: Secondary | ICD-10-CM | POA: Diagnosis not present

## 2018-10-18 DIAGNOSIS — D649 Anemia, unspecified: Secondary | ICD-10-CM | POA: Diagnosis not present

## 2018-10-18 DIAGNOSIS — E1165 Type 2 diabetes mellitus with hyperglycemia: Secondary | ICD-10-CM | POA: Diagnosis not present

## 2018-10-25 ENCOUNTER — Other Ambulatory Visit: Payer: Self-pay | Admitting: Cardiology

## 2018-10-26 DIAGNOSIS — I5021 Acute systolic (congestive) heart failure: Secondary | ICD-10-CM | POA: Diagnosis not present

## 2018-10-26 DIAGNOSIS — I2510 Atherosclerotic heart disease of native coronary artery without angina pectoris: Secondary | ICD-10-CM | POA: Diagnosis not present

## 2018-10-26 DIAGNOSIS — D649 Anemia, unspecified: Secondary | ICD-10-CM | POA: Diagnosis not present

## 2018-10-26 DIAGNOSIS — E1165 Type 2 diabetes mellitus with hyperglycemia: Secondary | ICD-10-CM | POA: Diagnosis not present

## 2018-10-26 DIAGNOSIS — E782 Mixed hyperlipidemia: Secondary | ICD-10-CM | POA: Diagnosis not present

## 2018-10-26 DIAGNOSIS — D696 Thrombocytopenia, unspecified: Secondary | ICD-10-CM | POA: Diagnosis not present

## 2018-10-26 DIAGNOSIS — E1159 Type 2 diabetes mellitus with other circulatory complications: Secondary | ICD-10-CM | POA: Diagnosis not present

## 2018-10-26 DIAGNOSIS — E1122 Type 2 diabetes mellitus with diabetic chronic kidney disease: Secondary | ICD-10-CM | POA: Diagnosis not present

## 2018-11-01 ENCOUNTER — Telehealth: Payer: Self-pay | Admitting: *Deleted

## 2018-11-01 NOTE — Telephone Encounter (Signed)
Pt contacted. History reviewed. No symptoms to suggest any unstable cardiac conditions. Based on discussion, with current pandemic situation, we will be postponing 11/02/18 appt and rs for 01/03/19. If symptoms change, patient has been instructed to contact out office.

## 2018-11-02 ENCOUNTER — Ambulatory Visit: Payer: Medicare HMO | Admitting: Cardiology

## 2019-01-01 ENCOUNTER — Telehealth: Payer: Self-pay | Admitting: Cardiology

## 2019-01-01 NOTE — Telephone Encounter (Signed)
Virtual Visit Pre-Appointment Phone Call  "(Name), I am calling you today to discuss your upcoming appointment. We are currently trying to limit exposure to the virus that causes COVID-19 by seeing patients at home rather than in the office."  1. "What is the BEST phone number to call the day of the visit?" - include this in appointment notes  2. Do you have or have access to (through a family member/friend) a smartphone with video capability that we can use for your visit?" a. If yes - list this number in appt notes as cell (if different from BEST phone #) and list the appointment type as a VIDEO visit in appointment notes b. If no - list the appointment type as a PHONE visit in appointment notes  3. Confirm consent - "In the setting of the current Covid19 crisis, you are scheduled for a (phone or video) visit with your provider on (date) at (time).  Just as we do with many in-office visits, in order for you to participate in this visit, we must obtain consent.  If you'd like, I can send this to your mychart (if signed up) or email for you to review.  Otherwise, I can obtain your verbal consent now.  All virtual visits are billed to your insurance company just like a normal visit would be.  By agreeing to a virtual visit, we'd like you to understand that the technology does not allow for your provider to perform an examination, and thus may limit your provider's ability to fully assess your condition. If your provider identifies any concerns that need to be evaluated in person, we will make arrangements to do so.  Finally, though the technology is pretty good, we cannot assure that it will always work on either your or our end, and in the setting of a video visit, we may have to convert it to a phone-only visit.  In either situation, we cannot ensure that we have a secure connection.  Are you willing to proceed?" STAFF: Did the patient verbally acknowledge consent to telehealth visit? Document  YES/NO here: YES from [name]  4. Advise patient to be prepared - "Two hours prior to your appointment, go ahead and check your blood pressure, pulse, oxygen saturation, and your weight (if you have the equipment to check those) and write them all down. When your visit starts, your provider will ask you for this information. If you have an Apple Watch or Kardia device, please plan to have heart rate information ready on the day of your appointment. Please have a pen and paper handy nearby the day of the visit as well."  5. Give patient instructions for MyChart download to smartphone OR Doximity/Doxy.me as below if video visit (depending on what platform provider is using)  6. Inform patient they will receive a phone call 15 minutes prior to their appointment time (may be from unknown caller ID) so they should be prepared to answer    TELEPHONE CALL NOTE  Alexander Parks has been deemed a candidate for a follow-up tele-health visit to limit community exposure during the Covid-19 pandemic. I spoke with the patient via phone to ensure availability of phone/video source, confirm preferred email & phone number, and discuss instructions and expectations.  I reminded Alexander Parks to be prepared with any vital sign and/or heart rhythm information that could potentially be obtained via home monitoring, at the time of his visit. I reminded Alexander Parks to expect a phone call  prior to his visit.  Alexander Parks 01/01/2019 2:22 PM   INSTRUCTIONS FOR DOWNLOADING THE MYCHART APP TO SMARTPHONE  - The patient must first make sure to have activated MyChart and know their login information - If Apple, go to [employer] and type in MyChart in the search bar and download the app. If Android, ask patient to go to [employer] and type in [place] in the search bar and download the app. The app is free but as with any other app downloads, their phone may require them to verify saved payment information  or Apple/Android password.  - The patient will need to then log into the app with their MyChart username and password, and select  as their healthcare provider to link the account. When it is time for your visit, go to the MyChart app, find appointments, and click Begin Video Visit. Be sure to Select Allow for your device to access the Microphone and Camera for your visit. You will then be connected, and your provider will be with you shortly.  **If they have any issues connecting, or need assistance please contact MyChart service desk [phone])**  **If using a computer, in order to ensure the best quality for their visit they will need to use either of the following Internet Browsers: [employer], or Google Chrome**  IF USING DOXIMITY or DOXY.ME - The patient will receive a link just prior to their visit by text.     FULL LENGTH CONSENT FOR TELE-HEALTH VISIT   I hereby voluntarily request, consent and authorize [facility] and its employed or contracted physicians, physician assistants, nurse practitioners or other licensed health care professionals (the Practitioner), to provide me with telemedicine health care services (the Services") as deemed necessary by the treating Practitioner. I acknowledge and consent to receive the Services by the Practitioner via telemedicine. I understand that the telemedicine visit will involve communicating with the Practitioner through live audiovisual communication technology and the disclosure of certain medical information by electronic transmission. I acknowledge that I have been given the opportunity to request an in-person assessment or other available alternative prior to the telemedicine visit and am voluntarily participating in the telemedicine visit.  I understand that I have the right to withhold or withdraw my consent to the use of telemedicine in the course of my care at any time, without affecting my right to future  care or treatment, and that the Practitioner or I may terminate the telemedicine visit at any time. I understand that I have the right to inspect all information obtained and/or recorded in the course of the telemedicine visit and may receive copies of available information for a reasonable fee.  I understand that some of the potential risks of receiving the Services via telemedicine include:   Delay or interruption in medical evaluation due to technological equipment failure or disruption;  Information transmitted may not be sufficient (e.g. poor resolution of images) to allow for appropriate medical decision making by the Practitioner; and/or   In rare instances, security protocols could fail, causing a breach of personal health information.  Furthermore, I acknowledge that it is my responsibility to provide information about my medical history, conditions and care that is complete and accurate to the best of my ability. I acknowledge that Practitioner's advice, recommendations, and/or decision may be based on factors not within their control, such as incomplete or inaccurate data provided by me or distortions of diagnostic images or specimens that may result from electronic  transmissions. I understand that the practice of medicine is not an exact science and that Practitioner makes no warranties or guarantees regarding treatment outcomes. I acknowledge that I will receive a copy of this consent concurrently upon execution via email to the email address I last provided but may also request a printed copy by calling the office of [facility].    I understand that my insurance will be billed for this visit.   I have read or had this consent read to me.  I understand the contents of this consent, which adequately explains the benefits and risks of the Services being provided via telemedicine.   I have been provided ample opportunity to ask questions regarding this consent and the Services and have  had my questions answered to my satisfaction.  I give my informed consent for the services to be provided through the use of telemedicine in my medical care  By participating in this telemedicine visit I agree to the above.

## 2019-01-03 ENCOUNTER — Encounter: Payer: Self-pay | Admitting: Cardiology

## 2019-01-03 ENCOUNTER — Encounter: Payer: Self-pay | Admitting: *Deleted

## 2019-01-03 ENCOUNTER — Telehealth (INDEPENDENT_AMBULATORY_CARE_PROVIDER_SITE_OTHER): Payer: Medicare HMO | Admitting: Cardiology

## 2019-01-03 VITALS — Ht 74.0 in | Wt 219.0 lb

## 2019-01-03 DIAGNOSIS — I255 Ischemic cardiomyopathy: Secondary | ICD-10-CM

## 2019-01-03 DIAGNOSIS — I2510 Atherosclerotic heart disease of native coronary artery without angina pectoris: Secondary | ICD-10-CM | POA: Diagnosis not present

## 2019-01-03 MED ORDER — CARVEDILOL 3.125 MG PO TABS: 3.1250 mg | ORAL_TABLET | Freq: Two times a day (BID) | ORAL | 1 refills | Status: DC

## 2019-01-03 NOTE — Patient Instructions (Signed)
Your physician wants you to follow-up in: [place] will receive a reminder letter in the mail two months in advance. If you don't receive a letter, please call our office to schedule the follow-up appointment.  Your physician has recommended you make the following change in your medication:   TAKE COREG 3.125 MG TWICE DAILY   Thank you for choosing [facility]!!

## 2019-01-03 NOTE — Progress Notes (Signed)
Virtual Visit via Telephone Note   This visit type was conducted due to national recommendations for restrictions regarding the COVID-19 Pandemic (e.g. social distancing) in an effort to limit this patient's exposure and mitigate transmission in our community.  Due to his co-morbid illnesses, this patient is at least at moderate risk for complications without adequate follow up.  This format is felt to be most appropriate for this patient at this time.  The patient did not have access to video technology/had technical difficulties with video requiring transitioning to audio format only (telephone).  All issues noted in this document were discussed and addressed.  No physical exam could be performed with this format.  Please refer to the patient's chart for his  consent to telehealth for [facility].   Date:  01/03/2019   ID:  Alexander Parks, DOB April 28, 1951, MRN [id number]  Patient Location: Home Provider Location: Office  PCP:  Manon Hilding, MD  Cardiologist:  Carlyle Dolly, MD  Electrophysiologist:  None   Evaluation Performed:  Follow-Up Visit  Chief Complaint:  6 month f/u  History of Present Illness:    Alexander Parks is a 68 y.o. male seen today for follow up of the following medical problems.    1.Chronic systolic HF/ICM/CAD - pcp note from 01/10/18 reports recent leg edema, SOB.  - CXR 01/04/18 with pulm edema  - 12/2017 echo LVEF 35-40%, mild to mod MR - 01/2018 cath as reported below, severe triple vessel disease. - 02/08/18 CABG x 5 (LIMA-LAD, SVG-diag, SVG-OM, SVg-PDA and PL sequential)   - 06/2018 echo: LVEF 60-65%, no WMAs - no recent chest pain. No SOB/DOE - compliant with meds  2. Lung nodules - followed by CT surgery     SH: tractor trailerdriver  The patient does not have symptoms concerning for COVID-19 infection (fever, chills, cough, or new shortness of breath).    Past Medical History:  Diagnosis Date  . Allergy   .  Diabetes mellitus without complication [facility])    Past Surgical History:  Procedure Laterality Date  . BALLOON DILATION  08/21/2012   Procedure: BALLOON DILATION;  Surgeon: Marissa Nestle, MD;  Location: [facility];  Service: Urology;  Laterality: Right;  Balloon Dilation Right Ureter  . CORONARY ARTERY BYPASS GRAFT N/A 02/08/2018   Procedure: CORONARY ARTERY BYPASS GRAFT times five, LIMA-LAD, SVG-DIAG, SVG-OM, SVG-PD-PL(SEQ), using left internal mammary artery and Endoharvest of Right greater saphenous vein;  Surgeon: Grace Isaac, MD;  Location: [place];  Service: Open Heart Surgery;  Laterality: N/A;  . CYSTOSCOPY W/ URETERAL STENT PLACEMENT  08/21/2012   Procedure: CYSTOSCOPY WITH RETROGRADE PYELOGRAM/URETERAL STENT PLACEMENT;  Surgeon: Marissa Nestle, MD;  Location: [facility];  Service: Urology;  Laterality: Right;  Cystoscopy with Right Retrograde Pyelogram, Right Ureteral Stent Placement  . HERNIA REPAIR    . HOLMIUM LASER APPLICATION  03/19/1659   Procedure: HOLMIUM LASER APPLICATION;  Surgeon: Marissa Nestle, MD;  Location: [facility];  Service: Urology;  Laterality: Right;  Holmium Laser Application to Right Ureteral Calculus  . INTRAOPERATIVE TRANSESOPHAGEAL ECHOCARDIOGRAM N/A 02/08/2018   Procedure: INTRAOPERATIVE TRANSESOPHAGEAL ECHOCARDIOGRAM;  Surgeon: Grace Isaac, MD;  Location: [facility];  Service: Open Heart Surgery;  Laterality: N/A;  . RIGHT/LEFT HEART CATH AND CORONARY ANGIOGRAPHY N/A 02/07/2018   Procedure: RIGHT/LEFT HEART CATH AND CORONARY ANGIOGRAPHY;  Surgeon: Burnell Blanks, MD;  Location: [place] [facility];  Service: Cardiovascular;  Laterality: N/A;  . STONE EXTRACTION WITH BASKET  08/21/2012  Procedure: STONE EXTRACTION WITH BASKET;  Surgeon: Marissa Nestle, MD;  Location: [facility];  Service: Urology;  Laterality: Right;  Right Ureteral Stone Basket Extraction     No outpatient medications have been marked as taking for the 01/03/19 encounter (Appointment) with  Arnoldo Lenis, MD.     Allergies:   Benadryl [diphenhydramine hcl]; Iodinated diagnostic agents; and Morphine and related   Social History   Tobacco Use  . Smoking status: Former Smoker    Packs/day: 2.00    Years: 15.00    Pack years: 30.00    Types: Cigarettes    Last attempt to quit: 08/16/1996    Years since quitting: 22.3  . Smokeless tobacco: Never Used  Substance Use Topics  . Alcohol use: No  . Drug use: No     Family Hx: The patient's family history includes Cancer in his mother and sister; Liver disease in his brother.  ROS:   Please see the history of present illness.     All other systems reviewed and are negative.   Prior CV studies:   The following studies were reviewed today: 12/2017 echo Study Conclusions  - Left ventricle: The cavity size was normal. Wall thickness was normal. Systolic function was moderately reduced. The estimated ejection fraction was in the range of 35% to 40%. Diffuse hypokinesis. The study is not technically sufficient to allow evaluation of LV diastolic function. - Aortic valve: Mildly calcified annulus. Trileaflet; mildly thickened leaflets. There was mild regurgitation. Valve area (VTI): 2.49 cm^2. Valve area (Vmax): 2.43 cm^2. Valve area (Vmean): 2.09 cm^2. - Mitral valve: There was mild to moderate regurgitation. - Left atrium: The atrium was moderately dilated. - Right ventricle: The cavity size was mildly to moderately dilated. Systolic function was mildly reduced. - Right atrium: The atrium was mildly dilated. - Pulmonary arteries: Systolic pressure was mildly increased. PA peak pressure: 34 mm Hg (S). - Pericardium, extracardiac: There is a left pleural effusion.  6/2019cath  Prox RCA lesion is 99% stenosed.  Post Atrio lesion is 99% stenosed.  Ost RPDA lesion is 50% stenosed.  Mid RCA lesion is 20% stenosed.  Ost LM to Mid LM lesion is 90% stenosed.  Mid LM to Dist LM lesion is 60%  stenosed.  Prox Cx to Mid Cx lesion is 99% stenosed.  Prox LAD lesion is 99% stenosed.  Ost 1st Diag lesion is 80% stenosed.  Ost LAD to Prox LAD lesion is 30% stenosed.  1. Severe triple vessel CAD  2. Severe ostial left main stenosis 3. Severe stenosis mid LAD and in the small to moderate caliber diagonal Bradley Bostelman 4. Severe stenosis in the mid Circumflex leading into the large obtuse marginal Zaccheaus Storlie 5. Severe stenosis in the proximal segment of the large dominant RCA. Severe stenosis in the posterolateral Akiya Morr.  6. LVEF 35-40% by echo  Post cathRecommendations: Will admit to telemetry unit and consult CT surgery for CABG. He is having no chest pain or dyspnea. Will start ASA and high intensity statin. Continue beta blocker, Ace-inh. Will start IV heparin 6 hours post sheath pull.   01/2018 Carotid US 1-39% bilateral disease   01/2018 ABI Normal bilateral  06/2018 echo Study Conclusions  - Left ventricle: The cavity size was normal. Wall thickness was   increased in a pattern of mild LVH. Systolic function was normal.   The estimated ejection fraction was in the range of 60% to 65%.   Wall motion was normal; there were no regional wall motion  abnormalities. Indeterminate diastolic function. - Aortic valve: Trileaflet; moderately calcified leaflets. - Mitral valve: Mildly calcified annulus. - Right atrium: Central venous pressure (est): 3 mm Hg. - Atrial septum: No defect or patent foramen ovale was identified. - Tricuspid valve: There was trivial regurgitation. - Pulmonary arteries: PA peak pressure: 11 mm Hg (S). - Pericardium, extracardiac: There was no pericardial effusion.  Impressions:  - LVEF has normalized in comparison to prior study from May.   Labs/Other Tests and Data Reviewed:    EKG:  No ECG reviewed.  Recent Labs: 02/07/2018: ALT 26 02/09/2018: Magnesium 2.0 03/13/2018: BUN 23; Creatinine, Ser 1.00; Hemoglobin 12.1; Platelets 176; Potassium  4.1; Sodium 135   Recent Lipid Panel No results found for: CHOL, TRIG, HDL, CHOLHDL, LDLCALC, LDLDIRECT  Wt Readings from Last 3 Encounters:  08/02/18 212 lb 3.2 oz (96.3 kg)  06/14/18 206 lb (93.4 kg)  04/19/18 203 lb 6.4 oz (92.3 kg)     Objective:    Vital Signs:   Today's Vitals   01/03/19 1417  Weight: 219 lb (99.3 kg)  Height: 6\' 2"  (1.88 m)   Body mass index is 28.12 kg/m.  Normal affect. Normal speech pattern and tone. Comfortable, no apparent distress. No audible signs of SOb or DOE.   ASSESSMENT & PLAN:    1.CAD/ICM - s/p CABG nearly 1 year ago. Fortunately follow up echo shows LVEF has normalized.  -  Medical therapy limited by soft bp's - change coreg to 3.125mg  bid, continue other meds - no current symptoms.   Request pcp labs  COVID-19 Education: The signs and symptoms of COVID-19 were discussed with the patient and how to seek care for testing (follow up with PCP or arrange E-visit).  The importance of social distancing was discussed today.  Time:   Today, I have spent 15 minutes with the patient with telehealth technology discussing the above problems.     Medication Adjustments/Labs and Tests Ordered: Current medicines are reviewed at length with the patient today.  Concerns regarding medicines are outlined above.   Tests Ordered: No orders of the defined types were placed in this encounter.   Medication Changes: No orders of the defined types were placed in this encounter.   Disposition:  Follow up 6 months  Signed, Carlyle Dolly, MD  01/03/2019 2:07 PM    [place]

## 2019-01-03 NOTE — Addendum Note (Signed)
Addended by: Julian Hy T on: 01/03/2019 03:03 PM   Modules accepted: Orders

## 2019-01-10 DIAGNOSIS — B029 Zoster without complications: Secondary | ICD-10-CM | POA: Diagnosis not present

## 2019-01-29 ENCOUNTER — Other Ambulatory Visit: Payer: Self-pay | Admitting: Cardiology

## 2019-05-05 IMAGING — CR DG CHEST 1V PORT
1 series · 1 of 1 positions shown · non-contrast
Comparison: 02/07/2018

CLINICAL DATA: S/P CABG X 5

EXAM:
PORTABLE CHEST 1 VIEW

[AP]
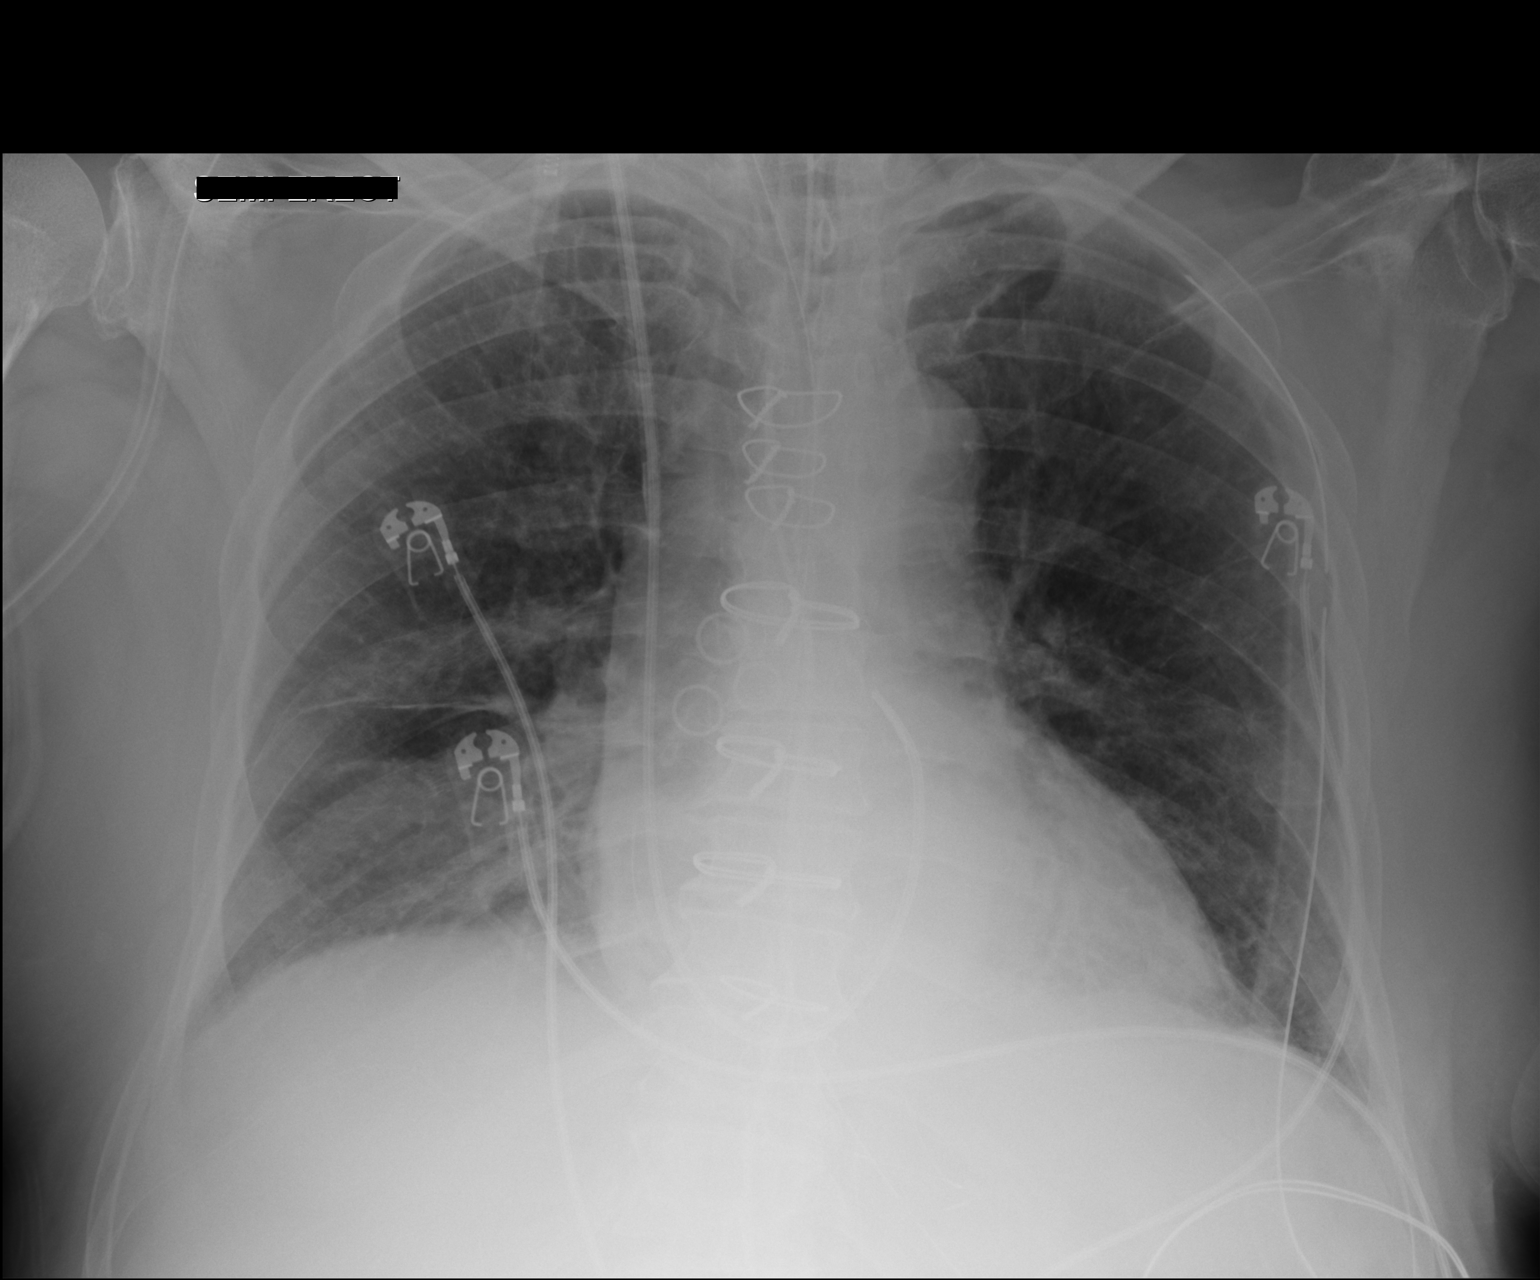

[1 of 1 positions shown; findings below may reference images not displayed]

FINDINGS: Interval median sternotomy and CABG. RIGHT IJ Swan-Ganz catheter tip
overlies the level of the pulmonary outflow tract. An endotracheal
tube has been placed, tip approximately 7.3 centimeters above the
carina. A nasogastric tube is in place, tip beyond the
gastroesophageal junction.

There is minimal bibasilar atelectasis. Heart size is normal.
LEFT-sided chest tube. No pneumothorax.
IMPRESSION: Postoperative appearance of the chest.

Minimal bibasilar atelectasis.

## 2019-05-08 IMAGING — DX DG CHEST 1V PORT
1 series · 1 of 1 positions shown · non-contrast
Comparison: Chest radiograph 02/10/2018

CLINICAL DATA: Patient with chest tube in place.

EXAM:
PORTABLE CHEST 1 VIEW

[chest ap]
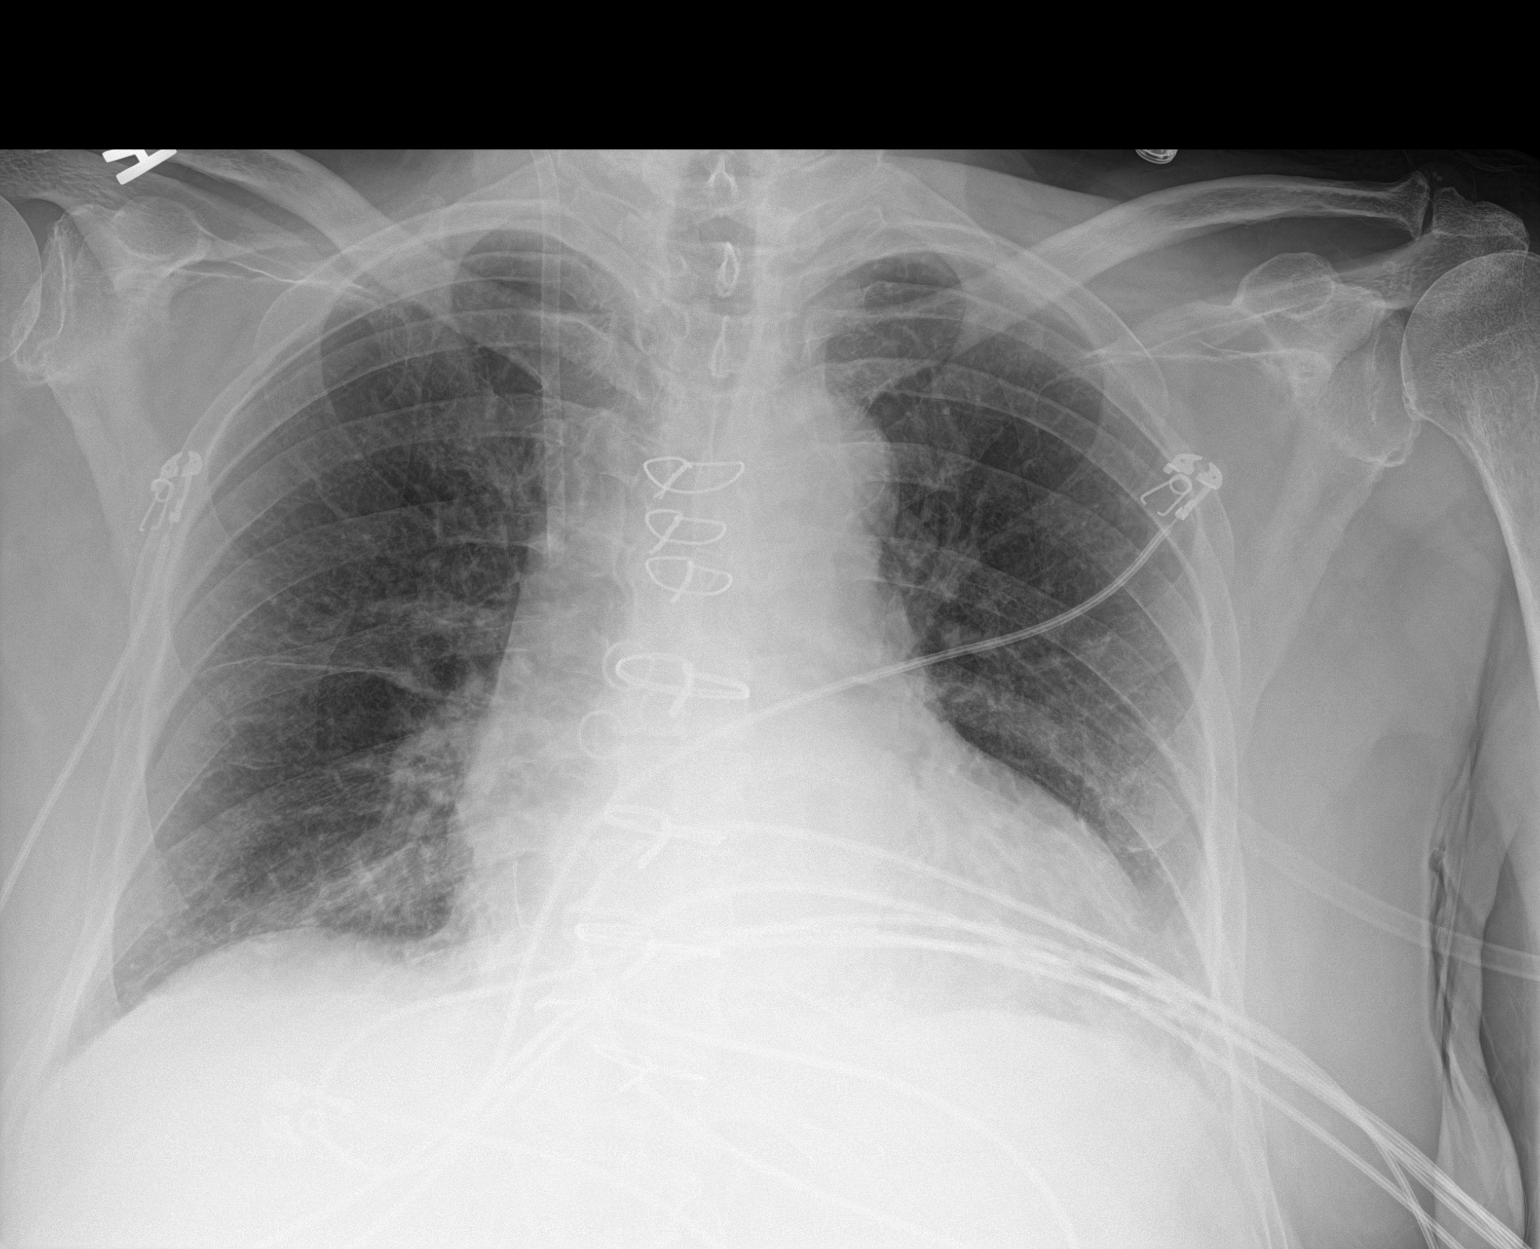

[1 of 1 positions shown; findings below may reference images not displayed]

FINDINGS: Right IJ sheath projects over the superior vena cava. Monitoring
leads project over the patient. Stable cardiomegaly status post
median sternotomy. Low lung volumes. Bibasilar atelectasis. Interval
removal left chest tube. No pleural effusion or pneumothorax.
IMPRESSION: Minimal left basilar atelectasis. No pneumothorax status post left
chest tube removal.

## 2019-05-09 IMAGING — CR DG CHEST 2V
2 series · 2 of 2 positions shown · non-contrast
Comparison: Yesterday

CLINICAL DATA: Postop check.  Bypass surgery

EXAM:
CHEST - 2 VIEW

[chest pa]
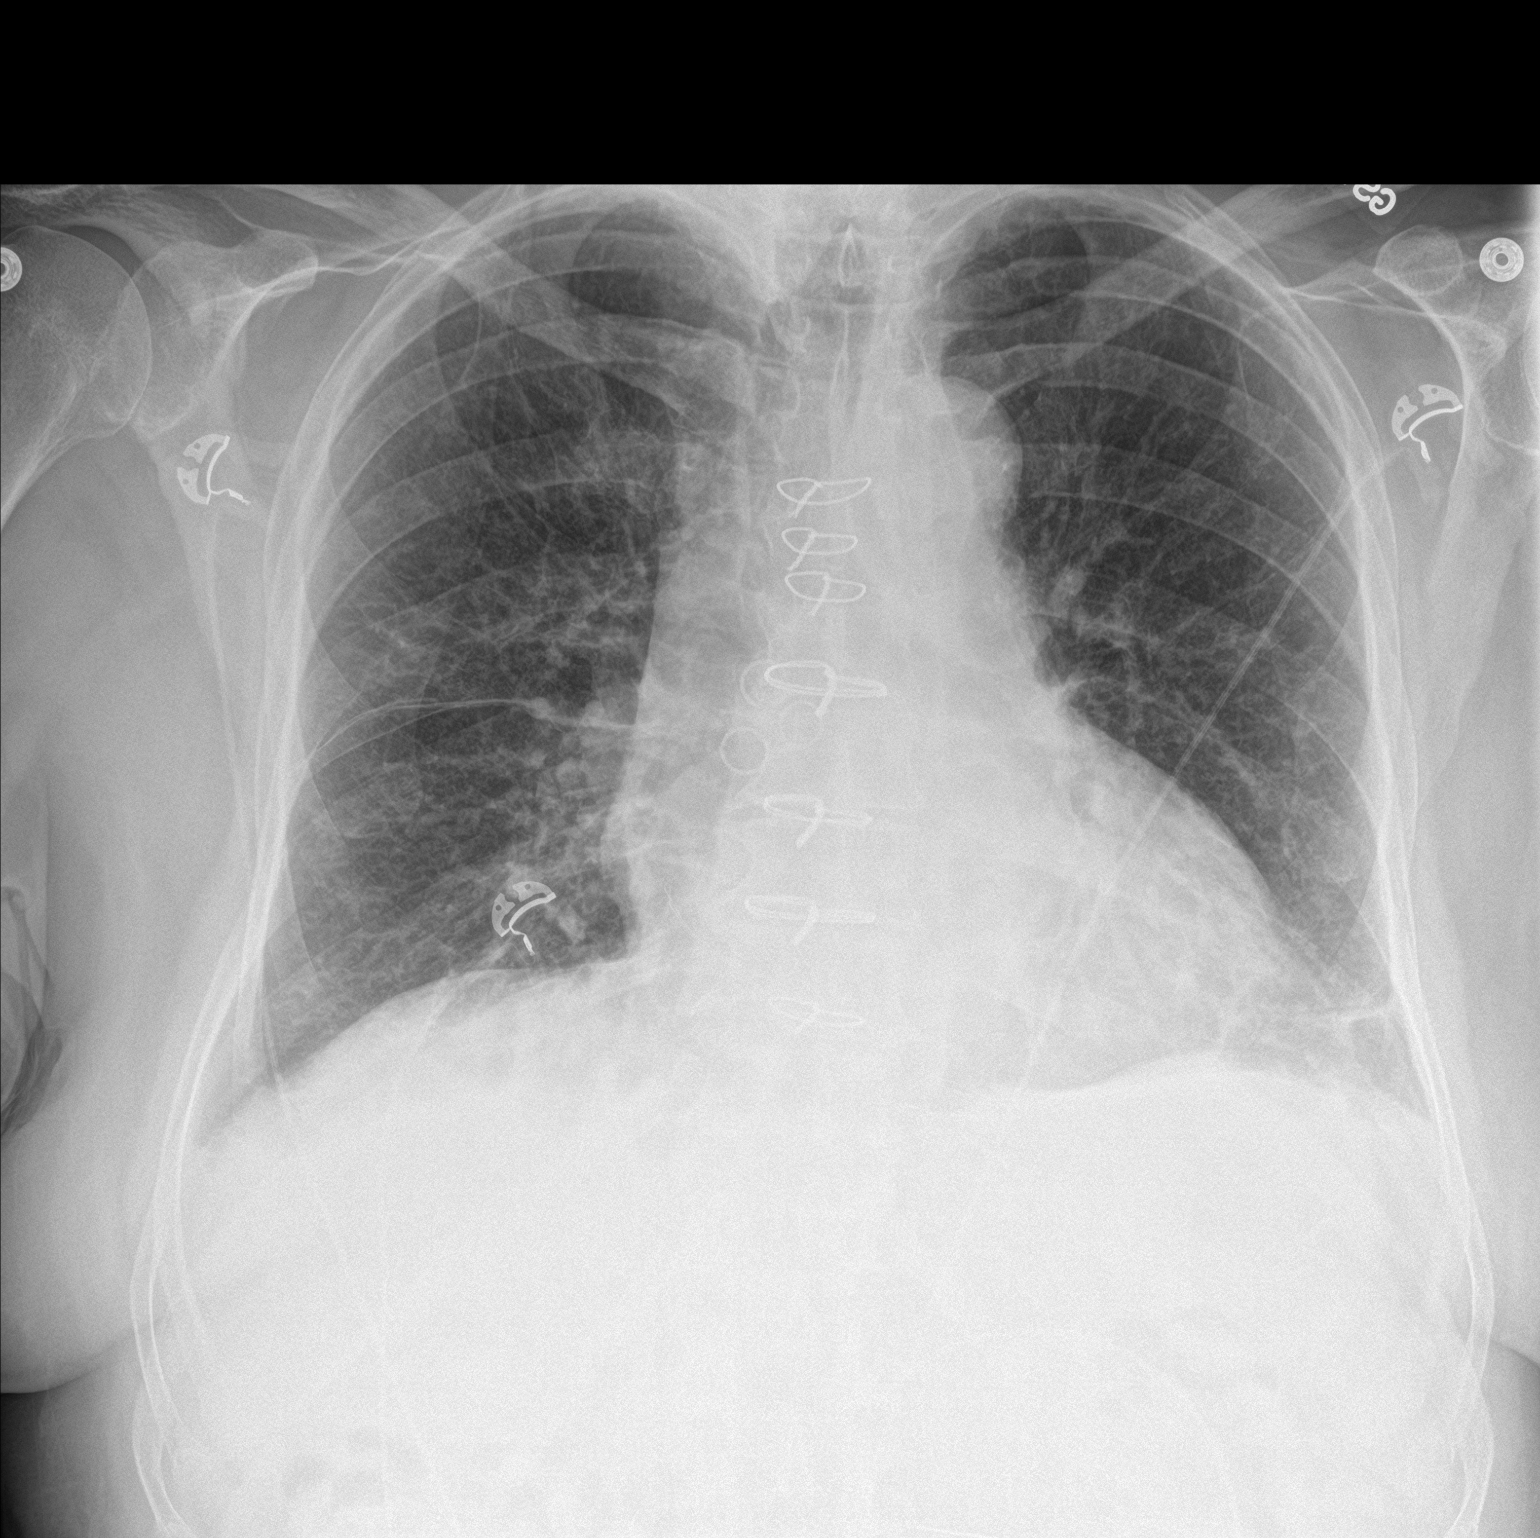

[chest lat]
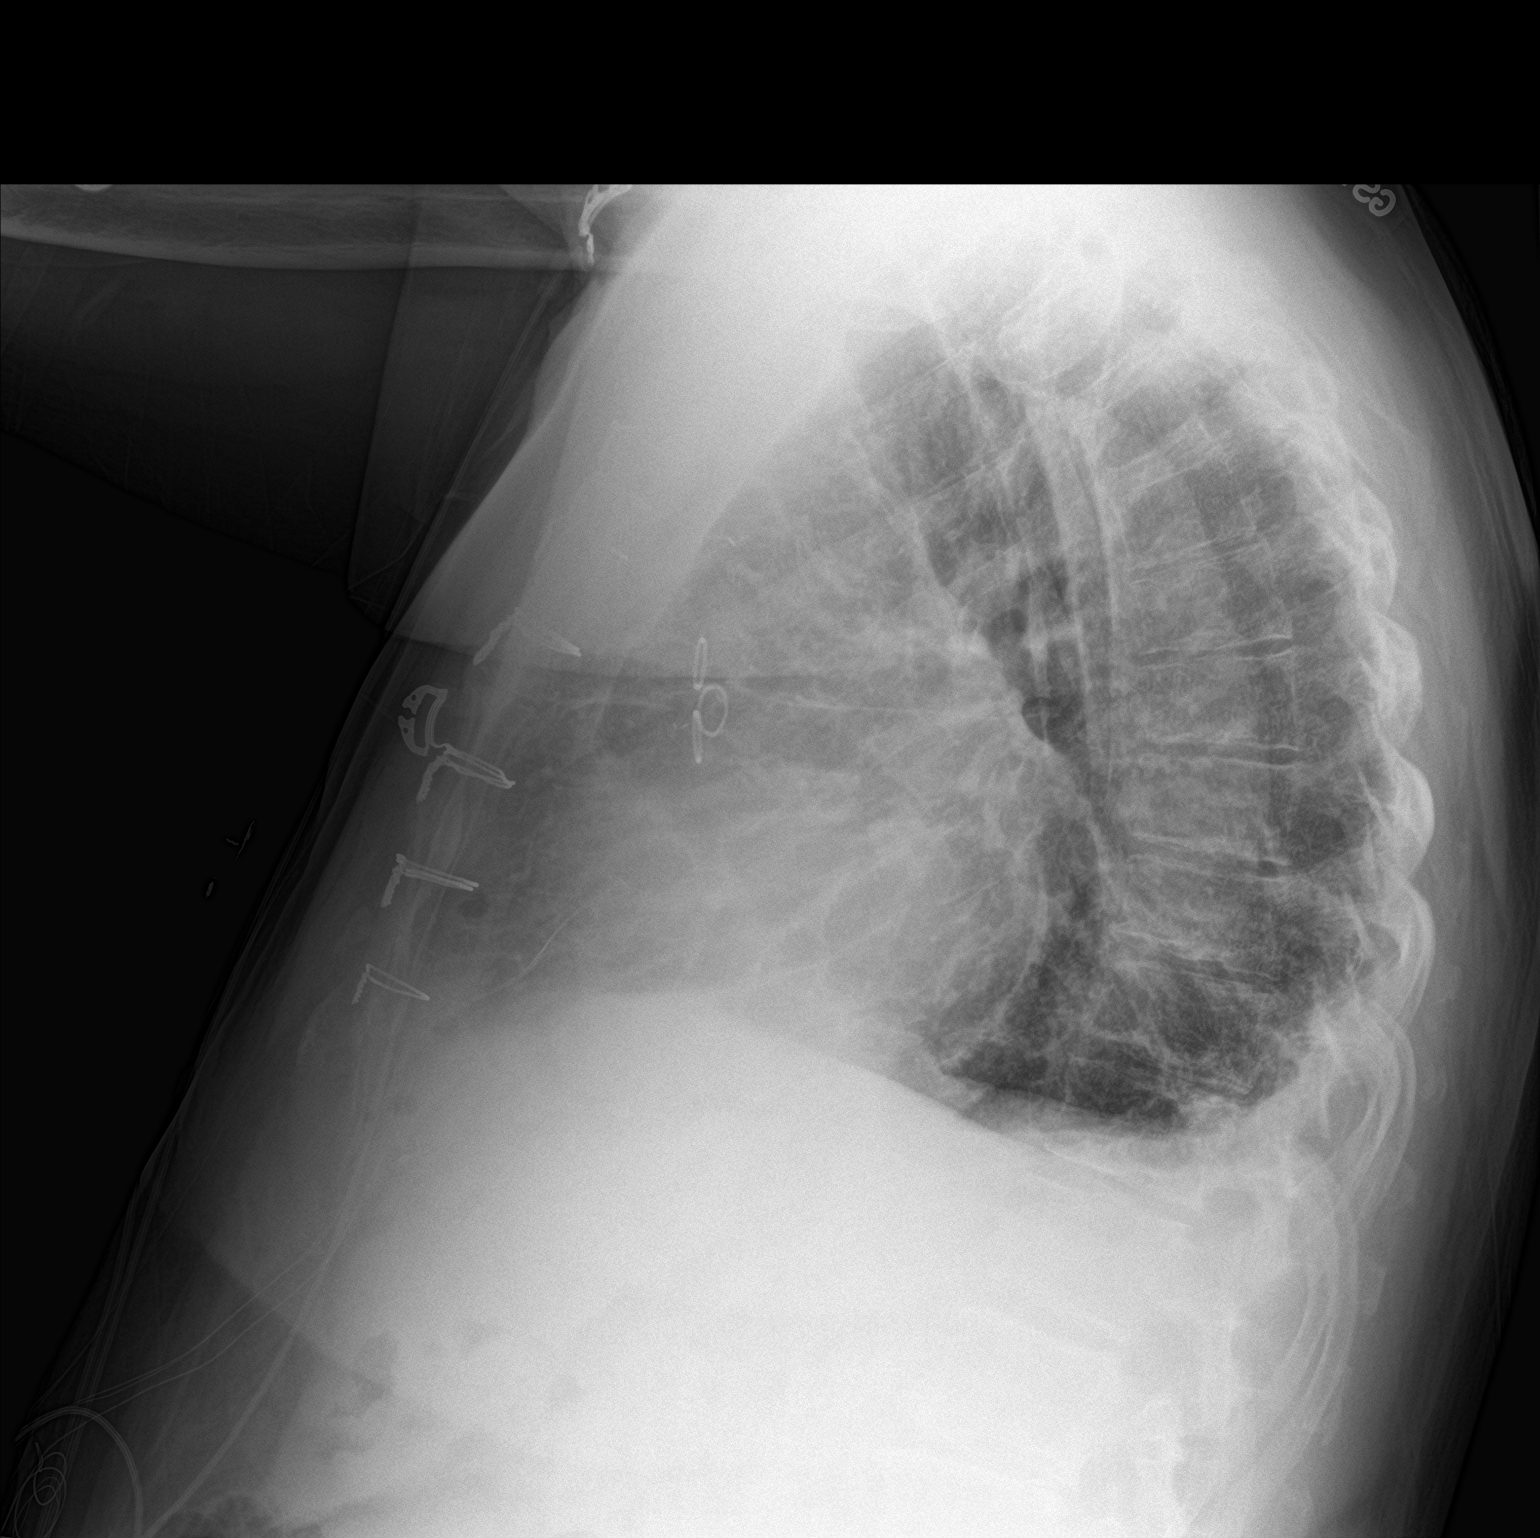

[2 of 2 positions shown; findings below may reference images not displayed]

FINDINGS: Right IJ sheath has been removed. Stable cardiomegaly. CABG. Mildly
improved lung volumes. There is mild atelectasis and trace
effusions. Negative for pneumothorax.
IMPRESSION: Mildly improved aeration.  Trace pleural effusions.

## 2019-05-15 DIAGNOSIS — E782 Mixed hyperlipidemia: Secondary | ICD-10-CM | POA: Diagnosis not present

## 2019-05-15 DIAGNOSIS — E1165 Type 2 diabetes mellitus with hyperglycemia: Secondary | ICD-10-CM | POA: Diagnosis not present

## 2019-06-14 DIAGNOSIS — E782 Mixed hyperlipidemia: Secondary | ICD-10-CM | POA: Diagnosis not present

## 2019-06-14 DIAGNOSIS — I5021 Acute systolic (congestive) heart failure: Secondary | ICD-10-CM | POA: Diagnosis not present

## 2019-06-28 DIAGNOSIS — R69 Illness, unspecified: Secondary | ICD-10-CM | POA: Diagnosis not present

## 2019-07-02 ENCOUNTER — Other Ambulatory Visit: Payer: Self-pay | Admitting: *Deleted

## 2019-07-02 DIAGNOSIS — R918 Other nonspecific abnormal finding of lung field: Secondary | ICD-10-CM

## 2019-07-12 ENCOUNTER — Other Ambulatory Visit: Payer: Self-pay | Admitting: Cardiology

## 2019-08-01 ENCOUNTER — Ambulatory Visit
Admission: RE | Admit: 2019-08-01 | Discharge: 2019-08-01 | Disposition: A | Discharge status: Home / Self Care | Payer: Medicare HMO | Source: Ambulatory Visit | Attending: Cardiothoracic Surgery | Admitting: Cardiothoracic Surgery

## 2019-08-01 ENCOUNTER — Other Ambulatory Visit: Payer: Self-pay

## 2019-08-01 ENCOUNTER — Ambulatory Visit (INDEPENDENT_AMBULATORY_CARE_PROVIDER_SITE_OTHER): Payer: Medicare HMO | Admitting: Cardiothoracic Surgery

## 2019-08-01 DIAGNOSIS — R918 Other nonspecific abnormal finding of lung field: Secondary | ICD-10-CM

## 2019-08-01 NOTE — Progress Notes (Signed)
Telehealth Visit     Virtual Visit via Telephone Note   This visit type was conducted due to national recommendations for restrictions regarding the COVID-19 Pandemic (e.g. social distancing) in an effort to limit this patient's exposure and mitigate transmission in our community.  Due to his co-morbid illnesses, this patient is at least at moderate risk for complications without adequate follow up.  This format is felt to be most appropriate for this patient at this time.  The patient did not have access to video technology/had technical difficulties with video requiring transitioning to audio format only (telephone).  All issues noted in this document were discussed and addressed.  No physical exam could be performed with this format.    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 SURGICAL PROCEDURE:  Coronary artery bypass grafting x5 with the left internal mammary to the left anterior descending coronary artery, reverse saphenous vein graft to the diagonal coronary artery, reverse saphenous vein graft to the obtuse marginal  coronary artery, sequential reverse saphenous vein graft to the posterior descending and posterolateral branches of the right coronary artery with right greater saphenous endoscopic vein harvesting thigh and calf. SURGEON:  Lanelle Bal, MD  History of Present Illness:  Patient underwent coronary artery bypass grafting in  June 2019 , a month postop he was seen in the emergency room for nausea and vomiting CT scan of the chest was done which suggested multiple small pulmonary nodules.  Subsequent follow-up CT scans have been done.  Patient returns today for a follow-up CT scan concerning his small pulmonary nodules  He has been doing well from a cardiac standpoint since his surgery, he denies any chest pain or evidence of congestive heart failure.  Patient is a previous smoker but quit smoking more than 18 years ago    Past Medical History:  Diagnosis Date  . Allergy   . Diabetes mellitus without complication (HCC)      Social History   Tobacco Use  Smoking Status Former Smoker  . Packs/day: 2.00  . Years: 15.00  . Pack years: 30.00  . Types: Cigarettes  . Quit date: 08/16/1996  . Years since quitting: 22.9  Smokeless Tobacco Never Used    Social History   Substance and Sexual Activity  Alcohol Use No     Allergies  Allergen Reactions  . Benadryl [Diphenhydramine Hcl] Swelling  . Iodinated Diagnostic Agents Other (See Comments)    Unknown  . Morphine And Related Other (See Comments)    Unknown    Current Outpatient Medications  Medication Sig Dispense Refill  . acetaminophen (TYLENOL) 325 MG tablet Take 2 tablets (650 mg total) by mouth every 6 (six) hours as needed for mild pain.    Marland Kitchen aspirin EC 81 MG tablet Take 81 mg by mouth daily.    Marland Kitchen atorvastatin (LIPITOR) 80 MG tablet Take 1 tablet (80 mg total) by mouth daily at 6 PM. 30 tablet 1  .  carvedilol (COREG) 3.125 MG tablet Take 1 tablet (3.125 mg total) by mouth 2 (two) times daily with a meal. 180 tablet 1  . furosemide (LASIX) 40 MG tablet Take 1 tablet (40 mg total) by mouth daily. 30 tablet 0  . lisinopril (ZESTRIL) 2.5 MG tablet Take 1 tablet by mouth once daily 30 tablet 1  . metFORMIN (GLUCOPHAGE-XR) 500 MG 24 hr tablet Take 500 mg by mouth 2 (two) times daily.      No current facility-administered medications for this  visit.       Physical Exam: There were no vitals taken for this visit.   Diagnostic Studies & Laboratory data:     Recent Radiology Findings:  CT Chest Wo Contrast  Result Date: 08/01/2019 CLINICAL DATA:  Follow-up lung nodule.  Former smoker. EXAM: CT CHEST WITHOUT CONTRAST TECHNIQUE: Multidetector CT imaging of the chest was performed following the standard protocol without IV contrast. COMPARISON:  08/02/2018 FINDINGS: Cardiovascular: Previous median sternotomy and CABG procedure. Aortic atherosclerosis. No pericardial effusion.Stable ectatic ascending thoracic aorta measuring 3.7 cm on today's exam, image 69/4. Mediastinum/Nodes: No enlarged mediastinal or axillary lymph nodes. Thyroid gland, trachea, and esophagus demonstrate no significant findings. Lungs/Pleura: No pleural effusion identified. Mild centrilobular emphysema. Left upper lobe nodule measures 5 mm, image 56/8.  Unchanged. Lingular nodule measures 4 mm, image 64/8.  Also unchanged. Posterior right upper lobe nodule measures 4 mm, image 43/8. Unchanged. No new or enlarging pulmonary nodules. Upper Abdomen: The liver is cirrhotic. Multiple gallstones are noted measuring up to 9 mm. No acute findings within the upper abdomen. Musculoskeletal: No chest wall mass or suspicious bone lesions identified. IMPRESSION: 1. Unchanged appearance of small pulmonary nodules in both lungs measuring up to 5 mm. No further follow-up is indicated at this time. This recommendation follows the consensus statement: Guidelines for Management of Incidental Pulmonary Nodules Detected on CT Images: From the Fleischner Society 2017; Radiology 2017; 284:228-243. 2. Stable ectatic ascending thoracic aorta. 3. Hepatic cirrhosis 4. Gallstones Aortic Atherosclerosis (ICD10-I70.0) and Emphysema (ICD10-J43.9). Electronically Signed   By: Kerby Moors M.D.   On: 08/01/2019 12:34    Ct Chest Wo Contrast  Result Date: 08/02/2018 CLINICAL DATA:  Follow-up  pulmonary nodules.  Former smoker. EXAM: CT CHEST WITHOUT CONTRAST TECHNIQUE: Multidetector CT imaging of the chest was performed following the standard protocol without IV contrast. COMPARISON:  03/13/2018 chest CT angiogram. 06/14/2018 chest radiograph. FINDINGS: Cardiovascular: Normal heart size. No significant pericardial effusion/thickening. Left main and 3 vessel coronary atherosclerosis status post CABG. Atherosclerotic thoracic aorta with stable ectatic 4.0 cm ascending thoracic aorta. Normal caliber pulmonary arteries. Mediastinum/Nodes: No discrete thyroid nodules. Unremarkable esophagus. No pathologically enlarged axillary, mediastinal or hilar lymph nodes, noting limited sensitivity for the detection of hilar adenopathy on this noncontrast study. Lungs/Pleura: No pneumothorax. No pleural effusion. Mild centrilobular and paraseptal emphysema. No acute consolidative airspace disease or lung masses. Several small solid pulmonary nodules scattered throughout both lungs, largest 6 mm in the left upper lobe (series 8/image 59), all stable since 03/13/2018 chest CT angiogram study. No new significant pulmonary nodules. Upper abdomen: Liver surface is diffusely irregular and lateral segment left liver lobe is relatively hypertrophic, compatible with hepatic cirrhosis. Cholelithiasis. Musculoskeletal: No aggressive appearing focal osseous lesions. Intact sternotomy wires. Moderate thoracic spondylosis. IMPRESSION: 1. Stable scattered small solid pulmonary nodules, largest 6 mm, for which 4 month stability has been demonstrated, more likely benign. Follow-up noncontrast chest CT in 14-20 months is considered optional for low-risk patients, but is recommended  for high-risk patients. This recommendation follows the consensus statement: Guidelines for Management of Incidental Pulmonary Nodules Detected on CT Images: From the Fleischner Society 2017; Radiology 2017; 284:228-243. 2. Stable ectatic 4.0 cm ascending  thoracic aorta. Recommend annual imaging followup by CTA or MRA. This recommendation follows 2010 ACCF/AHA/AATS/ACR/ASA/SCA/SCAI/SIR/STS/SVM Guidelines for the Diagnosis and Management of Patients with Thoracic Aortic Disease. Circulation. 2010; 121: [id number]. 3. Hepatic cirrhosis. 4. Cholelithiasis. Aortic Atherosclerosis (ICD10-I70.0) and Emphysema (ICD10-J43.9). Electronically Signed   By: Ilona Sorrel M.D.   On: 08/02/2018 11:14    I have independently reviewed the above radiology studies  and reviewed the findings with the patient.  CLINICAL DATA:  Pain, nausea, vomiting. 1 month ago coronary bypass grafting.  EXAM: CT ANGIOGRAPHY CHEST, ABDOMEN AND PELVIS  TECHNIQUE: Multidetector CT imaging through the chest, abdomen and pelvis was performed using the standard protocol during bolus administration of intravenous contrast. Multiplanar reconstructed images and MIPs were obtained and reviewed to evaluate the vascular anatomy.  CONTRAST:  47mL ISOVUE-370 IOPAMIDOL (ISOVUE-370) INJECTION 76%  COMPARISON:  08/09/2012  FINDINGS: CTA CHEST FINDINGS  Cardiovascular: Heart size normal. Trace pericardial effusion. Fair contrast opacification of pulmonary artery branches; the exam was not optimized for detection of pulmonary emboli. Patency of at least 3 coronary bypass grafts. 4 cm dilatation of the ascending thoracic aorta. Partially calcified atheromatous plaque in the aortic arch and descending thoracic segment with some eccentric nonocclusive mural thrombus in the mid descending segment. No dissection or stenosis. Classic 3 vessel brachiocephalic arterial origin anatomy without proximal stenosis.  Mediastinum/Nodes: No hilar or mediastinal adenopathy. No mediastinal hematoma.  Lungs/Pleura: No pleural effusion. Small subpleural blebs in both lung apices. 6 mm nodule in the posterior left upper lobe image 68/8, and a 5 mm nodule in the lingula image 78/8. no  pneumothorax.  Musculoskeletal: Changes of median sternotomy. Spondylitic changes in the visualized lower cervical spine. No fracture or worrisome bone lesion  Review of the MIP images confirms the above findings.  CTA ABDOMEN AND PELVIS FINDINGS  VASCULAR  Aorta: Moderate atheromatous plaque and nonocclusive mural thrombus in the abdominal aorta, ectatic distally up to 2.8 cm, without aneurysm, dissection, or stenosis.  Celiac: Patent without evidence of aneurysm, dissection, vasculitis or significant stenosis.  SMA: Patent without evidence of aneurysm, dissection, vasculitis or significant stenosis.  Renals: Single right renal artery with calcified ostial partially calcified ostial plaque resulting in short segment stenosis, and there is proximal bifurcation into anterior and posterior divisions both of which are patent.  Duplicated left renal arteries, superior dominant with a partially calcified ostial short segment stenosis of possible hemodynamic significance, patent distally.  IMA: Patent without evidence of aneurysm, dissection, vasculitis or significant stenosis.  Inflow: Patent without evidence of aneurysm, dissection, vasculitis or significant stenosis.  Veins: No obvious venous abnormality within the limitations of this arterial phase study.  Review of the MIP images confirms the above findings.  NON-VASCULAR  Hepatobiliary: Somewhat nodular hepatic contour suggesting cirrhosis, without focal lesion or biliary ductal dilatation. Multiple partially calcified stones measuring up to 1.1 cm in the lumen of the nondilated gallbladder.  Pancreas: Unremarkable. No pancreatic ductal dilatation or surrounding inflammatory changes.  Spleen: Normal in size without focal abnormality.  Adrenals/Urinary Tract: Multiple calculi in the lower pole right renal collecting system, measured up to 1.2 cm. No hydronephrosis. No focal renal mass. Normal  adrenals. Urinary bladder unremarkable.  Stomach/Bowel: Stomach and small bowel are nondilated. Fecalization of contents in the distal ileum. Normal appendix. Colon is nondilated,  unremarkable.  Lymphatic: No abdominal or pelvic adenopathy.  Reproductive: Prostate is unremarkable.  Other: No ascites.  No free air.  Musculoskeletal: Umbilical hernia containing only mesenteric fat. Spondylitic changes in the lower lumbar spine. No fracture or worrisome bone lesion.  Review of the MIP images confirms the above findings.  IMPRESSION: 1. No acute findings post CABG. 2. 4 cm ascending thoracic aortic aneurysm with Aortic Atherosclerosis (ICD10-170.0). Recommend annual imaging followup by CTA or MRA. This recommendation follows 2010 ACCF/AHA/AATS/ACR/ASA/SCA/SCAI/SIR/STS/SVM Guidelines for the Diagnosis and Management of Patients with Thoracic Aortic Disease. Circulation. 2010; 121: [id number] 3. Left lung nodules up to 6 mm as above. Non-contrast chest CT at 3-6 months is recommended. If the nodules are stable at time of repeat CT, then future CT at 18-24 months (from today's scan) is considered optional for low-risk patients, but is recommended for high-risk patients. This recommendation follows the consensus statement: Guidelines for Management of Incidental Pulmonary Nodules Detected on CT Images: From the Fleischner Society 2017; Radiology 2017; 284:228-243. 4. Bilateral ostial renal artery stenosis. 5. Right nephrolithiasis without hydronephrosis. 6. Cholelithiasis without biliary ductal dilatation. 7. Umbilical hernia containing only mesenteric fat.   Electronically Signed   By: Lucrezia Europe M.D.   On: 03/13/2018 13:05  Recent Lab Findings: Lab Results  Component Value Date   WBC 11.2 (H) 03/13/2018   HGB 12.1 (L) 03/13/2018   HCT 36.6 (L) 03/13/2018   PLT 176 03/13/2018   GLUCOSE 172 (H) 03/13/2018   ALT 26 02/07/2018   AST 27 02/07/2018   NA 135  03/13/2018   K 4.1 03/13/2018   CL 97 (L) 03/13/2018   CREATININE 1.00 03/13/2018   BUN 23 03/13/2018   CO2 24 03/13/2018   INR 1.54 02/08/2018   HGBA1C 9.5 (H) 02/07/2018      Assessment / Plan:   #1 stable after coronary artery bypass grafting in June 2019 without recurrent signs or symptoms of congestive heart failure #2 incidental finding on CT scan of chest done in the emergency room July 2019'  Follow-up CT scan done today shows no change in multiple small pulmonary nodules-the patient has been a smoker in the past but quit 19  years ago. Not eligible for lung cancer screening  ct since quit smoking more then 15 years ago     Unchanged appearance of small pulmonary nodules in both lungs measuring up to 5 mm. No further follow-up is indicated at this time. This recommendation follows the consensus statement: Guidelines for Management of Incidental Pulmonary Nodules Detected on CT Images: From the Fleischner Society 2017; Radiology 2017; 284:228-243  Stable ectatic ascending thoracic aorta.  Hepatic cirrhosis Gallstones  Grace Isaac MD      [address] [place] [address] Office [phone]   Beeper [phone]  08/01/2019 6:17 PM

## 2019-08-15 DIAGNOSIS — E782 Mixed hyperlipidemia: Secondary | ICD-10-CM | POA: Diagnosis not present

## 2019-08-15 DIAGNOSIS — I5021 Acute systolic (congestive) heart failure: Secondary | ICD-10-CM | POA: Diagnosis not present

## 2019-08-23 DIAGNOSIS — N183 Chronic kidney disease, stage 3 unspecified: Secondary | ICD-10-CM | POA: Diagnosis not present

## 2019-08-23 DIAGNOSIS — D649 Anemia, unspecified: Secondary | ICD-10-CM | POA: Diagnosis not present

## 2019-08-23 DIAGNOSIS — E782 Mixed hyperlipidemia: Secondary | ICD-10-CM | POA: Diagnosis not present

## 2019-08-23 DIAGNOSIS — R5383 Other fatigue: Secondary | ICD-10-CM | POA: Diagnosis not present

## 2019-08-23 DIAGNOSIS — E1165 Type 2 diabetes mellitus with hyperglycemia: Secondary | ICD-10-CM | POA: Diagnosis not present

## 2019-09-13 ENCOUNTER — Other Ambulatory Visit: Payer: Self-pay

## 2019-09-13 ENCOUNTER — Encounter: Payer: Self-pay | Admitting: *Deleted

## 2019-09-13 ENCOUNTER — Encounter: Payer: Self-pay | Admitting: Cardiology

## 2019-09-13 ENCOUNTER — Other Ambulatory Visit: Payer: Self-pay | Admitting: Cardiology

## 2019-09-13 ENCOUNTER — Ambulatory Visit: Payer: Medicare HMO | Admitting: Cardiology

## 2019-09-13 VITALS — BP 135/76 | HR 78 | Ht 74.0 in | Wt 228.2 lb

## 2019-09-13 DIAGNOSIS — Z1212 Encounter for screening for malignant neoplasm of rectum: Secondary | ICD-10-CM | POA: Diagnosis not present

## 2019-09-13 DIAGNOSIS — E782 Mixed hyperlipidemia: Secondary | ICD-10-CM | POA: Diagnosis not present

## 2019-09-13 DIAGNOSIS — N183 Chronic kidney disease, stage 3 unspecified: Secondary | ICD-10-CM | POA: Diagnosis not present

## 2019-09-13 DIAGNOSIS — I2510 Atherosclerotic heart disease of native coronary artery without angina pectoris: Secondary | ICD-10-CM

## 2019-09-13 DIAGNOSIS — Z6830 Body mass index (BMI) 30.0-30.9, adult: Secondary | ICD-10-CM | POA: Diagnosis not present

## 2019-09-13 DIAGNOSIS — I5021 Acute systolic (congestive) heart failure: Secondary | ICD-10-CM | POA: Diagnosis not present

## 2019-09-13 DIAGNOSIS — E7849 Other hyperlipidemia: Secondary | ICD-10-CM | POA: Diagnosis not present

## 2019-09-13 DIAGNOSIS — Z23 Encounter for immunization: Secondary | ICD-10-CM | POA: Diagnosis not present

## 2019-09-13 DIAGNOSIS — E1159 Type 2 diabetes mellitus with other circulatory complications: Secondary | ICD-10-CM | POA: Diagnosis not present

## 2019-09-13 DIAGNOSIS — Z0001 Encounter for general adult medical examination with abnormal findings: Secondary | ICD-10-CM | POA: Diagnosis not present

## 2019-09-13 DIAGNOSIS — E1122 Type 2 diabetes mellitus with diabetic chronic kidney disease: Secondary | ICD-10-CM | POA: Diagnosis not present

## 2019-09-13 NOTE — Patient Instructions (Signed)

## 2019-09-13 NOTE — Progress Notes (Signed)
Clinical Summary [name] is a 69 y.o.male seen today for follow up of the following medical problems.    1.Chronic systolic HF/ICM/CAD - pcp note from 01/10/18 reports recent leg edema, SOB.  - CXR 01/04/18 with pulm edema  - 12/2017 echo LVEF 35-40%, mild to mod MR - 01/2018 cath as reported below, severe triple vessel disease. - 02/08/18 CABG x 5 (LIMA-LAD, SVG-diag, SVG-OM, SVg-PDA and PL sequential)   - 06/2018 echo: LVEF 60-65%, no WMAs   - no recent chest pain.  - compliant with meds   2. Lung nodules - followed by CT surgery  3. Hyperlipidemia - 10/2018 TC 96 TG 67 HDL 41 LDL 42 - compliant with statin  4. AAA screen - 2.8 cm abdominal aorta by 2019 CTA  SH: tractor trailerdriver, back to work    Past Medical History:  Diagnosis Date  . Allergy   . Diabetes mellitus without complication (HCC)      Allergies  Allergen Reactions  . Benadryl [Diphenhydramine Hcl] Swelling  . Iodinated Diagnostic Agents Other (See Comments)    Unknown  . Morphine And Related Other (See Comments)    Unknown     Current Outpatient Medications  Medication Sig Dispense Refill  . acetaminophen (TYLENOL) 325 MG tablet Take 2 tablets (650 mg total) by mouth every 6 (six) hours as needed for mild pain.    Marland Kitchen aspirin EC 81 MG tablet Take 81 mg by mouth daily.    Marland Kitchen atorvastatin (LIPITOR) 80 MG tablet Take 1 tablet (80 mg total) by mouth daily at 6 PM. 30 tablet 1  . carvedilol (COREG) 3.125 MG tablet Take 1 tablet (3.125 mg total) by mouth 2 (two) times daily with a meal. 180 tablet 1  . furosemide (LASIX) 40 MG tablet Take 1 tablet (40 mg total) by mouth daily. 30 tablet 0  . lisinopril (ZESTRIL) 2.5 MG tablet Take 1 tablet by mouth once daily 30 tablet 6  . metFORMIN (GLUCOPHAGE-XR) 500 MG 24 hr tablet Take 500 mg by mouth 2 (two) times daily.      No current facility-administered medications for this visit.     Past Surgical History:  Procedure  Laterality Date  . BALLOON DILATION  08/21/2012   Procedure: BALLOON DILATION;  Surgeon: Alexander Nestle, MD;  Location: [facility];  Service: Urology;  Laterality: Right;  Balloon Dilation Right Ureter  . CORONARY ARTERY BYPASS GRAFT N/A 02/08/2018   Procedure: CORONARY ARTERY BYPASS GRAFT times five, LIMA-LAD, SVG-DIAG, SVG-OM, SVG-PD-PL(SEQ), using left internal mammary artery and Endoharvest of Right greater saphenous vein;  Surgeon: Alexander Isaac, MD;  [facility]: [facility];  Service: Open Heart Surgery;  Laterality: N/A;  . CYSTOSCOPY W/ URETERAL STENT PLACEMENT  08/21/2012   Procedure: CYSTOSCOPY WITH RETROGRADE PYELOGRAM/URETERAL STENT PLACEMENT;  Surgeon: Alexander Nestle, MD;  Location: [facility];  Service: Urology;  Laterality: Right;  Cystoscopy with Right Retrograde Pyelogram, Right Ureteral Stent Placement  . HERNIA REPAIR    . HOLMIUM LASER APPLICATION  [id number]   Procedure: HOLMIUM LASER APPLICATION;  Surgeon: Alexander Nestle, MD;  Location: [facility];  Service: Urology;  Laterality: Right;  Holmium Laser Application to Right Ureteral Calculus  . INTRAOPERATIVE TRANSESOPHAGEAL ECHOCARDIOGRAM N/A 02/08/2018   Procedure: INTRAOPERATIVE TRANSESOPHAGEAL ECHOCARDIOGRAM;  Surgeon: Alexander Isaac, MD;  Location: [facility];  Service: Open Heart Surgery;  Laterality: N/A;  . RIGHT/LEFT HEART CATH AND CORONARY ANGIOGRAPHY N/A 02/07/2018   Procedure: RIGHT/LEFT HEART CATH AND CORONARY ANGIOGRAPHY;  Surgeon: Alexander Blanks, MD;  Location: [facility];  Service: Cardiovascular;  Laterality: N/A;  . STONE EXTRACTION WITH BASKET  08/21/2012   Procedure: STONE EXTRACTION WITH BASKET;  Surgeon: Alexander Nestle, MD;  Location: [facility];  Service: Urology;  Laterality: Right;  Right Ureteral Stone Basket Extraction     Allergies  Allergen Reactions  . Benadryl [Diphenhydramine Hcl] Swelling  . Iodinated Diagnostic Agents Other (See Comments)    Unknown  . Morphine And Related Other (See  Comments)    Unknown      Family History  Problem Relation Age of Onset  . Cancer Mother   . Cancer Sister   . Liver disease Brother      Social History [name] reports that he quit smoking about 23 years ago. His smoking use included cigarettes. He has a 30.00 pack-year smoking history. He has never used smokeless tobacco. [name] reports no history of alcohol use.   Review of Systems CONSTITUTIONAL: No weight loss, fever, chills, weakness or fatigue.  HEENT: Eyes: No visual loss, blurred vision, double vision or yellow sclerae.No hearing loss, sneezing, congestion, runny nose or sore throat.  SKIN: No rash or itching.  CARDIOVASCULAR: per hpi RESPIRATORY: No shortness of breath, cough or sputum.  GASTROINTESTINAL: No anorexia, nausea, vomiting or diarrhea. No abdominal pain or blood.  GENITOURINARY: No burning on urination, no polyuria NEUROLOGICAL: No headache, dizziness, syncope, paralysis, ataxia, numbness or tingling in the extremities. No change in bowel or bladder control.  MUSCULOSKELETAL: No muscle, back pain, joint pain or stiffness.  LYMPHATICS: No enlarged nodes. No history of splenectomy.  PSYCHIATRIC: No history of depression or anxiety.  ENDOCRINOLOGIC: No reports of sweating, cold or heat intolerance. No polyuria or polydipsia.  Marland Kitchen   Physical Examination Vitals:   09/13/19 1014  BP: 135/76  Pulse: 78  SpO2: 98%   Filed Weights   09/13/19 1014  Weight: 228 lb 3.2 oz (103.5 kg)    Gen: resting comfortably, no acute distress HEENT: no scleral icterus, pupils equal round and reactive, no palptable cervical adenopathy,  CV: RRR, no m/r/g, no jvd Resp: Clear to auscultation bilaterally GI: abdomen is soft, non-tender, non-distended, normal bowel sounds, no hepatosplenomegaly MSK: extremities are warm, no edema.  Skin: warm, no rash Neuro:  no focal deficits Psych: appropriate affect   Diagnostic Studies  12/2017 echo Study Conclusions  -  Left ventricle: The cavity size was normal. Wall thickness was normal. Systolic function was moderately reduced. The estimated ejection fraction was in the range of 35% to 40%. Diffuse hypokinesis. The study is not technically sufficient to allow evaluation of LV diastolic function. - Aortic valve: Mildly calcified annulus. Trileaflet; mildly thickened leaflets. There was mild regurgitation. Valve area (VTI): 2.49 cm^2. Valve area (Vmax): 2.43 cm^2. Valve area (Vmean): 2.09 cm^2. - Mitral valve: There was mild to moderate regurgitation. - Left atrium: The atrium was moderately dilated. - Right ventricle: The cavity size was mildly to moderately dilated. Systolic function was mildly reduced. - Right atrium: The atrium was mildly dilated. - Pulmonary arteries: Systolic pressure was mildly increased. PA peak pressure: 34 mm Hg (S). - Pericardium, extracardiac: There is a left pleural effusion.  6/2019cath  Prox RCA lesion is 99% stenosed.  Post Atrio lesion is 99% stenosed.  Ost RPDA lesion is 50% stenosed.  Mid RCA lesion is 20% stenosed.  Ost LM to Mid LM lesion is 90% stenosed.  Mid LM to Dist LM lesion is 60% stenosed.  Prox Cx to Mid Cx lesion is 99% stenosed.  Prox LAD lesion is 99% stenosed.  Ost 1st Diag lesion is 80% stenosed.  Ost LAD to Prox LAD lesion is 30% stenosed.  1. Severe triple vessel CAD  2. Severe ostial left main stenosis 3. Severe stenosis mid LAD and in the small to moderate caliber diagonal Shaan Rhoads 4. Severe stenosis in the mid Circumflex leading into the large obtuse marginal Brandii Lakey 5. Severe stenosis in the proximal segment of the large dominant RCA. Severe stenosis in the posterolateral Aleyda Gindlesperger.  6. LVEF 35-40% by echo  Post cathRecommendations: Will admit to telemetry unit and consult CT surgery for CABG. He is having no chest pain or dyspnea. Will start ASA and high intensity statin. Continue beta blocker, Ace-inh. Will  start IV heparin 6 hours post sheath pull.   01/2018 Carotid US 1-39% bilateral disease   01/2018 ABI Normal bilateral  06/2018 echo Study Conclusions  - Left ventricle: The cavity size was normal. Wall thickness was increased in a pattern of mild LVH. Systolic function was normal. The estimated ejection fraction was in the range of 60% to 65%. Wall motion was normal; there were no regional wall motion abnormalities. Indeterminate diastolic function. - Aortic valve: Trileaflet; moderately calcified leaflets. - Mitral valve: Mildly calcified annulus. - Right atrium: Central venous pressure (est): 3 mm Hg. - Atrial septum: No defect or patent foramen ovale was identified. - Tricuspid valve: There was trivial regurgitation. - Pulmonary arteries: PA peak pressure: 11 mm Hg (S). - Pericardium, extracardiac: There was no pericardial effusion.  Impressions:  - LVEF has normalized in comparison to prior study from May.   Assessment and Plan   1.CAD - doing well, no symptoms. LVEF recovered after his CAGB - continue current meds - EKG today shows SR, no acute ischemic changes  2. Hyperlipidemia - request pcp labs, continue statin       Arnoldo Lenis, M.D

## 2019-09-27 ENCOUNTER — Other Ambulatory Visit: Payer: Self-pay | Admitting: Cardiology

## 2019-10-11 DIAGNOSIS — I10 Essential (primary) hypertension: Secondary | ICD-10-CM | POA: Diagnosis not present

## 2019-10-11 DIAGNOSIS — E7849 Other hyperlipidemia: Secondary | ICD-10-CM | POA: Diagnosis not present

## 2019-12-03 DIAGNOSIS — K8020 Calculus of gallbladder without cholecystitis without obstruction: Secondary | ICD-10-CM | POA: Diagnosis not present

## 2019-12-03 DIAGNOSIS — Z6830 Body mass index (BMI) 30.0-30.9, adult: Secondary | ICD-10-CM | POA: Diagnosis not present

## 2019-12-13 DIAGNOSIS — I5021 Acute systolic (congestive) heart failure: Secondary | ICD-10-CM | POA: Diagnosis not present

## 2019-12-13 DIAGNOSIS — I10 Essential (primary) hypertension: Secondary | ICD-10-CM | POA: Diagnosis not present

## 2019-12-17 ENCOUNTER — Other Ambulatory Visit: Payer: Self-pay

## 2019-12-17 ENCOUNTER — Encounter: Payer: Self-pay | Admitting: General Surgery

## 2019-12-17 ENCOUNTER — Ambulatory Visit: Payer: Medicare HMO | Admitting: General Surgery

## 2019-12-17 ENCOUNTER — Other Ambulatory Visit (HOSPITAL_COMMUNITY)
Admission: RE | Admit: 2019-12-17 | Discharge: 2019-12-17 | Disposition: A | Discharge status: Home / Self Care | Payer: Medicare HMO | Source: Ambulatory Visit | Attending: General Surgery | Admitting: General Surgery

## 2019-12-17 VITALS — BP 122/76 | HR 75 | Temp 97.4°F | Resp 14 | Ht 74.0 in | Wt 227.0 lb

## 2019-12-17 DIAGNOSIS — K8020 Calculus of gallbladder without cholecystitis without obstruction: Secondary | ICD-10-CM

## 2019-12-17 DIAGNOSIS — K7460 Unspecified cirrhosis of liver: Secondary | ICD-10-CM | POA: Diagnosis not present

## 2019-12-17 LAB — BASIC METABOLIC PANEL
Anion gap: 11 (ref 5–15)
BUN: 25 mg/dL — ABNORMAL HIGH (ref 8–23)
CO2: 25 mmol/L (ref 22–32)
Calcium: 9.6 mg/dL (ref 8.9–10.3)
Chloride: 101 mmol/L (ref 98–111)
Creatinine, Ser: 1.19 mg/dL (ref 0.61–1.24)
GFR calc Af Amer: >60 mL/min (ref >60)
GFR calc non Af Amer: >60 mL/min (ref >60)
Glucose, Bld: 141 mg/dL — ABNORMAL HIGH (ref 70–99)
Potassium: 4.3 mmol/L (ref 3.5–5.1)
Sodium: 137 mmol/L (ref 135–145)

## 2019-12-17 LAB — CBC WITH DIFFERENTIAL/PLATELET
Abs Immature Granulocytes: 0.07 10*3/uL (ref 0.00–0.07)
Basophils Absolute: 0 10*3/uL (ref 0.0–0.1)
Basophils Relative: 0 %
Eosinophils Absolute: 0.3 10*3/uL (ref 0.0–0.5)
Eosinophils Relative: 4 %
HCT: 39.7 % (ref 39.0–52.0)
Hemoglobin: 12.6 g/dL — ABNORMAL LOW (ref 13.0–17.0)
Immature Granulocytes: 1 %
Lymphocytes Relative: 27 %
Lymphs Abs: 2.2 10*3/uL (ref 0.7–4.0)
MCH: 28.3 pg (ref 26.0–34.0)
MCHC: 31.7 g/dL (ref 30.0–36.0)
MCV: 89 fL (ref 80.0–100.0)
Monocytes Absolute: 1 10*3/uL (ref 0.1–1.0)
Monocytes Relative: 13 %
Neutro Abs: 4.4 10*3/uL (ref 1.7–7.7)
Neutrophils Relative %: 55 %
Platelets: 141 10*3/uL — ABNORMAL LOW (ref 150–400)
RBC: 4.46 MIL/uL (ref 4.22–5.81)
RDW: 14.6 % (ref 11.5–15.5)
WBC: 8 10*3/uL (ref 4.0–10.5)
nRBC: 0 % (ref 0.0–0.2)

## 2019-12-17 LAB — HEPATIC FUNCTION PANEL
ALT: 41 U/L (ref 0–44)
AST: 30 U/L (ref 15–41)
Albumin: 3.9 g/dL (ref 3.5–5.0)
Alkaline Phosphatase: 86 U/L (ref 38–126)
Bilirubin, Direct: 0.2 mg/dL (ref 0.0–0.2)
Indirect Bilirubin: 1.5 mg/dL — ABNORMAL HIGH (ref 0.3–0.9)
Total Bilirubin: 1.7 mg/dL — ABNORMAL HIGH (ref 0.3–1.2)
Total Protein: 7.8 g/dL (ref 6.5–8.1)

## 2019-12-17 LAB — PROTIME-INR
INR: 1.1 (ref 0.8–1.2)
Prothrombin Time: 13.8 seconds (ref 11.4–15.2)

## 2019-12-17 NOTE — Patient Instructions (Addendum)
Please go to [facility] today.  Ultrasound scheduled 12/23/19 @ 10:30 [facility]    See Cardiology to ensure safe for surgery. See Gastroenterologist for cirrhosis and discussion of screening colonoscopy.   Labs to further evaluate your liver for cirrhosis. Ultrasound to further look at your gallstones and liver.    Gallbladder Eating Plan If you have a gallbladder condition, you may have trouble digesting fats. Eating a low-fat diet can help reduce your symptoms, and may be helpful before and after having surgery to remove your gallbladder (cholecystectomy). Your health care provider may recommend that you work with a diet and nutrition specialist (dietitian) to help you reduce the amount of fat in your diet. What are tips for following this plan? General guidelines  Limit your fat intake to less than 30% of your total daily calories. If you eat around 1,800 calories each day, this is less than 60 grams (g) of fat per day.  Fat is an important part of a healthy diet. Eating a low-fat diet can make it hard to maintain a healthy body weight. Ask your dietitian how much fat, calories, and other nutrients you need each day.  Eat small, frequent meals throughout the day instead of three large meals.  Drink at least 8-10 cups of fluid a day. Drink enough fluid to keep your urine clear or pale yellow.  Limit alcohol intake to no more than 1 drink a day for nonpregnant women and 2 drinks a day for men. One drink equals 12 oz of beer, 5 oz of wine, or 1 oz of hard liquor. Reading food labels  Check Nutrition Facts on food labels for the amount of fat per serving. Choose foods with less than 3 grams of fat per serving. Shopping  Choose nonfat and low-fat healthy foods. Look for the words "nonfat," "low fat," or "fat free."  Avoid buying processed or prepackaged foods. Cooking  Cook using low-fat methods, such as baking, broiling, grilling, or boiling.  Cook with small amounts  of healthy fats, such as olive oil, grapeseed oil, canola oil, or sunflower oil. What foods are recommended?   All fresh, frozen, or canned fruits and vegetables.  Whole grains.  Low-fat or non-fat (skim) milk and yogurt.  Lean meat, skinless poultry, fish, eggs, and beans.  Low-fat protein supplement powders or drinks.  Spices and herbs. What foods are not recommended?  High-fat foods. These include baked goods, fast food, fatty cuts of meat, ice cream, french toast, sweet rolls, pizza, cheese bread, foods covered with butter, creamy sauces, or cheese.  Fried foods. These include french fries, tempura, battered fish, breaded chicken, fried breads, and sweets.  Foods with strong odors.  Foods that cause bloating and gas. Summary  A low-fat diet can be helpful if you have a gallbladder condition, or before and after gallbladder surgery.  Limit your fat intake to less than 30% of your total daily calories. This is about 60 g of fat if you eat 1,800 calories each day.  Eat small, frequent meals throughout the day instead of three large meals. This information is not intended to replace advice given to you by your health care provider. Make sure you discuss any questions you have with your health care provider. Document Revised: 11/22/2018 Document Reviewed: 09/08/2016 Elsevier Patient Education  2020 [employer].   Cholelithiasis  Cholelithiasis is also called "gallstones." It is a kind of gallbladder disease. The gallbladder is an organ that stores a liquid (bile) that helps  you digest fat. Gallstones may not cause symptoms (may be silent gallstones) until they cause a blockage, and then they can cause pain (gallbladder attack). Follow these instructions at home:  Take over-the-counter and prescription medicines only as told by your doctor.  Stay at a healthy weight.  Eat healthy foods. This includes: ? Eating fewer fatty foods, like fried foods. ? Eating fewer refined  carbs (refined carbohydrates). Refined carbs are breads and grains that are highly processed, like white bread and white rice. Instead, choose whole grains like whole-wheat bread and brown rice. ? Eating more fiber. Almonds, fresh fruit, and beans are healthy sources of fiber.  Keep all follow-up visits as told by your doctor. This is important. Contact a doctor if:  You have sudden pain in the upper right side of your belly (abdomen). Pain might spread to your right shoulder or your chest. This may be a sign of a gallbladder attack.  You feel sick to your stomach (are nauseous).  You throw up (vomit).  You have been diagnosed with gallstones that have no symptoms and you get: ? Belly pain. ? Discomfort, burning, or fullness in the upper part of your belly (indigestion). Get help right away if:  You have sudden pain in the upper right side of your belly, and it lasts for more than 2 hours.  You have belly pain that lasts for more than 5 hours.  You have a fever or chills.  You keep feeling sick to your stomach or you keep throwing up.  Your skin or the whites of your eyes turn yellow (jaundice).  You have dark-colored pee (urine).  You have light-colored poop (stool). Summary  Cholelithiasis is also called "gallstones."  The gallbladder is an organ that stores a liquid (bile) that helps you digest fat.  Silent gallstones are gallstones that do not cause symptoms.  A gallbladder attack may cause sudden pain in the upper right side of your belly. Pain might spread to your right shoulder or your chest. If this happens, contact your doctor.  If you have sudden pain in the upper right side of your belly that lasts for more than 2 hours, get help right away. This information is not intended to replace advice given to you by your health care provider. Make sure you discuss any questions you have with your health care provider. Document Revised: 07/14/2017 Document Reviewed:  04/17/2016 Elsevier Patient Education  [place].   Cirrhosis  Cirrhosis is long-term (chronic) liver injury. The liver is the body's largest internal organ, and it performs many functions. It converts food into energy, removes toxic material from the blood, makes important proteins, and absorbs necessary vitamins from food. In cirrhosis, healthy liver cells are replaced by scar tissue. This prevents blood from flowing through the liver, making it difficult for the liver to function. Scarring of the liver cannot be reversed, but treatment can prevent it from getting worse. What are the causes? Common causes of this condition are hepatitis C and long-term alcohol abuse. Other causes include:  Nonalcoholic fatty liver disease. This happens when fat is deposited in the liver by causes other than alcohol.  Hepatitis B infection.  Autoimmune hepatitis. In this condition, the body's defense system (immune system) mistakenly attacks the liver cells, causing irritation and swelling (inflammation).  Diseases that cause blockage of ducts inside the liver.  Inherited liver diseases, such as hemochromatosis. This is one of the most common inherited liver diseases. In this disease, deposits of iron  collect in the liver and other organs.  Reactions to certain long-term medicines, such as amiodarone, a heart medicine.  Parasitic infections. These include schistosomiasis, which is caused by a flatworm.  Long-term contact to certain toxins. These toxins include certain organic solvents, such as toluene and chloroform. What increases the risk? You are more likely to develop this condition if:  You have certain types of viral hepatitis.  You abuse alcohol, especially if you are male.  You are overweight.  You share needles.  You have unprotected sex with someone who has viral hepatitis. What are the signs or symptoms? You may not have any signs and symptoms at first. Symptoms may not  develop until the damage to your liver starts to get worse. Early symptoms may include:  Weakness and tiredness (fatigue).  Changes in sleep patterns or having trouble sleeping.  Itchiness.  Tenderness in the right-upper part of your abdomen.  Weight loss and muscle loss.  Nausea.  Loss of appetite.  Appearance of tiny blood vessels under the skin. Later symptoms may include:  Fatigue or weakness that is getting worse.  Yellow skin and eyes (jaundice).  Buildup of fluid in the abdomen (ascites). You may notice that your clothes are tight around your waist.  Weight gain.  Swelling of the feet and ankles (edema).  Trouble breathing.  Easy bruising and bleeding.  Vomiting blood.  Black or bloody stool.  Mental confusion. How is this diagnosed? Your health care provider may suspect cirrhosis based on your symptoms and medical history, especially if you have other medical conditions or a history of alcohol abuse. Your health care provider will do a physical exam to feel your liver and to check for signs of cirrhosis. He or she may perform other tests, including:  Blood tests to check: ? For hepatitis B or C. ? Kidney function. ? Liver function.  Imaging tests such as: ? MRI or CT scan to look for changes seen in advanced cirrhosis. ? Ultrasound to see if normal liver tissue is being replaced by scar tissue.  A procedure in which a long needle is used to take a sample of liver tissue to be checked in a lab (biopsy). Liver biopsy can confirm the diagnosis of cirrhosis. How is this treated? Treatment for this condition depends on how damaged your liver is and what caused the damage. It may include treating the symptoms of cirrhosis, or treating the underlying causes in order to slow the damage. Treatment may include:  Making lifestyle changes, such as: ? Eating a healthy diet. You may need to work with your health care provider or a diet and nutrition specialist  (dietitian) to develop an eating plan. ? Restricting salt intake. ? Maintaining a healthy weight. ? Not abusing drugs or alcohol.  Taking medicines to: ? Treat liver infections or other infections. ? Control itching. ? Reduce fluid buildup. ? Reduce certain blood toxins. ? Reduce risk of bleeding from enlarged blood vessels in the stomach or esophagus (varices).  Liver transplant. In this procedure, a liver from a donor is used to replace your diseased liver. This is done if cirrhosis has caused liver failure. Other treatments and procedures may be done depending on the problems that you get from cirrhosis. Common problems include liver-related kidney failure (hepatorenal syndrome). Follow these instructions at home:   Take medicines only as told by your health care provider. Do not use medicines that are toxic to your liver. Ask your health care provider before taking any  new medicines, including over-the-counter medicines.  Rest as needed.  Eat a well-balanced diet. Ask your health care provider or dietitian for more information.  Limit your salt or water intake, if your health care provider asks you to do this.  Do not drink alcohol. This is especially important if you are taking acetaminophen.  Keep all follow-up visits as told by your health care provider. This is important. Contact a health care provider if you:  Have fatigue or weakness that is getting worse.  Develop swelling of the hands, feet, legs, or face.  Have a fever.  Develop loss of appetite.  Have nausea or vomiting.  Develop jaundice.  Develop easy bruising or bleeding. Get help right away if you:  Vomit bright red blood or a material that looks like coffee grounds.  Have blood in your stools.  Notice that your stools appear black and tarry.  Become confused.  Have chest pain or trouble breathing. Summary  Cirrhosis is chronic liver injury. Liver damage cannot be reversed. Common causes are  hepatitis C and long-term alcohol abuse.  Tests used to diagnose cirrhosis include blood tests, imaging tests, and liver biopsy.  Treatment for this condition involves treating the underlying cause. Avoid alcohol, drugs, salt, and medicines that may damage your liver.  Contact your health care provider if you develop ascites, edema, jaundice, fever, nausea or vomiting, easy bruising or bleeding, or worsening fatigue. This information is not intended to replace advice given to you by your health care provider. Make sure you discuss any questions you have with your health care provider. Document Revised: 11/21/2018 Document Reviewed: 06/21/2017 Elsevier Patient Education  [place].    Laparoscopic Cholecystectomy Laparoscopic cholecystectomy is surgery to remove the gallbladder. The gallbladder is a pear-shaped organ that lies beneath the liver on the right side of the body. The gallbladder stores bile, which is a fluid that helps the body to digest fats. Cholecystectomy is often done for inflammation of the gallbladder (cholecystitis). This condition is usually caused by a buildup of gallstones (cholelithiasis) in the gallbladder. Gallstones can block the flow of bile, which can result in inflammation and pain. In severe cases, emergency surgery may be required. This procedure is done though small incisions in your abdomen (laparoscopic surgery). A thin scope with a camera (laparoscope) is inserted through one incision. Thin surgical instruments are inserted through the other incisions. In some cases, a laparoscopic procedure may be turned into a type of surgery that is done through a larger incision (open surgery). Tell a health care provider about:  Any allergies you have.  All medicines you are taking, including vitamins, herbs, eye drops, creams, and over-the-counter medicines.  Any problems you or family members have had with anesthetic medicines.  Any blood disorders you  have.  Any surgeries you have had.  Any medical conditions you have.  Whether you are pregnant or may be pregnant. What are the risks? Generally, this is a safe procedure. However, problems may occur, including:  Infection.  Bleeding.  Allergic reactions to medicines.  Damage to other structures or organs.  A stone remaining in the common bile duct. The common bile duct carries bile from the gallbladder into the small intestine.  A bile leak from the cyst duct that is clipped when your gallbladder is removed. Medicines  Ask your health care provider about: ? Changing or stopping your regular medicines. This is especially important if you are taking diabetes medicines or blood thinners. ? Taking medicines such  as aspirin and ibuprofen. These medicines can thin your blood. Do not take these medicines before your procedure if your health care provider instructs you not to.  You may be given antibiotic medicine to help prevent infection. General instructions  Let your health care provider know if you develop a cold or an infection before surgery.  Plan to have someone take you home from the hospital or clinic.  Ask your health care provider how your surgical site will be marked or identified. What happens during the procedure?   To reduce your risk of infection: ? Your health care team will wash or sanitize their hands. ? Your skin will be washed with soap. ? Hair may be removed from the surgical area.  An IV tube may be inserted into one of your veins.  You will be given one or more of the following: ? A medicine to help you relax (sedative). ? A medicine to make you fall asleep (general anesthetic).  A breathing tube will be placed in your mouth.  Your surgeon will make several small cuts (incisions) in your abdomen.  The laparoscope will be inserted through one of the small incisions. The camera on the laparoscope will send images to a TV screen (monitor) in the  operating room. This lets your surgeon see inside your abdomen.  Air-like gas will be pumped into your abdomen. This will expand your abdomen to give the surgeon more room to perform the surgery.  Other tools that are needed for the procedure will be inserted through the other incisions. The gallbladder will be removed through one of the incisions.  Your common bile duct may be examined. If stones are found in the common bile duct, they may be removed.  After your gallbladder has been removed, the incisions will be closed with stitches (sutures), staples, or skin glue.  Your incisions may be covered with a bandage (dressing). The procedure may vary among health care providers and hospitals. What happens after the procedure?  Your blood pressure, heart rate, breathing rate, and blood oxygen level will be monitored until the medicines you were given have worn off.  You will be given medicines as needed to control your pain.  Do not drive for 24 hours if you were given a sedative. This information is not intended to replace advice given to you by your health care provider. Make sure you discuss any questions you have with your health care provider. Document Revised: 07/14/2017 Document Reviewed: 01/18/2016 Elsevier Patient Education  [place].

## 2019-12-17 NOTE — Progress Notes (Signed)
Cardiology Office Note  Date: 12/18/2019   ID: Alexander Parks, DOB 09-08-1950, MRN [id number]  PCP:  Manon Hilding, MD  Cardiologist:  Carlyle Dolly, MD Electrophysiologist:  None   Chief Complaint: Follow-up chronic systolic HF/ICM/CAD, HLD  History of Present Illness: Alexander Parks is a 68 y.o. male with a history of chronic systolic HF/ICM/CAD ([id number] CABG x 5 (LIMA-LAD, SVG-diag, SVG-OM, SVg-PDA and PL sequential), HLD. Echo 2019 EF 60-65% No WMA's ICM improved.  Last OV 09/13/2019 doing well without symptoms. EF improved after CABG. PCP labs. Continuing statins.  He presents today with no particular complaints.  He denies any anginal or exertional symptoms, palpitations or arrhythmias, orthostatic symptoms, PND or orthopnea, lower extremity edema, syncopal or near syncopal episodes.  He is pending cholecystectomy for gallstones with Dr. Constance Haw.  He recently had screening labs at her office.  He is here for surgical clearance.   Past Medical History:  Diagnosis Date  . Allergy   . Diabetes mellitus without complication [facility])     Past Surgical History:  Procedure Laterality Date  . BALLOON DILATION  08/21/2012   Procedure: BALLOON DILATION;  Surgeon: Marissa Nestle, MD;  Location: [facility];  Service: Urology;  Laterality: Right;  Balloon Dilation Right Ureter  . CORONARY ARTERY BYPASS GRAFT N/A 02/08/2018   Procedure: CORONARY ARTERY BYPASS GRAFT times five, LIMA-LAD, SVG-DIAG, SVG-OM, SVG-PD-PL(SEQ), using left internal mammary artery and Endoharvest of Right greater saphenous vein;  Surgeon: Grace Isaac, MD;  Location: [place];  Service: Open Heart Surgery;  Laterality: N/A;  . CYSTOSCOPY W/ URETERAL STENT PLACEMENT  08/21/2012   Procedure: CYSTOSCOPY WITH RETROGRADE PYELOGRAM/URETERAL STENT PLACEMENT;  Surgeon: Marissa Nestle, MD;  [facility];  Service: Urology;  Laterality: Right;  Cystoscopy with Right Retrograde Pyelogram, Right Ureteral Stent Placement   . HERNIA REPAIR    . HOLMIUM LASER APPLICATION  [id number]   Procedure: HOLMIUM LASER APPLICATION;  Surgeon: Marissa Nestle, MD;  [facility];  Service: Urology;  Laterality: Right;  Holmium Laser Application to Right Ureteral Calculus  . INTRAOPERATIVE TRANSESOPHAGEAL ECHOCARDIOGRAM N/A 02/08/2018   Procedure: INTRAOPERATIVE TRANSESOPHAGEAL ECHOCARDIOGRAM;  Surgeon: Grace Isaac, MD;  Location: [facility] OR;  Service: Open Heart Surgery;  Laterality: N/A;  . RIGHT/LEFT HEART CATH AND CORONARY ANGIOGRAPHY N/A 02/07/2018   Procedure: RIGHT/LEFT HEART CATH AND CORONARY ANGIOGRAPHY;  Surgeon: Burnell Blanks, MD;  Location: [place] [facility];  Service: Cardiovascular;  Laterality: N/A;  . STONE EXTRACTION WITH BASKET  08/21/2012   Procedure: STONE EXTRACTION WITH BASKET;  Surgeon: Marissa Nestle, MD;  [facility];  Service: Urology;  Laterality: Right;  Right Ureteral Stone Basket Extraction    Current Outpatient Medications  Medication Sig Dispense Refill  . acetaminophen (TYLENOL) 325 MG tablet Take 2 tablets (650 mg total) by mouth every 6 (six) hours as needed for mild pain.    Marland Kitchen aspirin EC 81 MG tablet Take 81 mg by mouth daily.    Marland Kitchen atorvastatin (LIPITOR) 80 MG tablet Take 1 tablet (80 mg total) by mouth daily at 6 PM. 30 tablet 1  . carvedilol (COREG) 3.125 MG tablet TAKE 1 TABLET BY MOUTH TWICE DAILY WITH A MEAL 180 tablet 0  . furosemide (LASIX) 40 MG tablet Take 1 tablet (40 mg total) by mouth daily. 30 tablet 0  . lisinopril (ZESTRIL) 2.5 MG tablet Take 1 tablet by mouth once daily 30 tablet 6  . metFORMIN (GLUCOPHAGE-XR) 500 MG 24  hr tablet Take 500 mg by mouth 2 (two) times daily.     Marland Kitchen triamcinolone cream (KENALOG) 0.1 % SMARTSIG:1 Application Topical 2-3 Times Daily     No current facility-administered medications for this visit.   Allergies:  Benadryl [diphenhydramine hcl], Iodinated diagnostic agents, and Morphine and related   Social History: The patient   reports that he quit smoking about 23 years ago. His smoking use included cigarettes. He has a 30.00 pack-year smoking history. He has never used smokeless tobacco. He reports that he does not drink alcohol or use drugs.   Family History: The patient's family history includes Cancer in his mother and sister; Liver disease in his brother.   ROS:  Please see the history of present illness. Otherwise, complete review of systems is positive for none.  All other systems are reviewed and negative.   Physical Exam: VS:  BP 120/75   Pulse 74   Ht 6\' 2"  (1.88 m)   Wt 229 lb 3.2 oz (104 kg)   SpO2 97%   BMI 29.43 kg/m , BMI Body mass index is 29.43 kg/m.  Wt Readings from Last 3 Encounters:  12/18/19 229 lb 3.2 oz (104 kg)  12/17/19 227 lb (103 kg)  09/13/19 228 lb 3.2 oz (103.5 kg)    General: Patient appears comfortable at rest. Neck: Supple, no elevated JVP or carotid bruits, no thyromegaly. Lungs: Clear to auscultation, nonlabored breathing at rest. Cardiac: Regular rate and rhythm, no S3 or significant systolic murmur, no pericardial rub. Extremities: No pitting edema, distal pulses 2+. Skin: Warm and dry.  Healing median sternotomy scar from bypass surgery. Musculoskeletal: No kyphosis. Neuropsychiatric: Alert and oriented x3, affect grossly appropriate.  ECG:  EKG 09/13/2019 shows sinus rhythm normal first-degree AV block, right bundle branch block rate of 75.  Recent Labwork: 12/17/2019: ALT 41; AST 30; BUN 25; Creatinine, Ser 1.19; Hemoglobin 12.6; Platelets 141; Potassium 4.3; Sodium 137  No results found for: CHOL, TRIG, HDL, CHOLHDL, VLDL, LDLCALC, LDLDIRECT  Other Studies Reviewed Today: Diagnostic Studies  12/2017 echo Study Conclusions  - Left ventricle: The cavity size was normal. Wall thickness was normal. Systolic function was moderately reduced. The estimated ejection fraction was in the range of 35% to 40%. Diffuse hypokinesis. The study is not technically  sufficient to allow evaluation of LV diastolic function. - Aortic valve: Mildly calcified annulus. Trileaflet; mildly thickened leaflets. There was mild regurgitation. Valve area (VTI): 2.49 cm^2. Valve area (Vmax): 2.43 cm^2. Valve area (Vmean): 2.09 cm^2. - Mitral valve: There was mild to moderate regurgitation. - Left atrium: The atrium was moderately dilated. - Right ventricle: The cavity size was mildly to moderately dilated. Systolic function was mildly reduced. - Right atrium: The atrium was mildly dilated. - Pulmonary arteries: Systolic pressure was mildly increased. PA peak pressure: 34 mm Hg (S). - Pericardium, extracardiac: There is a left pleural effusion.  6/2019cath  Prox RCA lesion is 99% stenosed.  Post Atrio lesion is 99% stenosed.  Ost RPDA lesion is 50% stenosed.  Mid RCA lesion is 20% stenosed.  Ost LM to Mid LM lesion is 90% stenosed.  Mid LM to Dist LM lesion is 60% stenosed.  Prox Cx to Mid Cx lesion is 99% stenosed.  Prox LAD lesion is 99% stenosed.  Ost 1st Diag lesion is 80% stenosed.  Ost LAD to Prox LAD lesion is 30% stenosed.  1. Severe triple vessel CAD  2. Severe ostial left main stenosis 3. Severe stenosis mid LAD and in  the small to moderate caliber diagonal branch 4. Severe stenosis in the mid Circumflex leading into the large obtuse marginal branch 5. Severe stenosis in the proximal segment of the large dominant RCA. Severe stenosis in the posterolateral branch.  6. LVEF 35-40% by echo  Post cathRecommendations: Will admit to telemetry unit and consult CT surgery for CABG. He is having no chest pain or dyspnea. Will start ASA and high intensity statin. Continue beta blocker, Ace-inh. Will start IV heparin 6 hours post sheath pull.   01/2018 Carotid US 1-39% bilateral disease   01/2018 ABI Normal bilateral  06/2018 echo Study Conclusions  - Left ventricle: The cavity size was normal. Wall thickness  was increased in a pattern of mild LVH. Systolic function was normal. The estimated ejection fraction was in the range of 60% to 65%. Wall motion was normal; there were no regional wall motion abnormalities. Indeterminate diastolic function. - Aortic valve: Trileaflet; moderately calcified leaflets. - Mitral valve: Mildly calcified annulus. - Right atrium: Central venous pressure (est): 3 mm Hg. - Atrial septum: No defect or patent foramen ovale was identified. - Tricuspid valve: There was trivial regurgitation. - Pulmonary arteries: PA peak pressure: 11 mm Hg (S). - Pericardium, extracardiac: There was no pericardial effusion.  Impressions:  - LVEF has normalized in comparison to prior study from May.   Assessment and Plan:  1. Preoperative clearance   2. CAD in native artery   3. Mixed hyperlipidemia   4. Chronic systolic heart failure ([place])    1. Preoperative clearance Patient has a pending cholecystectomy for gallstones with Dr. Constance Haw.  He is here for preop clearance.  He recently had screening lab work for pending surgery.  Patient's revised cardiac risk index = 2 which puts him at a 6.6% perioperative risk of major cardiac event.  2. CAD in native artery 02/08/18 CABG x 5 (LIMA-LAD, SVG-diag, SVG-OM, SVg-PDA and PL sequential.)  He denies any anginal or exertional symptoms.  He continues to work driving a truck.  Continue aspirin 81 mg, carvedilol 3.125 mg.   3. Mixed hyperlipidemia Recent lab work on 08/23/2019 lipid profile showing TC 94, TG 99, HDL 37, LDL 38.  Continue atorvastatin 80 mg p.o. daily  4.  Ischemic cardiomyopathy/CHF Patient's EF improved on most recent echo after bypass surgery.  Current EF is 60 to 65% status post CABG.  Continue lisinopril 2.5 mg, Lasix 40 mg daily.   Medication Adjustments/Labs and Tests Ordered: Current medicines are reviewed at length with the patient today.  Concerns regarding medicines are outlined above.    Disposition: Follow-up with Dr Harl Bowie or [facility] 1 year.  Signed, Levell July, NP 12/18/2019 9:49 AM    [facility] [place], [place], [place] [address] Phone: [phone]; Fax: [phone]

## 2019-12-17 NOTE — Progress Notes (Signed)
[facility] History and Physical  Reason for Referral: Gallbladder  Referring Physician:  Dr. Quintin Alto   Chief Complaint    New Patient (Initial Visit)      Alexander Parks is a 69 y.o. male.  HPI: Alexander Parks is a 69 yo with a history of CAD s/p stents and CABG 2019 who comes in with complaints of some RUQ pain that came on after eating some fried fish. This was associated with some nausea and vomiting and he felt very bloated. He says that he has had a similar episode in the past, but that this was more intense than prior. He has had some relief and no other episodes for the past few days and complains of no pain at this time. 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  He says he has no chest pain or SOB.  He had a CT chest done to assess for lung nodules due to a history of smoking and was found to have gallstones on this test.  He also is reported to have a cirrhotic appearing liver on the CT.    He reports that in 1984 he stopped drinking but prior to that he would drink 1/2-1 galloon of liquor a week. He is now completely abstient. He says he had a brother that died for cirrhosis of the liver that he thinks was related to tylenol. He has never had anyone tell him he had some degree of cirrhosis based on his imaging.   Past Medical History:  Diagnosis Date  . Allergy   . Diabetes mellitus without complication [facility])     Past Surgical History:  Procedure Laterality Date  . BALLOON DILATION  08/21/2012   Procedure: BALLOON DILATION;  Surgeon: Marissa Nestle, MD;  [facility];  Service: Urology;  Laterality: Right;  Balloon Dilation Right Ureter  . CORONARY ARTERY BYPASS GRAFT N/A 02/08/2018   Procedure: CORONARY ARTERY BYPASS GRAFT times five, LIMA-LAD, SVG-DIAG, SVG-OM, SVG-PD-PL(SEQ), using left internal mammary artery and Endoharvest of Right greater saphenous vein;  Surgeon: Grace Isaac, MD;  Location: [place];  Service:  Open Heart Surgery;  Laterality: N/A;  . CYSTOSCOPY W/ URETERAL STENT PLACEMENT  08/21/2012   Procedure: CYSTOSCOPY WITH RETROGRADE PYELOGRAM/URETERAL STENT PLACEMENT;  Surgeon: Marissa Nestle, MD;  [facility];  Service: Urology;  Laterality: Right;  Cystoscopy with Right Retrograde Pyelogram, Right Ureteral Stent Placement  . HERNIA REPAIR    . HOLMIUM LASER APPLICATION  [id number]   Procedure: HOLMIUM LASER APPLICATION;  Surgeon: Marissa Nestle, MD;  [facility];  Service: Urology;  Laterality: Right;  Holmium Laser Application to Right Ureteral Calculus  . INTRAOPERATIVE TRANSESOPHAGEAL ECHOCARDIOGRAM N/A 02/08/2018   Procedure: INTRAOPERATIVE TRANSESOPHAGEAL ECHOCARDIOGRAM;  Surgeon: Grace Isaac, MD;  Location: [facility] OR;  Service: Open Heart Surgery;  Laterality: N/A;  . RIGHT/LEFT HEART CATH AND CORONARY ANGIOGRAPHY N/A 02/07/2018   Procedure: RIGHT/LEFT HEART CATH AND CORONARY ANGIOGRAPHY;  Surgeon: Burnell Blanks, MD;  Location: [place] [facility];  Service: Cardiovascular;  Laterality: N/A;  . STONE EXTRACTION WITH BASKET  08/21/2012   Procedure: STONE EXTRACTION WITH BASKET;  Surgeon: Marissa Nestle, MD;  [facility];  Service: Urology;  Laterality: Right;  Right Ureteral Stone Basket Extraction    Family History  Problem Relation Age of Onset  . Cancer Mother   . Cancer Sister   . Liver disease Brother  Social History   Tobacco Use  . Smoking status: Former Smoker    Packs/day: 2.00    Years: 15.00    Pack years: 30.00    Types: Cigarettes    Quit date: 08/16/1996    Years since quitting: 23.3  . Smokeless tobacco: Never Used  Substance Use Topics  . Alcohol use: No  . Drug use: No    Medications: I have reviewed the patient's current medications. Allergies as of 12/17/2019      Reactions   Benadryl [diphenhydramine Hcl] Swelling   Iodinated Diagnostic Agents Other (See Comments)   Unknown   Morphine And Related Other (See Comments)    Unknown      Medication List       Accurate as of Dec 17, 2019 11:59 PM. If you have any questions, ask your nurse or doctor.        acetaminophen 325 MG tablet Commonly known as: TYLENOL Take 2 tablets (650 mg total) by mouth every 6 (six) hours as needed for mild pain.   aspirin EC 81 MG tablet Take 81 mg by mouth daily.   atorvastatin 80 MG tablet Commonly known as: LIPITOR Take 1 tablet (80 mg total) by mouth daily at 6 PM.   carvedilol 3.125 MG tablet Commonly known as: COREG TAKE 1 TABLET BY MOUTH TWICE DAILY WITH A MEAL   furosemide 40 MG tablet Commonly known as: LASIX Take 1 tablet (40 mg total) by mouth daily.   lisinopril 2.5 MG tablet Commonly known as: ZESTRIL Take 1 tablet by mouth once daily   metFORMIN 500 MG 24 hr tablet Commonly known as: GLUCOPHAGE-XR Take 500 mg by mouth 2 (two) times daily.   triamcinolone cream 0.1 % Commonly known as: KENALOG SMARTSIG:1 Application Topical 2-3 Times Daily        ROS:  A comprehensive review of systems was negative except for: Respiratory: positive for cough Gastrointestinal: positive for abdominal pain, nausea, reflux symptoms and vomiting Endocrine: positive for diabetes  Blood pressure 122/76, pulse 75, temperature (!) 97.4 F (36.3 C), temperature source Oral, resp. rate 14, height 6\' 2"  (1.88 m), weight 227 lb (103 kg), SpO2 93 %. Physical Exam Vitals reviewed.  Constitutional:      Appearance: Normal appearance.  HENT:     Head: Normocephalic and atraumatic.     Nose: Nose normal.     Mouth/Throat:     Mouth: Mucous membranes are moist.  Eyes:     Extraocular Movements: Extraocular movements intact.     Pupils: Pupils are equal, round, and reactive to light.  Cardiovascular:     Rate and Rhythm: Normal rate and regular rhythm.  Pulmonary:     Effort: Pulmonary effort is normal.     Breath sounds: Normal breath sounds.  Abdominal:     General: There is no distension.     Palpations:  Abdomen is soft.     Tenderness: There is no abdominal tenderness.  Musculoskeletal:        General: Normal range of motion.     Cervical back: Normal range of motion.  Skin:    General: Skin is warm and dry.  Neurological:     General: No focal deficit present.     Mental Status: He is alert and oriented to person, place, and time.  Psychiatric:        Mood and Affect: Mood normal.        Behavior: Behavior normal.  Thought Content: Thought content normal.        Judgment: Judgment normal.     Results: CT chest 2021, CT a/p angio 2019 both reviewed- gallbladder with some calcified stones, nodular contour of liver, no obvious dilated gastric or esophageal vessels   Assessment & Plan:  Alexander Parks is a 69 y.o. 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 He is definitely have gallbladder attacks, and could be passing gallstones. He has never had any imaging of the gallbladder formally, and he also has not had liver test in over a year. I am unsure to the degree of cirrhosis, but he also needs to be followed for this and will likely need a biopsy at the time of surgery. I discussed with him given his CABG and cardiac history getting cardiology to see him to risk stratify him. I also discussed getting labs to look at the liver and INR, and to get an [place] to assess the gallbladder further. Will also refer to Dr. Laural Golden to follow for the cirrhosis given the brothers history and his imaging findings.    -[place] RUQ to assess gallbladder further and the liver  -CBC, CMP, INR  -Will get cardiology to risk stratify -Will get Dr. Laural Golden to see for cirrhosis -Laparoscopic cholecystectomy and liver biopsy in the near future  PLAN: I counseled the patient about the indication, risks and benefits of laparoscopic cholecystectomy.  He understands there is a very small chance for bleeding, infection, injury to normal structures (including common bile duct), conversion to open surgery,  persistent symptoms, evolution of postcholecystectomy diarrhea, need for secondary interventions, anesthesia reaction, cardiopulmonary issues and other risks not specifically detailed here. I described the expected recovery, the plan for follow-up and the restrictions during the recovery phase.  All questions were answered.   Future Appointments  Date Time Provider [place]  12/27/2019 10:30 AM AP-US 2 [facility]-US Deneise Lever [facility]  01/16/2020 10:30 AM Virl Cagey, MD RS-RS None  03/19/2020  8:45 AM Minus [place], PA-C NRE-NRE None    All questions were answered to the satisfaction of the patient and family.   Virl Cagey 12/19/2019, 12:00 PM

## 2019-12-18 ENCOUNTER — Encounter (INDEPENDENT_AMBULATORY_CARE_PROVIDER_SITE_OTHER): Payer: Self-pay | Admitting: Gastroenterology

## 2019-12-18 ENCOUNTER — Ambulatory Visit (INDEPENDENT_AMBULATORY_CARE_PROVIDER_SITE_OTHER): Payer: Medicare HMO | Admitting: Family Medicine

## 2019-12-18 ENCOUNTER — Encounter: Payer: Self-pay | Admitting: Family Medicine

## 2019-12-18 VITALS — BP 120/75 | HR 74 | Ht 74.0 in | Wt 229.2 lb

## 2019-12-18 DIAGNOSIS — E782 Mixed hyperlipidemia: Secondary | ICD-10-CM

## 2019-12-18 DIAGNOSIS — I2510 Atherosclerotic heart disease of native coronary artery without angina pectoris: Secondary | ICD-10-CM | POA: Diagnosis not present

## 2019-12-18 DIAGNOSIS — I5022 Chronic systolic (congestive) heart failure: Secondary | ICD-10-CM | POA: Diagnosis not present

## 2019-12-18 DIAGNOSIS — Z01818 Encounter for other preprocedural examination: Secondary | ICD-10-CM | POA: Diagnosis not present

## 2019-12-18 NOTE — Patient Instructions (Addendum)

## 2019-12-18 NOTE — Addendum Note (Signed)
Addended by: Merlene Laughter on: 12/18/2019 03:34 PM   Modules accepted: Orders

## 2019-12-19 ENCOUNTER — Other Ambulatory Visit: Payer: Self-pay | Admitting: Cardiology

## 2019-12-23 ENCOUNTER — Ambulatory Visit (HOSPITAL_COMMUNITY): Payer: Medicare HMO

## 2019-12-27 ENCOUNTER — Ambulatory Visit (HOSPITAL_COMMUNITY)
Admission: RE | Admit: 2019-12-27 | Discharge: 2019-12-27 | Disposition: A | Discharge status: Home / Self Care | Payer: Medicare HMO | Source: Ambulatory Visit | Attending: General Surgery | Admitting: General Surgery

## 2019-12-27 ENCOUNTER — Other Ambulatory Visit: Payer: Self-pay

## 2019-12-27 DIAGNOSIS — K8020 Calculus of gallbladder without cholecystitis without obstruction: Secondary | ICD-10-CM

## 2019-12-27 DIAGNOSIS — K7460 Unspecified cirrhosis of liver: Secondary | ICD-10-CM

## 2020-01-02 ENCOUNTER — Encounter (INDEPENDENT_AMBULATORY_CARE_PROVIDER_SITE_OTHER): Payer: Self-pay | Admitting: *Deleted

## 2020-01-02 ENCOUNTER — Ambulatory Visit (INDEPENDENT_AMBULATORY_CARE_PROVIDER_SITE_OTHER): Payer: Medicare HMO | Admitting: Internal Medicine

## 2020-01-02 ENCOUNTER — Other Ambulatory Visit: Payer: Self-pay

## 2020-01-02 ENCOUNTER — Encounter (INDEPENDENT_AMBULATORY_CARE_PROVIDER_SITE_OTHER): Payer: Self-pay | Admitting: Internal Medicine

## 2020-01-02 VITALS — BP 109/69 | HR 80 | Temp 97.2°F | Ht 74.0 in | Wt 224.7 lb

## 2020-01-02 DIAGNOSIS — K7460 Unspecified cirrhosis of liver: Secondary | ICD-10-CM

## 2020-01-02 NOTE — H&P (View-Only) (Signed)
Reason for consultation  Cirrhosis.  History of present illness  Patient is 69 year old Caucasian male who has been referred through courtesy of Dr. Blake Divine for GI evaluation.  Patient was referred to Dr. Blake Divine for cholecystectomy for symptomatic cholelithiasis by Dr. Consuello Masse.  Dr. Constance Haw noted that patient has cirrhosis and recommended further evaluation as well as screening colonoscopy prior to cholecystectomy. History is provided by the patient and his wife [name]. Patient goes by Alexander Parks. Patient's wife states that they first found out that he has cirrhosis when he was symptomatic with urolithiasis.  However he has not undergone any testing to look for cause of his cirrhosis.  He used to drink on weekends but he has not had any alcohol since 1984.  There is no history of elevated transaminases or jaundice in the past.  No history of drug use.  He states he has been diabetic since 2005.  He has been having intermittent postprandial episodes of nausea vomiting chest and upper abdominal pain.  Along with the symptoms he would also have heartburn.  He was found to have cholelithiasis the cause of his symptoms.  He states he has very good appetite.  He has not lost any weight recently.  However he did lose 44 pounds in 2 to 3 months following CABG in June 2019.  His bowels move daily.  He denies melena or rectal bleeding.  He has never been screened for colorectal carcinoma despite recommendation by Dr. Quintin Alto.  Now he is interested in pursuing the screening colonoscopy.   Current Medications: Outpatient Encounter Medications as of 01/02/2020  Medication Sig  . aspirin EC 81 MG tablet Take 81 mg by mouth daily.  Marland Kitchen atorvastatin (LIPITOR) 80 MG tablet Take 1 tablet (80 mg total) by mouth daily at 6 PM.  . carvedilol (COREG) 3.125 MG tablet TAKE 1 TABLET BY MOUTH TWICE DAILY WITH A MEAL  . furosemide (LASIX) 40 MG tablet Take 1 tablet (40 mg total) by mouth daily.  Marland Kitchen lisinopril  (ZESTRIL) 2.5 MG tablet Take 1 tablet by mouth once daily  . metFORMIN (GLUCOPHAGE-XR) 500 MG 24 hr tablet Take 500 mg by mouth 2 (two) times daily.   Marland Kitchen SHINGRIX injection Inject 0.5 mLs into the muscle once.   . triamcinolone cream (KENALOG) 0.1 % SMARTSIG:1 Application Topical 2-3 Times Daily   No facility-administered encounter medications on file as of 01/02/2020.   Past medical history  Past Surgical History:  Procedure Laterality Date  . BALLOON DILATION  08/21/2012   Procedure: BALLOON DILATION;  Surgeon: Marissa Nestle, MD;  Location: [facility];  Service: Urology;  Laterality: Right;  Balloon Dilation Right Ureter  . CORONARY ARTERY BYPASS GRAFT N/A 02/08/2018   Procedure: CORONARY ARTERY BYPASS GRAFT times five, LIMA-LAD, SVG-DIAG, SVG-OM, SVG-PD-PL(SEQ), using left internal mammary artery and Endoharvest of Right greater saphenous vein;  Surgeon: Grace Isaac, MD;  Location: [facility];  Service: Open Heart Surgery;  Laterality: N/A;  . CYSTOSCOPY W/ URETERAL STENT PLACEMENT  08/21/2012   Procedure: CYSTOSCOPY WITH RETROGRADE PYELOGRAM/URETERAL STENT PLACEMENT;  Surgeon: Marissa Nestle, MD;  Location: [facility];  Service: Urology;  Laterality: Right;  Cystoscopy with Right Retrograde Pyelogram, Right Ureteral Stent Placement  . HERNIA REPAIR    . HOLMIUM LASER APPLICATION  10/18/1441   Procedure: HOLMIUM LASER APPLICATION;  Surgeon: Marissa Nestle, MD;  Location: [facility];  Service: Urology;  Laterality: Right;  Holmium Laser Application to Right Ureteral Calculus  . INTRAOPERATIVE TRANSESOPHAGEAL ECHOCARDIOGRAM  N/A 02/08/2018   Procedure: INTRAOPERATIVE TRANSESOPHAGEAL ECHOCARDIOGRAM;  Surgeon: Grace Isaac, MD;  Location: [facility];  Service: Open Heart Surgery;  Laterality: N/A;  . RIGHT/LEFT HEART CATH AND CORONARY ANGIOGRAPHY N/A 02/07/2018   Procedure: RIGHT/LEFT HEART CATH AND CORONARY ANGIOGRAPHY;  Surgeon: Burnell Blanks, MD;  Location: [facility];  Service:  Cardiovascular;  Laterality: N/A;  . STONE EXTRACTION WITH BASKET  08/21/2012   Procedure: STONE EXTRACTION WITH BASKET;  Surgeon: Marissa Nestle, MD;  Location: [facility];  Service: Urology;  Laterality: Right;  Right Ureteral Stone Basket Extraction   Past Surgical History:  Procedure Laterality Date  . BALLOON DILATION  08/21/2012   Procedure: BALLOON DILATION;  Surgeon: Marissa Nestle, MD;  [facility]: [facility];  Service: Urology;  Laterality: Right;  Balloon Dilation Right Ureter  . CORONARY ARTERY BYPASS GRAFT N/A 02/08/2018   Procedure: CORONARY ARTERY BYPASS GRAFT times five, LIMA-LAD, SVG-DIAG, SVG-OM, SVG-PD-PL(SEQ), using left internal mammary artery and Endoharvest of Right greater saphenous vein;  Surgeon: Grace Isaac, MD;  Location: [facility];  Service: Open Heart Surgery;  Laterality: N/A;  . CYSTOSCOPY W/ URETERAL STENT PLACEMENT  08/21/2012   Procedure: CYSTOSCOPY WITH RETROGRADE PYELOGRAM/URETERAL STENT PLACEMENT;  Surgeon: Marissa Nestle, MD;  [facility]: [facility];  Service: Urology;  Laterality: Right;  Cystoscopy with Right Retrograde Pyelogram, Right Ureteral Stent Placement  . HERNIA REPAIR    . HOLMIUM LASER APPLICATION  6/0/6301   Procedure: HOLMIUM LASER APPLICATION;  Surgeon: Marissa Nestle, MD;  [facility]: [facility];  Service: Urology;  Laterality: Right;  Holmium Laser Application to Right Ureteral Calculus  . INTRAOPERATIVE TRANSESOPHAGEAL ECHOCARDIOGRAM N/A 02/08/2018   Procedure: INTRAOPERATIVE TRANSESOPHAGEAL ECHOCARDIOGRAM;  Surgeon: Grace Isaac, MD;  Location: [facility];  Service: Open Heart Surgery;  Laterality: N/A;  . RIGHT/LEFT HEART CATH AND CORONARY ANGIOGRAPHY N/A 02/07/2018   Procedure: RIGHT/LEFT HEART CATH AND CORONARY ANGIOGRAPHY;  Surgeon: Burnell Blanks, MD;  Location: [facility];  Service: Cardiovascular;  Laterality: N/A;  . STONE EXTRACTION WITH BASKET  08/21/2012   Procedure: STONE EXTRACTION WITH BASKET;  Surgeon: Marissa Nestle,  MD;  Location: [facility];  Service: Urology;  Laterality: Right;  Right Ureteral Stone Basket Extraction     Allergies  Allergies  Allergen Reactions  . Benadryl [Diphenhydramine Hcl] Swelling  . Iodinated Diagnostic Agents Other (See Comments)    Unknown  . Morphine And Related Other (See Comments)    Unknown    Family history  Father died of rare brain condition at age 74.  Mother lived to be in her 36s. 1 brother died of cirrhosis at age 65.  He has a brother age 23 in good health.  He has 3 sisters.  One is in good health and to have health issues.  He lost fourth sister of lung carcinoma at age 54.  She had mental retardation.  Social history  Patient is married(second marriage).  He has 2 sons from his first marriage ages 86 and 80.  Older son is in good health but the younger son has had issues with drug use and presently is in jail.  He does not have any children from second marriage.  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  He smokes cigarettes but a pack a day for 15 years but quit over 20 years ago.  He has not had any alcohol since 1984.  Prior that he would  drink on weekends.Physical examination  Blood pressure 109/69, pulse 80, temperature (!) 97.2 F (36.2 C), temperature source Temporal, height _0  (1.88 m), weight 224 lb 11.2 oz (101.9 kg). Patient is alert and in no acute distress. He is wearing facial mask. Conjunctiva is pink. Sclera is nonicteric Oropharyngeal mucosa is normal. He has upper and lower dentures in place. No neck masses or thyromegaly noted. He has midsternal scar. Cardiac exam with regular rhythm normal S1 and S2. No murmur or gallop noted. Lungs are clear to auscultation. Abdomen abdomen is full.  He has small umbilical hernia.  It is completely reducible.  He also has hypopigmented areas in lower mid abdomen resulting from recent bout with herpes zoster.  Bowel sounds are normal.  On palpation abdomen is soft  and nontender without hepatosplenomegaly. No LE edema or clubbing noted.  Labs/studies Results:  CBC Latest Ref Rng & Units 12/17/2019 03/13/2018 02/13/2018  WBC 4.0 - 10.5 K/uL 8.0 11.2(H) 9.6  Hemoglobin 13.0 - 17.0 g/dL 12.6(L) 12.1(L) 9.2(L)  Hematocrit 39.0 - 52.0 % 39.7 36.6(L) 28.1(L)  Platelets 150 - 400 K/uL 141(L) 176 102(L)    CMP Latest Ref Rng & Units 12/17/2019 03/13/2018 02/12/2018  Glucose 70 - 99 mg/dL 141(H) 172(H) 139(H)  BUN 8 - 23 mg/dL 25(H) 23 23  Creatinine 0.61 - 1.24 mg/dL 1.19 1.00 0.95  Sodium 135 - 145 mmol/L 137 135 137  Potassium 3.5 - 5.1 mmol/L 4.3 4.1 4.5  Chloride 98 - 111 mmol/L 101 97(L) 106  CO2 22 - 32 mmol/L _1 Calcium 8.9 - 10.3 mg/dL 9.6 9.8 7.9(L)  Total Protein 6.5 - 8.1 g/dL 7.8 - -  Total Bilirubin 0.3 - 1.2 mg/dL 1.7(H) - -  Alkaline Phos 38 - 126 U/L 86 - -  AST 15 - 41 U/L 30 - -  ALT 0 - 44 U/L 41 - -    Hepatic Function Latest Ref Rng & Units 12/17/2019 02/07/2018  Total Protein 6.5 - 8.1 g/dL 7.8 7.4  Albumin 3.5 - 5.0 g/dL 3.9 3.5  AST 15 - 41 U/L 30 27  ALT 0 - 44 U/L 41 26  Alk Phosphatase 38 - 126 U/L 86 54  Total Bilirubin 0.3 - 1.2 mg/dL 1.7(H) 1.3(H)  Bilirubin, Direct 0.0 - 0.2 mg/dL 0.2 -    INR was 1.1 on 12/17/2019.  Assessment:  #1.  Cirrhosis.  Patient apparently was diagnosed with cirrhosis over 10 years ago but he has not been formally evaluated.  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  His meld score is 11 and child Pugh score is 5.  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  Etiology is felt to be fatty liver disease given several year history of diabetes mellitus.  Other causes need to be ruled out.  He has mild thrombocytopenia.  He therefore should also be screened for varices.  My index of suspicion that he has varices is low however. He will also undergo a liver biopsy at the time of cholecystectomy. He should also consider hepatitis a and B  vaccination unless lab studies revealed immunity.  #2.  Symptomatic cholelithiasis.  I agree with plans for laparoscopic cholecystectomy.  His perioperative risk is low given low meld  and child Pugh scores  #3.  Patient is average risk for CRC.  He has been reluctant to be screened until now.  Recommendations  Patient will go to the lab for hepatitis B surface  antigen, hepatitis C virus antibody, serum iron TIBC ferritin ANA, smooth muscle antibody mitochondrial antibody ceruloplasmin and alpha-1 antitrypsin.  We will also check alpha-fetoprotein. Esophagogastroduodenoscopy to screen for esophageal varices and average risk screening colonoscopy is scheduled under monitored anesthesia care near future. He will have liver biopsy at the time of laparoscopic cholecystectomy. Office visit in 6 months.

## 2020-01-02 NOTE — Patient Instructions (Signed)
Physician will call you to results of blood test when completed. Esophagogastroduodenoscopy and colonoscopy to be scheduled.

## 2020-01-02 NOTE — Progress Notes (Signed)
Reason for consultation  Cirrhosis.  History of present illness  Patient is 69 year old Caucasian male who has been referred through courtesy of Dr. Blake Divine for GI evaluation.  Patient was referred to Dr. Blake Divine for cholecystectomy for symptomatic cholelithiasis by Dr. Consuello Masse.  Dr. Constance Haw noted that patient has cirrhosis and recommended further evaluation as well as screening colonoscopy prior to cholecystectomy. History is provided by the patient and his wife Alexander Parks. Patient goes by Alexander Parks. Patient's wife states that they first found out that he has cirrhosis when he was symptomatic with urolithiasis.  However he has not undergone any testing to look for cause of his cirrhosis.  He used to drink on weekends but he has not had any alcohol since 1984.  There is no history of elevated transaminases or jaundice in the past.  No history of drug use.  He states he has been diabetic since 2005.  He has been having intermittent postprandial episodes of nausea vomiting chest and upper abdominal pain.  Along with the symptoms he would also have heartburn.  He was found to have cholelithiasis the cause of his symptoms.  He states he has very good appetite.  He has not lost any weight recently.  However he did lose 44 pounds in 2 to 3 months following CABG in June 2019.  His bowels move daily.  He denies melena or rectal bleeding.  He has never been screened for colorectal carcinoma despite recommendation by Dr. Quintin Alto.  Now he is interested in pursuing the screening colonoscopy.   Current Medications: Outpatient Encounter Medications as of 01/02/2020  Medication Sig  . aspirin EC 81 MG tablet Take 81 mg by mouth daily.  Marland Kitchen atorvastatin (LIPITOR) 80 MG tablet Take 1 tablet (80 mg total) by mouth daily at 6 PM.  . carvedilol (COREG) 3.125 MG tablet TAKE 1 TABLET BY MOUTH TWICE DAILY WITH A MEAL  . furosemide (LASIX) 40 MG tablet Take 1 tablet (40 mg total) by mouth daily.  Marland Kitchen lisinopril  (ZESTRIL) 2.5 MG tablet Take 1 tablet by mouth once daily  . metFORMIN (GLUCOPHAGE-XR) 500 MG 24 hr tablet Take 500 mg by mouth 2 (two) times daily.   Marland Kitchen SHINGRIX injection Inject 0.5 mLs into the muscle once.   . triamcinolone cream (KENALOG) 0.1 % SMARTSIG:1 Application Topical 2-3 Times Daily   No facility-administered encounter medications on file as of 01/02/2020.   Past medical history  Past Surgical History:  Procedure Laterality Date  . BALLOON DILATION  08/21/2012   Procedure: BALLOON DILATION;  Surgeon: Marissa Nestle, MD;  Location: [facility];  Service: Urology;  Laterality: Right;  Balloon Dilation Right Ureter  . CORONARY ARTERY BYPASS GRAFT N/A 02/08/2018   Procedure: CORONARY ARTERY BYPASS GRAFT times five, LIMA-LAD, SVG-DIAG, SVG-OM, SVG-PD-PL(SEQ), using left internal mammary artery and Endoharvest of Right greater saphenous vein;  Surgeon: Grace Isaac, MD;  Location: [facility];  Service: Open Heart Surgery;  Laterality: N/A;  . CYSTOSCOPY W/ URETERAL STENT PLACEMENT  08/21/2012   Procedure: CYSTOSCOPY WITH RETROGRADE PYELOGRAM/URETERAL STENT PLACEMENT;  Surgeon: Marissa Nestle, MD;  Location: [facility];  Service: Urology;  Laterality: Right;  Cystoscopy with Right Retrograde Pyelogram, Right Ureteral Stent Placement  . HERNIA REPAIR    . HOLMIUM LASER APPLICATION  10/18/1441   Procedure: HOLMIUM LASER APPLICATION;  Surgeon: Marissa Nestle, MD;  Location: [facility];  Service: Urology;  Laterality: Right;  Holmium Laser Application to Right Ureteral Calculus  . INTRAOPERATIVE TRANSESOPHAGEAL ECHOCARDIOGRAM  N/A 02/08/2018   Procedure: INTRAOPERATIVE TRANSESOPHAGEAL ECHOCARDIOGRAM;  Surgeon: Grace Isaac, MD;  Location: [facility];  Service: Open Heart Surgery;  Laterality: N/A;  . RIGHT/LEFT HEART CATH AND CORONARY ANGIOGRAPHY N/A 02/07/2018   Procedure: RIGHT/LEFT HEART CATH AND CORONARY ANGIOGRAPHY;  Surgeon: Burnell Blanks, MD;  Location: [facility];  Service:  Cardiovascular;  Laterality: N/A;  . STONE EXTRACTION WITH BASKET  08/21/2012   Procedure: STONE EXTRACTION WITH BASKET;  Surgeon: Marissa Nestle, MD;  [facility]: [facility];  Service: Urology;  Laterality: Right;  Right Ureteral Stone Basket Extraction   Past Surgical History:  Procedure Laterality Date  . BALLOON DILATION  08/21/2012   Procedure: BALLOON DILATION;  Surgeon: Marissa Nestle, MD;  [facility]: [facility];  Service: Urology;  Laterality: Right;  Balloon Dilation Right Ureter  . CORONARY ARTERY BYPASS GRAFT N/A 02/08/2018   Procedure: CORONARY ARTERY BYPASS GRAFT times five, LIMA-LAD, SVG-DIAG, SVG-OM, SVG-PD-PL(SEQ), using left internal mammary artery and Endoharvest of Right greater saphenous vein;  Surgeon: Grace Isaac, MD;  Location: [facility];  Service: Open Heart Surgery;  Laterality: N/A;  . CYSTOSCOPY W/ URETERAL STENT PLACEMENT  08/21/2012   Procedure: CYSTOSCOPY WITH RETROGRADE PYELOGRAM/URETERAL STENT PLACEMENT;  Surgeon: Marissa Nestle, MD;  [facility]: [facility];  Service: Urology;  Laterality: Right;  Cystoscopy with Right Retrograde Pyelogram, Right Ureteral Stent Placement  . HERNIA REPAIR    . HOLMIUM LASER APPLICATION  6/0/6301   Procedure: HOLMIUM LASER APPLICATION;  Surgeon: Marissa Nestle, MD;  Location: [facility];  Service: Urology;  Laterality: Right;  Holmium Laser Application to Right Ureteral Calculus  . INTRAOPERATIVE TRANSESOPHAGEAL ECHOCARDIOGRAM N/A 02/08/2018   Procedure: INTRAOPERATIVE TRANSESOPHAGEAL ECHOCARDIOGRAM;  Surgeon: Grace Isaac, MD;  Location: [facility];  Service: Open Heart Surgery;  Laterality: N/A;  . RIGHT/LEFT HEART CATH AND CORONARY ANGIOGRAPHY N/A 02/07/2018   Procedure: RIGHT/LEFT HEART CATH AND CORONARY ANGIOGRAPHY;  Surgeon: Burnell Blanks, MD;  Location: [facility];  Service: Cardiovascular;  Laterality: N/A;  . STONE EXTRACTION WITH BASKET  08/21/2012   Procedure: STONE EXTRACTION WITH BASKET;  Surgeon: Marissa Nestle,  MD;  Location: [facility];  Service: Urology;  Laterality: Right;  Right Ureteral Stone Basket Extraction     Allergies  Allergies  Allergen Reactions  . Benadryl [Diphenhydramine Hcl] Swelling  . Iodinated Diagnostic Agents Other (See Comments)    Unknown  . Morphine And Related Other (See Comments)    Unknown    Family history  Father died of rare brain condition at age 74.  Mother lived to be in her 36s. 1 brother died of cirrhosis at age 65.  He has a brother age 23 in good health.  He has 3 sisters.  One is in good health and to have health issues.  He lost fourth sister of lung carcinoma at age 54.  She had mental retardation.  Social history  Patient is married(second marriage).  He has 2 sons from his first marriage ages 86 and 80.  Older son is in good health but the younger son has had issues with drug use and presently is in jail.  He does not have any children from second marriage.  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  He smokes cigarettes but a pack a day for 15 years but quit over 20 years ago.  He has not had any alcohol since 1984.  Prior that he would  drink on weekends.Physical examination  Blood pressure 109/69, pulse 80, temperature (!) 97.2 F (36.2 C), temperature source Temporal, height _0  (1.88 m), weight 224 lb 11.2 oz (101.9 kg). Patient is alert and in no acute distress. He is wearing facial mask. Conjunctiva is pink. Sclera is nonicteric Oropharyngeal mucosa is normal. He has upper and lower dentures in place. No neck masses or thyromegaly noted. He has midsternal scar. Cardiac exam with regular rhythm normal S1 and S2. No murmur or gallop noted. Lungs are clear to auscultation. Abdomen abdomen is full.  He has small umbilical hernia.  It is completely reducible.  He also has hypopigmented areas in lower mid abdomen resulting from recent bout with herpes zoster.  Bowel sounds are normal.  On palpation abdomen is soft  and nontender without hepatosplenomegaly. No LE edema or clubbing noted.  Labs/studies Results:  CBC Latest Ref Rng & Units 12/17/2019 03/13/2018 02/13/2018  WBC 4.0 - 10.5 K/uL 8.0 11.2(H) 9.6  Hemoglobin 13.0 - 17.0 g/dL 12.6(L) 12.1(L) 9.2(L)  Hematocrit 39.0 - 52.0 % 39.7 36.6(L) 28.1(L)  Platelets 150 - 400 K/uL 141(L) 176 102(L)    CMP Latest Ref Rng & Units 12/17/2019 03/13/2018 02/12/2018  Glucose 70 - 99 mg/dL 141(H) 172(H) 139(H)  BUN 8 - 23 mg/dL 25(H) 23 23  Creatinine 0.61 - 1.24 mg/dL 1.19 1.00 0.95  Sodium 135 - 145 mmol/L 137 135 137  Potassium 3.5 - 5.1 mmol/L 4.3 4.1 4.5  Chloride 98 - 111 mmol/L 101 97(L) 106  CO2 22 - 32 mmol/L _1 Calcium 8.9 - 10.3 mg/dL 9.6 9.8 7.9(L)  Total Protein 6.5 - 8.1 g/dL 7.8 - -  Total Bilirubin 0.3 - 1.2 mg/dL 1.7(H) - -  Alkaline Phos 38 - 126 U/L 86 - -  AST 15 - 41 U/L 30 - -  ALT 0 - 44 U/L 41 - -    Hepatic Function Latest Ref Rng & Units 12/17/2019 02/07/2018  Total Protein 6.5 - 8.1 g/dL 7.8 7.4  Albumin 3.5 - 5.0 g/dL 3.9 3.5  AST 15 - 41 U/L 30 27  ALT 0 - 44 U/L 41 26  Alk Phosphatase 38 - 126 U/L 86 54  Total Bilirubin 0.3 - 1.2 mg/dL 1.7(H) 1.3(H)  Bilirubin, Direct 0.0 - 0.2 mg/dL 0.2 -    INR was 1.1 on 12/17/2019.  Assessment:  #1.  Cirrhosis.  Patient apparently was diagnosed with cirrhosis over 10 years ago but he has not been formally evaluated.  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  His meld score is 11 and child Pugh score is 5.  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  Etiology is felt to be fatty liver disease given several year history of diabetes mellitus.  Other causes need to be ruled out.  He has mild thrombocytopenia.  He therefore should also be screened for varices.  My index of suspicion that he has varices is low however. He will also undergo a liver biopsy at the time of cholecystectomy. He should also consider hepatitis a and B  vaccination unless lab studies revealed immunity.  #2.  Symptomatic cholelithiasis.  I agree with plans for laparoscopic cholecystectomy.  His perioperative risk is low given low meld  and child Pugh scores  #3.  Patient is average risk for CRC.  He has been reluctant to be screened until now.  Recommendations  Patient will go to the lab for hepatitis B surface  antigen, hepatitis C virus antibody, serum iron TIBC ferritin ANA, smooth muscle antibody mitochondrial antibody ceruloplasmin and alpha-1 antitrypsin.  We will also check alpha-fetoprotein. Esophagogastroduodenoscopy to screen for esophageal varices and average risk screening colonoscopy is scheduled under monitored anesthesia care near future. He will have liver biopsy at the time of laparoscopic cholecystectomy. Office visit in 6 months.

## 2020-01-03 ENCOUNTER — Other Ambulatory Visit (INDEPENDENT_AMBULATORY_CARE_PROVIDER_SITE_OTHER): Payer: Self-pay | Admitting: *Deleted

## 2020-01-03 NOTE — Addendum Note (Signed)
Addended by: Rogene Houston on: 01/03/2020 08:06 AM   Modules accepted: Orders

## 2020-01-05 LAB — IRON, TOTAL/TOTAL IRON BINDING CAP
%SAT: 18 % (calc) — ABNORMAL LOW (ref 20–48)
Iron: 54 ug/dL (ref 50–180)
TIBC: 305 mcg/dL (calc) (ref 250–425)

## 2020-01-05 LAB — ANA: Anti Nuclear Antibody (ANA): NEGATIVE

## 2020-01-05 LAB — HEPATITIS C RNA QUANTITATIVE
HCV Quantitative Log: <1.18 Log IU/mL
HCV RNA, PCR, QN: <15 IU/mL

## 2020-01-05 LAB — AFP TUMOR MARKER: AFP-Tumor Marker: 4.4 ng/mL (ref <6.1)

## 2020-01-05 LAB — HEPATITIS B SURFACE ANTIGEN: Hepatitis B Surface Ag: NONREACTIVE

## 2020-01-05 LAB — MITOCHONDRIAL ANTIBODIES: Mitochondrial M2 Ab, IgG: <=20 U

## 2020-01-05 LAB — CERULOPLASMIN: Ceruloplasmin: 30 mg/dL (ref 18–36)

## 2020-01-05 LAB — FERRITIN: Ferritin: 149 ng/mL (ref 24–380)

## 2020-01-05 LAB — ALPHA-1-ANTITRYPSIN: A-1 Antitrypsin, Ser: 164 mg/dL (ref 83–199)

## 2020-01-05 LAB — ANTI-SMOOTH MUSCLE ANTIBODY, IGG: Actin (Smooth Muscle) Antibody (IGG): <20 U (ref <20)

## 2020-01-06 ENCOUNTER — Other Ambulatory Visit (INDEPENDENT_AMBULATORY_CARE_PROVIDER_SITE_OTHER): Payer: Self-pay | Admitting: *Deleted

## 2020-01-06 DIAGNOSIS — K7460 Unspecified cirrhosis of liver: Secondary | ICD-10-CM

## 2020-01-06 DIAGNOSIS — Z1211 Encounter for screening for malignant neoplasm of colon: Secondary | ICD-10-CM

## 2020-01-10 NOTE — Patient Instructions (Signed)
Alexander Parks  01/10/2020     @PREFPERIOPPHARMACY @   Your procedure is scheduled on  01/17/2020 .  Report to [facility] at  [phone]  A.M.  Call this number if you have problems the morning of surgery:  [phone]   Remember:  Follow the diet and prep instructions given to you by Dr Olevia Perches office.                     Take these medicines the morning of surgery with A SIP OF WATER  Carvedilol.    Do not wear jewelry, make-up or nail polish.  Do not wear lotions, powders, or perfumes. Please wear deodorant and brush your teeth.  Do not shave 48 hours prior to surgery.  Men may shave face and neck.  Do not bring valuables to the hospital.  [facility] is not responsible for any belongings or valuables.  Contacts, dentures or bridgework may not be worn into surgery.  Leave your suitcase in the car.  After surgery it may be brought to your room.  For patients admitted to the hospital, discharge time will be determined by your treatment team.  Patients discharged the day of surgery will not be allowed to drive home.   Name and phone number of your driver:   family Special instructions:  DO NOT smoke the morning of your procedure.  Please read over the following fact sheets that you were given. Anesthesia Post-op Instructions and Care and Recovery After Surgery       Upper Endoscopy, Adult, Care After This sheet gives you information about how to care for yourself after your procedure. Your health care provider may also give you more specific instructions. If you have problems or questions, contact your health care provider. What can I expect after the procedure? After the procedure, it is common to have:  A sore throat.  Mild stomach pain or discomfort.  Bloating.  Nausea. Follow these instructions at home:   Follow instructions from your health care provider about what to eat or drink after your procedure.  Return to your normal activities as told by your  health care provider. Ask your health care provider what activities are safe for you.  Take over-the-counter and prescription medicines only as told by your health care provider.  Do not drive for 24 hours if you were given a sedative during your procedure.  Keep all follow-up visits as told by your health care provider. This is important. Contact a health care provider if you have:  A sore throat that lasts longer than one day.  Trouble swallowing. Get help right away if:  You vomit blood or your vomit looks like coffee grounds.  You have: ? A fever. ? Bloody, black, or tarry stools. ? A severe sore throat or you cannot swallow. ? Difficulty breathing. ? Severe pain in your chest or abdomen. Summary  After the procedure, it is common to have a sore throat, mild stomach discomfort, bloating, and nausea.  Do not drive for 24 hours if you were given a sedative during the procedure.  Follow instructions from your health care provider about what to eat or drink after your procedure.  Return to your normal activities as told by your health care provider. This information is not intended to replace advice given to you by your health care provider. Make sure you discuss any questions you have with your health care provider. Document Revised: 01/23/2018 Document  Reviewed: 01/01/2018 Elsevier Patient Education  [place].  Colonoscopy, Adult, Care After This sheet gives you information about how to care for yourself after your procedure. Your health care provider may also give you more specific instructions. If you have problems or questions, contact your health care provider. What can I expect after the procedure? After the procedure, it is common to have:  A small amount of blood in your stool for 24 hours after the procedure.  Some gas.  Mild cramping or bloating of your abdomen. Follow these instructions at home: Eating and drinking   Drink enough fluid to keep  your urine pale yellow.  Follow instructions from your health care provider about eating or drinking restrictions.  Resume your normal diet as instructed by your health care provider. Avoid heavy or fried foods that are hard to digest. Activity  Rest as told by your health care provider.  Avoid sitting for a long time without moving. Get up to take short walks every 1-2 hours. This is important to improve blood flow and breathing. Ask for help if you feel weak or unsteady.  Return to your normal activities as told by your health care provider. Ask your health care provider what activities are safe for you. Managing cramping and bloating   Try walking around when you have cramps or feel bloated.  Apply heat to your abdomen as told by your health care provider. Use the heat source that your health care provider recommends, such as a moist heat pack or a heating pad. ? Place a towel between your skin and the heat source. ? Leave the heat on for 20-30 minutes. ? Remove the heat if your skin turns bright red. This is especially important if you are unable to feel pain, heat, or cold. You may have a greater risk of getting burned. General instructions  For the first 24 hours after the procedure: ? Do not drive or use machinery. ? Do not sign important documents. ? Do not drink alcohol. ? Do your regular daily activities at a slower pace than normal. ? Eat soft foods that are easy to digest.  Take over-the-counter and prescription medicines only as told by your health care provider.  Keep all follow-up visits as told by your health care provider. This is important. Contact a health care provider if:  You have blood in your stool 2-3 days after the procedure. Get help right away if you have:  More than a small spotting of blood in your stool.  Large blood clots in your stool.  Swelling of your abdomen.  Nausea or vomiting.  A fever.  Increasing pain in your abdomen that is not  relieved with medicine. Summary  After the procedure, it is common to have a small amount of blood in your stool. You may also have mild cramping and bloating of your abdomen.  For the first 24 hours after the procedure, do not drive or use machinery, sign important documents, or drink alcohol.  Get help right away if you have a lot of blood in your stool, nausea or vomiting, a fever, or increased pain in your abdomen. This information is not intended to replace advice given to you by your health care provider. Make sure you discuss any questions you have with your health care provider. Document Revised: 02/25/2019 Document Reviewed: 02/25/2019 Elsevier Patient Education  [place] After These instructions provide you with information about caring for yourself after your procedure.  Your health care provider may also give you more specific instructions. Your treatment has been planned according to current medical practices, but problems sometimes occur. Call your health care provider if you have any problems or questions after your procedure. What can I expect after the procedure? After your procedure, you may:  Feel sleepy for several hours.  Feel clumsy and have poor balance for several hours.  Feel forgetful about what happened after the procedure.  Have poor judgment for several hours.  Feel nauseous or vomit.  Have a sore throat if you had a breathing tube during the procedure. Follow these instructions at home: For at least 24 hours after the procedure:      Have a responsible adult stay with you. It is important to have someone help care for you until you are awake and alert.  Rest as needed.  Do not: ? Participate in activities in which you could fall or become injured. ? Drive. ? Use heavy machinery. ? Drink alcohol. ? Take sleeping pills or medicines that cause drowsiness. ? Make important decisions or sign legal  documents. ? Take care of children on your own. Eating and drinking  Follow the diet that is recommended by your health care provider.  If you vomit, drink water, juice, or soup when you can drink without vomiting.  Make sure you have little or no nausea before eating solid foods. General instructions  Take over-the-counter and prescription medicines only as told by your health care provider.  If you have sleep apnea, surgery and certain medicines can increase your risk for breathing problems. Follow instructions from your health care provider about wearing your sleep device: ? Anytime you are sleeping, including during daytime naps. ? While taking prescription pain medicines, sleeping medicines, or medicines that make you drowsy.  If you smoke, do not smoke without supervision.  Keep all follow-up visits as told by your health care provider. This is important. Contact a health care provider if:  You keep feeling nauseous or you keep vomiting.  You feel light-headed.  You develop a rash.  You have a fever. Get help right away if:  You have trouble breathing. Summary  For several hours after your procedure, you may feel sleepy and have poor judgment.  Have a responsible adult stay with you for at least 24 hours or until you are awake and alert. This information is not intended to replace advice given to you by your health care provider. Make sure you discuss any questions you have with your health care provider. Document Revised: 10/30/2017 Document Reviewed: 11/22/2015 Elsevier Patient Education  [place].

## 2020-01-13 DIAGNOSIS — I5021 Acute systolic (congestive) heart failure: Secondary | ICD-10-CM | POA: Diagnosis not present

## 2020-01-13 DIAGNOSIS — E7849 Other hyperlipidemia: Secondary | ICD-10-CM | POA: Diagnosis not present

## 2020-01-13 DIAGNOSIS — Z7984 Long term (current) use of oral hypoglycemic drugs: Secondary | ICD-10-CM | POA: Diagnosis not present

## 2020-01-13 DIAGNOSIS — E1165 Type 2 diabetes mellitus with hyperglycemia: Secondary | ICD-10-CM | POA: Diagnosis not present

## 2020-01-15 ENCOUNTER — Other Ambulatory Visit: Payer: Self-pay

## 2020-01-15 ENCOUNTER — Other Ambulatory Visit (HOSPITAL_COMMUNITY)
Admission: RE | Admit: 2020-01-15 | Discharge: 2020-01-15 | Disposition: A | Discharge status: Home / Self Care | Payer: Medicare HMO | Source: Ambulatory Visit | Attending: Internal Medicine | Admitting: Internal Medicine

## 2020-01-15 ENCOUNTER — Encounter (HOSPITAL_COMMUNITY)
Admission: RE | Admit: 2020-01-15 | Discharge: 2020-01-15 | Disposition: A | Discharge status: Home / Self Care | Payer: Medicare HMO | Source: Ambulatory Visit | Attending: Internal Medicine | Admitting: Internal Medicine

## 2020-01-15 DIAGNOSIS — Z01812 Encounter for preprocedural laboratory examination: Secondary | ICD-10-CM | POA: Diagnosis not present

## 2020-01-15 DIAGNOSIS — Z20822 Contact with and (suspected) exposure to covid-19: Secondary | ICD-10-CM | POA: Diagnosis not present

## 2020-01-15 LAB — BASIC METABOLIC PANEL
Anion gap: 9 (ref 5–15)
BUN: 35 mg/dL — ABNORMAL HIGH (ref 8–23)
CO2: 23 mmol/L (ref 22–32)
Calcium: 9.4 mg/dL (ref 8.9–10.3)
Chloride: 104 mmol/L (ref 98–111)
Creatinine, Ser: 1.32 mg/dL — ABNORMAL HIGH (ref 0.61–1.24)
GFR calc Af Amer: >60 mL/min (ref >60)
GFR calc non Af Amer: 55 mL/min — ABNORMAL LOW (ref >60)
Glucose, Bld: 168 mg/dL — ABNORMAL HIGH (ref 70–99)
Potassium: 4 mmol/L (ref 3.5–5.1)
Sodium: 136 mmol/L (ref 135–145)

## 2020-01-15 LAB — SARS CORONAVIRUS 2 (TAT 6-24 HRS): SARS Coronavirus 2: NEGATIVE

## 2020-01-16 ENCOUNTER — Ambulatory Visit: Payer: Medicare HMO | Admitting: General Surgery

## 2020-01-17 ENCOUNTER — Encounter (HOSPITAL_COMMUNITY)
Admission: RE | Disposition: A | Discharge status: Home / Self Care | Payer: Self-pay | Source: Home / Self Care | Attending: Internal Medicine

## 2020-01-17 ENCOUNTER — Ambulatory Visit (HOSPITAL_COMMUNITY): Payer: Medicare HMO | Admitting: Anesthesiology

## 2020-01-17 ENCOUNTER — Encounter (HOSPITAL_COMMUNITY): Payer: Self-pay | Admitting: Internal Medicine

## 2020-01-17 ENCOUNTER — Ambulatory Visit (HOSPITAL_COMMUNITY)
Admission: RE | Admit: 2020-01-17 | Discharge: 2020-01-17 | Disposition: A | Discharge status: Home / Self Care | Payer: Medicare HMO | Attending: Internal Medicine | Admitting: Internal Medicine

## 2020-01-17 DIAGNOSIS — K7460 Unspecified cirrhosis of liver: Secondary | ICD-10-CM | POA: Diagnosis not present

## 2020-01-17 DIAGNOSIS — K2950 Unspecified chronic gastritis without bleeding: Secondary | ICD-10-CM | POA: Diagnosis not present

## 2020-01-17 DIAGNOSIS — D122 Benign neoplasm of ascending colon: Secondary | ICD-10-CM | POA: Diagnosis not present

## 2020-01-17 DIAGNOSIS — Z7984 Long term (current) use of oral hypoglycemic drugs: Secondary | ICD-10-CM | POA: Insufficient documentation

## 2020-01-17 DIAGNOSIS — Z1211 Encounter for screening for malignant neoplasm of colon: Secondary | ICD-10-CM

## 2020-01-17 DIAGNOSIS — K644 Residual hemorrhoidal skin tags: Secondary | ICD-10-CM | POA: Insufficient documentation

## 2020-01-17 DIAGNOSIS — K3189 Other diseases of stomach and duodenum: Secondary | ICD-10-CM | POA: Insufficient documentation

## 2020-01-17 DIAGNOSIS — K922 Gastrointestinal hemorrhage, unspecified: Secondary | ICD-10-CM | POA: Diagnosis not present

## 2020-01-17 DIAGNOSIS — E119 Type 2 diabetes mellitus without complications: Secondary | ICD-10-CM | POA: Diagnosis not present

## 2020-01-17 DIAGNOSIS — Z79899 Other long term (current) drug therapy: Secondary | ICD-10-CM | POA: Insufficient documentation

## 2020-01-17 DIAGNOSIS — I2510 Atherosclerotic heart disease of native coronary artery without angina pectoris: Secondary | ICD-10-CM | POA: Diagnosis not present

## 2020-01-17 DIAGNOSIS — K228 Other specified diseases of esophagus: Secondary | ICD-10-CM | POA: Diagnosis not present

## 2020-01-17 DIAGNOSIS — Z951 Presence of aortocoronary bypass graft: Secondary | ICD-10-CM | POA: Insufficient documentation

## 2020-01-17 DIAGNOSIS — K766 Portal hypertension: Secondary | ICD-10-CM | POA: Insufficient documentation

## 2020-01-17 DIAGNOSIS — D696 Thrombocytopenia, unspecified: Secondary | ICD-10-CM | POA: Insufficient documentation

## 2020-01-17 DIAGNOSIS — D125 Benign neoplasm of sigmoid colon: Secondary | ICD-10-CM | POA: Insufficient documentation

## 2020-01-17 DIAGNOSIS — K2970 Gastritis, unspecified, without bleeding: Secondary | ICD-10-CM | POA: Diagnosis not present

## 2020-01-17 DIAGNOSIS — K6289 Other specified diseases of anus and rectum: Secondary | ICD-10-CM | POA: Diagnosis not present

## 2020-01-17 DIAGNOSIS — Z87891 Personal history of nicotine dependence: Secondary | ICD-10-CM | POA: Insufficient documentation

## 2020-01-17 DIAGNOSIS — D127 Benign neoplasm of rectosigmoid junction: Secondary | ICD-10-CM | POA: Diagnosis not present

## 2020-01-17 DIAGNOSIS — I509 Heart failure, unspecified: Secondary | ICD-10-CM | POA: Insufficient documentation

## 2020-01-17 DIAGNOSIS — I4510 Unspecified right bundle-branch block: Secondary | ICD-10-CM | POA: Diagnosis not present

## 2020-01-17 DIAGNOSIS — M1990 Unspecified osteoarthritis, unspecified site: Secondary | ICD-10-CM | POA: Insufficient documentation

## 2020-01-17 DIAGNOSIS — K760 Fatty (change of) liver, not elsewhere classified: Secondary | ICD-10-CM | POA: Diagnosis not present

## 2020-01-17 DIAGNOSIS — Z9049 Acquired absence of other specified parts of digestive tract: Secondary | ICD-10-CM | POA: Insufficient documentation

## 2020-01-17 DIAGNOSIS — Z7982 Long term (current) use of aspirin: Secondary | ICD-10-CM | POA: Insufficient documentation

## 2020-01-17 DIAGNOSIS — K8020 Calculus of gallbladder without cholecystitis without obstruction: Secondary | ICD-10-CM | POA: Insufficient documentation

## 2020-01-17 DIAGNOSIS — K635 Polyp of colon: Secondary | ICD-10-CM | POA: Diagnosis not present

## 2020-01-17 DIAGNOSIS — D123 Benign neoplasm of transverse colon: Secondary | ICD-10-CM | POA: Diagnosis not present

## 2020-01-17 DIAGNOSIS — K2901 Acute gastritis with bleeding: Secondary | ICD-10-CM

## 2020-01-17 DIAGNOSIS — I110 Hypertensive heart disease with heart failure: Secondary | ICD-10-CM | POA: Diagnosis not present

## 2020-01-17 HISTORY — PX: ESOPHAGOGASTRODUODENOSCOPY (EGD) WITH PROPOFOL: SHX5813

## 2020-01-17 HISTORY — PX: POLYPECTOMY: SHX5525

## 2020-01-17 HISTORY — PX: COLONOSCOPY WITH PROPOFOL: SHX5780

## 2020-01-17 HISTORY — PX: BIOPSY: SHX5522

## 2020-01-17 LAB — BASIC METABOLIC PANEL
Anion gap: 14 (ref 5–15)
BUN: 21 mg/dL (ref 8–23)
CO2: 19 mmol/L — ABNORMAL LOW (ref 22–32)
Calcium: 8.7 mg/dL — ABNORMAL LOW (ref 8.9–10.3)
Chloride: 104 mmol/L (ref 98–111)
Creatinine, Ser: 1 mg/dL (ref 0.61–1.24)
GFR calc Af Amer: >60 mL/min (ref >60)
GFR calc non Af Amer: >60 mL/min (ref >60)
Glucose, Bld: 164 mg/dL — ABNORMAL HIGH (ref 70–99)
Potassium: 4.1 mmol/L (ref 3.5–5.1)
Sodium: 137 mmol/L (ref 135–145)

## 2020-01-17 LAB — HEMOGLOBIN AND HEMATOCRIT, BLOOD
HCT: 35.5 % — ABNORMAL LOW (ref 39.0–52.0)
Hemoglobin: 11.5 g/dL — ABNORMAL LOW (ref 13.0–17.0)

## 2020-01-17 LAB — GLUCOSE, CAPILLARY: Glucose-Capillary: 160 mg/dL — ABNORMAL HIGH (ref 70–99)

## 2020-01-17 SURGERY — COLONOSCOPY WITH PROPOFOL
Anesthesia: General

## 2020-01-17 MED ORDER — GLYCOPYRROLATE 0.2 MG/ML IJ SOLN
0.2000 mg | Freq: Once | INTRAMUSCULAR | Status: AC
  Administered 2020-01-17: 0.2 mg via INTRAVENOUS

## 2020-01-17 MED ORDER — PROPOFOL 10 MG/ML IV BOLUS
INTRAVENOUS | Status: DC | PRN
  Administered 2020-01-17: 60 mg via INTRAVENOUS

## 2020-01-17 MED ORDER — CHLORHEXIDINE GLUCONATE CLOTH 2 % EX PADS: 6.0000 | MEDICATED_PAD | Freq: Once | CUTANEOUS | Status: DC

## 2020-01-17 MED ORDER — LACTATED RINGERS IV SOLN
Freq: Once | INTRAVENOUS | Status: AC
  Administered 2020-01-17: 1000 mL via INTRAVENOUS

## 2020-01-17 MED ORDER — LIDOCAINE VISCOUS HCL 2 % MT SOLN
15.0000 mL | Freq: Once | OROMUCOSAL | Status: AC
  Administered 2020-01-17: 15 mL via OROMUCOSAL

## 2020-01-17 MED ORDER — GLYCOPYRROLATE 0.2 MG/ML IJ SOLN
INTRAMUSCULAR | Status: AC
  Filled 2020-01-17: qty 1

## 2020-01-17 MED ORDER — PROPOFOL 500 MG/50ML IV EMUL
INTRAVENOUS | Status: DC | PRN
  Administered 2020-01-17: 150 ug/kg/min via INTRAVENOUS

## 2020-01-17 MED ORDER — PROPOFOL 10 MG/ML IV BOLUS
INTRAVENOUS | Status: AC
  Filled 2020-01-17: qty 60

## 2020-01-17 MED ORDER — LIDOCAINE VISCOUS HCL 2 % MT SOLN
OROMUCOSAL | Status: AC
  Filled 2020-01-17: qty 15

## 2020-01-17 MED ORDER — LACTATED RINGERS IV SOLN: INTRAVENOUS | Status: DC | PRN

## 2020-01-17 MED ORDER — PANTOPRAZOLE SODIUM 40 MG PO TBEC: 40.0000 mg | DELAYED_RELEASE_TABLET | Freq: Every day | ORAL | 1 refills | Status: AC

## 2020-01-17 MED ORDER — PANTOPRAZOLE SODIUM 40 MG IV SOLR
40.0000 mg | Freq: Once | INTRAVENOUS | Status: AC
  Administered 2020-01-17: 40 mg via INTRAVENOUS
  Filled 2020-01-17: qty 40

## 2020-01-17 NOTE — Anesthesia Preprocedure Evaluation (Signed)
Anesthesia Evaluation  Patient identified by MRN, date of birth, ID band Patient awake    Reviewed: Allergy & Precautions, NPO status , Patient's Chart, lab work & pertinent test results, reviewed documented beta blocker date and time   History of Anesthesia Complications Negative for: history of anesthetic complications  Airway Mallampati: II  TM Distance: >3 FB Neck ROM: Full    Dental  (+) Edentulous Upper, Edentulous Lower   Pulmonary pneumonia, resolved, former smoker,    Pulmonary exam normal breath sounds clear to auscultation       Cardiovascular hypertension, Pt. on medications and Pt. on home beta blockers + CAD, + CABG (2019) and +CHF  Normal cardiovascular exam Rhythm:Regular Rate:Normal  13-Mar-2018 10:45:24 [facility] ROUTINE RECORD Sinus rhythm Borderline prolonged PR interval Right bundle branch block   Neuro/Psych negative psych ROS   GI/Hepatic Bowel prep,(+) Cirrhosis       ,   Endo/Other  diabetes, Well Controlled, Type 2, Oral Hypoglycemic Agents  Renal/GU Renal InsufficiencyRenal disease  negative genitourinary   Musculoskeletal  (+) Arthritis , Osteoarthritis,    Abdominal   Peds  Hematology negative hematology ROS (+)   Anesthesia Other Findings   Reproductive/Obstetrics negative OB ROS                            Anesthesia Physical Anesthesia Plan  ASA: III  Anesthesia Plan: General   Post-op Pain Management:    Induction: Intravenous  PONV Risk Score and Plan: 2 and TIVA  Airway Management Planned: Nasal Cannula, Natural Airway and Simple Face Mask  Additional Equipment:   Intra-op Plan:   Post-operative Plan:   Informed Consent: I have reviewed the patients History and Physical, chart, labs and discussed the procedure including the risks, benefits and alternatives for the proposed anesthesia with the patient or authorized  representative who has indicated his/her understanding and acceptance.       Plan Discussed with: CRNA and Surgeon  Anesthesia Plan Comments:         Anesthesia Quick Evaluation

## 2020-01-17 NOTE — Anesthesia Postprocedure Evaluation (Signed)
Anesthesia Post Note  Patient: ELISON WORREL  Procedure(s) Performed: COLONOSCOPY WITH PROPOFOL (N/A ) ESOPHAGOGASTRODUODENOSCOPY (EGD) WITH PROPOFOL (N/A ) BIOPSY POLYPECTOMY  Patient location during evaluation: PACU Anesthesia Type: General Level of consciousness: awake, oriented, awake and alert and patient cooperative Pain management: satisfactory to patient Vital Signs Assessment: post-procedure vital signs reviewed and stable Respiratory status: spontaneous breathing, respiratory function stable and nonlabored ventilation Cardiovascular status: stable Postop Assessment: no apparent nausea or vomiting Anesthetic complications: no     Last Vitals:  Vitals:   01/17/20 0650  BP: 137/75  Resp: 18  Temp: (!) 36.4 C  SpO2: 96%    Last Pain:  Vitals:   01/17/20 0650  TempSrc: Oral  PainSc: 0-No pain                 Violia Knopf

## 2020-01-17 NOTE — Transfer of Care (Signed)
Immediate Anesthesia Transfer of Care Note  Patient: Alexander Parks  Procedure(s) Performed: COLONOSCOPY WITH PROPOFOL (N/A ) ESOPHAGOGASTRODUODENOSCOPY (EGD) WITH PROPOFOL (N/A ) BIOPSY POLYPECTOMY  Patient Location: PACU  Anesthesia Type:General  Level of Consciousness: awake, alert , oriented and patient cooperative  Airway & Oxygen Therapy: Patient Spontanous Breathing  Post-op Assessment: Report given to RN, Post -op Vital signs reviewed and stable and Patient moving all extremities X 4  Post vital signs: Reviewed and stable  Last Vitals:  Vitals Value Taken Time  BP    Temp    Pulse    Resp    SpO2      Last Pain:  Vitals:   01/17/20 0650  TempSrc: Oral  PainSc: 0-No pain      Patients Stated Pain Goal: 8 (74/08/14 4818)  Complications: No apparent anesthesia complications

## 2020-01-17 NOTE — Interval H&P Note (Signed)
Patient reports no new symptoms or complaints since he was seen in the office. Preprocedure lab studies are pertinent for rising BUN and serum creatinine.  Patient was advised 2 days ago to hold off furosemide. His examination is pertinent for trace edema around ankles. Will check metabolic 7 today. Patient is agreeable to proceed with esophagogastroduodenoscopy with esophageal variceal banding if varices are grade III or IV followed by screening colonoscopy.  History and Physical Interval Note:  01/17/2020 7:22 AM  Alexander Parks  has presented today for surgery, with the diagnosis of cirrhosis, screening.  The various methods of treatment have been discussed with the patient and family. After consideration of risks, benefits and other options for treatment, the patient has consented to  Procedure(s) with comments: COLONOSCOPY WITH PROPOFOL (N/A) - 730 ESOPHAGOGASTRODUODENOSCOPY (EGD) WITH PROPOFOL (N/A) as a surgical intervention.  The patient's history has been reviewed, patient examined, no change in status, stable for surgery.  I have reviewed the patient's chart and labs.  Questions were answered to the patient's satisfaction.     [employer]

## 2020-01-17 NOTE — Op Note (Signed)
[facility] Patient Name: Alexander Parks Procedure Date: 01/17/2020 7:28 AM MRN: [id number] Date of Birth: [birth year] Attending MD: Hildred Laser , MD CSN: [id number] Age: 69 Admit Type: Outpatient Procedure:                Upper GI endoscopy Indications:              Cirrhosis rule out esophageal varices Providers:                Hildred Laser, MD, Otis Peak B. Sharon Seller, RN, Randa Spike, Technician Referring MD:             Lanell Matar. Constance Haw and Manon Hilding, MD Medicines:                Propofol per Anesthesia Complications:            No immediate complications. Estimated Blood Loss:     Estimated blood loss was minimal. Procedure:                Pre-Anesthesia Assessment:                           - Prior to the procedure, a History and Physical                            was performed, and patient medications and                            allergies were reviewed. The patient's tolerance of                            previous anesthesia was also reviewed. The risks                            and benefits of the procedure and the sedation                            options and risks were discussed with the patient.                            All questions were answered, and informed consent                            was obtained. Prior Anticoagulants: The patient has                            taken no previous anticoagulant or antiplatelet                            agents except for aspirin. ASA Grade Assessment:                            III - A patient with severe systemic disease. After  reviewing the risks and benefits, the patient was                            deemed in satisfactory condition to undergo the                            procedure.                           After obtaining informed consent, the endoscope was                            passed under direct vision. Throughout the        procedure, the patient's blood pressure, pulse, and                            oxygen saturations were monitored continuously. The                            [id number]-H190 ([id number]) scope was introduced through the                            mouth, and advanced to the second part of duodenum.                            The upper GI endoscopy was accomplished without                            difficulty. The patient tolerated the procedure                            well. Scope In: 7:36:27 AM Scope Out: 7:51:03 AM Total Procedure Duration: 0 hours 14 minutes 36 seconds  Findings:      The hypopharynx was normal.      The examined esophagus was normal.      The Z-line was irregular and was found 40 cm from the incisors. Biopsies       were taken with a cold forceps for histology. The pathology specimen was       placed into Bottle Number 2.      Hematin (altered blood/coffee-ground-like material) was found in the       gastric body.      Patchy mild inflammation characterized by congestion (edema), erosions       and erythema was found in the gastric body and in the gastric antrum.       Biopsies were taken with a cold forceps for histology. The pathology       specimen was placed into Bottle Number 1.      Mild portal hypertensive gastropathy was found in the gastric fundus.      The duodenal bulb and second portion of the duodenum were normal. Impression:               - Normal hypopharynx.                           - Normal esophagus.                           -  Z-line irregular, 40 cm from the incisors.                            Biopsied.                           - Hematin (altered blood/coffee-ground-like                            material) in the gastric body.                           - Gastritis. Biopsied.                           - Portal hypertensive gastropathy.                           - Normal duodenal bulb and second portion of the                             duodenum.                           comment: suspect bleeding secondary to erosive                            gastritis. Moderate Sedation:      Per Anesthesia Care Recommendation:           - Patient has a contact number available for                            emergencies. The signs and symptoms of potential                            delayed complications were discussed with the                            patient. Return to normal activities tomorrow.                            Written discharge instructions were provided to the                            patient.                           - Resume previous diet today.                           - Continue present medications.                           - No aspirin, ibuprofen, naproxen, or other                            non-steroidal anti-inflammatory drugs for 4 days.                           -  Pantoprazole 40 mg IV and thereafter po qd.                           - Check H/H today.                           - Await pathology results. Procedure Code(s):        --- Professional ---                           [phone], Esophagogastroduodenoscopy, flexible,                            transoral; with biopsy, single or multiple Diagnosis Code(s):        --- Professional ---                           K22.8, Other specified diseases of esophagus                           K92.2, Gastrointestinal hemorrhage, unspecified                           K29.70, Gastritis, unspecified, without bleeding                           K76.6, Portal hypertension                           K31.89, Other diseases of stomach and duodenum                           K74.60, Unspecified cirrhosis of liver CPT copyright 2019 American Medical Association. All rights reserved. The codes documented in this report are preliminary and upon coder review may  be revised to meet current compliance requirements. Hildred Laser, MD Hildred Laser, MD 01/17/2020 8:35:21 AM This  report has been signed electronically. Number of Addenda: 0

## 2020-01-17 NOTE — Discharge Instructions (Signed)
Resume aspirin on 01/20/2020.  Do not take OTC NSAIDs such as Aleve Advil BC powder etc. Resume other medications as before. Pantoprazole 40 mg by mouth 30 minutes before breakfast daily. Resume usual diet. No driving for 24 hours. Physician will call with biopsy results.   Gastritis, Adult Gastritis is inflammation of the stomach. There are two kinds of gastritis:  Acute gastritis. This kind develops suddenly.  Chronic gastritis. This kind is much more common and lasts for a long time. Gastritis happens when the lining of the stomach becomes weak or gets damaged. Without treatment, gastritis can lead to stomach bleeding and ulcers. What are the causes? This condition may be caused by:  An infection.  Drinking too much alcohol.  Certain medicines. These include steroids, antibiotics, and some over-the-counter medicines, such as aspirin or ibuprofen.  Having too much acid in the stomach.  A disease of the intestines or stomach.  Stress.  An allergic reaction.  Crohn's disease.  Some cancer treatments (radiation). Sometimes the cause of this condition is not known. What are the signs or symptoms? Symptoms of this condition include:  Pain or a burning sensation in the upper abdomen.  Nausea.  Vomiting.  An uncomfortable feeling of fullness after eating.  Weight loss.  Bad breath.  Blood in your vomit or stools. In some cases, there are no symptoms. How is this diagnosed? This condition may be diagnosed with:  Your medical history and a description of your symptoms.  A physical exam.  Tests. These can include: ? Blood tests. ? Stool tests. ? A test in which a thin, flexible instrument with a light and a camera is passed down the esophagus and into the stomach (upper endoscopy). ? A test in which a sample of tissue is taken for testing (biopsy). How is this treated? This condition may be treated with medicines. The medicines that are used vary depending on  the cause of the gastritis:  If the condition is caused by a bacterial infection, you may be given antibiotic medicines.  If the condition is caused by too much acid in the stomach, you may be given medicines called H2 blockers, proton pump inhibitors, or antacids. Treatment may also involve stopping the use of certain medicines, such as aspirin, ibuprofen, or other NSAIDs. Follow these instructions at home: Medicines  Take over-the-counter and prescription medicines only as told by your health care provider.  If you were prescribed an antibiotic medicine, take it as told by your health care provider. Do not stop taking the antibiotic even if you start to feel better. Eating and drinking   Eat small, frequent meals instead of large meals.  Avoid foods and drinks that make your symptoms worse.  Drink enough fluid to keep your urine pale yellow. Alcohol use  Do not drink alcohol if: ? Your health care provider tells you not to drink. ? You are pregnant, may be pregnant, or are planning to become pregnant.  If you drink alcohol: ? Limit your use to:  0-1 drink a day for women.  0-2 drinks a day for men. ? Be aware of how much alcohol is in your drink. In the [place] one drink equals one 12 oz bottle of beer (355 mL), one 5 oz glass of wine (148 mL), or one 1 oz glass of hard liquor (44 mL). General instructions  Talk with your health care provider about ways to manage stress, such as getting regular exercise or practicing deep breathing, meditation, or yoga.  Do not use any products that contain nicotine or tobacco, such as cigarettes and e-cigarettes. If you need help quitting, ask your health care provider.  Keep all follow-up visits as told by your health care provider. This is important. Contact a health care provider if:  Your symptoms get worse.  Your symptoms return after treatment. Get help right away if:  You vomit blood or material that looks like coffee  grounds.  You have black or dark red stools.  You are unable to keep fluids down.  Your abdominal pain gets worse.  You have a fever.  You do not feel better after one week. Summary  Gastritis is inflammation of the lining of the stomach that can occur suddenly (acute) or develop slowly over time (chronic).  This condition is diagnosed with a medical history, a physical exam, or tests.  This condition may be treated with medicines to treat infection or medicines to reduce the amount of acid in your stomach.  Follow your health care provider's instructions about taking medicines, making changes to your diet, and knowing when to call for help. This information is not intended to replace advice given to you by your health care provider. Make sure you discuss any questions you have with your health care provider. Document Revised: 12/19/2017 Document Reviewed: 12/19/2017 Elsevier Patient Education  [place] After These instructions provide you with information about caring for yourself after your procedure. Your health care provider may also give you more specific instructions. Your treatment has been planned according to current medical practices, but problems sometimes occur. Call your health care provider if you have any problems or questions after your procedure. What can I expect after the procedure? After your procedure, you may:  Feel sleepy for several hours.  Feel clumsy and have poor balance for several hours.  Feel forgetful about what happened after the procedure.  Have poor judgment for several hours.  Feel nauseous or vomit.  Have a sore throat if you had a breathing tube during the procedure. Follow these instructions at home: For at least 24 hours after the procedure:      Have a responsible adult stay with you. It is important to have someone help care for you until you are awake and alert.  Rest as  needed.  Do not: ? Participate in activities in which you could fall or become injured. ? Drive. ? Use heavy machinery. ? Drink alcohol. ? Take sleeping pills or medicines that cause drowsiness. ? Make important decisions or sign legal documents. ? Take care of children on your own. Eating and drinking  Follow the diet that is recommended by your health care provider.  If you vomit, drink water, juice, or soup when you can drink without vomiting.  Make sure you have little or no nausea before eating solid foods. General instructions  Take over-the-counter and prescription medicines only as told by your health care provider.  If you have sleep apnea, surgery and certain medicines can increase your risk for breathing problems. Follow instructions from your health care provider about wearing your sleep device: ? Anytime you are sleeping, including during daytime naps. ? While taking prescription pain medicines, sleeping medicines, or medicines that make you drowsy.  If you smoke, do not smoke without supervision.  Keep all follow-up visits as told by your health care provider. This is important. Contact a health care provider if:  You keep feeling nauseous or you keep vomiting.  You feel light-headed.  You develop a rash.  You have a fever. Get help right away if:  You have trouble breathing. Summary  For several hours after your procedure, you may feel sleepy and have poor judgment.  Have a responsible adult stay with you for at least 24 hours or until you are awake and alert. This information is not intended to replace advice given to you by your health care provider. Make sure you discuss any questions you have with your health care provider. Document Revised: 10/30/2017 Document Reviewed: 11/22/2015 Elsevier Patient Education  [place].   Colonoscopy, Adult, Care After This sheet gives you information about how to care for yourself after your procedure.  Your doctor may also give you more specific instructions. If you have problems or questions, call your doctor. What can I expect after the procedure? After the procedure, it is common to have:  A small amount of blood in your poop (stool) for 24 hours.  Some gas.  Mild cramping or bloating in your belly (abdomen). Follow these instructions at home: Eating and drinking   Drink enough fluid to keep your pee (urine) pale yellow.  Follow instructions from your doctor about what you cannot eat or drink.  Return to your normal diet as told by your doctor. Avoid heavy or fried foods that are hard to digest. Activity  Rest as told by your doctor.  Do not sit for a long time without moving. Get up to take short walks every 1-2 hours. This is important. Ask for help if you feel weak or unsteady.  Return to your normal activities as told by your doctor. Ask your doctor what activities are safe for you. To help cramping and bloating:   Try walking around.  Put heat on your belly as told by your doctor. Use the heat source that your doctor recommends, such as a moist heat pack or a heating pad. ? Put a towel between your skin and the heat source. ? Leave the heat on for 20-30 minutes. ? Remove the heat if your skin turns bright red. This is very important if you are unable to feel pain, heat, or cold. You may have a greater risk of getting burned. General instructions  For the first 24 hours after the procedure: ? Do not drive or use machinery. ? Do not sign important documents. ? Do not drink alcohol. ? Do your daily activities more slowly than normal. ? Eat foods that are soft and easy to digest.  Take over-the-counter or prescription medicines only as told by your doctor.  Keep all follow-up visits as told by your doctor. This is important. Contact a doctor if:  You have blood in your poop 2-3 days after the procedure. Get help right away if:  You have more than a small  amount of blood in your poop.  You see large clumps of tissue (blood clots) in your poop.  Your belly is swollen.  You feel like you may vomit (nauseous).  You vomit.  You have a fever.  You have belly pain that gets worse, and medicine does not help your pain. Summary  After the procedure, it is common to have a small amount of blood in your poop. You may also have mild cramping and bloating in your belly.  For the first 24 hours after the procedure, do not drive or use machinery, do not sign important documents, and do not drink alcohol.  Get help right away if  you have a lot of blood in your poop, feel like you may vomit, have a fever, or have more belly pain. This information is not intended to replace advice given to you by your health care provider. Make sure you discuss any questions you have with your health care provider. Document Revised: 02/25/2019 Document Reviewed: 02/25/2019 Elsevier Patient Education  [place].  Upper Endoscopy, Adult, Care After This sheet gives you information about how to care for yourself after your procedure. Your health care provider may also give you more specific instructions. If you have problems or questions, contact your health care provider. What can I expect after the procedure? After the procedure, it is common to have:  A sore throat.  Mild stomach pain or discomfort.  Bloating.  Nausea. Follow these instructions at home:   Follow instructions from your health care provider about what to eat or drink after your procedure.  Return to your normal activities as told by your health care provider. Ask your health care provider what activities are safe for you.  Take over-the-counter and prescription medicines only as told by your health care provider.  Do not drive for 24 hours if you were given a sedative during your procedure.  Keep all follow-up visits as told by your health care provider. This is  important. Contact a health care provider if you have:  A sore throat that lasts longer than one day.  Trouble swallowing. Get help right away if:  You vomit blood or your vomit looks like coffee grounds.  You have: ? A fever. ? Bloody, black, or tarry stools. ? A severe sore throat or you cannot swallow. ? Difficulty breathing. ? Severe pain in your chest or abdomen. Summary  After the procedure, it is common to have a sore throat, mild stomach discomfort, bloating, and nausea.  Do not drive for 24 hours if you were given a sedative during the procedure.  Follow instructions from your health care provider about what to eat or drink after your procedure.  Return to your normal activities as told by your health care provider. This information is not intended to replace advice given to you by your health care provider. Make sure you discuss any questions you have with your health care provider. Document Revised: 01/23/2018 Document Reviewed: 01/01/2018 Elsevier Patient Education  [place].

## 2020-01-17 NOTE — Op Note (Signed)
[facility] Patient Name: Alexander Parks Procedure Date: 01/17/2020 7:53 AM MRN: [id number] Date of Birth: 1950-12-18 Attending MD: Hildred Laser , MD CSN: [id number] Age: 69 Admit Type: Outpatient Procedure:                Colonoscopy Indications:              Screening for colorectal malignant neoplasm Providers:                Hildred Laser, MD, Gwenlyn Fudge RN, RN, Randa Spike, Technician Referring MD:             Lanell Matar. Constance Haw and Manon Hilding, MD Medicines:                Propofol per Anesthesia Complications:            No immediate complications. Estimated Blood Loss:     Estimated blood loss was minimal. Procedure:                Pre-Anesthesia Assessment:                           - Prior to the procedure, a History and Physical                            was performed, and patient medications and                            allergies were reviewed. The patient's tolerance of                            previous anesthesia was also reviewed. The risks                            and benefits of the procedure and the sedation                            options and risks were discussed with the patient.                            All questions were answered, and informed consent                            was obtained. Prior Anticoagulants: The patient has                            taken no previous anticoagulant or antiplatelet                            agents except for aspirin. ASA Grade Assessment:                            III - A patient with severe systemic disease. After  reviewing the risks and benefits, the patient was                            deemed in satisfactory condition to undergo the                            procedure.                           - Prior to the procedure, a History and Physical                            was performed, and patient medications and   allergies were reviewed. The patient's tolerance of                            previous anesthesia was also reviewed. The risks                            and benefits of the procedure and the sedation                            options and risks were discussed with the patient.                            All questions were answered, and informed consent                            was obtained. After reviewing the risks and                            benefits, the patient was deemed in satisfactory                            condition to undergo the procedure.                           After obtaining informed consent, the colonoscope                            was passed under direct vision. Throughout the                            procedure, the patient's blood pressure, pulse, and                            oxygen saturations were monitored continuously. The                            [id number]-[id number] ([id number]) scope was introduced through                            the anus and advanced to the the cecum, identified  by appendiceal orifice and ileocecal valve. The                            colonoscopy was performed without difficulty. The                            patient tolerated the procedure well. The quality                            of the bowel preparation was adequate. The                            ileocecal valve, appendiceal orifice, and rectum                            were photographed. Scope In: 7:56:01 AM Scope Out: 8:20:16 AM Scope Withdrawal Time: 0 hours 18 minutes 16 seconds  Total Procedure Duration: 0 hours 24 minutes 15 seconds  Findings:      The perianal and digital rectal examinations were normal.      A small polyp was found in the ascending colon. Biopsies were taken with       a cold forceps for histology. The pathology specimen was placed into       Bottle Number 3.      Three polyps were found in the recto-sigmoid colon, sigmoid  colon and       splenic flexure. The polyps were 4 to 6 mm in size. These polyps were       removed with a cold snare. Resection and retrieval were complete. The       pathology specimen was placed into Bottle Number 3.      External hemorrhoids were found during retroflexion. The hemorrhoids       were large.      Anal papilla hypertrophied. Impression:               - One small polyp in the ascending colon. Biopsied.                           - Three 4 to 6 mm polyps at the recto-sigmoid                            colon, in the sigmoid colon and at the splenic                            flexure, removed with a cold snare. Resected and                            retrieved.                           - External hemorrhoids.                           - Anal papilla. Moderate Sedation:      Per Anesthesia Care Recommendation:           - Patient has a contact number available for  emergencies. The signs and symptoms of potential                            delayed complications were discussed with the                            patient. Return to normal activities tomorrow.                            Written discharge instructions were provided to the                            patient.                           - Resume previous diet today.                           - Continue present medications.                           - Await pathology results.                           - See the other procedure note for documentation of                            additional recommendations.                           - Repeat colonoscopy date to be determined after                            pending pathology results are reviewed. Procedure Code(s):        --- Professional ---                           [id number], Colonoscopy, flexible; with removal of                            tumor(s), polyp(s), or other lesion(s) by snare                            technique                            45380, 16, Colonoscopy, flexible; with biopsy,                            single or multiple Diagnosis Code(s):        --- Professional ---                           K63.5, Polyp of colon                           Z12.11, Encounter for screening for malignant  neoplasm of colon                           K62.89, Other specified diseases of anus and rectum                           K64.4, Residual hemorrhoidal skin tags CPT copyright 2019 [employer]. All rights reserved. The codes documented in this report are preliminary and upon coder review may  be revised to meet current compliance requirements. Hildred Laser, MD Hildred Laser, MD 01/17/2020 8:39:48 AM This report has been signed electronically. Number of Addenda: 0

## 2020-01-20 ENCOUNTER — Other Ambulatory Visit: Payer: Self-pay

## 2020-01-20 LAB — SURGICAL PATHOLOGY

## 2020-01-23 ENCOUNTER — Encounter: Payer: Self-pay | Admitting: General Surgery

## 2020-01-23 ENCOUNTER — Ambulatory Visit (INDEPENDENT_AMBULATORY_CARE_PROVIDER_SITE_OTHER): Payer: Medicare HMO | Admitting: General Surgery

## 2020-01-23 ENCOUNTER — Other Ambulatory Visit: Payer: Self-pay

## 2020-01-23 VITALS — BP 120/75 | HR 76 | Temp 97.5°F | Resp 16 | Ht 74.0 in | Wt 224.0 lb

## 2020-01-23 DIAGNOSIS — K8020 Calculus of gallbladder without cholecystitis without obstruction: Secondary | ICD-10-CM

## 2020-01-23 DIAGNOSIS — K429 Umbilical hernia without obstruction or gangrene: Secondary | ICD-10-CM | POA: Diagnosis not present

## 2020-01-23 DIAGNOSIS — K7460 Unspecified cirrhosis of liver: Secondary | ICD-10-CM

## 2020-01-23 NOTE — Progress Notes (Signed)
[facility] History and Physical  Chief Complaint    Follow-up       Alexander Parks is a 69 y.o. male.  HPI:  Alexander Parks is a 69 yo who was seen by me last month for gallbladder complaints and pain in the RUQ with associated nausea and vomiting after eating fried fish. He says he symptoms have been better but he has not been eating any fried food. He has been evaluated by cardiology given his cardiac history and CABG in 2019, and has been evaluated by Dr. Laural Golden due to his findings concerning for cirrhosis on his imaging.  He has also undergone a [place] to verify the gallstones and further evaluate the cirrhosis. Dr. Laural Golden took the patient back for an EGD and colonoscopy and saw no varices. The patient returns to discuss the option of cholecystectomy.   Dr. Laural Golden has requested a liver biopsy at the time of cholecystectomy.  Cardiology put in at a 6.6% risk of major perioperative cardiac event and felt he could proceed with surgery.  He has no new chest pain or SOB.   Past Medical History:  Diagnosis Date  . Allergy   . Diabetes mellitus without complication [facility])     Past Surgical History:  Procedure Laterality Date  . BALLOON DILATION  08/21/2012   Procedure: BALLOON DILATION;  Surgeon: Marissa Nestle, MD;  Location: [facility];  Service: Urology;  Laterality: Right;  Balloon Dilation Right Ureter  . BIOPSY  01/17/2020   Procedure: BIOPSY;  Surgeon: Rogene Houston, MD;  Location: [facility];  Service: Endoscopy;;  antral esophageal  . COLONOSCOPY WITH PROPOFOL N/A 01/17/2020   Procedure: COLONOSCOPY WITH PROPOFOL;  Surgeon: Rogene Houston, MD;  [facility];  Service: Endoscopy;  Laterality: N/A;  730  . CORONARY ARTERY BYPASS GRAFT N/A 02/08/2018   Procedure: CORONARY ARTERY BYPASS GRAFT times five, LIMA-LAD, SVG-DIAG, SVG-OM, SVG-PD-PL(SEQ), using left internal mammary artery and Endoharvest of Right greater saphenous vein;  Surgeon: Grace Isaac,  MD;  Location: [place];  Service: Open Heart Surgery;  Laterality: N/A;  . CYSTOSCOPY W/ URETERAL STENT PLACEMENT  08/21/2012   Procedure: CYSTOSCOPY WITH RETROGRADE PYELOGRAM/URETERAL STENT PLACEMENT;  Surgeon: Marissa Nestle, MD;  Location: [facility];  Service: Urology;  Laterality: Right;  Cystoscopy with Right Retrograde Pyelogram, Right Ureteral Stent Placement  . ESOPHAGOGASTRODUODENOSCOPY (EGD) WITH PROPOFOL N/A 01/17/2020   Procedure: ESOPHAGOGASTRODUODENOSCOPY (EGD) WITH PROPOFOL;  Surgeon: Rogene Houston, MD;  [facility];  Service: Endoscopy;  Laterality: N/A;  . HERNIA REPAIR    . HOLMIUM LASER APPLICATION  03/15/174   Procedure: HOLMIUM LASER APPLICATION;  Surgeon: Marissa Nestle, MD;  Location: [facility];  Service: Urology;  Laterality: Right;  Holmium Laser Application to Right Ureteral Calculus  . INTRAOPERATIVE TRANSESOPHAGEAL ECHOCARDIOGRAM N/A 02/08/2018   Procedure: INTRAOPERATIVE TRANSESOPHAGEAL ECHOCARDIOGRAM;  Surgeon: Grace Isaac, MD;  Location: [facility];  Service: Open Heart Surgery;  Laterality: N/A;  . POLYPECTOMY  01/17/2020   Procedure: POLYPECTOMY;  Surgeon: Rogene Houston, MD;  [facility];  Service: Endoscopy;;  colon  . RIGHT/LEFT HEART CATH AND CORONARY ANGIOGRAPHY N/A 02/07/2018   Procedure: RIGHT/LEFT HEART CATH AND CORONARY ANGIOGRAPHY;  Surgeon: Burnell Blanks, MD;  Location: [place] [facility];  Service: Cardiovascular;  Laterality: N/A;  . STONE EXTRACTION WITH BASKET  08/21/2012   Procedure: STONE EXTRACTION WITH BASKET;  Surgeon: Marissa Nestle, MD;  Location: [facility];  Service: Urology;  Laterality: Right;  Right Ureteral Stone Basket Extraction    Family History  Problem Relation Age of Onset  . Cancer Mother   . Cancer Sister   . Liver disease Brother     Social History   Tobacco Use  . Smoking status: Former Smoker    Packs/day: 2.00    Years: 15.00    Pack years: 30.00    Types: Cigarettes    Quit date:  08/16/1996    Years since quitting: 23.4  . Smokeless tobacco: Never Used  Substance Use Topics  . Alcohol use: No  . Drug use: No    Medications: I have reviewed the patient's current medications. Allergies as of 01/23/2020      Reactions   Benadryl [diphenhydramine Hcl] Swelling   Iodinated Diagnostic Agents Other (See Comments)   Unknown   Morphine And Related Other (See Comments)   Unknown      Medication List       Accurate as of January 23, 2020  2:49 PM. If you have any questions, ask your nurse or doctor.        aspirin EC 81 MG tablet Take 1 tablet (81 mg total) by mouth daily.   atorvastatin 80 MG tablet Commonly known as: LIPITOR Take 1 tablet (80 mg total) by mouth daily at 6 PM.   carvedilol 3.125 MG tablet Commonly known as: COREG TAKE 1 TABLET BY MOUTH TWICE DAILY WITH A MEAL What changed: See the new instructions.   furosemide 40 MG tablet Commonly known as: LASIX Take 1 tablet (40 mg total) by mouth daily.   lisinopril 2.5 MG tablet Commonly known as: ZESTRIL Take 1 tablet by mouth once daily   metFORMIN 500 MG 24 hr tablet Commonly known as: GLUCOPHAGE-XR Take 1,000 mg by mouth 2 (two) times daily.   pantoprazole 40 MG tablet Commonly known as: PROTONIX Take 1 tablet (40 mg total) by mouth daily before breakfast.   triamcinolone cream 0.1 % Commonly known as: KENALOG Apply 1 application topically in the morning, at noon, and at bedtime.        ROS:  A comprehensive review of systems was negative except for: Gastrointestinal: positive for abdominal pain  Blood pressure 120/75, pulse 76, temperature (!) 97.5 F (36.4 C), temperature source Other (Comment), resp. rate 16, height 6\' 2"  (1.88 m), weight 224 lb (101.6 kg), SpO2 94 %. Physical Exam Vitals reviewed.  Constitutional:      Appearance: Normal appearance.  HENT:     Head: Normocephalic.     Nose: Nose normal.     Mouth/Throat:     Mouth: Mucous membranes are moist.  Eyes:      Pupils: Pupils are equal, round, and reactive to light.  Cardiovascular:     Rate and Rhythm: Normal rate.  Pulmonary:     Effort: Pulmonary effort is normal.     Breath sounds: Normal breath sounds.  Abdominal:     General: There is distension.     Palpations: Abdomen is soft.     Tenderness: There is no abdominal tenderness.  Musculoskeletal:        General: No swelling. Normal range of motion.     Cervical back: Normal range of motion. No rigidity.  Skin:    General: Skin is warm and dry.  Neurological:     General: No focal deficit present.     Mental Status: He is alert and oriented to person, place, and time.  Psychiatric:  Mood and Affect: Mood normal.        Behavior: Behavior normal.        Thought Content: Thought content normal.        Judgment: Judgment normal.     Results: Narrative & Impression  CLINICAL DATA:  Hepatic cirrhosis  EXAM: ULTRASOUND ABDOMEN LIMITED RIGHT UPPER QUADRANT  COMPARISON:  Chest CT including portions of upper abdomen August 01, 2019  FINDINGS: Gallbladder:  Within the gallbladder, there are echogenic foci which move and shadow consistent with cholelithiasis. Largest gallstone measures 7 mm in length. There is also sludge in the gallbladder. There is no evident gallbladder wall thickening or pericholecystic fluid. No sonographic Murphy sign noted by sonographer.  Common bile duct:  Diameter: 5 mm. No intrahepatic or extrahepatic biliary duct dilatation.  Liver:  No focal lesion identified. Liver has a nodular contour with diffuse increased liver echogenicity. Portal vein is patent on color Doppler imaging with normal direction of blood flow towards the liver.  Other: None.  IMPRESSION: 1. Cholelithiasis and sludge in gallbladder. No evident gallbladder wall thickening or pericholecystic fluid.  2. Liver has a nodular contour with increased echogenicity, findings indicative of hepatic cirrhosis,  potentially with superimposed hepatic steatosis. No focal liver lesions are evident; it must be cautioned that the sensitivity of ultrasound for detection of focal liver lesions is diminished significantly in this circumstance.   Electronically Signed   By: Lowella Grip III M.D.   On: 12/27/2019 15:44     Assessment & Plan:  ALDRIN ENGELHARD is a 69 y.o. male with symptomatic cholelithiasis with fried foods, some degree of fatty liver/ cirrhosis, who has been evaluated by cardiology and GI. He is now able to proceed with surgery.   -PLAN: I counseled the patient about the indication, risks and benefits of laparoscopic cholecystectomy.  He understands there is a very small chance for bleeding, infection, injury to normal structures (including common bile duct), conversion to open surgery, persistent symptoms, evolution of postcholecystectomy diarrhea, need for secondary interventions, anesthesia reaction, cardiopulmonary issues and other risks not specifically detailed here. I described the expected recovery, the plan for follow-up and the restrictions during the recovery phase.  All questions were answered.  -Discussed liver biopsy at the time of surgery and slight increased risk of bleeding.  -Discussed preoperative COVID testing.   All questions were answered to the satisfaction of the patient and family.    Virl Cagey 01/23/2020, 2:49 PM

## 2020-01-23 NOTE — Patient Instructions (Signed)
Laparoscopic Cholecystectomy Laparoscopic cholecystectomy is surgery to remove the gallbladder. The gallbladder is a pear-shaped organ that lies beneath the liver on the right side of the body. The gallbladder stores bile, which is a fluid that helps the body to digest fats. Cholecystectomy is often done for inflammation of the gallbladder (cholecystitis). This condition is usually caused by a buildup of gallstones (cholelithiasis) in the gallbladder. Gallstones can block the flow of bile, which can result in inflammation and pain. In severe cases, emergency surgery may be required. This procedure is done though small incisions in your abdomen (laparoscopic surgery). A thin scope with a camera (laparoscope) is inserted through one incision. Thin surgical instruments are inserted through the other incisions. In some cases, a laparoscopic procedure may be turned into a type of surgery that is done through a larger incision (open surgery). Tell a health care provider about:  Any allergies you have.  All medicines you are taking, including vitamins, herbs, eye drops, creams, and over-the-counter medicines.  Any problems you or family members have had with anesthetic medicines.  Any blood disorders you have.  Any surgeries you have had.  Any medical conditions you have.  Whether you are pregnant or may be pregnant. What are the risks? Generally, this is a safe procedure. However, problems may occur, including:  Infection.  Bleeding.  Allergic reactions to medicines.  Damage to other structures or organs.  A stone remaining in the common bile duct. The common bile duct carries bile from the gallbladder into the small intestine.  A bile leak from the cyst duct that is clipped when your gallbladder is removed. Medicines  Ask your health care provider about: ? Changing or stopping your regular medicines. This is especially important if you are taking diabetes medicines or blood  thinners. ? Taking medicines such as aspirin and ibuprofen. These medicines can thin your blood. Do not take these medicines before your procedure if your health care provider instructs you not to.  You may be given antibiotic medicine to help prevent infection. General instructions  Let your health care provider know if you develop a cold or an infection before surgery.  Plan to have someone take you home from the hospital or clinic.  Ask your health care provider how your surgical site will be marked or identified. What happens during the procedure?   To reduce your risk of infection: ? Your health care team will wash or sanitize their hands. ? Your skin will be washed with soap. ? Hair may be removed from the surgical area.  An IV tube may be inserted into one of your veins.  You will be given one or more of the following: ? A medicine to help you relax (sedative). ? A medicine to make you fall asleep (general anesthetic).  A breathing tube will be placed in your mouth.  Your surgeon will make several small cuts (incisions) in your abdomen.  The laparoscope will be inserted through one of the small incisions. The camera on the laparoscope will send images to a TV screen (monitor) in the operating room. This lets your surgeon see inside your abdomen.  Air-like gas will be pumped into your abdomen. This will expand your abdomen to give the surgeon more room to perform the surgery.  Other tools that are needed for the procedure will be inserted through the other incisions. The gallbladder will be removed through one of the incisions.  Your common bile duct may be examined. If stones are found  in the common bile duct, they may be removed.  After your gallbladder has been removed, the incisions will be closed with stitches (sutures), staples, or skin glue.  Your incisions may be covered with a bandage (dressing). The procedure may vary among health care providers and  hospitals. What happens after the procedure?  Your blood pressure, heart rate, breathing rate, and blood oxygen level will be monitored until the medicines you were given have worn off.  You will be given medicines as needed to control your pain.  Do not drive for 24 hours if you were given a sedative. This information is not intended to replace advice given to you by your health care provider. Make sure you discuss any questions you have with your health care provider. Document Revised: 07/14/2017 Document Reviewed: 01/18/2016 Elsevier Patient Education  2020 [place].   Liver Biopsy  The liver is a large organ in the upper right side of the abdomen. A liver biopsy is a procedure in which a tissue sample is taken from the liver and examined under a microscope. There are three types of liver biopsies:  Percutaneous. A needle is used to remove a sample through an incision in your abdomen.  Laparoscopic. Several incisions are made in the abdomen. A sample is removed with the help of a tiny camera.  Transjugular. An incision is made in your neck in the area of the jugular vein. A sample is removed through a small flexible tube that is passed down the blood vessel and into your liver. Tell a health care provider about:  Any allergies you have.  All medicines you are taking, including vitamins, herbs, eye drops, creams, and over-the-counter medicines.  Any problems you or family members have had with anesthetic medicines.  Any blood disorders you have.  Any surgeries you have had.  Any medical conditions you have.  Whether you are pregnant or may be pregnant. What are the risks? Generally, this is a safe procedure. However, problems can occur and include:  Bleeding.  Infection.  Bruising.  Pain.  Injury to nearby organs or tissues, such as nerves, gallbladder, liver, or lungs. What happens before the procedure? Eating and drinking restrictions  You may be asked  not to drink or eat for 6-8 hours before the liver biopsy. You may be allowed to eat a light breakfast. Talk to your health care provider about when you should stop eating and drinking. Medicines Ask your health care provider about:  Changing or stopping your regular medicines. This is especially important if you are taking diabetes medicines or blood thinners.  Taking medicines such as aspirin and ibuprofen. These medicines can thin your blood. Do not take these medicines unless your health care provider tells you to take them.  Taking over-the-counter medicines, vitamins, herbs, and supplements. General instructions  Do not use any products that contain nicotine or tobacco, such as cigarettes and e-cigarettes. If you need help quitting, ask your health care provider.  Plan to have someone take you home from the hospital or clinic.  Plan to have a responsible adult care for you for at least 24 hours after you leave the hospital or clinic. This is important.  You may have blood or urine tests.  Ask your health care provider what steps will be taken to prevent infection. These may include: ? Removing hair at the surgery site. ? Washing skin with a germ-killing soap. ? Taking antibiotic medicine. What happens during the procedure?  An IV will be  inserted into one of your veins. ? You will be given one or more of the following: ? A medicine to help you relax (sedative). ? A medicine to numb the area (local anesthetic). ? A medicine to make you fall asleep (general anesthetic).  Your health care provider will use one of the following procedures to remove samples from your liver. These procedures may vary among health care providers and hospitals. Percutaneous liver biopsy  You will lie on your back, with your right hand over your head.  A health care provider will locate your liver by tapping and pressing on the right side of your abdomen, or by using an ultrasound or CT scan.  A  local anesthetic will be used to numb an area at the bottom of your last right rib.  A small incision will be made in the numbed area.  A biopsy needle will be inserted into the incision.  Several samples of liver tissue will be taken. You will be asked to hold your breath as each sample is taken.  The incision will be closed with stitches (sutures).  A bandage (dressing) may be placed over the incision. Laparoscopic liver biopsy  You will lie on your back.  Several small incisions will be made in your abdomen.  Your health care provider will pass a tiny camera through one incision. The camera will allow the liver to be viewed on a TV monitor in the operating room.  Tools will be passed through the other incision or incisions.  Samples of the liver will be removed using the tools.  The incisions will be closed with stitches (sutures).  A bandage (dressing) may be placed over the incisions. Transjugular liver biopsy  You will lie on your back on an X-ray table, with your head turned to your left.  An area on your neck, just over your jugular vein, will be numbed.  An incision will be made in the numbed area.  A tiny tube will be inserted through the incision. The tube will be passed into the jugular vein to a blood vessel in the liver called the hepatic vein.  A dye will be injected through the tube.  X-rays will be taken. The dye will make the blood vessels in the liver light up on the X-rays.  The biopsy needle will be placed through the tube until it reaches the liver.  Samples of liver tissue will be taken with the biopsy needle.  The needle and the tube will be removed.  The incision will be closed with stitches (sutures).  A bandage (dressing) may be placed over the incision. What happens after the procedure?  Your blood pressure, heart rate, breathing rate, and blood oxygen level will be monitored until you leave the hospital or clinic.  You will be asked to  rest quietly for 2-4 hours or longer.  You will be closely monitored for bleeding from the biopsy site.  You may be allowed to go home when the medicines have worn off and you can walk, drink, eat, and use the bathroom. Summary  A liver biopsy is a procedure in which a tissue sample is taken from the liver and examined under a microscope.  This is a safe procedure, but problems can occur, including bleeding, infection, pain, or injury to nearby organs or tissues.  Ask your health care provider about changing or stopping your regular medicines.  Plan to have someone take you home from the hospital or clinic and to be with  you for 24 hours after the procedure. This information is not intended to replace advice given to you by your health care provider. Make sure you discuss any questions you have with your health care provider. Document Revised: 08/11/2017 Document Reviewed: 08/11/2017 Elsevier Patient Education  [place].  Umbilical Hernia, Adult  A hernia is a bulge of tissue that pushes through an opening between muscles. An umbilical hernia happens in the abdomen, near the belly button (umbilicus). The hernia may contain tissues from the small intestine, large intestine, or fatty tissue covering the intestines (omentum). Umbilical hernias in adults tend to get worse over time, and they require surgical treatment. There are several types of umbilical hernias. You may have:  A hernia located just above or below the umbilicus (indirect hernia). This is the most common type of umbilical hernia in adults.  A hernia that forms through an opening formed by the umbilicus (direct hernia).  A hernia that comes and goes (reducible hernia). A reducible hernia may be visible only when you strain, lift something heavy, or cough. This type of hernia can be pushed back into the abdomen (reduced).  A hernia that traps abdominal tissue inside the hernia (incarcerated hernia). This type of  hernia cannot be reduced.  A hernia that cuts off blood flow to the tissues inside the hernia (strangulated hernia). The tissues can start to die if this happens. This type of hernia requires emergency treatment. What are the causes? An umbilical hernia happens when tissue inside the abdomen presses on a weak area of the abdominal muscles. What increases the risk? You may have a greater risk of this condition if you:  Are obese.  Have had several pregnancies.  Have a buildup of fluid inside your abdomen (ascites).  Have had surgery that weakens the abdominal muscles. What are the signs or symptoms? The main symptom of this condition is a painless bulge at or near the belly button. A reducible hernia may be visible only when you strain, lift something heavy, or cough. Other symptoms may include:  Dull pain.  A feeling of pressure. Symptoms of a strangulated hernia may include:  Pain that gets increasingly worse.  Nausea and vomiting.  Pain when pressing on the hernia.  Skin over the hernia becoming red or purple.  Constipation.  Blood in the stool. How is this diagnosed? This condition may be diagnosed based on:  A physical exam. You may be asked to cough or strain while standing. These actions increase the pressure inside your abdomen and force the hernia through the opening in your muscles. Your health care provider may try to reduce the hernia by pressing on it.  Your symptoms and medical history. How is this treated? Surgery is the only treatment for an umbilical hernia. Surgery for a strangulated hernia is done as soon as possible. If you have a small hernia that is not incarcerated, you may need to lose weight before having surgery. Follow these instructions at home:  Lose weight, if told by your health care provider.  Do not try to push the hernia back in.  Watch your hernia for any changes in color or size. Tell your health care provider if any changes  occur.  You may need to avoid activities that increase pressure on your hernia.  Do not lift anything that is heavier than 10 lb (4.5 kg) until your health care provider says that this is safe.  Take over-the-counter and prescription medicines only as told by your  health care provider.  Keep all follow-up visits as told by your health care provider. This is important. Contact a health care provider if:  Your hernia gets larger.  Your hernia becomes painful. Get help right away if:  You develop sudden, severe pain near the area of your hernia.  You have pain as well as nausea or vomiting.  You have pain and the skin over your hernia changes color.  You develop a fever. This information is not intended to replace advice given to you by your health care provider. Make sure you discuss any questions you have with your health care provider. Document Revised: 09/13/2017 Document Reviewed: 01/30/2017 Elsevier Patient Education  [place].

## 2020-01-26 NOTE — H&P (Signed)
[facility] History and Physical  Chief Complaint    Follow-up       Alexander Parks is a 69 y.o. male.  HPI:  Alexander Parks is a 69 yo who was seen by me last month for gallbladder complaints and pain in the RUQ with associated nausea and vomiting after eating fried fish. He says he symptoms have been better but he has not been eating any fried food. He has been evaluated by cardiology given his cardiac history and CABG in 2019, and has been evaluated by Dr. Laural Golden due to his findings concerning for cirrhosis on his imaging.  He has also undergone a [place] to verify the gallstones and further evaluate the cirrhosis. Dr. Laural Golden took the patient back for an EGD and colonoscopy and saw no varices. The patient returns to discuss the option of cholecystectomy.   Dr. Laural Golden has requested a liver biopsy at the time of cholecystectomy.  Cardiology put in at a 6.6% risk of major perioperative cardiac event and felt he could proceed with surgery.  He has no new chest pain or SOB.   Past Medical History:  Diagnosis Date  . Allergy   . Diabetes mellitus without complication [facility]     Past Surgical History:  Procedure Laterality Date  . BALLOON DILATION  08/21/2012   Procedure: BALLOON DILATION;  Surgeon: Marissa Nestle, MD;  Location: [facility];  Service: Urology;  Laterality: Right;  Balloon Dilation Right Ureter  . BIOPSY  01/17/2020   Procedure: BIOPSY;  Surgeon: Rogene Houston, MD;  Location: [facility];  Service: Endoscopy;;  antral esophageal  . COLONOSCOPY WITH PROPOFOL N/A 01/17/2020   Procedure: COLONOSCOPY WITH PROPOFOL;  Surgeon: Rogene Houston, MD;  [facility]: [facility];  Service: Endoscopy;  Laterality: N/A;  730  . CORONARY ARTERY BYPASS GRAFT N/A 02/08/2018   Procedure: CORONARY ARTERY BYPASS GRAFT times five, LIMA-LAD, SVG-DIAG, SVG-OM, SVG-PD-PL(SEQ), using left internal mammary artery and Endoharvest of Right greater saphenous vein;  Surgeon: Grace Isaac,  MD;  Location: [facility];  Service: Open Heart Surgery;  Laterality: N/A;  . CYSTOSCOPY W/ URETERAL STENT PLACEMENT  08/21/2012   Procedure: CYSTOSCOPY WITH RETROGRADE PYELOGRAM/URETERAL STENT PLACEMENT;  Surgeon: Marissa Nestle, MD;  Location: [facility];  Service: Urology;  Laterality: Right;  Cystoscopy with Right Retrograde Pyelogram, Right Ureteral Stent Placement  . ESOPHAGOGASTRODUODENOSCOPY (EGD) WITH PROPOFOL N/A 01/17/2020   Procedure: ESOPHAGOGASTRODUODENOSCOPY (EGD) WITH PROPOFOL;  Surgeon: Rogene Houston, MD;  [facility];  Service: Endoscopy;  Laterality: N/A;  . HERNIA REPAIR    . HOLMIUM LASER APPLICATION  10/17/3612   Procedure: HOLMIUM LASER APPLICATION;  Surgeon: Marissa Nestle, MD;  Location: [facility];  Service: Urology;  Laterality: Right;  Holmium Laser Application to Right Ureteral Calculus  . INTRAOPERATIVE TRANSESOPHAGEAL ECHOCARDIOGRAM N/A 02/08/2018   Procedure: INTRAOPERATIVE TRANSESOPHAGEAL ECHOCARDIOGRAM;  Surgeon: Grace Isaac, MD;  Location: [facility];  Service: Open Heart Surgery;  Laterality: N/A;  . POLYPECTOMY  01/17/2020   Procedure: POLYPECTOMY;  Surgeon: Rogene Houston, MD;  [facility];  Service: Endoscopy;;  colon  . RIGHT/LEFT HEART CATH AND CORONARY ANGIOGRAPHY N/A 02/07/2018   Procedure: RIGHT/LEFT HEART CATH AND CORONARY ANGIOGRAPHY;  Surgeon: Burnell Blanks, MD;  Location: [facility];  Service: Cardiovascular;  Laterality: N/A;  . STONE EXTRACTION WITH BASKET  08/21/2012   Procedure: STONE EXTRACTION WITH BASKET;  Surgeon: Marissa Nestle, MD;  Location: [facility];  Service: Urology;  Laterality: Right;  Right Ureteral Stone Basket Extraction    Family History  Problem Relation Age of Onset  . Cancer Mother   . Cancer Sister   . Liver disease Brother     Social History   Tobacco Use  . Smoking status: Former Smoker    Packs/day: 2.00    Years: 15.00    Pack years: 30.00    Types: Cigarettes    Quit date:  08/16/1996    Years since quitting: 23.4  . Smokeless tobacco: Never Used  Substance Use Topics  . Alcohol use: No  . Drug use: No    Medications: I have reviewed the patient's current medications. Allergies as of 01/23/2020      Reactions   Benadryl [diphenhydramine Hcl] Swelling   Iodinated Diagnostic Agents Other (See Comments)   Unknown   Morphine And Related Other (See Comments)   Unknown      Medication List       Accurate as of January 23, 2020  2:49 PM. If you have any questions, ask your nurse or doctor.        aspirin EC 81 MG tablet Take 1 tablet (81 mg total) by mouth daily.   atorvastatin 80 MG tablet Commonly known as: LIPITOR Take 1 tablet (80 mg total) by mouth daily at 6 PM.   carvedilol 3.125 MG tablet Commonly known as: COREG TAKE 1 TABLET BY MOUTH TWICE DAILY WITH A MEAL What changed: See the new instructions.   furosemide 40 MG tablet Commonly known as: LASIX Take 1 tablet (40 mg total) by mouth daily.   lisinopril 2.5 MG tablet Commonly known as: ZESTRIL Take 1 tablet by mouth once daily   metFORMIN 500 MG 24 hr tablet Commonly known as: GLUCOPHAGE-XR Take 1,000 mg by mouth 2 (two) times daily.   pantoprazole 40 MG tablet Commonly known as: PROTONIX Take 1 tablet (40 mg total) by mouth daily before breakfast.   triamcinolone cream 0.1 % Commonly known as: KENALOG Apply 1 application topically in the morning, at noon, and at bedtime.        ROS:  A comprehensive review of systems was negative except for: Gastrointestinal: positive for abdominal pain  Blood pressure 120/75, pulse 76, temperature (!) 97.5 F (36.4 C), temperature source Other (Comment), resp. rate 16, height 6\' 2"  (1.88 m), weight 224 lb (101.6 kg), SpO2 94 %. Physical Exam Vitals reviewed.  Constitutional:      Appearance: Normal appearance.  HENT:     Head: Normocephalic.     Nose: Nose normal.     Mouth/Throat:     Mouth: Mucous membranes are moist.  Eyes:      Pupils: Pupils are equal, round, and reactive to light.  Cardiovascular:     Rate and Rhythm: Normal rate.  Pulmonary:     Effort: Pulmonary effort is normal.     Breath sounds: Normal breath sounds.  Abdominal:     General: There is distension.     Palpations: Abdomen is soft.     Tenderness: There is no abdominal tenderness.  Musculoskeletal:        General: No swelling. Normal range of motion.     Cervical back: Normal range of motion. No rigidity.  Skin:    General: Skin is warm and dry.  Neurological:     General: No focal deficit present.     Mental Status: He is alert and oriented to person, place, and time.  Psychiatric:  Mood and Affect: Mood normal.        Behavior: Behavior normal.        Thought Content: Thought content normal.        Judgment: Judgment normal.     Results: Narrative & Impression  CLINICAL DATA:  Hepatic cirrhosis  EXAM: ULTRASOUND ABDOMEN LIMITED RIGHT UPPER QUADRANT  COMPARISON:  Chest CT including portions of upper abdomen August 01, 2019  FINDINGS: Gallbladder:  Within the gallbladder, there are echogenic foci which move and shadow consistent with cholelithiasis. Largest gallstone measures 7 mm in length. There is also sludge in the gallbladder. There is no evident gallbladder wall thickening or pericholecystic fluid. No sonographic Murphy sign noted by sonographer.  Common bile duct:  Diameter: 5 mm. No intrahepatic or extrahepatic biliary duct dilatation.  Liver:  No focal lesion identified. Liver has a nodular contour with diffuse increased liver echogenicity. Portal vein is patent on color Doppler imaging with normal direction of blood flow towards the liver.  Other: None.  IMPRESSION: 1. Cholelithiasis and sludge in gallbladder. No evident gallbladder wall thickening or pericholecystic fluid.  2. Liver has a nodular contour with increased echogenicity, findings indicative of hepatic cirrhosis,  potentially with superimposed hepatic steatosis. No focal liver lesions are evident; it must be cautioned that the sensitivity of ultrasound for detection of focal liver lesions is diminished significantly in this circumstance.   Electronically Signed   By: Lowella Grip III M.D.   On: 12/27/2019 15:44     Assessment & Plan:  ISSAI WERLING is a 69 y.o. male with symptomatic cholelithiasis with fried foods, some degree of fatty liver/ cirrhosis, who has been evaluated by cardiology and GI. He is now able to proceed with surgery.   -PLAN: I counseled the patient about the indication, risks and benefits of laparoscopic cholecystectomy.  He understands there is a very small chance for bleeding, infection, injury to normal structures (including common bile duct), conversion to open surgery, persistent symptoms, evolution of postcholecystectomy diarrhea, need for secondary interventions, anesthesia reaction, cardiopulmonary issues and other risks not specifically detailed here. I described the expected recovery, the plan for follow-up and the restrictions during the recovery phase.  All questions were answered.  -Discussed liver biopsy at the time of surgery and slight increased risk of bleeding.  -Discussed preoperative COVID testing.  -Will also plan to repair his umbilical hernia with sutures at the time of his cholecystectomy, given that we will be placing a trocar into that area.   All questions were answered to the satisfaction of the patient and family.    Virl Cagey 01/23/2020, 2:49 PM

## 2020-02-04 NOTE — Patient Instructions (Signed)
Alexander Parks  02/04/2020     @PREFPERIOPPHARMACY @   Your procedure is scheduled on  02/12/2020 .  Report to [facility] at  0730  A.M.  Call this number if you have problems the morning of surgery:  [phone]   Remember:  Do not eat or drink after midnight.                        Take these medicines the morning of surgery with A SIP OF WATER  Coreg, protonix.    Do not wear jewelry, make-up or nail polish.  Do not wear lotions, powders, or perfume. Please wear deodorant and brush your teeth.  Do not shave 48 hours prior to surgery.  Men may shave face and neck.  Do not bring valuables to the hospital.  [facility] is not responsible for any belongings or valuables.  Contacts, dentures or bridgework may not be worn into surgery.  Leave your suitcase in the car.  After surgery it may be brought to your room.  For patients admitted to the hospital, discharge time will be determined by your treatment team.  Patients discharged the day of surgery will not be allowed to drive home.   Name and phone number of your driver:   family Special instructions:  DO NOT smoke the morning of your procedure.  Please read over the following fact sheets that you were given. Anesthesia Post-op Instructions and Care and Recovery After Surgery       Liver Biopsy, Care After These instructions give you information on caring for yourself after your procedure. Your doctor may also give you more specific instructions. Call your doctor if you have any problems or questions after your procedure. What can I expect after the procedure? After the procedure, it is common to have:  Pain and soreness where the biopsy was done.  Bruising around the area where the biopsy was done.  Sleepiness and be tired for a few days. Follow these instructions at home: Medicines  Take over-the-counter and prescription medicines only as told by your doctor.  If you were prescribed an  antibiotic medicine, take it as told by your doctor. Do not stop taking the antibiotic even if you start to feel better.  Do not take medicines such as aspirin and ibuprofen. These medicines can thin your blood. Do not take these medicines unless your doctor tells you to take them.  If you are taking prescription pain medicine, take actions to prevent or treat constipation. Your doctor may recommend that you: ? Drink enough fluid to keep your pee (urine) clear or pale yellow. ? Take over-the-counter or prescription medicines. ? Eat foods that are high in fiber, such as fresh fruits and vegetables, whole grains, and beans. ? Limit foods that are high in fat and processed sugars, such as fried and sweet foods. Caring for your cut  Follow instructions from your doctor about how to take care of your cuts from surgery (incisions). Make sure you: ? Wash your hands with soap and water before you change your bandage (dressing). If you cannot use soap and water, use hand sanitizer. ? Change your bandage as told by your doctor. ? Leave stitches (sutures), skin glue, or skin tape (adhesive) strips in place. They may need to stay in place for 2 weeks or longer. If tape strips get loose and curl up, you may trim the  loose edges. Do not remove tape strips completely unless your doctor says it is okay.  Check your cuts every day for signs of infection. Check for: ? Redness, swelling, or more pain. ? Fluid or blood. ? Pus or a bad smell. ? Warmth.  Do not take baths, swim, or use a hot tub until your doctor says it is okay to do so. Activity   Rest at home for 1-2 days or as told by your doctor. ? Avoid sitting for a long time without moving. Get up to take short walks every 1-2 hours.  Return to your normal activities as told by your doctor. Ask what activities are safe for you.  Do not do these things in the first 24 hours: ? Drive. ? Use machinery. ? Take a bath or shower.  Do not lift more  than 10 pounds (4.5 kg) or play contact sports for the first 2 weeks. General instructions   Do not drink alcohol in the first week after the procedure.  Have someone stay with you for at least 24 hours after the procedure.  Get your test results. Ask your doctor or the department that is doing the test: ? When will my results be ready? ? How will I get my results? ? What are my treatment options? ? What other tests do I need? ? What are my next steps?  Keep all follow-up visits as told by your doctor. This is important. Contact a doctor if:  A cut bleeds and leaves more than just a small spot of blood.  A cut is red, puffs up (swells), or hurts more than before.  Fluid or something else comes from a cut.  A cut smells bad.  You have a fever or chills. Get help right away if:  You have swelling, bloating, or pain in your belly (abdomen).  You get dizzy or faint.  You have a rash.  You feel sick to your stomach (nauseous) or throw up (vomit).  You have trouble breathing, feel short of breath, or feel faint.  Your chest hurts.  You have problems talking or seeing.  You have trouble with your balance or moving your arms or legs. Summary  After the procedure, it is common to have pain, soreness, bruising, and tiredness.  Your doctor will tell you how to take care of yourself at home. Change your bandage, take your medicines, and limit your activities as told by your doctor.  Call your doctor if you have symptoms of infection. Get help right away if your belly swells, your cut bleeds a lot, or you have trouble talking or breathing. This information is not intended to replace advice given to you by your health care provider. Make sure you discuss any questions you have with your health care provider. Document Revised: 08/11/2017 Document Reviewed: 08/11/2017 Elsevier Patient Education  [place].  Open Cholecystectomy, Care After This sheet gives you  information about how to care for yourself after your procedure. Your health care provider may also give you more specific instructions. If you have problems or questions, contact your health care provider. What can I expect after the procedure? After the procedure, it is common to have:  Pain at your incision site. You will be given medicines to control this pain.  Mild nausea or vomiting. Follow these instructions at home: Incision care   Follow instructions from your health care provider about how to take care of your incision. Make sure you: ? Wash your  hands with soap and water before you change your bandage (dressing). If soap and water are not available, use hand sanitizer. ? Change your dressing as told by your health care provider. ? Leave stitches (sutures), skin glue, or adhesive strips in place. These skin closures may need to be in place for 2 weeks or longer. If adhesive strip edges start to loosen and curl up, you may trim the loose edges. Do not remove adhesive strips completely unless your health care provider tells you to do that.  Do not take baths, swim, or use a hot tub until your health care provider approves. Ask your health care provider if you can take showers. You may only be allowed to take sponge baths for bathing.  Check your incision area every day for signs of infection. Check for: ? More redness, swelling, or pain. ? More fluid or blood. ? Warmth. ? Pus or a bad smell. Activity  Do not drive or use heavy machinery while taking prescription pain medicine.  Do not lift anything that is heavier than 10 lb (4.5 kg) until your health care provider approves.  Do not play contact sports until your health care provider approves.  Do not drive for 24 hours if you were given a medicine to help you relax (sedative).  Rest as needed. Do not return to work or school until your health care provider approves. General instructions  Take over-the-counter and  prescription medicines only as told by your health care provider.  To prevent or treat constipation while you are taking prescription pain medicine, your health care provider may recommend that you: ? Drink enough fluid to keep your urine clear or pale yellow. ? Take over-the-counter or prescription medicines. ? Eat foods that are high in fiber, such as fresh fruits and vegetables, whole grains, and beans. ? Limit foods that are high in fat and processed sugars, such as fried and sweet foods. Contact a health care provider if:  You develop a rash.  You have more redness, swelling, or pain around your incision.  You have more fluid or blood coming from your incision.  Your incision feels warm to the touch.  You have pus or a bad smell coming from your incision.  You have a fever.  Your incision breaks open. Get help right away if:  You have trouble breathing.  You have chest pain.  You have increasing pain in your shoulders.  You faint or feel dizzy when you stand.  You have severe pain in your abdomen.  You have nausea or vomiting that lasts for more than one day.  You have leg pain. This information is not intended to replace advice given to you by your health care provider. Make sure you discuss any questions you have with your health care provider. Document Revised: 01/25/2019 Document Reviewed: 01/18/2016 Elsevier Patient Education  [place].  Laparoscopic Cholecystectomy, Care After This sheet gives you information about how to care for yourself after your procedure. Your health care provider may also give you more specific instructions. If you have problems or questions, contact your health care provider. What can I expect after the procedure? After the procedure, it is common to have:  Pain at your incision sites. You will be given medicines to control this pain.  Mild nausea or vomiting.  Bloating and possible shoulder pain from the air-like gas  that was used during the procedure. Follow these instructions at home: Incision care   Follow instructions from your health  care provider about how to take care of your incisions. Make sure you: ? Wash your hands with soap and water before you change your bandage (dressing). If soap and water are not available, use hand sanitizer. ? Change your dressing as told by your health care provider. ? Leave stitches (sutures), skin glue, or adhesive strips in place. These skin closures may need to be in place for 2 weeks or longer. If adhesive strip edges start to loosen and curl up, you may trim the loose edges. Do not remove adhesive strips completely unless your health care provider tells you to do that.  Do not take baths, swim, or use a hot tub until your health care provider approves. Ask your health care provider if you can take showers. You may only be allowed to take sponge baths for bathing.  Check your incision area every day for signs of infection. Check for: ? More redness, swelling, or pain. ? More fluid or blood. ? Warmth. ? Pus or a bad smell. Activity  Do not drive or use heavy machinery while taking prescription pain medicine.  Do not lift anything that is heavier than 10 lb (4.5 kg) until your health care provider approves.  Do not play contact sports until your health care provider approves.  Do not drive for 24 hours if you were given a medicine to help you relax (sedative).  Rest as needed. Do not return to work or school until your health care provider approves. General instructions  Take over-the-counter and prescription medicines only as told by your health care provider.  To prevent or treat constipation while you are taking prescription pain medicine, your health care provider may recommend that you: ? Drink enough fluid to keep your urine clear or pale yellow. ? Take over-the-counter or prescription medicines. ? Eat foods that are high in fiber, such as fresh  fruits and vegetables, whole grains, and beans. ? Limit foods that are high in fat and processed sugars, such as fried and sweet foods. Contact a health care provider if:  You develop a rash.  You have more redness, swelling, or pain around your incisions.  You have more fluid or blood coming from your incisions.  Your incisions feel warm to the touch.  You have pus or a bad smell coming from your incisions.  You have a fever.  One or more of your incisions breaks open. Get help right away if:  You have trouble breathing.  You have chest pain.  You have increasing pain in your shoulders.  You faint or feel dizzy when you stand.  You have severe pain in your abdomen.  You have nausea or vomiting that lasts for more than one day.  You have leg pain. This information is not intended to replace advice given to you by your health care provider. Make sure you discuss any questions you have with your health care provider. Document Revised: 07/14/2017 Document Reviewed: 01/18/2016 Elsevier Patient Education  2020 [place] Anesthesia, Adult, Care After This sheet gives you information about how to care for yourself after your procedure. Your health care provider may also give you more specific instructions. If you have problems or questions, contact your health care provider. What can I expect after the procedure? After the procedure, the following side effects are common:  Pain or discomfort at the IV site.  Nausea.  Vomiting.  Sore throat.  Trouble concentrating.  Feeling cold or chills.  Weak or tired.  Sleepiness  and fatigue.  Soreness and body aches. These side effects can affect parts of the body that were not involved in surgery. Follow these instructions at home:  For at least 24 hours after the procedure:  Have a responsible adult stay with you. It is important to have someone help care for you until you are awake and alert.  Rest as  needed.  Do not: ? Participate in activities in which you could fall or become injured. ? Drive. ? Use heavy machinery. ? Drink alcohol. ? Take sleeping pills or medicines that cause drowsiness. ? Make important decisions or sign legal documents. ? Take care of children on your own. Eating and drinking  Follow any instructions from your health care provider about eating or drinking restrictions.  When you feel hungry, start by eating small amounts of foods that are soft and easy to digest (bland), such as toast. Gradually return to your regular diet.  Drink enough fluid to keep your urine pale yellow.  If you vomit, rehydrate by drinking water, juice, or clear broth. General instructions  If you have sleep apnea, surgery and certain medicines can increase your risk for breathing problems. Follow instructions from your health care provider about wearing your sleep device: ? Anytime you are sleeping, including during daytime naps. ? While taking prescription pain medicines, sleeping medicines, or medicines that make you drowsy.  Return to your normal activities as told by your health care provider. Ask your health care provider what activities are safe for you.  Take over-the-counter and prescription medicines only as told by your health care provider.  If you smoke, do not smoke without supervision.  Keep all follow-up visits as told by your health care provider. This is important. Contact a health care provider if:  You have nausea or vomiting that does not get better with medicine.  You cannot eat or drink without vomiting.  You have pain that does not get better with medicine.  You are unable to pass urine.  You develop a skin rash.  You have a fever.  You have redness around your IV site that gets worse. Get help right away if:  You have difficulty breathing.  You have chest pain.  You have blood in your urine or stool, or you vomit blood. Summary  After the  procedure, it is common to have a sore throat or nausea. It is also common to feel tired.  Have a responsible adult stay with you for the first 24 hours after general anesthesia. It is important to have someone help care for you until you are awake and alert.  When you feel hungry, start by eating small amounts of foods that are soft and easy to digest (bland), such as toast. Gradually return to your regular diet.  Drink enough fluid to keep your urine pale yellow.  Return to your normal activities as told by your health care provider. Ask your health care provider what activities are safe for you. This information is not intended to replace advice given to you by your health care provider. Make sure you discuss any questions you have with your health care provider. Document Revised: 08/04/2017 Document Reviewed: 03/17/2017 Elsevier Patient Education  [place]. How to Use Chlorhexidine for Bathing Chlorhexidine gluconate (CHG) is a germ-killing (antiseptic) solution that is used to clean the skin. It can get rid of the bacteria that normally live on the skin and can keep them away for about 24 hours. To clean your skin  with CHG, you may be given:  A CHG solution to use in the shower or as part of a sponge bath.  A prepackaged cloth that contains CHG. Cleaning your skin with CHG may help lower the risk for infection:  While you are staying in the intensive care unit of the hospital.  If you have a vascular access, such as a central line, to provide short-term or long-term access to your veins.  If you have a catheter to drain urine from your bladder.  If you are on a ventilator. A ventilator is a machine that helps you breathe by moving air in and out of your lungs.  After surgery. What are the risks? Risks of using CHG include:  A skin reaction.  Hearing loss, if CHG gets in your ears.  Eye injury, if CHG gets in your eyes and is not rinsed out.  The CHG product  catching fire. Make sure that you avoid smoking and flames after applying CHG to your skin. Do not use CHG:  If you have a chlorhexidine allergy or have previously reacted to chlorhexidine.  On babies younger than 23 months of age. How to use CHG solution  Use CHG only as told by your health care provider, and follow the instructions on the label.  Use the full amount of CHG as directed. Usually, this is one bottle. During a shower Follow these steps when using CHG solution during a shower (unless your health care provider gives you different instructions): 1. Start the shower. 2. Use your normal soap and shampoo to wash your face and hair. 3. Turn off the shower or move out of the shower stream. 4. Pour the CHG onto a clean washcloth. Do not use any type of brush or rough-edged sponge. 5. Starting at your neck, lather your body down to your toes. Make sure you follow these instructions: ? If you will be having surgery, pay special attention to the part of your body where you will be having surgery. Scrub this area for at least 1 minute. ? Do not use CHG on your head or face. If the solution gets into your ears or eyes, rinse them well with water. ? Avoid your genital area. ? Avoid any areas of skin that have broken skin, cuts, or scrapes. ? Scrub your back and under your arms. Make sure to wash skin folds. 6. Let the lather sit on your skin for 1-2 minutes or as long as told by your health care provider. 7. Thoroughly rinse your entire body in the shower. Make sure that all body creases and crevices are rinsed well. 8. Dry off with a clean towel. Do not put any substances on your body afterward--such as powder, lotion, or perfume--unless you are told to do so by your health care provider. Only use lotions that are recommended by the manufacturer. 9. Put on clean clothes or pajamas. 10. If it is the night before your surgery, sleep in clean sheets.  During a sponge bath Follow these  steps when using CHG solution during a sponge bath (unless your health care provider gives you different instructions): 1. Use your normal soap and shampoo to wash your face and hair. 2. Pour the CHG onto a clean washcloth. 3. Starting at your neck, lather your body down to your toes. Make sure you follow these instructions: ? If you will be having surgery, pay special attention to the part of your body where you will be having surgery. Scrub this area  for at least 1 minute. ? Do not use CHG on your head or face. If the solution gets into your ears or eyes, rinse them well with water. ? Avoid your genital area. ? Avoid any areas of skin that have broken skin, cuts, or scrapes. ? Scrub your back and under your arms. Make sure to wash skin folds. 4. Let the lather sit on your skin for 1-2 minutes or as long as told by your health care provider. 5. Using a different clean, wet washcloth, thoroughly rinse your entire body. Make sure that all body creases and crevices are rinsed well. 6. Dry off with a clean towel. Do not put any substances on your body afterward--such as powder, lotion, or perfume--unless you are told to do so by your health care provider. Only use lotions that are recommended by the manufacturer. 7. Put on clean clothes or pajamas. 8. If it is the night before your surgery, sleep in clean sheets. How to use CHG prepackaged cloths  Only use CHG cloths as told by your health care provider, and follow the instructions on the label.  Use the CHG cloth on clean, dry skin.  Do not use the CHG cloth on your head or face unless your health care provider tells you to.  When washing with the CHG cloth: ? Avoid your genital area. ? Avoid any areas of skin that have broken skin, cuts, or scrapes. Before surgery Follow these steps when using a CHG cloth to clean before surgery (unless your health care provider gives you different instructions): 1. Using the CHG cloth, vigorously scrub the  part of your body where you will be having surgery. Scrub using a back-and-forth motion for 3 minutes. The area on your body should be completely wet with CHG when you are done scrubbing. 2. Do not rinse. Discard the cloth and let the area air-dry. Do not put any substances on the area afterward, such as powder, lotion, or perfume. 3. Put on clean clothes or pajamas. 4. If it is the night before your surgery, sleep in clean sheets.  For general bathing Follow these steps when using CHG cloths for general bathing (unless your health care provider gives you different instructions). 1. Use a separate CHG cloth for each area of your body. Make sure you wash between any folds of skin and between your fingers and toes. Wash your body in the following order, switching to a new cloth after each step: ? The front of your neck, shoulders, and chest. ? Both of your arms, under your arms, and your hands. ? Your stomach and groin area, avoiding the genitals. ? Your right leg and foot. ? Your left leg and foot. ? The back of your neck, your back, and your buttocks. 2. Do not rinse. Discard the cloth and let the area air-dry. Do not put any substances on your body afterward--such as powder, lotion, or perfume--unless you are told to do so by your health care provider. Only use lotions that are recommended by the manufacturer. 3. Put on clean clothes or pajamas. Contact a health care provider if:  Your skin gets irritated after scrubbing.  You have questions about using your solution or cloth. Get help right away if:  Your eyes become very red or swollen.  Your eyes itch badly.  Your skin itches badly and is red or swollen.  Your hearing changes.  You have trouble seeing.  You have swelling or tingling in your mouth or throat.  You have trouble breathing.  You swallow any chlorhexidine. Summary  Chlorhexidine gluconate (CHG) is a germ-killing (antiseptic) solution that is used to clean the  skin. Cleaning your skin with CHG may help to lower your risk for infection.  You may be given CHG to use for bathing. It may be in a bottle or in a prepackaged cloth to use on your skin. Carefully follow your health care provider's instructions and the instructions on the product label.  Do not use CHG if you have a chlorhexidine allergy.  Contact your health care provider if your skin gets irritated after scrubbing. This information is not intended to replace advice given to you by your health care provider. Make sure you discuss any questions you have with your health care provider. Document Revised: 10/18/2018 Document Reviewed: 06/29/2017 Elsevier Patient Education  [place].

## 2020-02-10 ENCOUNTER — Other Ambulatory Visit (HOSPITAL_COMMUNITY)
Admission: RE | Admit: 2020-02-10 | Discharge: 2020-02-10 | Disposition: A | Discharge status: Home / Self Care | Payer: Medicare HMO | Source: Ambulatory Visit | Attending: General Surgery | Admitting: General Surgery

## 2020-02-10 ENCOUNTER — Other Ambulatory Visit: Payer: Self-pay

## 2020-02-10 ENCOUNTER — Encounter (HOSPITAL_COMMUNITY): Payer: Self-pay

## 2020-02-10 ENCOUNTER — Encounter (HOSPITAL_COMMUNITY)
Admission: RE | Admit: 2020-02-10 | Discharge: 2020-02-10 | Disposition: A | Discharge status: Home / Self Care | Payer: Medicare HMO | Source: Ambulatory Visit | Attending: General Surgery | Admitting: General Surgery

## 2020-02-10 DIAGNOSIS — Z20822 Contact with and (suspected) exposure to covid-19: Secondary | ICD-10-CM | POA: Diagnosis not present

## 2020-02-10 DIAGNOSIS — Z01812 Encounter for preprocedural laboratory examination: Secondary | ICD-10-CM | POA: Diagnosis not present

## 2020-02-10 HISTORY — DX: Personal history of urinary calculi: Z87.442

## 2020-02-10 HISTORY — DX: Unspecified cirrhosis of liver: K74.60

## 2020-02-10 HISTORY — DX: Atherosclerotic heart disease of native coronary artery without angina pectoris: I25.10

## 2020-02-10 LAB — CBC WITH DIFFERENTIAL/PLATELET
Abs Immature Granulocytes: 0.07 10*3/uL (ref 0.00–0.07)
Basophils Absolute: 0 10*3/uL (ref 0.0–0.1)
Basophils Relative: 0 %
Eosinophils Absolute: 0.3 10*3/uL (ref 0.0–0.5)
Eosinophils Relative: 4 %
HCT: 36.5 % — ABNORMAL LOW (ref 39.0–52.0)
Hemoglobin: 11.8 g/dL — ABNORMAL LOW (ref 13.0–17.0)
Immature Granulocytes: 1 %
Lymphocytes Relative: 24 %
Lymphs Abs: 1.8 10*3/uL (ref 0.7–4.0)
MCH: 28.6 pg (ref 26.0–34.0)
MCHC: 32.3 g/dL (ref 30.0–36.0)
MCV: 88.4 fL (ref 80.0–100.0)
Monocytes Absolute: 0.9 10*3/uL (ref 0.1–1.0)
Monocytes Relative: 12 %
Neutro Abs: 4.3 10*3/uL (ref 1.7–7.7)
Neutrophils Relative %: 59 %
Platelets: 107 10*3/uL — ABNORMAL LOW (ref 150–400)
RBC: 4.13 MIL/uL — ABNORMAL LOW (ref 4.22–5.81)
RDW: 15.3 % (ref 11.5–15.5)
WBC: 7.3 10*3/uL (ref 4.0–10.5)
nRBC: 0 % (ref 0.0–0.2)

## 2020-02-10 LAB — BASIC METABOLIC PANEL
Anion gap: 12 (ref 5–15)
BUN: 30 mg/dL — ABNORMAL HIGH (ref 8–23)
CO2: 25 mmol/L (ref 22–32)
Calcium: 9.5 mg/dL (ref 8.9–10.3)
Chloride: 103 mmol/L (ref 98–111)
Creatinine, Ser: 1.27 mg/dL — ABNORMAL HIGH (ref 0.61–1.24)
GFR calc Af Amer: >60 mL/min (ref >60)
GFR calc non Af Amer: 57 mL/min — ABNORMAL LOW (ref >60)
Glucose, Bld: 178 mg/dL — ABNORMAL HIGH (ref 70–99)
Potassium: 3.9 mmol/L (ref 3.5–5.1)
Sodium: 140 mmol/L (ref 135–145)

## 2020-02-10 LAB — HEMOGLOBIN A1C
Hgb A1c MFr Bld: 7.3 % — ABNORMAL HIGH (ref 4.8–5.6)
Mean Plasma Glucose: 162.81 mg/dL

## 2020-02-10 LAB — SARS CORONAVIRUS 2 (TAT 6-24 HRS): SARS Coronavirus 2: NEGATIVE

## 2020-02-10 LAB — PROTIME-INR
INR: 1.2 (ref 0.8–1.2)
Prothrombin Time: 14.7 seconds (ref 11.4–15.2)

## 2020-02-12 ENCOUNTER — Ambulatory Visit (HOSPITAL_COMMUNITY): Payer: Medicare HMO | Admitting: Certified Registered Nurse Anesthetist

## 2020-02-12 ENCOUNTER — Ambulatory Visit (HOSPITAL_COMMUNITY)
Admission: RE | Admit: 2020-02-12 | Discharge: 2020-02-12 | Disposition: A | Discharge status: Home / Self Care | Payer: Medicare HMO | Attending: General Surgery | Admitting: General Surgery

## 2020-02-12 ENCOUNTER — Encounter (HOSPITAL_COMMUNITY)
Admission: RE | Disposition: A | Discharge status: Home / Self Care | Payer: Self-pay | Source: Home / Self Care | Attending: General Surgery

## 2020-02-12 ENCOUNTER — Encounter (HOSPITAL_COMMUNITY): Payer: Self-pay | Admitting: General Surgery

## 2020-02-12 DIAGNOSIS — E119 Type 2 diabetes mellitus without complications: Secondary | ICD-10-CM | POA: Diagnosis not present

## 2020-02-12 DIAGNOSIS — Z87891 Personal history of nicotine dependence: Secondary | ICD-10-CM | POA: Diagnosis not present

## 2020-02-12 DIAGNOSIS — Z79899 Other long term (current) drug therapy: Secondary | ICD-10-CM | POA: Insufficient documentation

## 2020-02-12 DIAGNOSIS — I2510 Atherosclerotic heart disease of native coronary artery without angina pectoris: Secondary | ICD-10-CM | POA: Diagnosis not present

## 2020-02-12 DIAGNOSIS — K760 Fatty (change of) liver, not elsewhere classified: Secondary | ICD-10-CM | POA: Insufficient documentation

## 2020-02-12 DIAGNOSIS — Z7982 Long term (current) use of aspirin: Secondary | ICD-10-CM | POA: Diagnosis not present

## 2020-02-12 DIAGNOSIS — Z951 Presence of aortocoronary bypass graft: Secondary | ICD-10-CM | POA: Diagnosis not present

## 2020-02-12 DIAGNOSIS — K8020 Calculus of gallbladder without cholecystitis without obstruction: Secondary | ICD-10-CM | POA: Diagnosis present

## 2020-02-12 DIAGNOSIS — K429 Umbilical hernia without obstruction or gangrene: Secondary | ICD-10-CM | POA: Diagnosis not present

## 2020-02-12 DIAGNOSIS — E1165 Type 2 diabetes mellitus with hyperglycemia: Secondary | ICD-10-CM | POA: Diagnosis not present

## 2020-02-12 DIAGNOSIS — I5021 Acute systolic (congestive) heart failure: Secondary | ICD-10-CM | POA: Diagnosis not present

## 2020-02-12 DIAGNOSIS — K420 Umbilical hernia with obstruction, without gangrene: Secondary | ICD-10-CM | POA: Insufficient documentation

## 2020-02-12 DIAGNOSIS — E7849 Other hyperlipidemia: Secondary | ICD-10-CM | POA: Diagnosis not present

## 2020-02-12 DIAGNOSIS — K7460 Unspecified cirrhosis of liver: Secondary | ICD-10-CM | POA: Diagnosis not present

## 2020-02-12 DIAGNOSIS — K8010 Calculus of gallbladder with chronic cholecystitis without obstruction: Secondary | ICD-10-CM | POA: Diagnosis not present

## 2020-02-12 DIAGNOSIS — Z7984 Long term (current) use of oral hypoglycemic drugs: Secondary | ICD-10-CM | POA: Diagnosis not present

## 2020-02-12 HISTORY — PX: UMBILICAL HERNIA REPAIR: SHX196

## 2020-02-12 HISTORY — PX: LIVER BIOPSY: SHX301

## 2020-02-12 HISTORY — PX: CHOLECYSTECTOMY: SHX55

## 2020-02-12 LAB — GLUCOSE, CAPILLARY
Glucose-Capillary: 170 mg/dL — ABNORMAL HIGH (ref 70–99)
Glucose-Capillary: 175 mg/dL — ABNORMAL HIGH (ref 70–99)

## 2020-02-12 SURGERY — LAPAROSCOPIC CHOLECYSTECTOMY
Anesthesia: General

## 2020-02-12 MED ORDER — SUCCINYLCHOLINE CHLORIDE 200 MG/10ML IV SOSY
PREFILLED_SYRINGE | INTRAVENOUS | Status: AC
  Filled 2020-02-12: qty 20

## 2020-02-12 MED ORDER — PROPOFOL 10 MG/ML IV BOLUS
INTRAVENOUS | Status: DC | PRN
  Administered 2020-02-12: 150 mg via INTRAVENOUS

## 2020-02-12 MED ORDER — DOCUSATE SODIUM 100 MG PO CAPS
100.0000 mg | ORAL_CAPSULE | Freq: Two times a day (BID) | ORAL | 2 refills | Status: DC | PRN
Start: 2020-02-12 — End: 2020-03-26

## 2020-02-12 MED ORDER — ROCURONIUM BROMIDE 10 MG/ML (PF) SYRINGE
PREFILLED_SYRINGE | INTRAVENOUS | Status: AC
  Filled 2020-02-12: qty 10

## 2020-02-12 MED ORDER — FENTANYL CITRATE (PF) 250 MCG/5ML IJ SOLN
INTRAMUSCULAR | Status: AC
  Filled 2020-02-12: qty 5

## 2020-02-12 MED ORDER — ORAL CARE MOUTH RINSE: 15.0000 mL | Freq: Once | OROMUCOSAL | Status: AC

## 2020-02-12 MED ORDER — EPHEDRINE 5 MG/ML INJ
INTRAVENOUS | Status: AC
  Filled 2020-02-12: qty 10

## 2020-02-12 MED ORDER — FENTANYL CITRATE (PF) 100 MCG/2ML IJ SOLN
INTRAMUSCULAR | Status: DC | PRN
  Administered 2020-02-12 (×3): 50 ug via INTRAVENOUS
  Administered 2020-02-12: 100 ug via INTRAVENOUS

## 2020-02-12 MED ORDER — PHENYLEPHRINE 40 MCG/ML (10ML) SYRINGE FOR IV PUSH (FOR BLOOD PRESSURE SUPPORT)
PREFILLED_SYRINGE | INTRAVENOUS | Status: AC
  Filled 2020-02-12: qty 10

## 2020-02-12 MED ORDER — CHLORHEXIDINE GLUCONATE CLOTH 2 % EX PADS: 6.0000 | MEDICATED_PAD | Freq: Once | CUTANEOUS | Status: DC

## 2020-02-12 MED ORDER — ROCURONIUM BROMIDE 10 MG/ML (PF) SYRINGE
PREFILLED_SYRINGE | INTRAVENOUS | Status: AC
  Filled 2020-02-12: qty 20

## 2020-02-12 MED ORDER — PROPOFOL 10 MG/ML IV BOLUS
INTRAVENOUS | Status: AC
  Filled 2020-02-12: qty 20

## 2020-02-12 MED ORDER — OXYCODONE HCL 5 MG PO TABS: 5.0000 mg | ORAL_TABLET | ORAL | 0 refills | Status: DC | PRN

## 2020-02-12 MED ORDER — ROCURONIUM BROMIDE 100 MG/10ML IV SOLN
INTRAVENOUS | Status: DC | PRN
  Administered 2020-02-12: 10 mg via INTRAVENOUS
  Administered 2020-02-12: 60 mg via INTRAVENOUS

## 2020-02-12 MED ORDER — BUPIVACAINE HCL (PF) 0.5 % IJ SOLN
INTRAMUSCULAR | Status: AC
  Filled 2020-02-12: qty 30

## 2020-02-12 MED ORDER — HYDROMORPHONE HCL 1 MG/ML IJ SOLN
0.2500 mg | INTRAMUSCULAR | Status: DC | PRN
  Administered 2020-02-12: 0.5 mg via INTRAVENOUS
  Filled 2020-02-12: qty 0.5

## 2020-02-12 MED ORDER — LIDOCAINE 2% (20 MG/ML) 5 ML SYRINGE
INTRAMUSCULAR | Status: AC
  Filled 2020-02-12: qty 5

## 2020-02-12 MED ORDER — PHENYLEPHRINE 40 MCG/ML (10ML) SYRINGE FOR IV PUSH (FOR BLOOD PRESSURE SUPPORT)
PREFILLED_SYRINGE | INTRAVENOUS | Status: DC | PRN
  Administered 2020-02-12 (×3): 40 ug via INTRAVENOUS

## 2020-02-12 MED ORDER — SODIUM CHLORIDE 0.9 % IR SOLN
Status: DC | PRN
  Administered 2020-02-12: 1000 mL
  Administered 2020-02-12: 3000 mL

## 2020-02-12 MED ORDER — ONDANSETRON HCL 4 MG/2ML IJ SOLN
INTRAMUSCULAR | Status: DC | PRN
  Administered 2020-02-12: 4 mg via INTRAVENOUS

## 2020-02-12 MED ORDER — BUPIVACAINE LIPOSOME 1.3 % IJ SUSP
INTRAMUSCULAR | Status: AC
  Filled 2020-02-12: qty 20

## 2020-02-12 MED ORDER — CHLORHEXIDINE GLUCONATE 0.12 % MT SOLN
15.0000 mL | Freq: Once | OROMUCOSAL | Status: AC
  Administered 2020-02-12: 15 mL via OROMUCOSAL

## 2020-02-12 MED ORDER — ONDANSETRON HCL 4 MG PO TABS: 4.0000 mg | ORAL_TABLET | Freq: Three times a day (TID) | ORAL | 1 refills | Status: DC | PRN

## 2020-02-12 MED ORDER — SODIUM CHLORIDE 0.9 % IV SOLN
2.0000 g | INTRAVENOUS | Status: AC
  Administered 2020-02-12: 2 g via INTRAVENOUS

## 2020-02-12 MED ORDER — BUPIVACAINE HCL (PF) 0.5 % IJ SOLN
INTRAMUSCULAR | Status: DC | PRN
  Administered 2020-02-12: 30 mL

## 2020-02-12 MED ORDER — SODIUM CHLORIDE 0.9 % IV SOLN
INTRAVENOUS | Status: AC
  Filled 2020-02-12: qty 2

## 2020-02-12 MED ORDER — HEMOSTATIC AGENTS (NO CHARGE) OPTIME
TOPICAL | Status: DC | PRN
  Administered 2020-02-12 (×2): 1 via TOPICAL

## 2020-02-12 MED ORDER — ONDANSETRON HCL 4 MG/2ML IJ SOLN: 4.0000 mg | Freq: Once | INTRAMUSCULAR | Status: DC | PRN

## 2020-02-12 MED ORDER — SUGAMMADEX SODIUM 200 MG/2ML IV SOLN
INTRAVENOUS | Status: DC | PRN
  Administered 2020-02-12: 250 mg via INTRAVENOUS

## 2020-02-12 MED ORDER — LACTATED RINGERS IV SOLN
INTRAVENOUS | Status: DC
  Administered 2020-02-12: 1000 mL via INTRAVENOUS

## 2020-02-12 SURGICAL SUPPLY — 58 items
ADH SKN CLS APL DERMABOND .7 (GAUZE/BANDAGES/DRESSINGS) ×1
APL PRP STRL LF DISP 70% ISPRP (MISCELLANEOUS) ×1
APL SRG 38 LTWT LNG FL B (MISCELLANEOUS) ×1
APPLICATOR ARISTA FLEXITIP XL (MISCELLANEOUS) ×1 IMPLANT
APPLIER CLIP ROT 10 11.4 M/L (STAPLE) ×2
APR CLP MED LRG 11.4X10 (STAPLE) ×1
BAG RETRIEVAL 10 (BASKET) ×1
BLADE SURG 15 STRL LF DISP TIS (BLADE) ×1 IMPLANT
BLADE SURG 15 STRL SS (BLADE) ×2
CHLORAPREP W/TINT 26 (MISCELLANEOUS) ×2 IMPLANT
CLIP APPLIE ROT 10 11.4 M/L (STAPLE) ×1 IMPLANT
CLOTH BEACON ORANGE TIMEOUT ST (SAFETY) ×2 IMPLANT
COVER LIGHT HANDLE STERIS (MISCELLANEOUS) ×4 IMPLANT
COVER WAND RF STERILE (DRAPES) ×2 IMPLANT
CUTTER FLEX LINEAR 45M (STAPLE) ×1 IMPLANT
DECANTER SPIKE VIAL GLASS SM (MISCELLANEOUS) ×2 IMPLANT
DERMABOND ADVANCED (GAUZE/BANDAGES/DRESSINGS) ×1
DERMABOND ADVANCED .7 DNX12 (GAUZE/BANDAGES/DRESSINGS) ×1 IMPLANT
DRESSING OPSITE X SMALL 2X3 (GAUZE/BANDAGES/DRESSINGS) ×1 IMPLANT
ELECT REM PT RETURN 9FT ADLT (ELECTROSURGICAL) ×2
ELECTRODE REM PT RTRN 9FT ADLT (ELECTROSURGICAL) ×1 IMPLANT
GLOVE BIO SURGEON STRL SZ 6.5 (GLOVE) ×3 IMPLANT
GLOVE BIOGEL M STRL SZ7.5 (GLOVE) ×1 IMPLANT
GLOVE BIOGEL PI IND STRL 6.5 (GLOVE) ×1 IMPLANT
GLOVE BIOGEL PI IND STRL 7.0 (GLOVE) ×3 IMPLANT
GLOVE BIOGEL PI INDICATOR 6.5 (GLOVE) ×2
GLOVE BIOGEL PI INDICATOR 7.0 (GLOVE) ×3
GOWN STRL REUS W/TWL LRG LVL3 (GOWN DISPOSABLE) ×6 IMPLANT
HEMOSTAT ARISTA ABSORB 3G PWDR (HEMOSTASIS) ×1 IMPLANT
HEMOSTAT SNOW SURGICEL 2X4 (HEMOSTASIS) ×2 IMPLANT
INST SET LAPROSCOPIC AP (KITS) ×2 IMPLANT
IV NS IRRIG 3000ML ARTHROMATIC (IV SOLUTION) ×1 IMPLANT
KIT TURNOVER KIT A (KITS) ×2 IMPLANT
LIGASURE IMPACT 36 18CM CVD LR (INSTRUMENTS) ×1 IMPLANT
MANIFOLD NEPTUNE II (INSTRUMENTS) ×2 IMPLANT
NDL INSUFFLATION 14GA 120MM (NEEDLE) ×1 IMPLANT
NEEDLE INSUFFLATION 14GA 120MM (NEEDLE) ×2 IMPLANT
NS IRRIG 1000ML POUR BTL (IV SOLUTION) ×2 IMPLANT
PACK LAP CHOLE LZT030E (CUSTOM PROCEDURE TRAY) ×1 IMPLANT
PAD ARMBOARD 7.5X6 YLW CONV (MISCELLANEOUS) ×2 IMPLANT
RELOAD STAPLE 45 3.5 BLU ETS (ENDOMECHANICALS) IMPLANT
RELOAD STAPLE TA45 3.5 REG BLU (ENDOMECHANICALS) ×2 IMPLANT
SET BASIN LINEN APH (SET/KITS/TRAYS/PACK) ×2 IMPLANT
SET TUBE IRRIG SUCTION NO TIP (IRRIGATION / IRRIGATOR) ×1 IMPLANT
SET TUBE SMOKE EVAC HIGH FLOW (TUBING) ×2 IMPLANT
SLEEVE ENDOPATH XCEL 5M (ENDOMECHANICALS) ×2 IMPLANT
SPONGE GAUZE 2X2 8PLY STRL LF (GAUZE/BANDAGES/DRESSINGS) ×1 IMPLANT
SUT MNCRL AB 4-0 PS2 18 (SUTURE) ×5 IMPLANT
SUT VIC AB 3-0 SH 27 (SUTURE) ×2
SUT VIC AB 3-0 SH 27X BRD (SUTURE) IMPLANT
SUT VICRYL 0 UR6 27IN ABS (SUTURE) ×3 IMPLANT
SYS BAG RETRIEVAL 10MM (BASKET) ×1
SYSTEM BAG RETRIEVAL 10MM (BASKET) ×1 IMPLANT
TROCAR ENDO BLADELESS 11MM (ENDOMECHANICALS) ×2 IMPLANT
TROCAR XCEL NON-BLD 5MMX100MML (ENDOMECHANICALS) ×2 IMPLANT
TROCAR XCEL UNIV SLVE 11M 100M (ENDOMECHANICALS) ×2 IMPLANT
TUBE CONNECTING 12X1/4 (SUCTIONS) ×2 IMPLANT
WARMER LAPAROSCOPE (MISCELLANEOUS) ×2 IMPLANT

## 2020-02-12 NOTE — Addendum Note (Signed)
Addendum  created 02/12/20 1658 by Vista Deck, CRNA   Charge Capture section accepted

## 2020-02-12 NOTE — Anesthesia Preprocedure Evaluation (Signed)
Anesthesia Evaluation  Patient identified by MRN, date of birth, ID band Patient awake    Reviewed: Allergy & Precautions, H&P , NPO status , Patient's Chart, lab work & pertinent test results, reviewed documented beta blocker date and time   Airway Mallampati: II  TM Distance: >3 FB Neck ROM: full    Dental no notable dental hx. (+) Edentulous Upper, Edentulous Lower   Pulmonary pneumonia, resolved, former smoker,    Pulmonary exam normal breath sounds clear to auscultation       Cardiovascular Exercise Tolerance: Good + CAD   Rhythm:regular Rate:Normal     Neuro/Psych negative neurological ROS  negative psych ROS   GI/Hepatic negative GI ROS, Neg liver ROS,   Endo/Other  negative endocrine ROSdiabetes  Renal/GU negative Renal ROS  negative genitourinary   Musculoskeletal   Abdominal   Peds  Hematology negative hematology ROS (+)   Anesthesia Other Findings   Reproductive/Obstetrics negative OB ROS                             Anesthesia Physical Anesthesia Plan  ASA: III  Anesthesia Plan: General   Post-op Pain Management:    Induction:   PONV Risk Score and Plan: Ondansetron  Airway Management Planned:   Additional Equipment:   Intra-op Plan:   Post-operative Plan:   Informed Consent: I have reviewed the patients History and Physical, chart, labs and discussed the procedure including the risks, benefits and alternatives for the proposed anesthesia with the patient or authorized representative who has indicated his/her understanding and acceptance.     Dental Advisory Given  Plan Discussed with: CRNA  Anesthesia Plan Comments:         Anesthesia Quick Evaluation

## 2020-02-12 NOTE — Anesthesia Postprocedure Evaluation (Signed)
Anesthesia Post Note  Patient: Alexander Parks  Procedure(s) Performed: LAPAROSCOPIC CHOLECYSTECTOMY (N/A ) LIVER BIOPSY (N/A ) HERNIA REPAIR UMBILICAL ADULT (N/A )  Patient location during evaluation: PACU Anesthesia Type: General Level of consciousness: awake and alert Pain management: pain level controlled Vital Signs Assessment: post-procedure vital signs reviewed and stable Respiratory status: spontaneous breathing Cardiovascular status: stable Postop Assessment: no apparent nausea or vomiting Anesthetic complications: no   No complications documented.   Last Vitals:  Vitals:   02/12/20 1200 02/12/20 1209  BP: (!) 144/79 (!) 149/78  Pulse: 72 66  Resp: 16 17  Temp:  37 C  SpO2: 92% 93%    Last Pain:  Vitals:   02/12/20 1209  TempSrc: Oral  PainSc: 3                  Gayla Benn Hristova

## 2020-02-12 NOTE — Discharge Instructions (Signed)
Discharge Surgery Instructions:  Common Complaints: Pain at the incision site is common. This will improve with time. Take your pain medications as described below. Some nausea is common and poor appetite. The main goal is to stay hydrated the first few days after surgery.  Right shoulder pain is common after laparoscopic surgery. This is secondary to the gas used in the surgery being trapped under the diaphragm.  Walk to help your body absorb the gas. This will improve in a few days. Pain at the port sites are common, especially the larger port sites. This will improve with time.  Some nausea is common and poor appetite. The main goal is to stay hydrated the first few days after surgery.   Diet/ Activity: Diet as tolerated. You may not have an appetite, but it is important to stay hydrated. Drink 64 ounces of water a day. Your appetite will return with time.  Shower per your regular routine daily.  Do not take hot showers. Take warm showers that are less than 10 minutes. Rest and listen to your body, but do not remain in bed all day.  Walk everyday for at least 15-20 minutes. Deep cough and move around every 1-2 hours in the first few days after surgery.  Do not lift > 10 lbs, perform excessive bending, pushing, pulling, squatting for 6-8 weeks after surgery due to your umbilical hernia repair.  Where a binder to help your hernia from recurring.  In 2 days, remove the gauze and clear tape from your bellly button. If the glue is stuck to the tape or gauze, use scissors to trim the tape and gauze so the glue stays in place. Do not pick at the dermabond glue on your incision sites.  This glue film will remain in place for 1-2 weeks and will start to peel off.  Do not place lotions or balms on your incision unless instructed to specifically by Dr. Constance Haw.   Pain Expectations and Narcotics: -After surgery you will have pain associated with your incisions and this is normal. The pain is muscular and  nerve pain, and will get better with time. -You are encouraged and expected to take non narcotic medications like tylenol and ibuprofen (when able) to treat pain as multiple modalities can aid with pain treatment. -Narcotics are only used when pain is severe or there is breakthrough pain. -You are not expected to have a pain score of 0 after surgery, as we cannot prevent pain. A pain score of 3-4 that allows you to be functional, move, walk, and tolerate some activity is the goal. The pain will continue to improve over the days after surgery and is dependent on your surgery. -Due to [place] law, we are only able to give a certain amount of pain medication to treat post operative pain, and we only give additional narcotics on a patient by patient basis.  -For most laparoscopic surgery, studies have shown that the majority of patients only need 10-15 narcotic pills, and for open surgeries most patients only need 15-20.   -Having appropriate expectations of pain and knowledge of pain management with non narcotics is important as we do not want anyone to become addicted to narcotic pain medication.  -Using ice packs in the first 48 hours and heating pads after 48 hours, wearing an abdominal binder (when recommended), and using over the counter medications are all ways to help with pain management.   -Simple acts like meditation and mindfulness practices after surgery can also help  with pain control and research has proven the benefit of these practices.  Medication: Take tylenol and ibuprofen as needed for pain control, alternating every 4-6 hours.  Example:  Tylenol 1000mg  @ 6am, 12noon, 6pm, 59midnight (Do not exceed 4000mg  of tylenol a day). Ibuprofen 800mg  @ 9am, 3pm, 9pm, 3am (Do not exceed 3600mg  of ibuprofen a day).  Take Roxicodone for breakthrough pain every 4 hours.  Take Colace for constipation related to narcotic pain medication. If you do not have a bowel movement in 2 days, take Miralax over the  counter.  Drink plenty of water to also prevent constipation.   Contact Information: If you have questions or concerns, please call our office, [phone], Monday- Thursday 8AM-5PM and Friday 8AM-12Noon.  If it is after hours or on the weekend, please call [facility], [phone], and ask to speak to the surgeon on call for Dr. Constance Haw at [facility].   Laparoscopic Cholecystectomy, Care After This sheet gives you information about how to care for yourself after your procedure. Your doctor may also give you more specific instructions. If you have problems or questions, contact your doctor. Follow these instructions at home: Care for cuts from surgery (incisions)   Follow instructions from your doctor about how to take care of your cuts from surgery. Make sure you: ? Wash your hands with soap and water before you change your bandage (dressing). If you cannot use soap and water, use hand sanitizer. ? Change your bandage as told by your doctor. ? Leave stitches (sutures), skin glue, or skin tape (adhesive) strips in place. They may need to stay in place for 2 weeks or longer. If tape strips get loose and curl up, you may trim the loose edges. Do not remove tape strips completely unless your doctor says it is okay.  Do not take baths, swim, or use a hot tub until your doctor says it is okay.   You may shower. Do not submerge in tub.  Check your surgical cut area every day for signs of infection. Check for: ? More redness, swelling, or pain. ? More fluid or blood. ? Warmth. ? Pus or a bad smell. Activity  Do not drive or use heavy machinery while taking prescription pain medicine.  Do not lift anything that is heavier than 10 lb (4.5 kg) until your doctor says it is okay.  Do not play contact sports until your doctor says it is okay.  Do not drive for 24 hours if you were given a medicine to help you relax (sedative).  Rest as needed. Do not return to work or school until  your doctor says it is okay. General instructions  Take over-the-counter and prescription medicines only as told by your doctor.  To prevent or treat constipation while you are taking prescription pain medicine, your doctor may recommend that you: ? Drink enough fluid to keep your pee (urine) clear or pale yellow. ? Take over-the-counter or prescription medicines. ? Eat foods that are high in fiber, such as fresh fruits and vegetables, whole grains, and beans. ? Limit foods that are high in fat and processed sugars, such as fried and sweet foods. Contact a doctor if:  You develop a rash.  You have more redness, swelling, or pain around your surgical cuts.  You have more fluid or blood coming from your surgical cuts.  Your surgical cuts feel warm to the touch.  You have pus or a bad smell coming from your surgical cuts.  You  have a fever.  One or more of your surgical cuts breaks open. Get help right away if:  You have trouble breathing.  You have chest pain.  You have pain that is getting worse in your shoulders.  You faint or feel dizzy when you stand.  You have very bad pain in your belly (abdomen).  You are sick to your stomach (nauseous) for more than one day.  You have throwing up (vomiting) that lasts for more than one day.  You have leg pain. This information is not intended to replace advice given to you by your health care provider. Make sure you discuss any questions you have with your health care provider. Document Revised: 07/14/2017 Document Reviewed: 01/18/2016 Elsevier Patient Education  [place].  Liver Biopsy, Care After These instructions give you information on caring for yourself after your procedure. Your doctor may also give you more specific instructions. Call your doctor if you have any problems or questions after your procedure. What can I expect after the procedure? After the procedure, it is common to have:  Pain and soreness  where the biopsy was done.  Bruising around the area where the biopsy was done.  Sleepiness and be tired for a few days. Follow these instructions at home: Medicines  Take over-the-counter and prescription medicines only as told by your doctor.  If you were prescribed an antibiotic medicine, take it as told by your doctor. Do not stop taking the antibiotic even if you start to feel better.  Do not take medicines such as aspirin and ibuprofen. These medicines can thin your blood. Do not take these medicines unless your doctor tells you to take them.  If you are taking prescription pain medicine, take actions to prevent or treat constipation. Your doctor may recommend that you: ? Drink enough fluid to keep your pee (urine) clear or pale yellow. ? Take over-the-counter or prescription medicines. ? Eat foods that are high in fiber, such as fresh fruits and vegetables, whole grains, and beans. ? Limit foods that are high in fat and processed sugars, such as fried and sweet foods. Caring for your cut  Follow instructions from your doctor about how to take care of your cuts from surgery (incisions). Make sure you: ? Wash your hands with soap and water before you change your bandage (dressing). If you cannot use soap and water, use hand sanitizer. ? Change your bandage as told by your doctor. ? Leave stitches (sutures), skin glue, or skin tape (adhesive) strips in place. They may need to stay in place for 2 weeks or longer. If tape strips get loose and curl up, you may trim the loose edges. Do not remove tape strips completely unless your doctor says it is okay.  Check your cuts every day for signs of infection. Check for: ? Redness, swelling, or more pain. ? Fluid or blood. ? Pus or a bad smell. ? Warmth.  Do not take baths, swim, or use a hot tub until your doctor says it is okay to do so. Activity   Rest at home for 1-2 days or as told by your doctor. ? Avoid sitting for a long time  without moving. Get up to take short walks every 1-2 hours.  Return to your normal activities as told by your doctor. Ask what activities are safe for you.  Do not do these things in the first 24 hours: ? Drive. ? Use machinery. ? Take a bath or shower.  Do  not lift more than 10 pounds (4.5 kg) or play contact sports for the first 2 weeks. General instructions   Do not drink alcohol in the first week after the procedure.  Have someone stay with you for at least 24 hours after the procedure.  Get your test results. Ask your doctor or the department that is doing the test: ? When will my results be ready? ? How will I get my results? ? What are my treatment options? ? What other tests do I need? ? What are my next steps?  Keep all follow-up visits as told by your doctor. This is important. Contact a doctor if:  A cut bleeds and leaves more than just a small spot of blood.  A cut is red, puffs up (swells), or hurts more than before.  Fluid or something else comes from a cut.  A cut smells bad.  You have a fever or chills. Get help right away if:  You have swelling, bloating, or pain in your belly (abdomen).  You get dizzy or faint.  You have a rash.  You feel sick to your stomach (nauseous) or throw up (vomit).  You have trouble breathing, feel short of breath, or feel faint.  Your chest hurts.  You have problems talking or seeing.  You have trouble with your balance or moving your arms or legs. Summary  After the procedure, it is common to have pain, soreness, bruising, and tiredness.  Your doctor will tell you how to take care of yourself at home. Change your bandage, take your medicines, and limit your activities as told by your doctor.  Call your doctor if you have symptoms of infection. Get help right away if your belly swells, your cut bleeds a lot, or you have trouble talking or breathing. This information is not intended to replace advice given to you  by your health care provider. Make sure you discuss any questions you have with your health care provider. Document Revised: 08/11/2017 Document Reviewed: 08/11/2017 Elsevier Patient Education  2020 [place] Anesthesia, Adult, Care After This sheet gives you information about how to care for yourself after your procedure. Your health care provider may also give you more specific instructions. If you have problems or questions, contact your health care provider. What can I expect after the procedure? After the procedure, the following side effects are common:  Pain or discomfort at the IV site.  Nausea.  Vomiting.  Sore throat.  Trouble concentrating.  Feeling cold or chills.  Weak or tired.  Sleepiness and fatigue.  Soreness and body aches. These side effects can affect parts of the body that were not involved in surgery. Follow these instructions at home:  For at least 24 hours after the procedure:  Have a responsible adult stay with you. It is important to have someone help care for you until you are awake and alert.  Rest as needed.  Do not: ? Participate in activities in which you could fall or become injured. ? Drive. ? Use heavy machinery. ? Drink alcohol. ? Take sleeping pills or medicines that cause drowsiness. ? Make important decisions or sign legal documents. ? Take care of children on your own. Eating and drinking  Follow any instructions from your health care provider about eating or drinking restrictions.  When you feel hungry, start by eating small amounts of foods that are soft and easy to digest (bland), such as toast. Gradually return to your regular diet.  Drink  enough fluid to keep your urine pale yellow.  If you vomit, rehydrate by drinking water, juice, or clear broth. General instructions  If you have sleep apnea, surgery and certain medicines can increase your risk for breathing problems. Follow instructions from your health  care provider about wearing your sleep device: ? Anytime you are sleeping, including during daytime naps. ? While taking prescription pain medicines, sleeping medicines, or medicines that make you drowsy.  Return to your normal activities as told by your health care provider. Ask your health care provider what activities are safe for you.  Take over-the-counter and prescription medicines only as told by your health care provider.  If you smoke, do not smoke without supervision.  Keep all follow-up visits as told by your health care provider. This is important. Contact a health care provider if:  You have nausea or vomiting that does not get better with medicine.  You cannot eat or drink without vomiting.  You have pain that does not get better with medicine.  You are unable to pass urine.  You develop a skin rash.  You have a fever.  You have redness around your IV site that gets worse. Get help right away if:  You have difficulty breathing.  You have chest pain.  You have blood in your urine or stool, or you vomit blood. Summary  After the procedure, it is common to have a sore throat or nausea. It is also common to feel tired.  Have a responsible adult stay with you for the first 24 hours after general anesthesia. It is important to have someone help care for you until you are awake and alert.  When you feel hungry, start by eating small amounts of foods that are soft and easy to digest (bland), such as toast. Gradually return to your regular diet.  Drink enough fluid to keep your urine pale yellow.  Return to your normal activities as told by your health care provider. Ask your health care provider what activities are safe for you. This information is not intended to replace advice given to you by your health care provider. Make sure you discuss any questions you have with your health care provider. Document Revised: 08/04/2017 Document Reviewed: 03/17/2017 Elsevier  Patient Education  [place].

## 2020-02-12 NOTE — Op Note (Signed)
Operative Note   Preoperative Diagnosis: Symptomatic cholelithiasis, cirrhosis, incarcerated umbilical hernia    Postoperative Diagnosis: Chronic cholecystitis with hydrops, cirrhosis, incarcerated umbilical hernia    Procedure(s) Performed: Laparoscopic cholecystectomy, wedge liver biopsy, primary umbilical hernia repair    Surgeon: Lanell Matar. Constance Haw, MD   Assistants: Aviva Signs, MD    Anesthesia: General endotracheal   Anesthesiologist: Louann Sjogren, MD    Specimens: Gallbladder, liver wedge biopsy    Estimated Blood Loss: Minimal    Blood Replacement: None    Complications: None    Operative Findings:  Umbilical hernia with omental adhesions to the sac incarcerated, gallbladder with hydrops, nodular liver    Procedure: The patient was taken to the operating room and placed supine. General endotracheal anesthesia was induced. Intravenous antibiotics were administered per protocol. An orogastric tube positioned to decompress the stomach. The abdomen was prepared and draped in the usual sterile fashion.    A supraumbilical incision was made and carried down through to the umbilical hernia sac that was not reducible.  The hernia sac had omentum within it and it was carefully opened. The hernia sac and omentum that was adherent were amputated with a Ligasure to ensure hemostasis. The remaining omentum was placed back into the abdomen, and the fascia defect was about 1cm in size. A 11 mm Hasson port was placed and the balloon was inflated.  Pneumoperitoneum to 15 mmHg was achieved with carbon dioxide. The area underlying the trocar and ligated omentum were inspected and without evidence of injury. There was some minor blood clot on the omentum. The remaining trocars were placed under direct vision. Two 5 mm ports were placed in the right abdomen, between the anterior axillary and midclavicular line.  A final 11 mm port was placed through the mid-epigastrium, near the falciform  ligament.   The liver was very nodular and the gallbladder had some chronic adhesion of omentum.  The omental adhesions were taken down with cautery with care. With great care the gallbladder was elevated up, and additional mobilization on the lateral and medial sides with cautery allowed for the gallbladder to be further retracted up and the infundibulum was grasped.    The gallbladder fundus was elevated cephalad and the infundibulum was retracted to the patient's right. What appeared to be the cystic duct coming from the gallbladder was larger in nature, and to ensure this was not actually the common duct, the gallbladder was further mobilized with a dome down approach.  The back wall of the gallbladder was intrahepatic and the gallbladder was entered. Hydrops was noted. From here as much of the back was that could be resected was removed with the remaining gallbladder, and the small portion left on the liver was cauterized to ensure the epithelium was dessicated. Once the gallbladder was mobilized more in a doom down approach. I was better able to skeletonize the gallbladder/cystic duct junction, and the cystic artery.  The critical view of safety was achieved with the gallbladder having 2 structures the duct and artery coming off of it and the hepatic bed cleared with the gallbladder on a pedicle. .    The cystic duct was over 1 cm in size, and given this a standard GIA stapler was placed through the epigastric port site, and the cystic duct was amputated. The cystic artery was doubly clipped and divided. The liver bed was made hemostatic with electrocautery.  From here a wedge liver biopsy was obtained using electrocautery on the edge of the liver.  Final inspection revealed acceptable hemostasis. Arista and Surgical SNOW were placed in the gallbladder bed.  The omentum under the umbilical hernia repair was hemostatic. The specimen were placed in an Endopouch and was retrieved through the epigastric  site. Trocars were removed and pneumoperitoneum was released.  0 Vicryl fascial sutures were used to primarily close the umbilical hernia site where the Hasson port was placed.  The epigastric site was overlying the inferior rib and was not closed.  The umbilicus was tacked down with 3-0 Vicryl to recreate the umbilicus. Skin incisions were closed with 4-0 Monocryl subcuticular sutures and Dermabond. A gauze and Tegaderm were placed over the umbilical hernia site to keep the umbilical skin in place. The patient was awakened from anesthesia and extubated without complication.    Curlene Labrum, MD [facility] [address] [place], [place] [address] [phone] (office)

## 2020-02-12 NOTE — Interval H&P Note (Signed)
History and Physical Interval Note:  02/12/2020 8:43 AM  Alexander Parks  has presented today for surgery, with the diagnosis of Cholelithiasis, cirrhosis, umbilical hernia.  The various methods of treatment have been discussed with the patient and family. After consideration of risks, benefits and other options for treatment, the patient has consented to  Procedure(s): LAPAROSCOPIC CHOLECYSTECTOMY (N/A) LIVER BIOPSY (N/A) as a surgical intervention.  The patient's history has been reviewed, patient examined, no change in status, stable for surgery.  I have reviewed the patient's chart and labs.  Questions were answered to the patient's satisfaction.     Virl Cagey

## 2020-02-12 NOTE — Anesthesia Procedure Notes (Signed)
Procedure Name: Intubation Date/Time: 02/12/2020 9:20 AM Performed by: Hewitt Blade, CRNA Pre-anesthesia Checklist: Patient identified, Emergency Drugs available, Suction available and Patient being monitored Patient Re-evaluated:Patient Re-evaluated prior to induction Oxygen Delivery Method: Circle system utilized Preoxygenation: Pre-oxygenation with 100% oxygen Induction Type: IV induction Ventilation: Mask ventilation without difficulty Laryngoscope Size: Mac and 3 Tube type: Oral Tube size: 7.5 mm Number of attempts: 1 Airway Equipment and Method: Stylet and Oral airway Placement Confirmation: ETT inserted through vocal cords under direct vision,  positive ETCO2 and breath sounds checked- equal and bilateral Secured at: 22 cm Tube secured with: Tape Dental Injury: Teeth and Oropharynx as per pre-operative assessment

## 2020-02-12 NOTE — Anesthesia Postprocedure Evaluation (Signed)
Anesthesia Post Note  Patient: Alexander Parks  Procedure(s) Performed: LAPAROSCOPIC CHOLECYSTECTOMY (N/A ) LIVER BIOPSY (N/A ) HERNIA REPAIR UMBILICAL ADULT (N/A )  Patient location during evaluation: PACU Anesthesia Type: General Level of consciousness: awake and alert and patient cooperative Pain management: satisfactory to patient Vital Signs Assessment: post-procedure vital signs reviewed and stable Respiratory status: spontaneous breathing Cardiovascular status: stable Postop Assessment: no apparent nausea or vomiting Anesthetic complications: no   No complications documented.   Last Vitals:  Vitals:   02/12/20 1200 02/12/20 1209  BP: (!) 144/79 (!) 149/78  Pulse: 72 66  Resp: 16 17  Temp:  37 C  SpO2: 92% 93%    Last Pain:  Vitals:   02/12/20 1209  TempSrc: Oral  PainSc: 3                  Dayven Linsley

## 2020-02-12 NOTE — Progress Notes (Signed)
[facility]  Spoke with [name], surgery completed. Rx sent in. Will see a few weeks. No heavy lifting > 10 lbs, excessive bending, pushing, pulling, or squatting for 6-8 weeks after surgery given the hernia repair. Remove the gauze and tape from belly button in 2 days.   Curlene Labrum, MD [facility] [address] [place], [place] [address] [phone] (office)

## 2020-02-12 NOTE — Transfer of Care (Signed)
Immediate Anesthesia Transfer of Care Note  Patient: Alexander Parks  Procedure(s) Performed: LAPAROSCOPIC CHOLECYSTECTOMY (N/A ) LIVER BIOPSY (N/A ) HERNIA REPAIR UMBILICAL ADULT (N/A )  Patient Location: PACU  Anesthesia Type:General  Level of Consciousness: awake, alert  and oriented  Airway & Oxygen Therapy: Patient Spontanous Breathing and Patient connected to face mask oxygen  Post-op Assessment: Report given to RN and Post -op Vital signs reviewed and stable  Post vital signs: Reviewed and stable  Last Vitals:  Vitals Value Taken Time  BP 146/76 02/12/20 1110  Temp    Pulse 76 02/12/20 1112  Resp 16 02/12/20 1113  SpO2 98 % 02/12/20 1112  Vitals shown include unvalidated device data.  Last Pain: There were no vitals filed for this visit.       Complications: No complications documented.

## 2020-02-12 NOTE — Addendum Note (Signed)
Addendum  created 02/12/20 1229 by Hewitt Blade, CRNA   Clinical Note Signed

## 2020-02-13 ENCOUNTER — Other Ambulatory Visit: Payer: Self-pay

## 2020-02-13 ENCOUNTER — Encounter (HOSPITAL_COMMUNITY): Payer: Self-pay | Admitting: General Surgery

## 2020-02-13 ENCOUNTER — Telehealth: Payer: Self-pay | Admitting: Family Medicine

## 2020-02-13 LAB — SURGICAL PATHOLOGY

## 2020-02-13 NOTE — Progress Notes (Signed)
During post -op call, RN made aware that pt was unable to get pain medications prescribed because pharmacy was out of it, will not have any until Tues. Pt is taking Tylenol for pain. RN told pt he can alternate with Ibuprofen. Verbalized understanding.

## 2020-02-13 NOTE — Telephone Encounter (Signed)
Pt's wife called and states that the patient was running a fever this am of 100.4 and having chills. Normal abd pain for the procedure and no nausea. She was only giving him 400mg  of Tylenol.  Informed pt per Dr. Constance Haw - 1000mg  of Tylenol q 6 hours, take deep breaths q 15-20 mins and if fever does not resolve with tylenol then pt needs to go to the ER. Pt's wife verbalized understanding.

## 2020-02-21 ENCOUNTER — Emergency Department (HOSPITAL_COMMUNITY): Payer: Medicare HMO

## 2020-02-21 ENCOUNTER — Telehealth: Payer: Self-pay | Admitting: Family Medicine

## 2020-02-21 ENCOUNTER — Other Ambulatory Visit: Payer: Self-pay

## 2020-02-21 ENCOUNTER — Emergency Department (HOSPITAL_COMMUNITY)
Admission: EM | Admit: 2020-02-21 | Discharge: 2020-02-21 | Disposition: A | Discharge status: Home / Self Care | Payer: Medicare HMO | Attending: Emergency Medicine | Admitting: Emergency Medicine

## 2020-02-21 ENCOUNTER — Encounter (HOSPITAL_COMMUNITY): Payer: Self-pay

## 2020-02-21 DIAGNOSIS — Z5321 Procedure and treatment not carried out due to patient leaving prior to being seen by health care provider: Secondary | ICD-10-CM | POA: Insufficient documentation

## 2020-02-21 DIAGNOSIS — J9811 Atelectasis: Secondary | ICD-10-CM | POA: Diagnosis not present

## 2020-02-21 DIAGNOSIS — R509 Fever, unspecified: Secondary | ICD-10-CM | POA: Diagnosis not present

## 2020-02-21 LAB — COMPREHENSIVE METABOLIC PANEL
ALT: 101 U/L — ABNORMAL HIGH (ref 0–44)
AST: 59 U/L — ABNORMAL HIGH (ref 15–41)
Albumin: 3 g/dL — ABNORMAL LOW (ref 3.5–5.0)
Alkaline Phosphatase: 183 U/L — ABNORMAL HIGH (ref 38–126)
Anion gap: 10 (ref 5–15)
BUN: 25 mg/dL — ABNORMAL HIGH (ref 8–23)
CO2: 27 mmol/L (ref 22–32)
Calcium: 9 mg/dL (ref 8.9–10.3)
Chloride: 103 mmol/L (ref 98–111)
Creatinine, Ser: 1.35 mg/dL — ABNORMAL HIGH (ref 0.61–1.24)
GFR calc Af Amer: >60 mL/min (ref >60)
GFR calc non Af Amer: 53 mL/min — ABNORMAL LOW (ref >60)
Glucose, Bld: 160 mg/dL — ABNORMAL HIGH (ref 70–99)
Potassium: 3.9 mmol/L (ref 3.5–5.1)
Sodium: 140 mmol/L (ref 135–145)
Total Bilirubin: 1.8 mg/dL — ABNORMAL HIGH (ref 0.3–1.2)
Total Protein: 7.8 g/dL (ref 6.5–8.1)

## 2020-02-21 LAB — CBC WITH DIFFERENTIAL/PLATELET
Abs Immature Granulocytes: 0.09 10*3/uL — ABNORMAL HIGH (ref 0.00–0.07)
Basophils Absolute: 0 10*3/uL (ref 0.0–0.1)
Basophils Relative: 0 %
Eosinophils Absolute: 0.4 10*3/uL (ref 0.0–0.5)
Eosinophils Relative: 4 %
HCT: 34.5 % — ABNORMAL LOW (ref 39.0–52.0)
Hemoglobin: 10.8 g/dL — ABNORMAL LOW (ref 13.0–17.0)
Immature Granulocytes: 1 %
Lymphocytes Relative: 22 %
Lymphs Abs: 2 10*3/uL (ref 0.7–4.0)
MCH: 27.9 pg (ref 26.0–34.0)
MCHC: 31.3 g/dL (ref 30.0–36.0)
MCV: 89.1 fL (ref 80.0–100.0)
Monocytes Absolute: 1 10*3/uL (ref 0.1–1.0)
Monocytes Relative: 11 %
Neutro Abs: 5.5 10*3/uL (ref 1.7–7.7)
Neutrophils Relative %: 62 %
Platelets: 191 10*3/uL (ref 150–400)
RBC: 3.87 MIL/uL — ABNORMAL LOW (ref 4.22–5.81)
RDW: 15 % (ref 11.5–15.5)
WBC: 9 10*3/uL (ref 4.0–10.5)
nRBC: 0 % (ref 0.0–0.2)

## 2020-02-21 LAB — LACTIC ACID, PLASMA: Lactic Acid, Venous: 1.7 mmol/L (ref 0.5–1.9)

## 2020-02-21 NOTE — ED Triage Notes (Signed)
Pt to er, pt states that he is here for a fever, states that he mostly has the fever at night, states that last week he had surgery to have his hernia repaired and his gallbladder out.  States that he tried to call his md, but she didn't call him back.  Pt denies pain, denies abd pain.  Pt states that he also has some night sweats.

## 2020-02-21 NOTE — Telephone Encounter (Signed)
Pt's wife called, LMOVM, stating that the pt is passing some blood in his urine.    Spoke to Dr. Constance Haw and she states that the surgery she performed should not result in hematuria for him to contact his PCP.  Tried to call - no answer and no vm.

## 2020-02-21 NOTE — ED Notes (Signed)
Called for Pt from waiting room. No answer.

## 2020-02-24 NOTE — Telephone Encounter (Signed)
Spoke to pt's wife, pt is not currently there as he has went to the store. She states that she did see where I had called Friday but he had to go to court that morning. He did go to the ER but it was hot and she was waiting in the car and he told her it was taking too long so he left without being seen.  A urine test was not done at the ER however blood work was done. She states that he is feeling much better and has not notice any further blood in urine. I offered an apt with Dr. Constance Haw for tomorrow but she chose to wait for his arrival home before making apt.

## 2020-03-04 ENCOUNTER — Ambulatory Visit: Payer: Medicare HMO | Admitting: General Surgery

## 2020-03-12 ENCOUNTER — Ambulatory Visit: Payer: Self-pay | Admitting: General Surgery

## 2020-03-12 ENCOUNTER — Ambulatory Visit: Payer: Medicare HMO | Admitting: General Surgery

## 2020-03-19 ENCOUNTER — Ambulatory Visit (INDEPENDENT_AMBULATORY_CARE_PROVIDER_SITE_OTHER): Payer: Medicare HMO | Admitting: Gastroenterology

## 2020-03-26 ENCOUNTER — Ambulatory Visit (INDEPENDENT_AMBULATORY_CARE_PROVIDER_SITE_OTHER): Payer: Self-pay | Admitting: General Surgery

## 2020-03-26 ENCOUNTER — Other Ambulatory Visit: Payer: Self-pay

## 2020-03-26 ENCOUNTER — Encounter: Payer: Self-pay | Admitting: General Surgery

## 2020-03-26 VITALS — BP 148/85 | HR 85 | Temp 98.5°F | Resp 16 | Ht 74.0 in | Wt 226.0 lb

## 2020-03-26 DIAGNOSIS — K7460 Unspecified cirrhosis of liver: Secondary | ICD-10-CM

## 2020-03-26 DIAGNOSIS — K8020 Calculus of gallbladder without cholecystitis without obstruction: Secondary | ICD-10-CM

## 2020-03-26 DIAGNOSIS — K429 Umbilical hernia without obstruction or gangrene: Secondary | ICD-10-CM

## 2020-03-26 NOTE — Progress Notes (Signed)
[facility] Note   HPI:  69 y.o. Male presents to clinic for post-op follow-up evaluation after a laparoscopic cholecystectomy, liver biopsy and primary umbilical hernia repair. He did have some blood in his urine post op but I could not explain why this would have been. This resolved. He did go to the ED but left without being seen. LFTs were up a little then but nothing unusual post op. He has been feeling well and has been back to work driving truck. Eating well and no diarrhea.   Review of Systems:  No pain No diarrhea All other review of systems: otherwise negative   Pathology: FINAL MICROSCOPIC DIAGNOSIS:   A. GALLBLADDER, CHOLECYSTECTOMY:  - Chronic cholecystitis and cholelithiasis   B. 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 See comment  - Mild steatosis    COMMENT:   B.   The liver biopsy shows a nodular architecture. Trichrome stain  highlights fibrous septa that divide the parenchyma into small nodules  and reticulin stain highlights the regenerative nodularity. Septa  contain all the expected biliary and vascular structures, which are  histologically unremarkable. A ductular reaction, mild in degree, is  present at the periphery of the septa. Macrovesicular steatosis affects  approximately 10-15% hepatocytes without evidence of steatohepatitis.  There is no significant portal or lobular inflammation. Cholestasis is  not seen. Megamitochondria, and other inclusions are not seen in  hepatocytes. Iron stain shows no stainable iron. PASD stain is negative  for diastase resistant inclusions in hepatocytes.   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   Vital Signs:  BP (!) 148/85   Pulse 85   Temp 98.5 F (36.9 C) (Oral)   Resp 16   Ht 6\' 2"  (1.88 m)   Wt 226 lb (102.5 kg)   SpO2 93%   BMI 29.02 kg/m    Physical Exam:  Physical Exam Vitals reviewed.   Cardiovascular:     Rate and Rhythm: Normal rate.  Pulmonary:     Effort: Pulmonary effort is normal.  Abdominal:     General: There is no distension.     Palpations: Abdomen is soft.     Tenderness: There is no abdominal tenderness.     Hernia: No hernia is present.     Comments: Port sites healing, umbilical site healing, no erythema or drainage  Neurological:     Mental Status: He is alert.      Assessment:  69 y.o. yo Male s/p lap cholecystectomy, liver biopsy showing cirrhosis, and primary umbilical hernia repair.  Plan:  - Following with Dr. Laural Golden for cirrhosis, will see in November  - Umbilical hernia 6 weeks out, ok to have activity as tolerated  - No blood in urine, told to ask PCP if this returns  - Follow up PRN  Future Appointments  Date Time Provider [place]  07/07/2020 11:15 AM Rehman, Mechele Dawley, MD [facility] None    All of the above recommendations were discussed with the patient and patient's family, and all of patient's and family's questions were answered to their expressed satisfaction.  Curlene Labrum, MD [facility] [address] [place], [place] [address] [phone] (office)

## 2020-03-26 NOTE — Patient Instructions (Signed)
Diet and activity as tolerated.

## 2020-04-06 ENCOUNTER — Emergency Department (HOSPITAL_COMMUNITY): Payer: Medicare HMO

## 2020-04-06 ENCOUNTER — Inpatient Hospital Stay (HOSPITAL_COMMUNITY)
Admission: EM | Admit: 2020-04-06 | Discharge: 2020-05-15 | DRG: 207 | Disposition: E | Payer: Medicare HMO | Attending: Pulmonary Disease | Admitting: Pulmonary Disease

## 2020-04-06 ENCOUNTER — Encounter (HOSPITAL_COMMUNITY): Payer: Self-pay

## 2020-04-06 ENCOUNTER — Other Ambulatory Visit: Payer: Self-pay

## 2020-04-06 DIAGNOSIS — I110 Hypertensive heart disease with heart failure: Secondary | ICD-10-CM | POA: Diagnosis present

## 2020-04-06 DIAGNOSIS — Z6825 Body mass index (BMI) 25.0-25.9, adult: Secondary | ICD-10-CM

## 2020-04-06 DIAGNOSIS — I5042 Chronic combined systolic (congestive) and diastolic (congestive) heart failure: Secondary | ICD-10-CM | POA: Diagnosis not present

## 2020-04-06 DIAGNOSIS — R05 Cough: Secondary | ICD-10-CM | POA: Diagnosis not present

## 2020-04-06 DIAGNOSIS — A419 Sepsis, unspecified organism: Secondary | ICD-10-CM | POA: Diagnosis not present

## 2020-04-06 DIAGNOSIS — Z515 Encounter for palliative care: Secondary | ICD-10-CM

## 2020-04-06 DIAGNOSIS — A4151 Sepsis due to Escherichia coli [E. coli]: Secondary | ICD-10-CM | POA: Diagnosis not present

## 2020-04-06 DIAGNOSIS — R6521 Severe sepsis with septic shock: Secondary | ICD-10-CM | POA: Diagnosis not present

## 2020-04-06 DIAGNOSIS — I255 Ischemic cardiomyopathy: Secondary | ICD-10-CM | POA: Diagnosis not present

## 2020-04-06 DIAGNOSIS — U071 COVID-19: Principal | ICD-10-CM | POA: Diagnosis present

## 2020-04-06 DIAGNOSIS — E872 Acidosis: Secondary | ICD-10-CM | POA: Diagnosis not present

## 2020-04-06 DIAGNOSIS — I700 Atherosclerosis of aorta: Secondary | ICD-10-CM | POA: Diagnosis not present

## 2020-04-06 DIAGNOSIS — Z7982 Long term (current) use of aspirin: Secondary | ICD-10-CM

## 2020-04-06 DIAGNOSIS — E870 Hyperosmolality and hypernatremia: Secondary | ICD-10-CM | POA: Diagnosis not present

## 2020-04-06 DIAGNOSIS — K219 Gastro-esophageal reflux disease without esophagitis: Secondary | ICD-10-CM | POA: Diagnosis present

## 2020-04-06 DIAGNOSIS — Z79899 Other long term (current) drug therapy: Secondary | ICD-10-CM | POA: Diagnosis not present

## 2020-04-06 DIAGNOSIS — R918 Other nonspecific abnormal finding of lung field: Secondary | ICD-10-CM | POA: Diagnosis not present

## 2020-04-06 DIAGNOSIS — Z87891 Personal history of nicotine dependence: Secondary | ICD-10-CM | POA: Diagnosis not present

## 2020-04-06 DIAGNOSIS — I5022 Chronic systolic (congestive) heart failure: Secondary | ICD-10-CM | POA: Diagnosis not present

## 2020-04-06 DIAGNOSIS — B349 Viral infection, unspecified: Secondary | ICD-10-CM | POA: Diagnosis not present

## 2020-04-06 DIAGNOSIS — T380X5A Adverse effect of glucocorticoids and synthetic analogues, initial encounter: Secondary | ICD-10-CM | POA: Diagnosis present

## 2020-04-06 DIAGNOSIS — E8881 Metabolic syndrome: Secondary | ICD-10-CM | POA: Diagnosis not present

## 2020-04-06 DIAGNOSIS — Z7984 Long term (current) use of oral hypoglycemic drugs: Secondary | ICD-10-CM

## 2020-04-06 DIAGNOSIS — D6489 Other specified anemias: Secondary | ICD-10-CM | POA: Diagnosis not present

## 2020-04-06 DIAGNOSIS — Z452 Encounter for adjustment and management of vascular access device: Secondary | ICD-10-CM

## 2020-04-06 DIAGNOSIS — J9601 Acute respiratory failure with hypoxia: Secondary | ICD-10-CM | POA: Diagnosis present

## 2020-04-06 DIAGNOSIS — R579 Shock, unspecified: Secondary | ICD-10-CM

## 2020-04-06 DIAGNOSIS — E46 Unspecified protein-calorie malnutrition: Secondary | ICD-10-CM | POA: Diagnosis present

## 2020-04-06 DIAGNOSIS — K7469 Other cirrhosis of liver: Secondary | ICD-10-CM | POA: Diagnosis present

## 2020-04-06 DIAGNOSIS — J1282 Pneumonia due to coronavirus disease 2019: Secondary | ICD-10-CM | POA: Diagnosis present

## 2020-04-06 DIAGNOSIS — E875 Hyperkalemia: Secondary | ICD-10-CM | POA: Diagnosis not present

## 2020-04-06 DIAGNOSIS — J80 Acute respiratory distress syndrome: Secondary | ICD-10-CM | POA: Diagnosis not present

## 2020-04-06 DIAGNOSIS — J9602 Acute respiratory failure with hypercapnia: Secondary | ICD-10-CM | POA: Diagnosis not present

## 2020-04-06 DIAGNOSIS — J155 Pneumonia due to Escherichia coli: Secondary | ICD-10-CM

## 2020-04-06 DIAGNOSIS — J189 Pneumonia, unspecified organism: Secondary | ICD-10-CM | POA: Diagnosis not present

## 2020-04-06 DIAGNOSIS — R69 Illness, unspecified: Secondary | ICD-10-CM | POA: Diagnosis not present

## 2020-04-06 DIAGNOSIS — E782 Mixed hyperlipidemia: Secondary | ICD-10-CM | POA: Diagnosis not present

## 2020-04-06 DIAGNOSIS — Z01818 Encounter for other preprocedural examination: Secondary | ICD-10-CM

## 2020-04-06 DIAGNOSIS — I5021 Acute systolic (congestive) heart failure: Secondary | ICD-10-CM | POA: Diagnosis not present

## 2020-04-06 DIAGNOSIS — E119 Type 2 diabetes mellitus without complications: Secondary | ICD-10-CM

## 2020-04-06 DIAGNOSIS — R0602 Shortness of breath: Secondary | ICD-10-CM

## 2020-04-06 DIAGNOSIS — F419 Anxiety disorder, unspecified: Secondary | ICD-10-CM | POA: Diagnosis not present

## 2020-04-06 DIAGNOSIS — J9690 Respiratory failure, unspecified, unspecified whether with hypoxia or hypercapnia: Secondary | ICD-10-CM

## 2020-04-06 DIAGNOSIS — I10 Essential (primary) hypertension: Secondary | ICD-10-CM | POA: Diagnosis not present

## 2020-04-06 DIAGNOSIS — J984 Other disorders of lung: Secondary | ICD-10-CM | POA: Diagnosis not present

## 2020-04-06 DIAGNOSIS — R509 Fever, unspecified: Secondary | ICD-10-CM | POA: Diagnosis not present

## 2020-04-06 DIAGNOSIS — Z20828 Contact with and (suspected) exposure to other viral communicable diseases: Secondary | ICD-10-CM | POA: Diagnosis not present

## 2020-04-06 DIAGNOSIS — D6959 Other secondary thrombocytopenia: Secondary | ICD-10-CM | POA: Diagnosis not present

## 2020-04-06 DIAGNOSIS — Z66 Do not resuscitate: Secondary | ICD-10-CM | POA: Diagnosis not present

## 2020-04-06 DIAGNOSIS — K5900 Constipation, unspecified: Secondary | ICD-10-CM | POA: Diagnosis not present

## 2020-04-06 DIAGNOSIS — E1165 Type 2 diabetes mellitus with hyperglycemia: Secondary | ICD-10-CM | POA: Diagnosis not present

## 2020-04-06 DIAGNOSIS — J849 Interstitial pulmonary disease, unspecified: Secondary | ICD-10-CM | POA: Diagnosis not present

## 2020-04-06 DIAGNOSIS — K7460 Unspecified cirrhosis of liver: Secondary | ICD-10-CM | POA: Diagnosis not present

## 2020-04-06 DIAGNOSIS — I2510 Atherosclerotic heart disease of native coronary artery without angina pectoris: Secondary | ICD-10-CM | POA: Diagnosis present

## 2020-04-06 DIAGNOSIS — R7989 Other specified abnormal findings of blood chemistry: Secondary | ICD-10-CM | POA: Diagnosis not present

## 2020-04-06 DIAGNOSIS — E11649 Type 2 diabetes mellitus with hypoglycemia without coma: Secondary | ICD-10-CM | POA: Diagnosis not present

## 2020-04-06 DIAGNOSIS — N179 Acute kidney failure, unspecified: Secondary | ICD-10-CM | POA: Diagnosis not present

## 2020-04-06 DIAGNOSIS — E1159 Type 2 diabetes mellitus with other circulatory complications: Secondary | ICD-10-CM | POA: Diagnosis not present

## 2020-04-06 DIAGNOSIS — R1110 Vomiting, unspecified: Secondary | ICD-10-CM | POA: Diagnosis not present

## 2020-04-06 DIAGNOSIS — J811 Chronic pulmonary edema: Secondary | ICD-10-CM | POA: Diagnosis not present

## 2020-04-06 DIAGNOSIS — R0902 Hypoxemia: Secondary | ICD-10-CM

## 2020-04-06 DIAGNOSIS — Z951 Presence of aortocoronary bypass graft: Secondary | ICD-10-CM

## 2020-04-06 DIAGNOSIS — Z9911 Dependence on respirator [ventilator] status: Secondary | ICD-10-CM | POA: Diagnosis not present

## 2020-04-06 DIAGNOSIS — K567 Ileus, unspecified: Secondary | ICD-10-CM

## 2020-04-06 DIAGNOSIS — Z978 Presence of other specified devices: Secondary | ICD-10-CM

## 2020-04-06 DIAGNOSIS — J181 Lobar pneumonia, unspecified organism: Secondary | ICD-10-CM | POA: Diagnosis not present

## 2020-04-06 DIAGNOSIS — L8990 Pressure ulcer of unspecified site, unspecified stage: Secondary | ICD-10-CM | POA: Insufficient documentation

## 2020-04-06 LAB — CBC WITH DIFFERENTIAL/PLATELET
Abs Immature Granulocytes: 0.03 10*3/uL (ref 0.00–0.07)
Band Neutrophils: 3 %
Basophils Absolute: 0 10*3/uL (ref 0.0–0.1)
Basophils Relative: 0 %
Eosinophils Absolute: 0 10*3/uL (ref 0.0–0.5)
Eosinophils Relative: 1 %
HCT: 37.1 % — ABNORMAL LOW (ref 39.0–52.0)
Hemoglobin: 12.1 g/dL — ABNORMAL LOW (ref 13.0–17.0)
Lymphocytes Relative: 24 %
Lymphs Abs: 0.8 10*3/uL (ref 0.7–4.0)
MCH: 28.3 pg (ref 26.0–34.0)
MCHC: 32.6 g/dL (ref 30.0–36.0)
MCV: 86.7 fL (ref 80.0–100.0)
Metamyelocytes Relative: 1 %
Monocytes Absolute: 0.2 10*3/uL (ref 0.1–1.0)
Monocytes Relative: 7 %
Neutro Abs: 2.2 10*3/uL (ref 1.7–7.7)
Neutrophils Relative %: 64 %
Platelets: 76 10*3/uL — ABNORMAL LOW (ref 150–400)
RBC: 4.28 MIL/uL (ref 4.22–5.81)
RDW: 16.7 % — ABNORMAL HIGH (ref 11.5–15.5)
WBC: 3.3 10*3/uL — ABNORMAL LOW (ref 4.0–10.5)
nRBC: 0 % (ref 0.0–0.2)

## 2020-04-06 LAB — LACTIC ACID, PLASMA
Lactic Acid, Venous: 2.3 mmol/L (ref 0.5–1.9)
Lactic Acid, Venous: 1.7 mmol/L (ref 0.5–1.9)

## 2020-04-06 LAB — COMPREHENSIVE METABOLIC PANEL
ALT: 74 U/L — ABNORMAL HIGH (ref 0–44)
AST: 92 U/L — ABNORMAL HIGH (ref 15–41)
Albumin: 3.2 g/dL — ABNORMAL LOW (ref 3.5–5.0)
Alkaline Phosphatase: 68 U/L (ref 38–126)
Anion gap: 11 (ref 5–15)
BUN: 46 mg/dL — ABNORMAL HIGH (ref 8–23)
CO2: 21 mmol/L — ABNORMAL LOW (ref 22–32)
Calcium: 8.4 mg/dL — ABNORMAL LOW (ref 8.9–10.3)
Chloride: 99 mmol/L (ref 98–111)
Creatinine, Ser: 1.73 mg/dL — ABNORMAL HIGH (ref 0.61–1.24)
GFR calc Af Amer: 46 mL/min — ABNORMAL LOW (ref >60)
GFR calc non Af Amer: 39 mL/min — ABNORMAL LOW (ref >60)
Glucose, Bld: 248 mg/dL — ABNORMAL HIGH (ref 70–99)
Potassium: 3.8 mmol/L (ref 3.5–5.1)
Sodium: 131 mmol/L — ABNORMAL LOW (ref 135–145)
Total Bilirubin: 1.5 mg/dL — ABNORMAL HIGH (ref 0.3–1.2)
Total Protein: 7.5 g/dL (ref 6.5–8.1)

## 2020-04-06 LAB — CBG MONITORING, ED
Glucose-Capillary: 248 mg/dL — ABNORMAL HIGH (ref 70–99)
Glucose-Capillary: 258 mg/dL — ABNORMAL HIGH (ref 70–99)
Glucose-Capillary: 314 mg/dL — ABNORMAL HIGH (ref 70–99)

## 2020-04-06 LAB — URINALYSIS, ROUTINE W REFLEX MICROSCOPIC
Bilirubin Urine: NEGATIVE
Glucose, UA: 50 mg/dL — AB
Ketones, ur: NEGATIVE mg/dL
Leukocytes,Ua: NEGATIVE
Nitrite: NEGATIVE
Protein, ur: 100 mg/dL — AB
Specific Gravity, Urine: 1.011 (ref 1.005–1.030)
pH: 5 (ref 5.0–8.0)

## 2020-04-06 LAB — SARS CORONAVIRUS 2 BY RT PCR (HOSPITAL ORDER, PERFORMED IN ~~LOC~~ HOSPITAL LAB): SARS Coronavirus 2: POSITIVE — AB

## 2020-04-06 LAB — LACTATE DEHYDROGENASE: LDH: 344 U/L — ABNORMAL HIGH (ref 98–192)

## 2020-04-06 LAB — TRIGLYCERIDES: Triglycerides: 88 mg/dL (ref <150)

## 2020-04-06 LAB — HIV ANTIBODY (ROUTINE TESTING W REFLEX): HIV Screen 4th Generation wRfx: NONREACTIVE

## 2020-04-06 LAB — D-DIMER, QUANTITATIVE: D-Dimer, Quant: 1.36 ug/mL-FEU — ABNORMAL HIGH (ref 0.00–0.50)

## 2020-04-06 LAB — FERRITIN: Ferritin: 1429 ng/mL — ABNORMAL HIGH (ref 24–336)

## 2020-04-06 LAB — FIBRINOGEN: Fibrinogen: 555 mg/dL — ABNORMAL HIGH (ref 210–475)

## 2020-04-06 LAB — ABO/RH: ABO/RH(D): B NEG

## 2020-04-06 LAB — C-REACTIVE PROTEIN: CRP: 9.8 mg/dL — ABNORMAL HIGH (ref <1.0)

## 2020-04-06 LAB — PROCALCITONIN: Procalcitonin: <0.1 ng/mL

## 2020-04-06 MED ORDER — DEXAMETHASONE SODIUM PHOSPHATE 10 MG/ML IJ SOLN: 10.0000 mg | Freq: Once | INTRAMUSCULAR | Status: DC

## 2020-04-06 MED ORDER — INSULIN ASPART 100 UNIT/ML ~~LOC~~ SOLN
0.0000 [IU] | Freq: Three times a day (TID) | SUBCUTANEOUS | Status: DC
  Administered 2020-04-06: 5 [IU] via SUBCUTANEOUS
  Administered 2020-04-07 – 2020-04-08 (×2): 8 [IU] via SUBCUTANEOUS
  Administered 2020-04-08: 5 [IU] via SUBCUTANEOUS
  Administered 2020-04-08: 15 [IU] via SUBCUTANEOUS
  Administered 2020-04-09: 3 [IU] via SUBCUTANEOUS
  Administered 2020-04-09: 2 [IU] via SUBCUTANEOUS
  Administered 2020-04-09: 5 [IU] via SUBCUTANEOUS
  Administered 2020-04-10 – 2020-04-11 (×4): 3 [IU] via SUBCUTANEOUS
  Administered 2020-04-12: 11 [IU] via SUBCUTANEOUS
  Administered 2020-04-12: 5 [IU] via SUBCUTANEOUS
  Administered 2020-04-12 – 2020-04-13 (×2): 3 [IU] via SUBCUTANEOUS
  Administered 2020-04-13: 11 [IU] via SUBCUTANEOUS
  Filled 2020-04-06: qty 1

## 2020-04-06 MED ORDER — ONDANSETRON HCL 4 MG/2ML IJ SOLN: 4.0000 mg | Freq: Four times a day (QID) | INTRAMUSCULAR | Status: DC | PRN

## 2020-04-06 MED ORDER — GUAIFENESIN-DM 100-10 MG/5ML PO SYRP
10.0000 mL | ORAL_SOLUTION | ORAL | Status: DC | PRN
  Administered 2020-04-15 – 2020-04-16 (×2): 10 mL via ORAL
  Filled 2020-04-06 (×3): qty 10

## 2020-04-06 MED ORDER — ENOXAPARIN SODIUM 40 MG/0.4ML ~~LOC~~ SOLN
40.0000 mg | SUBCUTANEOUS | Status: DC
  Administered 2020-04-06 – 2020-04-10 (×5): 40 mg via SUBCUTANEOUS
  Filled 2020-04-06 (×5): qty 0.4

## 2020-04-06 MED ORDER — AEROCHAMBER PLUS FLO-VU MEDIUM MISC
1.0000 | Freq: Once | Status: AC
  Administered 2020-04-06: 1
  Filled 2020-04-06 (×2): qty 1

## 2020-04-06 MED ORDER — SODIUM CHLORIDE 0.9 % IV SOLN: INTRAVENOUS | Status: AC

## 2020-04-06 MED ORDER — SODIUM CHLORIDE 0.9 % IV BOLUS: 500.0000 mL | Freq: Once | INTRAVENOUS | Status: DC

## 2020-04-06 MED ORDER — SODIUM CHLORIDE 0.9 % IV SOLN
100.0000 mg | INTRAVENOUS | Status: AC
  Administered 2020-04-06: 100 mg via INTRAVENOUS
  Filled 2020-04-06 (×2): qty 20

## 2020-04-06 MED ORDER — ASCORBIC ACID 500 MG PO TABS
500.0000 mg | ORAL_TABLET | Freq: Every day | ORAL | Status: DC
  Administered 2020-04-06 – 2020-04-22 (×17): 500 mg via ORAL
  Filled 2020-04-06 (×17): qty 1

## 2020-04-06 MED ORDER — ACETAMINOPHEN 325 MG PO TABS
650.0000 mg | ORAL_TABLET | Freq: Four times a day (QID) | ORAL | Status: DC | PRN
  Administered 2020-04-12 – 2020-04-14 (×2): 650 mg via ORAL
  Filled 2020-04-06 (×2): qty 2

## 2020-04-06 MED ORDER — ZINC SULFATE 220 (50 ZN) MG PO CAPS
220.0000 mg | ORAL_CAPSULE | Freq: Every day | ORAL | Status: DC
  Administered 2020-04-06 – 2020-04-22 (×17): 220 mg via ORAL
  Filled 2020-04-06 (×17): qty 1

## 2020-04-06 MED ORDER — INSULIN GLARGINE 100 UNIT/ML ~~LOC~~ SOLN
15.0000 [IU] | Freq: Every day | SUBCUTANEOUS | Status: DC
  Administered 2020-04-06: 15 [IU] via SUBCUTANEOUS
  Filled 2020-04-06 (×2): qty 0.15

## 2020-04-06 MED ORDER — IPRATROPIUM-ALBUTEROL 20-100 MCG/ACT IN AERS
1.0000 | INHALATION_SPRAY | Freq: Four times a day (QID) | RESPIRATORY_TRACT | Status: DC
  Administered 2020-04-06 – 2020-04-22 (×55): 1 via RESPIRATORY_TRACT
  Filled 2020-04-06 (×3): qty 4

## 2020-04-06 MED ORDER — SODIUM CHLORIDE 0.9 % IV SOLN
100.0000 mg | Freq: Every day | INTRAVENOUS | Status: DC
  Administered 2020-04-07 – 2020-04-09 (×3): 100 mg via INTRAVENOUS
  Filled 2020-04-06 (×5): qty 20

## 2020-04-06 MED ORDER — INSULIN ASPART 100 UNIT/ML ~~LOC~~ SOLN
4.0000 [IU] | Freq: Three times a day (TID) | SUBCUTANEOUS | Status: DC
  Administered 2020-04-06: 4 [IU] via SUBCUTANEOUS
  Filled 2020-04-06: qty 1

## 2020-04-06 MED ORDER — HYDROCOD POLST-CPM POLST ER 10-8 MG/5ML PO SUER: 5.0000 mL | Freq: Two times a day (BID) | ORAL | Status: DC | PRN

## 2020-04-06 MED ORDER — TRAZODONE HCL 50 MG PO TABS
25.0000 mg | ORAL_TABLET | Freq: Every evening | ORAL | Status: DC | PRN
  Administered 2020-04-08 – 2020-04-11 (×3): 25 mg via ORAL
  Filled 2020-04-06 (×3): qty 1

## 2020-04-06 MED ORDER — BISACODYL 5 MG PO TBEC
5.0000 mg | DELAYED_RELEASE_TABLET | Freq: Every day | ORAL | Status: DC | PRN
  Administered 2020-04-20: 5 mg via ORAL
  Filled 2020-04-06 (×2): qty 1

## 2020-04-06 MED ORDER — ONDANSETRON HCL 4 MG PO TABS
4.0000 mg | ORAL_TABLET | Freq: Four times a day (QID) | ORAL | Status: DC | PRN
  Administered 2020-04-12: 4 mg via ORAL
  Filled 2020-04-06: qty 1

## 2020-04-06 MED ORDER — ALBUTEROL SULFATE HFA 108 (90 BASE) MCG/ACT IN AERS
2.0000 | INHALATION_SPRAY | Freq: Once | RESPIRATORY_TRACT | Status: AC
  Administered 2020-04-06: 2 via RESPIRATORY_TRACT
  Filled 2020-04-06: qty 6.7

## 2020-04-06 MED ORDER — DOCUSATE SODIUM 100 MG PO CAPS
100.0000 mg | ORAL_CAPSULE | Freq: Two times a day (BID) | ORAL | Status: DC | PRN
  Administered 2020-04-20 – 2020-04-21 (×2): 100 mg via ORAL
  Filled 2020-04-06 (×3): qty 1

## 2020-04-06 MED ORDER — DEXAMETHASONE SODIUM PHOSPHATE 10 MG/ML IJ SOLN
10.0000 mg | Freq: Once | INTRAMUSCULAR | Status: AC
  Administered 2020-04-06: 10 mg via INTRAVENOUS
  Filled 2020-04-06: qty 1

## 2020-04-06 MED ORDER — METHYLPREDNISOLONE SODIUM SUCC 125 MG IJ SOLR
0.5000 mg/kg | Freq: Two times a day (BID) | INTRAMUSCULAR | Status: DC
  Administered 2020-04-06 – 2020-04-13 (×14): 51.25 mg via INTRAVENOUS
  Filled 2020-04-06 (×12): qty 2

## 2020-04-06 MED ORDER — CARVEDILOL 3.125 MG PO TABS
3.1250 mg | ORAL_TABLET | Freq: Two times a day (BID) | ORAL | Status: DC
  Administered 2020-04-06 – 2020-04-22 (×32): 3.125 mg via ORAL
  Filled 2020-04-06 (×33): qty 1

## 2020-04-06 MED ORDER — ATORVASTATIN CALCIUM 80 MG PO TABS
80.0000 mg | ORAL_TABLET | Freq: Every day | ORAL | Status: DC
  Administered 2020-04-06 – 2020-04-20 (×15): 80 mg via ORAL
  Filled 2020-04-06: qty 2
  Filled 2020-04-06 (×3): qty 1
  Filled 2020-04-06: qty 2
  Filled 2020-04-06 (×3): qty 1
  Filled 2020-04-06 (×2): qty 2
  Filled 2020-04-06: qty 1
  Filled 2020-04-06: qty 2
  Filled 2020-04-06 (×3): qty 1
  Filled 2020-04-06: qty 2
  Filled 2020-04-06: qty 1

## 2020-04-06 MED ORDER — FUROSEMIDE 40 MG PO TABS
40.0000 mg | ORAL_TABLET | Freq: Every day | ORAL | Status: DC
  Administered 2020-04-07 – 2020-04-08 (×2): 40 mg via ORAL
  Filled 2020-04-06 (×2): qty 1

## 2020-04-06 MED ORDER — PANTOPRAZOLE SODIUM 40 MG PO TBEC
40.0000 mg | DELAYED_RELEASE_TABLET | Freq: Every day | ORAL | Status: DC
  Administered 2020-04-07 – 2020-04-14 (×8): 40 mg via ORAL
  Filled 2020-04-06 (×7): qty 1

## 2020-04-06 MED ORDER — INSULIN ASPART 100 UNIT/ML ~~LOC~~ SOLN
0.0000 [IU] | Freq: Every day | SUBCUTANEOUS | Status: DC
  Administered 2020-04-06: 4 [IU] via SUBCUTANEOUS
  Administered 2020-04-07: 2 [IU] via SUBCUTANEOUS
  Filled 2020-04-06: qty 1

## 2020-04-06 MED ORDER — SODIUM CHLORIDE 0.9 % IV BOLUS
250.0000 mL | Freq: Once | INTRAVENOUS | Status: AC
  Administered 2020-04-06: 250 mL via INTRAVENOUS

## 2020-04-06 MED ORDER — ASPIRIN EC 81 MG PO TBEC
81.0000 mg | DELAYED_RELEASE_TABLET | Freq: Every day | ORAL | Status: DC
  Administered 2020-04-06 – 2020-04-22 (×17): 81 mg via ORAL
  Filled 2020-04-06 (×17): qty 1

## 2020-04-06 NOTE — ED Notes (Signed)
Dr Sabra Heck informed, 2nd IV not obtained.  Pt resting quietly, reports feeling better.

## 2020-04-06 NOTE — ED Notes (Signed)
Resp to given Combivent.

## 2020-04-06 NOTE — H&P (Signed)
History and Physical  [facility]  [name] RXV:[id number] DOB: 03/24/1951 DOA: 03/27/2020  PCP: Manon Hilding, MD  Patient coming from: Home   I have personally briefly reviewed patient's old medical records in [place]  Chief Complaint: Shortness of breath   HPI: Alexander Parks is a 69 y.o. 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  He says that he tested positive this morning at an outside facility and was sent to the emergency department for symptoms of shortness of breath cough and low oxygen saturation and weakness.  He also was having chest discomfort.  His significant other has also tested positive for COVID-19.  ED Course: Patient arrived hypoxic with pulse ox of 78% on room air, temperature 99.8, respirations 32, blood pressure 145/61, he was placed on 3 to 4 L nasal cannula with improvement of pulse ox at 94%.  Sodium 131, glucose 248, BUN 46, creatinine 1.73, ALT 92, total bilirubin 1.5, LDH 344, ferritin 1429, CRP 9.8, lactic acid 2.3.  WBC 3.3, hemoglobin 12.1.,  Platelet count 76.  Chest x-ray shows findings of viral infiltrate and pneumonia.  D-dimer 1.36.  Fibrinogen 555.  Blood cultures x2 taken.  Admission was requested for further management of acute respiratory failure with hypoxia.  Review of Systems: As per HPI otherwise 10 point review of systems negative.   Past Medical History:  Diagnosis Date  . Allergy   . Cirrhosis ([place])   . Coronary artery disease   . Diabetes mellitus without complication ([place])   . History of kidney stones     Past Surgical History:  Procedure Laterality Date  . BALLOON DILATION  08/21/2012   Procedure: BALLOON DILATION;  Surgeon: Marissa Nestle, MD;  Location: [facility];  Service:  Urology;  Laterality: Right;  Balloon Dilation Right Ureter  . BIOPSY  01/17/2020   Procedure: BIOPSY;  Surgeon: Rogene Houston, MD;  [facility];  Service: Endoscopy;;  antral esophageal  . CHOLECYSTECTOMY N/A 02/12/2020   Procedure: LAPAROSCOPIC CHOLECYSTECTOMY;  Surgeon: Virl Cagey, MD;  Location: [facility];  Service: General;  Laterality: N/A;  . COLONOSCOPY WITH PROPOFOL N/A 01/17/2020   Procedure: COLONOSCOPY WITH PROPOFOL;  Surgeon: Rogene Houston, MD;  [facility];  Service: Endoscopy;  Laterality: N/A;  730  . CORONARY ARTERY BYPASS GRAFT N/A 02/08/2018   Procedure: CORONARY ARTERY BYPASS GRAFT times five, LIMA-LAD, SVG-DIAG, SVG-OM, SVG-PD-PL(SEQ), using left internal mammary artery and Endoharvest of Right greater saphenous vein;  Surgeon: Grace Isaac, MD;  Location: [facility];  Service: Open Heart Surgery;  Laterality: N/A;  . CYSTOSCOPY W/ URETERAL STENT PLACEMENT  08/21/2012   Procedure: CYSTOSCOPY WITH RETROGRADE PYELOGRAM/URETERAL STENT PLACEMENT;  Surgeon: Marissa Nestle, MD;  Location: [facility];  Service: Urology;  Laterality: Right;  Cystoscopy with Right Retrograde Pyelogram, Right Ureteral Stent Placement  . ESOPHAGOGASTRODUODENOSCOPY (EGD) WITH PROPOFOL N/A 01/17/2020   Procedure: ESOPHAGOGASTRODUODENOSCOPY (EGD) WITH PROPOFOL;  Surgeon: Rogene Houston, MD;  [facility];  Service: Endoscopy;  Laterality: N/A;  . HERNIA REPAIR    . HOLMIUM LASER APPLICATION  5/0/9326   Procedure: HOLMIUM LASER APPLICATION;  Surgeon: Marissa Nestle, MD;  Location: [facility];  Service: Urology;  Laterality: Right;  Holmium Laser  Application to Right Ureteral Calculus  . INTRAOPERATIVE TRANSESOPHAGEAL ECHOCARDIOGRAM N/A 02/08/2018   Procedure: INTRAOPERATIVE TRANSESOPHAGEAL ECHOCARDIOGRAM;  Surgeon: Grace Isaac, MD;  [facility];  Service: Open Heart Surgery;  Laterality: N/A;  . LIVER BIOPSY N/A 02/12/2020   Procedure: LIVER BIOPSY;  Surgeon:  Virl Cagey, MD;  Location: [facility];  Service: General;  Laterality: N/A;  . POLYPECTOMY  01/17/2020   Procedure: POLYPECTOMY;  Surgeon: Rogene Houston, MD;  [facility];  Service: Endoscopy;;  colon  . RIGHT/LEFT HEART CATH AND CORONARY ANGIOGRAPHY N/A 02/07/2018   Procedure: RIGHT/LEFT HEART CATH AND CORONARY ANGIOGRAPHY;  Surgeon: Burnell Blanks, MD;  Location: [facility];  Service: Cardiovascular;  Laterality: N/A;  . STONE EXTRACTION WITH BASKET  08/21/2012   Procedure: STONE EXTRACTION WITH BASKET;  Surgeon: Marissa Nestle, MD;  Location: [facility];  Service: Urology;  Laterality: Right;  Right Ureteral Stone Pension scheme manager  . UMBILICAL HERNIA REPAIR N/A 02/12/2020   Procedure: HERNIA REPAIR UMBILICAL ADULT;  Surgeon: Virl Cagey, MD;  Location: [facility];  Service: General;  Laterality: N/A;     reports that he quit smoking about 23 years ago. His smoking use included cigarettes. He has a 30.00 pack-year smoking history. He has never used smokeless tobacco. He reports that he does not drink alcohol and does not use drugs.  Allergies  Allergen Reactions  . Benadryl [Diphenhydramine Hcl] Swelling  . Iodinated Diagnostic Agents Other (See Comments)    Unknown  . Morphine And Related Other (See Comments)    Unknown    Family History  Problem Relation Age of Onset  . Cancer Mother   . Cancer Sister   . Liver disease Brother      Prior to Admission medications   Medication Sig Start Date End Date Taking? Authorizing Provider  aspirin EC 81 MG tablet Take 1 tablet (81 mg total) by mouth daily. 01/20/20  Yes Rehman, Mechele Dawley, MD  atorvastatin (LIPITOR) 80 MG tablet Take 1 tablet (80 mg total) by mouth daily at 6 PM. 02/13/18  Yes Tacy Dura, Donielle M, PA-C  carvedilol (COREG) 3.125 MG tablet TAKE 1 TABLET BY MOUTH TWICE DAILY WITH A MEAL Patient taking differently: Take 3.125 mg by mouth 2 (two) times daily with a meal.  12/20/19  Yes Branch,  Alphonse Guild, MD  furosemide (LASIX) 40 MG tablet Take 1 tablet (40 mg total) by mouth daily. 02/13/18  Yes Lars Pinks M, PA-C  lisinopril (ZESTRIL) 2.5 MG tablet Take 1 tablet by mouth once daily Patient taking differently: Take 2.5 mg by mouth daily.  09/13/19  Yes BranchAlphonse Guild, MD  metFORMIN (GLUCOPHAGE-XR) 500 MG 24 hr tablet Take 1,000 mg by mouth 2 (two) times daily.    Yes [provider]  triamcinolone cream (KENALOG) 0.1 % Apply 1 application topically in the morning, at noon, and at bedtime.  12/05/19  Yes [provider]  docusate sodium (COLACE) 100 MG capsule Take 100 mg by mouth 2 (two) times daily as needed. 02/12/20   [provider]  pantoprazole (PROTONIX) 40 MG tablet Take 1 tablet (40 mg total) by mouth daily before breakfast. 01/17/20   Rogene Houston, MD   Physical Exam: Vitals:   04/05/2020 1117 03/19/2020 1119 03/17/2020 1230 04/02/2020 1300  BP:  (!) 145/61 (!) 143/73 (!) 149/77  Pulse:  90 88 87  Resp:  (!) 32 (!) 23 (!) 29  Temp:  99.8 F (37.7 C)  TempSrc:  Oral    SpO2:  (!) 78% 93% 94%  Weight: 102 kg     Height: 6\' 2"  (1.88 m)      Constitutional: awake, alert, NAD, calm, comfortable, flat affect noted.  Eyes: PERRL, lids and conjunctivae normal ENMT: Mucous membranes are moist. Posterior pharynx clear of any exudate or lesions.  Neck: normal, supple, no masses, no thyromegaly Respiratory:  Mild increased work of breathing.   Cardiovascular: Regular rate and rhythm, no murmurs / rubs / gallops. No extremity edema. 2+ pedal pulses. No carotid bruits.  Abdomen: no tenderness, no masses palpated. No hepatosplenomegaly. Bowel sounds positive.  Musculoskeletal: no clubbing / cyanosis. No joint deformity upper and lower extremities. Good ROM, no contractures. Normal muscle tone.  Skin: no rashes, lesions, ulcers. No induration Neurologic: CN 2-12 grossly intact. Sensation intact, DTR normal. Strength 5/5 in all 4.  Psychiatric:  Normal judgment and insight. Alert and oriented x 3. Flat affect.   Labs on Admission: I have personally reviewed following labs and imaging studies  CBC: Recent Labs  Lab 03/23/2020 1159  WBC 3.3*  NEUTROABS 2.2  HGB 12.1*  HCT 37.1*  MCV 86.7  PLT 76*   Basic Metabolic Panel: Recent Labs  Lab 03/16/2020 1159  NA 131*  K 3.8  CL 99  CO2 21*  GLUCOSE 248*  BUN 46*  CREATININE 1.73*  CALCIUM 8.4*   GFR: Estimated Creatinine Clearance: 51.4 mL/min (A) (by C-G formula based on SCr of 1.73 mg/dL (H)). Liver Function Tests: Recent Labs  Lab 03/16/2020 1159  AST 92*  ALT 74*  ALKPHOS 68  BILITOT 1.5*  PROT 7.5  ALBUMIN 3.2*   No results for input(s): LIPASE, AMYLASE in the last 168 hours. No results for input(s): AMMONIA in the last 168 hours. Coagulation Profile: No results for input(s): INR, PROTIME in the last 168 hours. Cardiac Enzymes: No results for input(s): CKTOTAL, CKMB, CKMBINDEX, TROPONINI in the last 168 hours. BNP (last 3 results) No results for input(s): PROBNP in the last 8760 hours. HbA1C: No results for input(s): HGBA1C in the last 72 hours. CBG: No results for input(s): GLUCAP in the last 168 hours. Lipid Profile: Recent Labs    03/30/2020 1159  TRIG 88   Thyroid Function Tests: No results for input(s): TSH, T4TOTAL, FREET4, T3FREE, THYROIDAB in the last 72 hours. Anemia Panel: Recent Labs    04/04/2020 1159  FERRITIN 1,429*   Urine analysis:    Component Value Date/Time   COLORURINE YELLOW 03/18/2020 1125   APPEARANCEUR HAZY (A) 04/13/2020 1125   LABSPEC 1.011 04/11/2020 1125   LABSPEC >=1.030 04/29/2014 1346   PHURINE 5.0 04/01/2020 1125   GLUCOSEU 50 (A) 04/05/2020 1125   GLUCOSEU 500 mg/dL 04/29/2014 1346   HGBUR SMALL (A) 03/15/2020 1125   BILIRUBINUR NEGATIVE 04/03/2020 1125   KETONESUR NEGATIVE 04/14/2020 1125   PROTEINUR 100 (A) 03/25/2020 1125   UROBILINOGEN 0.2 08/09/2012 1128   NITRITE NEGATIVE 03/16/2020 1125    LEUKOCYTESUR NEGATIVE 03/30/2020 1125    Radiological Exams on Admission: No results found.  Assessment/Plan Principal Problem:   Acute respiratory failure with hypoxia (HCC) Active Problems:   Chronic systolic heart failure (HCC)   Coronary artery disease involving native coronary artery of native heart without angina pectoris   Ischemic cardiomyopathy   S/P CABG x 5   Mixed hyperlipidemia   Type 2 diabetes mellitus ([place])   Hepatic cirrhosis (HCC)   Pneumonia due to COVID-19 virus   AKI (acute kidney injury) (  [place])    1. Acute respiratory failure with hypoxia secondary to Covid pneumonia-patient is now requiring 4 L nasal cannula oxygen with improvement in pulse ox to 90%.  He needs inpatient admission for more aggressive therapies.  Start IV remdesivir.  Start Solu-Medrol IV.  Add vitamins and monitor inflammatory markers. 2. Acute kidney injury-suspect prerenal as he takes Lasix daily.  Gently hydrate and follow BMP.  Renally dose medications as appropriate. 3. Hepatic cirrhosis-recently had liver biopsy.  He has follow-up with Dr. Laural Golden for GI.  4. Coronary artery disease status post CABG-resume heart medications. 5. Cardiomyopathy with history of systolic congestive heart failure-his most recent echo showed improvement in LV function to 60%. 6. Type 2 diabetes mellitus-he has presented with hyperglycemia likely steroid-induced.  Continue supplemental sliding scale coverage and CBG monitoring.  Check A1c.  DVT prophylaxis: lovenox  Code Status: Full   Family Communication: call wife  Disposition Plan: TBD  Consults called:   Admission status: INP   Osceola Holian MD [facility] How to contact the [facility] Attending or Consulting provider [place] or covering provider during after hours [place], for this patient?  1. Check the care team in [facility] and look for a) attending/consulting [facility] provider listed and b) the [facility] team listed 2. Log into [email or web address] and use 's  universal password to access. If you do not have the password, please contact the hospital operator. 3. Locate the [facility] provider you are looking for under [facility] and page to a number that you can be directly reached. 4. If you still have difficulty reaching the provider, please page the [facility] (Director on Call) for the Hospitalists listed on amion for assistance.   If 7PM-7AM, please contact night-coverage [email or web address] Password [email or web address]  03/28/2020, 1:58 PM

## 2020-04-06 NOTE — ED Notes (Signed)
Patient eating dinner. Insulin coverage given. While eating dinner, patient oxygen saturations noted to decrease to 82-84% on 2L [place]. Gradually increased oxygen to 5L [place] over 10 minutes with increase in O2 sats to 86% while eating. Strong, wet cough noted. Reported increase in oxygen to oncoming RN.

## 2020-04-06 NOTE — ED Triage Notes (Signed)
Pt reports tested positive for covid this morning.  Was sent to er for sob, cough, low o2 sat and weakness.  Reports chest pain.

## 2020-04-06 NOTE — ED Notes (Addendum)
Date and time results received: 03/30/2020 1642   Test: COVID 19 Critical Value:Positive  Name of Provider Notified: Dr. Wynetta Emery  Orders Received? Or Actions Taken?: No new orders given.

## 2020-04-06 NOTE — ED Provider Notes (Signed)
[facility] EMERGENCY DEPARTMENT Provider Note   CSN: [id number] Arrival date & time: 04/13/2020  1029     History Chief Complaint  Patient presents with  . Alexander Parks    Alexander Parks is a 69 y.o. male.  HPI   This patient is an ill-appearing 69 year old male, history of diabetes coronary disease status post multiple bypass grafting as well as cirrhosis.  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  He ultimately tested positive for Covid today.  He was hypoxic and in respiratory distress thus coming to the emergency department.  The patient is short of breath visibly and tachypneic, level 5 caveat applies secondary to the critical illness.  Past Medical History:  Diagnosis Date  . Allergy   . Cirrhosis ([place])   . Coronary artery disease   . Diabetes mellitus without complication ([place])   . History of kidney stones     Patient Active Problem List   Diagnosis Date Noted  . Umbilical hernia without obstruction and without gangrene 01/23/2020  . Calculus of gallbladder without cholecystitis without obstruction 12/17/2019  . Hepatic cirrhosis ([place]) 12/17/2019  . S/P CABG x 5 02/08/2018  . CAD (coronary artery disease) 02/07/2018  . Chronic systolic heart failure ([place])   . Coronary artery disease involving native coronary artery of native heart without angina pectoris   . Ischemic cardiomyopathy   . Acute systolic heart failure ([place]) 01/10/2018  . Body mass index less than 19, adult 12/08/2017  . Mixed hyperlipidemia 12/08/2017  . Pneumonia due to infectious organism 11/17/2017  . Routine general medical examination at a health care facility 11/17/2017  . Hemoptysis 10/26/2017  . Chills without fever 09/28/2016  . Diarrhea 09/28/2016  . Other malaise and fatigue 09/28/2016  . Obesity with body mass index 30 or greater 10/02/2014  . Pain in joint, shoulder region  12/07/2013  . Contusion of shoulder region 09/02/2013  . Calculus of kidney 04/11/2013  . Balanoposthitis 02/09/2013  . Candidiasis of skin and nails 02/09/2013  . Osteoarthrosis 07/13/2012  . Other abnormal glucose 01/30/2012  . Dysuria 06/22/2011  . Cellulitis and abscess of leg 01/31/2011  . Type 2 or unspecified type diabetes mellitus, uncontrolled 01/31/2011    Past Surgical History:  Procedure Laterality Date  . BALLOON DILATION  08/21/2012   Procedure: BALLOON DILATION;  Surgeon: Marissa Nestle, MD;  Location: [facility];  Service: Urology;  Laterality: Right;  Balloon Dilation Right Ureter  . BIOPSY  01/17/2020   Procedure: BIOPSY;  Surgeon: Rogene Houston, MD;  Location: [facility];  Service: Endoscopy;;  antral esophageal  . CHOLECYSTECTOMY N/A 02/12/2020   Procedure: LAPAROSCOPIC CHOLECYSTECTOMY;  Surgeon: Virl Cagey, MD;  Location: [facility];  Service: General;  Laterality: N/A;  . COLONOSCOPY WITH PROPOFOL N/A 01/17/2020   Procedure: COLONOSCOPY WITH PROPOFOL;  Surgeon: Rogene Houston, MD;  [facility]: [facility];  Service: Endoscopy;  Laterality: N/A;  730  . CORONARY ARTERY BYPASS GRAFT N/A 02/08/2018   Procedure: CORONARY ARTERY BYPASS GRAFT times five, LIMA-LAD, SVG-DIAG, SVG-OM, SVG-PD-PL(SEQ), using left internal mammary artery and Endoharvest of Right greater saphenous vein;  Surgeon: Grace Isaac, MD;  Location: [place];  Service: Open Heart Surgery;  Laterality: N/A;  . CYSTOSCOPY W/ URETERAL STENT PLACEMENT  08/21/2012   Procedure: CYSTOSCOPY WITH RETROGRADE PYELOGRAM/URETERAL STENT PLACEMENT;  Surgeon: Silvano Rusk  Michela Pitcher, MD;  Location: [facility];  Service: Urology;  Laterality: Right;  Cystoscopy with Right Retrograde Pyelogram, Right Ureteral Stent Placement  . ESOPHAGOGASTRODUODENOSCOPY (EGD) WITH PROPOFOL N/A 01/17/2020   Procedure: ESOPHAGOGASTRODUODENOSCOPY (EGD) WITH PROPOFOL;  Surgeon: Rogene Houston, MD;  [facility];  Service: Endoscopy;   Laterality: N/A;  . HERNIA REPAIR    . HOLMIUM LASER APPLICATION  08/15/9145   Procedure: HOLMIUM LASER APPLICATION;  Surgeon: Marissa Nestle, MD;  Location: [facility];  Service: Urology;  Laterality: Right;  Holmium Laser Application to Right Ureteral Calculus  . INTRAOPERATIVE TRANSESOPHAGEAL ECHOCARDIOGRAM N/A 02/08/2018   Procedure: INTRAOPERATIVE TRANSESOPHAGEAL ECHOCARDIOGRAM;  Surgeon: Grace Isaac, MD;  Location: [facility];  Service: Open Heart Surgery;  Laterality: N/A;  . LIVER BIOPSY N/A 02/12/2020   Procedure: LIVER BIOPSY;  Surgeon: Virl Cagey, MD;  Location: [facility];  Service: General;  Laterality: N/A;  . POLYPECTOMY  01/17/2020   Procedure: POLYPECTOMY;  Surgeon: Rogene Houston, MD;  [facility];  Service: Endoscopy;;  colon  . RIGHT/LEFT HEART CATH AND CORONARY ANGIOGRAPHY N/A 02/07/2018   Procedure: RIGHT/LEFT HEART CATH AND CORONARY ANGIOGRAPHY;  Surgeon: Burnell Blanks, MD;  Location: [place];  Service: Cardiovascular;  Laterality: N/A;  . STONE EXTRACTION WITH BASKET  08/21/2012   Procedure: STONE EXTRACTION WITH BASKET;  Surgeon: Marissa Nestle, MD;  Location: [facility];  Service: Urology;  Laterality: Right;  Right Ureteral Stone Pension scheme manager  . UMBILICAL HERNIA REPAIR N/A 02/12/2020   Procedure: HERNIA REPAIR UMBILICAL ADULT;  Surgeon: Virl Cagey, MD;  Location: [facility];  Service: General;  Laterality: N/A;       Family History  Problem Relation Age of Onset  . Cancer Mother   . Cancer Sister   . Liver disease Brother     Social History   Tobacco Use  . Smoking status: Former Smoker    Packs/day: 2.00    Years: 15.00    Pack years: 30.00    Types: Cigarettes    Quit date: 08/16/1996    Years since quitting: 23.6  . Smokeless tobacco: Never Used  Substance Use Topics  . Alcohol use: No  . Drug use: No    Home Medications Prior to Admission medications   Medication Sig Start Date End Date Taking? Authorizing  Provider  aspirin EC 81 MG tablet Take 1 tablet (81 mg total) by mouth daily. 01/20/20   Rogene Houston, MD  atorvastatin (LIPITOR) 80 MG tablet Take 1 tablet (80 mg total) by mouth daily at 6 PM. 02/13/18   Nani Skillern, PA-C  carvedilol (COREG) 3.125 MG tablet TAKE 1 TABLET BY MOUTH TWICE DAILY WITH A MEAL Patient taking differently: Take 3.125 mg by mouth 2 (two) times daily with a meal.  12/20/19   Branch, Alphonse Guild, MD  furosemide (LASIX) 40 MG tablet Take 1 tablet (40 mg total) by mouth daily. 02/13/18   Nani Skillern, PA-C  lisinopril (ZESTRIL) 2.5 MG tablet Take 1 tablet by mouth once daily Patient taking differently: Take 2.5 mg by mouth daily.  09/13/19   Arnoldo Lenis, MD  metFORMIN (GLUCOPHAGE-XR) 500 MG 24 hr tablet Take 1,000 mg by mouth 2 (two) times daily.     [provider]  pantoprazole (PROTONIX) 40 MG tablet Take 1 tablet (40 mg total) by mouth daily before breakfast. 01/17/20   Rehman, Mechele Dawley, MD  triamcinolone cream (KENALOG) 0.1 % Apply 1 application  topically in the morning, at noon, and at bedtime.  12/05/19   [provider]    Allergies    Benadryl [diphenhydramine hcl], Iodinated diagnostic agents, and Morphine and related  Review of Systems   Review of Systems  All other systems reviewed and are negative.   Physical Exam Updated Vital Signs BP (!) 145/61 (BP Location: Left Arm)   Pulse 90   Temp 99.8 F (37.7 C) (Oral)   Resp (!) 32   Ht 1.88 m (6\' 2" )   Wt 102 kg   SpO2 (!) 78%   BMI 28.87 kg/m   Physical Exam Vitals and nursing note reviewed.  Constitutional:      General: He is in acute distress.     Appearance: He is well-developed.  HENT:     Head: Normocephalic and atraumatic.     Mouth/Throat:     Mouth: Mucous membranes are dry.     Pharynx: No oropharyngeal exudate.  Eyes:     General: No scleral icterus.       Right eye: No discharge.        Left eye: No discharge.     Conjunctiva/sclera:  Conjunctivae normal.     Pupils: Pupils are equal, round, and reactive to light.  Neck:     Thyroid: No thyromegaly.     Vascular: No JVD.  Cardiovascular:     Rate and Rhythm: Regular rhythm.     Heart sounds: Normal heart sounds. No murmur heard.  No friction rub. No gallop.   Pulmonary:     Effort: Respiratory distress present.     Breath sounds: Rales present. No wheezing.     Comments: Increased work of breathing, tachypnea, rales at the bilateral bases Abdominal:     General: Bowel sounds are normal. There is no distension.     Palpations: Abdomen is soft. There is no mass.     Tenderness: There is no abdominal tenderness.  Musculoskeletal:        General: No tenderness. Normal range of motion.     Cervical back: Normal range of motion and neck supple.  Lymphadenopathy:     Cervical: No cervical adenopathy.  Skin:    General: Skin is warm and dry.     Findings: No erythema or rash.  Neurological:     Mental Status: He is alert.     Coordination: Coordination normal.     Comments: Moves all 4 extremities but generally weak and needs assistance sitting up in bed  Psychiatric:        Behavior: Behavior normal.     ED Results / Procedures / Treatments   Labs (all labs ordered are listed, but only abnormal results are displayed) Labs Reviewed  LACTIC ACID, PLASMA - Abnormal; Notable for the following components:      Result Value   Lactic Acid, Venous 2.3 (*)    All other components within normal limits  CBC WITH DIFFERENTIAL/PLATELET - Abnormal; Notable for the following components:   WBC 3.3 (*)    Hemoglobin 12.1 (*)    HCT 37.1 (*)    RDW 16.7 (*)    Platelets 76 (*)    All other components within normal limits  COMPREHENSIVE METABOLIC PANEL - Abnormal; Notable for the following components:   Sodium 131 (*)    CO2 21 (*)    Glucose, Bld 248 (*)    BUN 46 (*)    Creatinine, Ser 1.73 (*)    Calcium 8.4 (*)  Albumin 3.2 (*)    AST 92 (*)    ALT 74 (*)     Total Bilirubin 1.5 (*)    GFR calc non Af Amer 39 (*)    GFR calc Af Amer 46 (*)    All other components within normal limits  D-DIMER, QUANTITATIVE (NOT AT [facility]) - Abnormal; Notable for the following components:   D-Dimer, Quant 1.36 (*)    All other components within normal limits  LACTATE DEHYDROGENASE - Abnormal; Notable for the following components:   LDH 344 (*)    All other components within normal limits  FERRITIN - Abnormal; Notable for the following components:   Ferritin 1,429 (*)    All other components within normal limits  FIBRINOGEN - Abnormal; Notable for the following components:   Fibrinogen 555 (*)    All other components within normal limits  C-REACTIVE PROTEIN - Abnormal; Notable for the following components:   CRP 9.8 (*)    All other components within normal limits  URINALYSIS, ROUTINE W REFLEX MICROSCOPIC - Abnormal; Notable for the following components:   APPearance HAZY (*)    Glucose, UA 50 (*)    Hgb urine dipstick SMALL (*)    Protein, ur 100 (*)    Bacteria, UA RARE (*)    All other components within normal limits  CULTURE, BLOOD (ROUTINE X 2)  CULTURE, BLOOD (ROUTINE X 2)  SARS CORONAVIRUS 2 BY RT PCR (HOSPITAL ORDER, [place] [facility])  TRIGLYCERIDES  LACTIC ACID, PLASMA  PROCALCITONIN    EKG EKG Interpretation  Date/Time:  Monday April 06 2020 11:21:44 EDT Ventricular Rate:  89 PR Interval:    QRS Duration: 134 QT Interval:  387 QTC Calculation: 471 R Axis:   -15 Text Interpretation: Sinus rhythm Prolonged PR interval Right bundle branch block since last tracing no significant change Confirmed by Noemi Chapel [phone]) on 03/30/2020 12:33:47 PM   Radiology No results found.  Procedures .Critical Care Performed by: Noemi Chapel, MD Authorized by: Noemi Chapel, MD   Critical care provider statement:    Critical care time (minutes):  35   Critical care time was exclusive of:  Separately billable procedures  and treating other patients and teaching time   Critical care was necessary to treat or prevent imminent or life-threatening deterioration of the following conditions:  Respiratory failure and sepsis   Critical care was time spent personally by me on the following activities:  Blood draw for specimens, development of treatment plan with patient or surrogate, discussions with consultants, evaluation of patient's response to treatment, examination of patient, obtaining history from patient or surrogate, ordering and performing treatments and interventions, ordering and review of laboratory studies, ordering and review of radiographic studies, pulse oximetry, re-evaluation of patient's condition and review of old charts   (including critical care time)  Medications Ordered in ED Medications  dexamethasone (DECADRON) injection 10 mg (has no administration in time range)  albuterol (VENTOLIN HFA) 108 (90 Base) MCG/ACT inhaler 2 puff (has no administration in time range)  AeroChamber Plus Flo-Vu Medium MISC 1 each (has no administration in time range)    ED Course  I have reviewed the triage vital signs and the nursing notes.  Pertinent labs & imaging results that were available during my care of the patient were reviewed by me and considered in my medical decision making (see chart for details).  Clinical Course as of Apr 06 1341  Mon Apr 06, 2020  1256 The patient's  laboratory work-up does show that he has some slight renal dysfunction compared to baseline, mild transaminitis, slight elevation in lactic acid and multiple inflammatory markers which are elevated, x-rays consistent with classic Covid pneumonia, the patient would need to be admitted due to hypoxia and oxygen need.  Hospitalist paged   [BM]    Clinical Course User Index [BM] Noemi Chapel, MD   MDM Rules/Calculators/A&P                          This patient is hypoxic, tachypneic, severely weak, both he and his spouse have  COVID-46 and I suspect that he has bilateral Covid pneumonia.  With his hypoxia and respiratory distress and borderline fever at 99.8 I suspect he is septic from this though it is likely a viral cause.  Antibiotics will be withheld at this point pending x-ray and lab work.  Decadron and albuterol inhaler will be given to treat for respiratory distress in the presence of Covid, significant other not nearly as ill, patient will likely need to be admitted to the hospital to high level of care.  Critical illness and critical care provided.  IV fluids at 30 cc/kg and antibiotics are being held at this time as the patient is not hypotensive and this is likely a viral process  Pt has continued to do well on O2 - but still in distress Needs admit for hypoxic respiratory failure  KYNDAL GLOSTER was evaluated in Emergency Department on 03/27/2020 for the symptoms described in the history of present illness. He was evaluated in the context of the global COVID-19 pandemic, which necessitated consideration that the patient might be at risk for infection with the SARS-CoV-2 virus that causes COVID-19. Institutional protocols and algorithms that pertain to the evaluation of patients at risk for COVID-19 are in a state of rapid change based on information released by regulatory bodies including the CDC and federal and state organizations. These policies and algorithms were followed during the patient's care in the ED.   Final Clinical Impression(s) / ED Diagnoses Final diagnoses:  Acute respiratory failure with hypoxia ([place])  Pneumonia due to COVID-19 virus      Noemi Chapel, MD 04/07/2020 1343

## 2020-04-06 NOTE — ED Notes (Signed)
[name] (wife) [phone] cell, Home [phone]

## 2020-04-07 ENCOUNTER — Other Ambulatory Visit: Payer: Self-pay

## 2020-04-07 DIAGNOSIS — I2510 Atherosclerotic heart disease of native coronary artery without angina pectoris: Secondary | ICD-10-CM

## 2020-04-07 DIAGNOSIS — U071 COVID-19: Secondary | ICD-10-CM | POA: Diagnosis present

## 2020-04-07 LAB — GLUCOSE, CAPILLARY
Glucose-Capillary: 274 mg/dL — ABNORMAL HIGH (ref 70–99)
Glucose-Capillary: 243 mg/dL — ABNORMAL HIGH (ref 70–99)

## 2020-04-07 LAB — CBG MONITORING, ED
Glucose-Capillary: 322 mg/dL — ABNORMAL HIGH (ref 70–99)
Glucose-Capillary: 426 mg/dL — ABNORMAL HIGH (ref 70–99)
Glucose-Capillary: 368 mg/dL — ABNORMAL HIGH (ref 70–99)

## 2020-04-07 LAB — CBC WITH DIFFERENTIAL/PLATELET
Abs Immature Granulocytes: 0.03 10*3/uL (ref 0.00–0.07)
Basophils Absolute: 0 10*3/uL (ref 0.0–0.1)
Basophils Relative: 0 %
Eosinophils Absolute: 0 10*3/uL (ref 0.0–0.5)
Eosinophils Relative: 0 %
HCT: 37 % — ABNORMAL LOW (ref 39.0–52.0)
Hemoglobin: 11.8 g/dL — ABNORMAL LOW (ref 13.0–17.0)
Immature Granulocytes: 1 %
Lymphocytes Relative: 24 %
Lymphs Abs: 0.8 10*3/uL (ref 0.7–4.0)
MCH: 27.6 pg (ref 26.0–34.0)
MCHC: 31.9 g/dL (ref 30.0–36.0)
MCV: 86.4 fL (ref 80.0–100.0)
Monocytes Absolute: 0.2 10*3/uL (ref 0.1–1.0)
Monocytes Relative: 7 %
Neutro Abs: 2.4 10*3/uL (ref 1.7–7.7)
Neutrophils Relative %: 68 %
Platelets: 76 10*3/uL — ABNORMAL LOW (ref 150–400)
RBC: 4.28 MIL/uL (ref 4.22–5.81)
RDW: 16.4 % — ABNORMAL HIGH (ref 11.5–15.5)
WBC: 3.5 10*3/uL — ABNORMAL LOW (ref 4.0–10.5)
nRBC: 0 % (ref 0.0–0.2)

## 2020-04-07 LAB — MAGNESIUM: Magnesium: 2 mg/dL (ref 1.7–2.4)

## 2020-04-07 LAB — COMPREHENSIVE METABOLIC PANEL
ALT: 96 U/L — ABNORMAL HIGH (ref 0–44)
AST: 111 U/L — ABNORMAL HIGH (ref 15–41)
Albumin: 2.8 g/dL — ABNORMAL LOW (ref 3.5–5.0)
Alkaline Phosphatase: 67 U/L (ref 38–126)
Anion gap: 9 (ref 5–15)
BUN: 40 mg/dL — ABNORMAL HIGH (ref 8–23)
CO2: 19 mmol/L — ABNORMAL LOW (ref 22–32)
Calcium: 8.3 mg/dL — ABNORMAL LOW (ref 8.9–10.3)
Chloride: 104 mmol/L (ref 98–111)
Creatinine, Ser: 1.25 mg/dL — ABNORMAL HIGH (ref 0.61–1.24)
GFR calc Af Amer: >60 mL/min (ref >60)
GFR calc non Af Amer: 58 mL/min — ABNORMAL LOW (ref >60)
Glucose, Bld: 356 mg/dL — ABNORMAL HIGH (ref 70–99)
Potassium: 4.1 mmol/L (ref 3.5–5.1)
Sodium: 132 mmol/L — ABNORMAL LOW (ref 135–145)
Total Bilirubin: 1 mg/dL (ref 0.3–1.2)
Total Protein: 6.8 g/dL (ref 6.5–8.1)

## 2020-04-07 LAB — PHOSPHORUS: Phosphorus: 3.1 mg/dL (ref 2.5–4.6)

## 2020-04-07 LAB — FERRITIN: Ferritin: 1566 ng/mL — ABNORMAL HIGH (ref 24–336)

## 2020-04-07 LAB — HEMOGLOBIN A1C
Hgb A1c MFr Bld: 7.9 % — ABNORMAL HIGH (ref 4.8–5.6)
Mean Plasma Glucose: 180 mg/dL

## 2020-04-07 LAB — D-DIMER, QUANTITATIVE: D-Dimer, Quant: 1.03 ug/mL-FEU — ABNORMAL HIGH (ref 0.00–0.50)

## 2020-04-07 LAB — C-REACTIVE PROTEIN: CRP: 9 mg/dL — ABNORMAL HIGH (ref <1.0)

## 2020-04-07 MED ORDER — INSULIN ASPART 100 UNIT/ML ~~LOC~~ SOLN
8.0000 [IU] | Freq: Three times a day (TID) | SUBCUTANEOUS | Status: DC
  Administered 2020-04-07: 8 [IU] via SUBCUTANEOUS
  Filled 2020-04-07 (×2): qty 1

## 2020-04-07 MED ORDER — INSULIN GLARGINE 100 UNIT/ML ~~LOC~~ SOLN
24.0000 [IU] | Freq: Every day | SUBCUTANEOUS | Status: DC
  Administered 2020-04-07: 24 [IU] via SUBCUTANEOUS
  Filled 2020-04-07 (×2): qty 0.24

## 2020-04-07 MED ORDER — BARICITINIB 2 MG PO TABS
4.0000 mg | ORAL_TABLET | Freq: Every day | ORAL | Status: AC
  Administered 2020-04-07 – 2020-04-20 (×14): 4 mg via ORAL
  Filled 2020-04-07 (×18): qty 2

## 2020-04-07 MED ORDER — CHLORHEXIDINE GLUCONATE CLOTH 2 % EX PADS
6.0000 | MEDICATED_PAD | Freq: Every day | CUTANEOUS | Status: DC
  Administered 2020-04-07 – 2020-04-12 (×6): 6 via TOPICAL

## 2020-04-07 MED ORDER — INSULIN DETEMIR 100 UNIT/ML ~~LOC~~ SOLN
15.0000 [IU] | Freq: Two times a day (BID) | SUBCUTANEOUS | Status: DC
  Administered 2020-04-07 – 2020-04-08 (×2): 15 [IU] via SUBCUTANEOUS
  Filled 2020-04-07 (×4): qty 0.15

## 2020-04-07 MED ORDER — INSULIN GLARGINE 100 UNIT/ML ~~LOC~~ SOLN
30.0000 [IU] | Freq: Every day | SUBCUTANEOUS | Status: DC
  Filled 2020-04-07: qty 0.3

## 2020-04-07 MED ORDER — INSULIN ASPART 100 UNIT/ML ~~LOC~~ SOLN
10.0000 [IU] | Freq: Three times a day (TID) | SUBCUTANEOUS | Status: DC
  Administered 2020-04-08: 10 [IU] via SUBCUTANEOUS

## 2020-04-07 MED ORDER — INSULIN ASPART 100 UNIT/ML ~~LOC~~ SOLN: 12.0000 [IU] | Freq: Three times a day (TID) | SUBCUTANEOUS | Status: DC

## 2020-04-07 NOTE — ED Notes (Signed)
ED TO INPATIENT HANDOFF REPORT  ED Nurse Name and Phone #:    S Name/Age/Gender Alexander Parks 69 y.o. male Room/Bed: APA01/APA01  Code Status   Code Status: Full Code  Home/SNF/Other Home Patient oriented to: self, place, time and situation Is this baseline? Yes   Triage Complete: Triage complete  Chief Complaint Acute respiratory failure with hypoxia ([place]) [J96.01] COVID-19 [U07.1]  Triage Note Pt reports tested positive for covid this morning.  Was sent to er for sob, cough, low o2 sat and weakness.  Reports chest pain.     Allergies Allergies  Allergen Reactions  . Benadryl [Diphenhydramine Hcl] Swelling  . Iodinated Diagnostic Agents Other (See Comments)    Unknown  . Morphine And Related Other (See Comments)    Unknown    Level of Care/Admitting Diagnosis ED Disposition    ED Disposition Condition [place]: [facility] [[id number]]  Level of Care: Stepdown [14]  Covid Evaluation: Confirmed COVID Positive  Diagnosis: COVID-19 [[id number]]  Admitting Physician: Rolla Plate [[id number]]  Attending Physician: Rolla Plate [[id number]]  Estimated length of stay: 3 - 4 days  Certification:: I certify this patient will need inpatient services for at least 2 midnights       B Medical/Surgery History Past Medical History:  Diagnosis Date  . Allergy   . Cirrhosis ([place])   . Coronary artery disease   . Diabetes mellitus without complication ([place])   . History of kidney stones    Past Surgical History:  Procedure Laterality Date  . BALLOON DILATION  08/21/2012   Procedure: BALLOON DILATION;  Surgeon: Marissa Nestle, MD;  Location: [facility];  Service: Urology;  Laterality: Right;  Balloon Dilation Right Ureter  . BIOPSY  01/17/2020   Procedure: BIOPSY;  Surgeon: Rogene Houston, MD;  Location: [facility];  Service: Endoscopy;;  antral esophageal  . CHOLECYSTECTOMY N/A 02/12/2020   Procedure: LAPAROSCOPIC  CHOLECYSTECTOMY;  Surgeon: Virl Cagey, MD;  Location: [facility];  Service: General;  Laterality: N/A;  . COLONOSCOPY WITH PROPOFOL N/A 01/17/2020   Procedure: COLONOSCOPY WITH PROPOFOL;  Surgeon: Rogene Houston, MD;  Location: [facility];  Service: Endoscopy;  Laterality: N/A;  730  . CORONARY ARTERY BYPASS GRAFT N/A 02/08/2018   Procedure: CORONARY ARTERY BYPASS GRAFT times five, LIMA-LAD, SVG-DIAG, SVG-OM, SVG-PD-PL(SEQ), using left internal mammary artery and Endoharvest of Right greater saphenous vein;  Surgeon: Grace Isaac, MD;  Location: [place];  Service: Open Heart Surgery;  Laterality: N/A;  . CYSTOSCOPY W/ URETERAL STENT PLACEMENT  08/21/2012   Procedure: CYSTOSCOPY WITH RETROGRADE PYELOGRAM/URETERAL STENT PLACEMENT;  Surgeon: Marissa Nestle, MD;  Location: [facility];  Service: Urology;  Laterality: Right;  Cystoscopy with Right Retrograde Pyelogram, Right Ureteral Stent Placement  . ESOPHAGOGASTRODUODENOSCOPY (EGD) WITH PROPOFOL N/A 01/17/2020   Procedure: ESOPHAGOGASTRODUODENOSCOPY (EGD) WITH PROPOFOL;  Surgeon: Rogene Houston, MD;  Location: [facility];  Service: Endoscopy;  Laterality: N/A;  . HERNIA REPAIR    . HOLMIUM LASER APPLICATION  08/18/4313   Procedure: HOLMIUM LASER APPLICATION;  Surgeon: Marissa Nestle, MD;  Location: [facility];  Service: Urology;  Laterality: Right;  Holmium Laser Application to Right Ureteral Calculus  . INTRAOPERATIVE TRANSESOPHAGEAL ECHOCARDIOGRAM N/A 02/08/2018   Procedure: INTRAOPERATIVE TRANSESOPHAGEAL ECHOCARDIOGRAM;  Surgeon: Grace Isaac, MD;  [facility];  Service: Open Heart Surgery;  Laterality: N/A;  . LIVER BIOPSY N/A 02/12/2020   Procedure: LIVER BIOPSY;  Surgeon: Constance Haw,  Lanell Matar, MD;  Location: [facility];  Service: General;  Laterality: N/A;  . POLYPECTOMY  01/17/2020   Procedure: POLYPECTOMY;  Surgeon: Rogene Houston, MD;  Location[facility];  Service: Endoscopy;;  colon  . RIGHT/LEFT HEART CATH AND CORONARY  ANGIOGRAPHY N/A 02/07/2018   Procedure: RIGHT/LEFT HEART CATH AND CORONARY ANGIOGRAPHY;  Surgeon: Burnell Blanks, MD;  Location: [place] [facility];  Service: Cardiovascular;  Laterality: N/A;  . STONE EXTRACTION WITH BASKET  08/21/2012   Procedure: STONE EXTRACTION WITH BASKET;  Surgeon: Marissa Nestle, MD;  Location: [facility];  Service: Urology;  Laterality: Right;  Right Ureteral Stone Pension scheme manager  . UMBILICAL HERNIA REPAIR N/A 02/12/2020   Procedure: HERNIA REPAIR UMBILICAL ADULT;  Surgeon: Virl Cagey, MD;  Location: [facility];  Service: General;  Laterality: N/A;     A IV Location/Drains/Wounds Patient Lines/Drains/Airways Status    Active Line/Drains/Airways    Name Placement date Placement time Site Days   Peripheral IV 04/01/2020 Right Hand 04/10/2020  1151  Hand  1   Incision (Closed) 02/08/18 Chest Other (Comment) 02/08/18  1147   789   Incision (Closed) 02/08/18 Leg Right 02/08/18  1242   789   Incision (Closed) 02/12/20 Abdomen 02/12/20  1025   55   Incision - 4 Ports Abdomen Umbilicus Medial;Superior Left;Lateral Left;Distal;Lateral 02/12/20  0907   55          Intake/Output Last 24 hours  Intake/Output Summary (Last 24 hours) at 04/07/2020 1056 Last data filed at 04/02/2020 1621 Gross per 24 hour  Intake 366.4 ml  Output --  Net 366.4 ml    Labs/Imaging Results for orders placed or performed during the hospital encounter of 03/19/2020 (from the past 48 hour(s))  Urinalysis, Routine w reflex microscopic     Status: Abnormal   Collection Time: 04/13/2020 11:25 AM  Result Value Ref Range   Color, Urine YELLOW YELLOW   APPearance HAZY (A) CLEAR   Specific Gravity, Urine 1.011 1.005 - 1.030   pH 5.0 5.0 - 8.0   Glucose, UA 50 (A) NEGATIVE mg/dL   Hgb urine dipstick SMALL (A) NEGATIVE   Bilirubin Urine NEGATIVE NEGATIVE   Ketones, ur NEGATIVE NEGATIVE mg/dL   Protein, ur 100 (A) NEGATIVE mg/dL   Nitrite NEGATIVE NEGATIVE   Leukocytes,Ua NEGATIVE NEGATIVE    RBC / HPF 0-5 0 - 5 RBC/hpf   WBC, UA 11-20 0 - 5 WBC/hpf   Bacteria, UA RARE (A) NONE SEEN   Squamous Epithelial / LPF 0-5 0 - 5   Mucus PRESENT     Comment: Performed at [facility], [address], [place], [place] [address]  Lactic acid, plasma     Status: Abnormal   Collection Time: 04/05/2020 11:59 AM  Result Value Ref Range   Lactic Acid, Venous 2.3 (HH) 0.5 - 1.9 mmol/L    Comment: CRITICAL RESULT CALLED TO, READ BACK BY AND VERIFIED WITH: CARDWELL L AT 1239PM ON 101751 BY THOMPSON S. Performed at [facility], [address] [place], [place] [address]   CBC WITH DIFFERENTIAL     Status: Abnormal   Collection Time: 03/19/2020 11:59 AM  Result Value Ref Range   WBC 3.3 (L) 4.0 - 10.5 K/uL   RBC 4.28 4.22 - 5.81 MIL/uL   Hemoglobin 12.1 (L) 13.0 - 17.0 g/dL   HCT 37.1 (L) 39 - 52 %   MCV 86.7 80.0 - 100.0 fL   MCH 28.3 26.0 - 34.0 pg  MCHC 32.6 30.0 - 36.0 g/dL   RDW 16.7 (H) 11.5 - 15.5 %   Platelets 76 (L) 150 - 400 K/uL    Comment: REPEATED TO VERIFY PLATELET COUNT CONFIRMED BY SMEAR SPECIMEN CHECKED FOR CLOTS    nRBC 0.0 0.0 - 0.2 %   Neutrophils Relative % 64 %   Neutro Abs 2.2 1.7 - 7.7 K/uL   Band Neutrophils 3 %   Lymphocytes Relative 24 %   Lymphs Abs 0.8 0.7 - 4.0 K/uL   Monocytes Relative 7 %   Monocytes Absolute 0.2 0 - 1 K/uL   Eosinophils Relative 1 %   Eosinophils Absolute 0.0 0 - 0 K/uL   Basophils Relative 0 %   Basophils Absolute 0.0 0 - 0 K/uL   Metamyelocytes Relative 1 %   Abs Immature Granulocytes 0.03 0.00 - 0.07 K/uL    Comment: Performed at [facility], [address], [place], [place] [address]  Comprehensive metabolic panel     Status: Abnormal   Collection Time: 03/20/2020 11:59 AM  Result Value Ref Range   Sodium 131 (L) 135 - 145 mmol/L   Potassium 3.8 3.5 - 5.1 mmol/L   Chloride 99 98 - 111 mmol/L   CO2 21 (L) 22 - 32 mmol/L   Glucose, Bld 248 (H) 70 - 99 mg/dL    Comment: Glucose reference range applies only to samples taken  after fasting for at least 8 hours.   BUN 46 (H) 8 - 23 mg/dL   Creatinine, Ser 1.73 (H) 0.61 - 1.24 mg/dL   Calcium 8.4 (L) 8.9 - 10.3 mg/dL   Total Protein 7.5 6.5 - 8.1 g/dL   Albumin 3.2 (L) 3.5 - 5.0 g/dL   AST 92 (H) 15 - 41 U/L   ALT 74 (H) 0 - 44 U/L   Alkaline Phosphatase 68 38 - 126 U/L   Total Bilirubin 1.5 (H) 0.3 - 1.2 mg/dL   GFR calc non Af Amer 39 (L) >60 mL/min   GFR calc Af Amer 46 (L) >60 mL/min   Anion gap 11 5 - 15    Comment: Performed at [facility], [address] [place], [place] [address]  D-dimer, quantitative     Status: Abnormal   Collection Time: 03/29/2020 11:59 AM  Result Value Ref Range   D-Dimer, Quant 1.36 (H) 0.00 - 0.50 ug/mL-FEU    Comment: (NOTE) At the manufacturer cut-off of 0.50 ug/mL FEU, this assay has been documented to exclude PE with a sensitivity and negative predictive value of 97 to 99%.  At this time, this assay has not been approved by the FDA to exclude DVT/VTE. Results should be correlated with clinical presentation. Performed at [facility], [address], [place], [place] [address]   Procalcitonin     Status: None   Collection Time: 03/25/2020 11:59 AM  Result Value Ref Range   Procalcitonin <0.10 ng/mL    Comment:        Interpretation: PCT (Procalcitonin) <= 0.5 ng/mL: Systemic infection (sepsis) is not likely. Local bacterial infection is possible. (NOTE)       Sepsis PCT Algorithm           Lower Respiratory Tract                                      Infection PCT Algorithm    ----------------------------     ----------------------------  PCT < 0.25 ng/mL                PCT < 0.10 ng/mL          Strongly encourage             Strongly discourage   discontinuation of antibiotics    initiation of antibiotics    ----------------------------     -----------------------------       PCT 0.25 - 0.50 ng/mL            PCT 0.10 - 0.25 ng/mL               OR       >80% decrease in PCT            Discourage initiation  of                                            antibiotics      Encourage discontinuation           of antibiotics    ----------------------------     -----------------------------         PCT >= 0.50 ng/mL              PCT 0.26 - 0.50 ng/mL               AND        <80% decrease in PCT             Encourage initiation of                                             antibiotics       Encourage continuation           of antibiotics    ----------------------------     -----------------------------        PCT >= 0.50 ng/mL                  PCT > 0.50 ng/mL               AND         increase in PCT                  Strongly encourage                                      initiation of antibiotics    Strongly encourage escalation           of antibiotics                                     -----------------------------                                           PCT <= 0.25 ng/mL  OR                                        > 80% decrease in PCT                                      Discontinue / Do not initiate                                             antibiotics  Performed at [facility], [address], [place], [place] [address]   Lactate dehydrogenase     Status: Abnormal   Collection Time: 03/19/2020 11:59 AM  Result Value Ref Range   LDH 344 (H) 98 - 192 U/L    Comment: Performed at [facility], [address] [place], [place] [address]  Ferritin     Status: Abnormal   Collection Time: 03/25/2020 11:59 AM  Result Value Ref Range   Ferritin 1,429 (H) 24 - 336 ng/mL    Comment: Performed at [facility], [address] [place], [place] [address]  Triglycerides     Status: None   Collection Time: 03/18/2020 11:59 AM  Result Value Ref Range   Triglycerides 88 <150 mg/dL    Comment: Performed at [facility], [address], [place], [place] [address]  Fibrinogen     Status: Abnormal   Collection Time: 04/13/2020 11:59 AM  Result Value Ref  Range   Fibrinogen 555 (H) 210 - 475 mg/dL    Comment: Performed at [facility], [address], [place], [place] [address]  C-reactive protein     Status: Abnormal   Collection Time: 03/23/2020 11:59 AM  Result Value Ref Range   CRP 9.8 (H) <1.0 mg/dL    Comment: Performed at [facility], [address], [place], [place] [address]  HIV Antibody (routine testing w rflx)     Status: None   Collection Time: 03/26/2020 11:59 AM  Result Value Ref Range   HIV Screen 4th Generation wRfx Non Reactive Non Reactive    Comment: Performed at [facility], [place] [address] [place], [place] [address]  ABO/Rh     Status: None   Collection Time: 04/11/2020 11:59 AM  Result Value Ref Range   ABO/RH(D)      B NEG Performed at [facility], [address], [place], [place] [address]   SARS Coronavirus 2 by RT PCR (hospital order, performed in [facility] hospital lab) Nasopharyngeal Nasopharyngeal Swab     Status: Abnormal   Collection Time: 04/10/2020  1:38 PM   Specimen: Nasopharyngeal Swab  Result Value Ref Range   SARS Coronavirus 2 POSITIVE (A) NEGATIVE    Comment: RESULT CALLED TO, READ BACK BY AND VERIFIED WITH: JENNIFER KENDRICK,RN @1641  03/16/2020 KAY (NOTE) SARS-CoV-2 target nucleic acids are DETECTED  SARS-CoV-2 RNA is generally detectable in upper respiratory specimens  during the acute phase of infection.  Positive results are indicative  of the presence of the identified virus, but do not rule out bacterial infection or co-infection with other pathogens not detected by the test.  Clinical correlation with patient history and  other diagnostic information is necessary to determine patient infection status.  The  expected result is negative.  Fact Sheet for Patients:   StrictlyIdeas.no   Fact Sheet for Healthcare Providers:   BankingDealers.co.za    This test is not yet approved or cleared by the [place] FDA and  has been  authorized for detection and/or diagnosis of SARS-CoV-2 by FDA under an Emergency Use Authorization (EUA).  This EUA will remain in effect (meaning  this test can be used) for the duration of  the COVID-19 declaration under Section 564(b)(1) of the Act, 21 U.S.C. section 360-bbb-3(b)(1), unless the authorization is terminated or revoked sooner.  Performed at [facility], [address], [place], [place] [address]   Lactic acid, plasma     Status: None   Collection Time: 04/07/2020  2:14 PM  Result Value Ref Range   Lactic Acid, Venous 1.7 0.5 - 1.9 mmol/L    Comment: Performed at [facility], [address] [place], [place] [address]  CBG monitoring, ED     Status: Abnormal   Collection Time: 03/28/2020  4:30 PM  Result Value Ref Range   Glucose-Capillary 258 (H) 70 - 99 mg/dL    Comment: Glucose reference range applies only to samples taken after fasting for at least 8 hours.  CBG monitoring, ED     Status: Abnormal   Collection Time: 04/10/2020  5:13 PM  Result Value Ref Range   Glucose-Capillary 248 (H) 70 - 99 mg/dL    Comment: Glucose reference range applies only to samples taken after fasting for at least 8 hours.  CBG monitoring, ED     Status: Abnormal   Collection Time: 04/04/2020  9:20 PM  Result Value Ref Range   Glucose-Capillary 314 (H) 70 - 99 mg/dL    Comment: Glucose reference range applies only to samples taken after fasting for at least 8 hours.  CBG monitoring, ED     Status: Abnormal   Collection Time: 04/07/20  3:40 AM  Result Value Ref Range   Glucose-Capillary 322 (H) 70 - 99 mg/dL    Comment: Glucose reference range applies only to samples taken after fasting for at least 8 hours.  CBC with Differential/Platelet     Status: Abnormal   Collection Time: 04/07/20  3:46 AM  Result Value Ref Range   WBC 3.5 (L) 4.0 - 10.5 K/uL   RBC 4.28 4.22 - 5.81 MIL/uL   Hemoglobin 11.8 (L) 13.0 - 17.0 g/dL   HCT 37.0 (L) 39 - 52 %   MCV 86.4 80.0 - 100.0 fL   MCH 27.6 26.0 -  34.0 pg   MCHC 31.9 30.0 - 36.0 g/dL   RDW 16.4 (H) 11.5 - 15.5 %   Platelets 76 (L) 150 - 400 K/uL    Comment: Immature Platelet Fraction may be clinically indicated, consider ordering this additional test [id number] CONSISTENT WITH PREVIOUS RESULT    nRBC 0.0 0.0 - 0.2 %   Neutrophils Relative % 68 %   Neutro Abs 2.4 1.7 - 7.7 K/uL   Lymphocytes Relative 24 %   Lymphs Abs 0.8 0.7 - 4.0 K/uL   Monocytes Relative 7 %   Monocytes Absolute 0.2 0 - 1 K/uL   Eosinophils Relative 0 %   Eosinophils Absolute 0.0 0 - 0 K/uL   Basophils Relative 0 %   Basophils Absolute 0.0 0 - 0 K/uL   Immature Granulocytes 1 %   Abs Immature Granulocytes 0.03 0.00 - 0.07 K/uL    Comment: Performed at [facility], [address] [place], [place]  [address]  Comprehensive metabolic panel     Status: Abnormal   Collection Time: 04/07/20  3:46 AM  Result Value Ref Range   Sodium 132 (L) 135 - 145 mmol/L   Potassium 4.1 3.5 - 5.1 mmol/L   Chloride 104 98 - 111 mmol/L   CO2 19 (L) 22 - 32 mmol/L   Glucose, Bld 356 (H) 70 - 99 mg/dL    Comment: Glucose reference range applies only to samples taken after fasting for at least 8 hours.   BUN 40 (H) 8 - 23 mg/dL   Creatinine, Ser 1.25 (H) 0.61 - 1.24 mg/dL   Calcium 8.3 (L) 8.9 - 10.3 mg/dL   Total Protein 6.8 6.5 - 8.1 g/dL   Albumin 2.8 (L) 3.5 - 5.0 g/dL   AST 111 (H) 15 - 41 U/L   ALT 96 (H) 0 - 44 U/L   Alkaline Phosphatase 67 38 - 126 U/L   Total Bilirubin 1.0 0.3 - 1.2 mg/dL   GFR calc non Af Amer 58 (L) >60 mL/min   GFR calc Af Amer >60 >60 mL/min   Anion gap 9 5 - 15    Comment: Performed at [facility], [address] [place], [place] [address]  C-reactive protein     Status: Abnormal   Collection Time: 04/07/20  3:46 AM  Result Value Ref Range   CRP 9.0 (H) <1.0 mg/dL    Comment: Performed at [facility], [address] [place], [place] [address]  D-dimer, quantitative (not at [facility])     Status: Abnormal   Collection Time: 04/07/20  3:46  AM  Result Value Ref Range   D-Dimer, Quant 1.03 (H) 0.00 - 0.50 ug/mL-FEU    Comment: (NOTE) At the manufacturer cut-off of 0.50 ug/mL FEU, this assay has been documented to exclude PE with a sensitivity and negative predictive value of 97 to 99%.  At this time, this assay has not been approved by the FDA to exclude DVT/VTE. Results should be correlated with clinical presentation. Performed at [facility], [address] [place], [place] [address]   Ferritin     Status: Abnormal   Collection Time: 04/07/20  3:46 AM  Result Value Ref Range   Ferritin 1,566 (H) 24 - 336 ng/mL    Comment: Performed at [facility], [address] [place], [place] [address]  Magnesium     Status: None   Collection Time: 04/07/20  3:46 AM  Result Value Ref Range   Magnesium 2.0 1.7 - 2.4 mg/dL    Comment: Performed at [facility], [address], [place], [place] [address]  Phosphorus     Status: None   Collection Time: 04/07/20  3:46 AM  Result Value Ref Range   Phosphorus 3.1 2.5 - 4.6 mg/dL    Comment: Performed at [facility], [address] [place], [place] [address]   DG Chest [place] 1 View  Result Date: 03/15/2020 CLINICAL DATA:  69 year old male positive COVID-19.  Cough. EXAM: PORTABLE CHEST 1 VIEW COMPARISON:  02/21/2020 chest radiographs and earlier. FINDINGS: AP view at 1137 hours. Increased lordotic positioning. Possibly lower lung volumes. Stable cardiac size and mediastinal contours. Prior CABG. Chronic increased interstitial markings in both lungs, with mild new patchy and vague opacity along the right minor fissure and possibly also at the left lung base. No superimposed pneumothorax or pleural effusion. Visualized tracheal air column is within normal limits. No acute osseous abnormality identified. Paucity of bowel gas in the upper abdomen. IMPRESSION:  Chronic pulmonary interstitial changes suspected with evidence of mild superimposed COVID-19 pneumonia. Electronically Signed   By: Genevie Ann  M.D.   On: 04/12/2020 11:51    Pending Labs Unresulted Labs (From admission, onward)          Start     Ordered   04/13/20 0500  Creatinine, serum  (enoxaparin (LOVENOX)    CrCl >/= 30 ml/min)  Weekly,   R     Comments: while on enoxaparin therapy    03/27/2020 1549   04/07/20 0500  CBC with Differential/Platelet  Daily,   R      04/14/2020 1549   04/07/20 0500  Comprehensive metabolic panel  Daily,   R      03/23/2020 1549   04/07/20 0500  C-reactive protein  Daily,   R      04/10/2020 1549   04/07/20 0500  D-dimer, quantitative (not at [facility])  Daily,   R      04/05/2020 1549   04/07/20 0500  Ferritin  Daily,   R      03/27/2020 1549   04/07/20 0500  Magnesium  Daily,   R      04/07/2020 1549   04/07/20 0500  Phosphorus  Daily,   R      03/24/2020 1549   03/27/2020 1357  Hemoglobin A1c  Add-on,   AD       Comments: To assess prior glycemic control    04/08/2020 1357   04/13/2020 1124  Blood Culture (routine x 2)  BLOOD CULTURE X 2,   STAT      04/13/2020 1124          Vitals/Pain Today's Vitals   04/07/20 0600 04/07/20 0630 04/07/20 0730 04/07/20 0800  BP: 118/70 115/63 127/68 120/66  Pulse: 73 72 75 77  Resp: (!) 22 (!) 24 (!) 23 20  Temp:      TempSrc:      SpO2: 91% 91% 92% (!) 89%  Weight:      Height:      PainSc:        Isolation Precautions Airborne and Contact precautions  Medications Medications  remdesivir 100 mg in sodium chloride 0.9 % 100 mL IVPB (100 mg Intravenous Not Given 03/20/2020 1659)    Followed by  remdesivir 100 mg in sodium chloride 0.9 % 100 mL IVPB (has no administration in time range)  aspirin EC tablet 81 mg (81 mg Oral Given 03/29/2020 1622)  atorvastatin (LIPITOR) tablet 80 mg (80 mg Oral Given 03/30/2020 1714)  carvedilol (COREG) tablet 3.125 mg (3.125 mg Oral Given 04/10/2020 1622)  furosemide (LASIX) tablet 40 mg (has no administration in time range)  docusate sodium (COLACE) capsule 100 mg (has no administration in time range)  pantoprazole (PROTONIX) EC  tablet 40 mg (has no administration in time range)  insulin aspart (novoLOG) injection 0-15 Units (5 Units Subcutaneous Given 04/07/2020 1850)  insulin aspart (novoLOG) injection 0-5 Units (4 Units Subcutaneous Given 04/07/2020 2132)  enoxaparin (LOVENOX) injection 40 mg (40 mg Subcutaneous Given 03/29/2020 1625)  0.9 %  sodium chloride infusion ( Intravenous Rate/Dose Verify 04/07/20 0120)  Ipratropium-Albuterol (COMBIVENT) respimat 1 puff (1 puff Inhalation Given 04/07/20 0150)  methylPREDNISolone sodium succinate (SOLU-MEDROL) 125 mg/2 mL injection 51.25 mg (51.25 mg Intravenous Given 04/07/20 0230)  guaiFENesin-dextromethorphan (ROBITUSSIN DM) 100-10 MG/5ML syrup 10 mL (has no administration in time range)  chlorpheniramine-HYDROcodone (TUSSIONEX) 10-8 MG/5ML suspension 5 mL (has no administration in time range)  ascorbic acid (VITAMIN C)  tablet 500 mg (500 mg Oral Given 03/17/2020 1714)  zinc sulfate capsule 220 mg (220 mg Oral Given 04/01/2020 1622)  acetaminophen (TYLENOL) tablet 650 mg (has no administration in time range)  traZODone (DESYREL) tablet 25 mg (has no administration in time range)  ondansetron (ZOFRAN) tablet 4 mg (has no administration in time range)    Or  ondansetron (ZOFRAN) injection 4 mg (has no administration in time range)  bisacodyl (DULCOLAX) EC tablet 5 mg (has no administration in time range)  insulin aspart (novoLOG) injection 8 Units (has no administration in time range)  insulin glargine (LANTUS) injection 24 Units (has no administration in time range)  baricitinib (OLUMIANT) tablet 4 mg (has no administration in time range)  albuterol (VENTOLIN HFA) 108 (90 Base) MCG/ACT inhaler 2 puff (2 puffs Inhalation Given 03/26/2020 1226)  AeroChamber Plus Flo-Vu Medium MISC 1 each (1 each Other Given 04/11/2020 1226)  dexamethasone (DECADRON) injection 10 mg (10 mg Intravenous Given 04/12/2020 1226)  sodium chloride 0.9 % bolus 250 mL (0 mLs Intravenous Stopped 03/30/2020 1621)     Mobility walks Low fall risk   Focused Assessments    R Recommendations: See Admitting Provider Note  Report given to:   Additional Notes:

## 2020-04-07 NOTE — ED Notes (Signed)
Report to [facility], [place]

## 2020-04-07 NOTE — ED Notes (Addendum)
Pt O2 sats 85-86% on 7L high flow; pt pulse ox and O2 tubing adjusted; increased O2 to 8L, no  O2 saturation change; respiratory notified

## 2020-04-07 NOTE — ED Notes (Signed)
Pt up to [facility]. O2 down to 84% on 6LPM. Called RT, stated to watch pt and let him rebound back up.

## 2020-04-07 NOTE — Progress Notes (Addendum)
PROGRESS NOTE   Alexander Parks  TFT:[id number] DOB: 08-14-1951 DOA: 03/25/2020 PCP: Manon Hilding, MD   Chief Complaint  Patient presents with  . covid   Brief Admission History:  69 y.o. 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  He says that he tested positive this morning at an outside facility and was sent to the emergency department for symptoms of shortness of breath cough and low oxygen saturation and weakness.  He also was having chest discomfort.  His significant other has also tested positive for COVID-19.  Assessment & Plan:   Principal Problem:   Acute respiratory failure with hypoxia (HCC) Active Problems:   Chronic systolic heart failure (HCC)   Coronary artery disease involving native coronary artery of native heart without angina pectoris   Ischemic cardiomyopathy   S/P CABG x 5   Mixed hyperlipidemia   Type 2 diabetes mellitus (HCC)   Hepatic cirrhosis (HCC)   Pneumonia due to COVID-19 virus   AKI (acute kidney injury) ([place])   COVID-19   1. Acute respiratory failure with hypoxia secondary to Covid pneumonia-patient was initially requiring 4 L nasal cannula oxygen but unfortunately his oxygen needs are increasing and he is now on high flow oxygen.  I have started baricitinib 4 mg daily x 14 days for emergency use authorization.   GFR>60 today.  He remains on IV remdesivir and Solu-Medrol IV.  Continue vitamins and monitor inflammatory markers.  2. Acute kidney injury-suspect prerenal as he takes Lasix daily.  Improving.  He is being gently hydrated given his history of CHF.   Follow BMP.  Renally dose medications as appropriate. 3. Hepatic cirrhosis-recently had liver biopsy.  He has follow-up with Dr. Laural Golden for GI.  4. Coronary  artery disease status post CABG-resume heart medications. 5. Cardiomyopathy with history of systolic congestive heart failure-his most recent echo showed improvement in LV function to 60%. 6. Type 2 diabetes mellitus-he has presented with hyperglycemia likely steroid-induced.  Continue supplemental sliding scale coverage and CBG monitoring and basal bolus insulin.  check CBC 5 x per day.  A1c still pending.  DVT prophylaxis: lovenox  Code Status: Full   Family Communication: wife updated 8/23, 8/24 Disposition Plan: TBD   Consults called:   Admission status: INP  Status is: Inpatient  Remains inpatient appropriate because:IV treatments appropriate due to intensity of illness or inability to take PO and Inpatient level of care appropriate due to severity of illness.  Pt continues to have increasing oxygen requirements at rest.  Treatment escalated to include baricitinib.   Dispo: The patient is from: Home              Anticipated d/c is to: Home              Anticipated d/c date is: > 3 days              Patient currently is not medically stable to d/c.  Consultants:     Procedures:     Antimicrobials:   Subjective: Pt eating breakfast, he is having shortness of breath, he feels very weak.   Objective: Vitals:   04/07/20 0730 04/07/20 0800 04/07/20 0930 04/07/20 1159  BP: 127/68 120/66 118/62 121/69  Pulse: 75 77 80 80  Resp: (!) 23 20  19 18  Temp:      TempSrc:      SpO2: 92% (!) 89%  90%  Weight:      Height:        Intake/Output Summary (Last 24 hours) at 04/07/2020 1222 Last data filed at 04/08/2020 1621 Gross per 24 hour  Intake 366.4 ml  Output --  Net 366.4 ml   Filed Weights   03/24/2020 1117  Weight: 102 kg    Examination:  General exam: chronically ill appearing, Appears calm and comfortable Respiratory system: Clear to auscultation. Respiratory effort normal. Cardiovascular system:Moderate increased work of breathing.  Gastrointestinal system:  Abdomen is nondistended, soft and nontender. No organomegaly or masses felt. Normal bowel sounds heard. Central nervous system: Alert and oriented. No focal neurological deficits. Extremities: Symmetric 5 x 5 power. Skin: No rashes, lesions or ulcers Psychiatry: Judgement and insight appear normal. Flat affect.   Data Reviewed: I have personally reviewed following labs and imaging studies  CBC: Recent Labs  Lab 04/07/2020 1159 04/07/20 0346  WBC 3.3* 3.5*  NEUTROABS 2.2 2.4  HGB 12.1* 11.8*  HCT 37.1* 37.0*  MCV 86.7 86.4  PLT 76* 76*    Basic Metabolic Panel: Recent Labs  Lab 04/08/2020 1159 04/07/20 0346  NA 131* 132*  K 3.8 4.1  CL 99 104  CO2 21* 19*  GLUCOSE 248* 356*  BUN 46* 40*  CREATININE 1.73* 1.25*  CALCIUM 8.4* 8.3*  MG  --  2.0  PHOS  --  3.1    GFR: Estimated Creatinine Clearance: 71.1 mL/min (A) (by C-G formula based on SCr of 1.25 mg/dL (H)).  Liver Function Tests: Recent Labs  Lab 03/25/2020 1159 04/07/20 0346  AST 92* 111*  ALT 74* 96*  ALKPHOS 68 67  BILITOT 1.5* 1.0  PROT 7.5 6.8  ALBUMIN 3.2* 2.8*    CBG: Recent Labs  Lab 04/01/2020 1630 03/16/2020 1713 03/20/2020 2120 04/07/20 0340 04/07/20 1153  GLUCAP 258* 248* 314* 322* 426*    Recent Results (from the past 240 hour(s))  SARS Coronavirus 2 by RT PCR (hospital order, performed in [facility] [place] hospital lab) Nasopharyngeal Nasopharyngeal Swab     Status: Abnormal   Collection Time: 03/31/2020  1:38 PM   Specimen: Nasopharyngeal Swab  Result Value Ref Range Status   SARS Coronavirus 2 POSITIVE (A) NEGATIVE Final    Comment: RESULT CALLED TO, READ BACK BY AND VERIFIED WITH: JENNIFER KENDRICK,RN @1641  04/03/2020 KAY (NOTE) SARS-CoV-2 target nucleic acids are DETECTED  SARS-CoV-2 RNA is generally detectable in upper respiratory specimens  during the acute phase of infection.  Positive results are indicative  of the presence of the identified virus, but do not rule out bacterial  infection or co-infection with other pathogens not detected by the test.  Clinical correlation with patient history and  other diagnostic information is necessary to determine patient infection status.  The expected result is negative.  Fact Sheet for Patients:   StrictlyIdeas.no   Fact Sheet for Healthcare Providers:   BankingDealers.co.za    This test is not yet approved or cleared by the [place] FDA and  has been authorized for detection and/or diagnosis of SARS-CoV-2 by FDA under an Emergency Use Authorization (EUA).  This EUA will remain in effect (meaning  this test can be used) for the duration of  the COVID-19 declaration under Section 564(b)(1) of the Act, 21 U.S.C. section 360-bbb-3(b)(1), unless the authorization is terminated or revoked sooner.  Performed at [facility], [place]  [address] [place], [place] [address]      Radiology Studies: [facility] Chest [place] 1 View  Result Date: 03/19/2020 CLINICAL DATA:  69 year old male positive COVID-19.  Cough. EXAM: PORTABLE CHEST 1 VIEW COMPARISON:  02/21/2020 chest radiographs and earlier. FINDINGS: AP view at 1137 hours. Increased lordotic positioning. Possibly lower lung volumes. Stable cardiac size and mediastinal contours. Prior CABG. Chronic increased interstitial markings in both lungs, with mild new patchy and vague opacity along the right minor fissure and possibly also at the left lung base. No superimposed pneumothorax or pleural effusion. Visualized tracheal air column is within normal limits. No acute osseous abnormality identified. Paucity of bowel gas in the upper abdomen. IMPRESSION: Chronic pulmonary interstitial changes suspected with evidence of mild superimposed COVID-19 pneumonia. Electronically Signed   By: Genevie Ann M.D.   On: 04/08/2020 11:51     Scheduled Meds: . vitamin C  500 mg Oral Daily  . aspirin EC  81 mg Oral Daily  . atorvastatin  80 mg Oral q1800  .  baricitinib  4 mg Oral Daily  . carvedilol  3.125 mg Oral BID WC  . enoxaparin (LOVENOX) injection  40 mg Subcutaneous Q24H  . furosemide  40 mg Oral Daily  . insulin aspart  0-15 Units Subcutaneous TID WC  . insulin aspart  0-5 Units Subcutaneous QHS  . insulin aspart  12 Units Subcutaneous TID WC  . [START ON 04/08/2020] insulin glargine  30 Units Subcutaneous Daily  . Ipratropium-Albuterol  1 puff Inhalation Q6H  . methylPREDNISolone (SOLU-MEDROL) injection  0.5 mg/kg Intravenous Q12H  . pantoprazole  40 mg Oral QAC breakfast  . zinc sulfate  220 mg Oral Daily   Continuous Infusions: . sodium chloride 50 mL/hr at 04/07/20 0120  . remdesivir 100 mg in NS 100 mL 100 mg (04/07/20 1109)     LOS: 1 day   Critical Care Procedure Note Authorized and Performed by: Murvin Natal MD  Total Critical Care time:  35 mins  Due to a high probability of clinically significant, life threatening deterioration, the patient required my highest level of preparedness to intervene emergently and I personally spent this critical care time directly and personally managing the patient.  This critical care time included obtaining a history; examining the patient, pulse oximetry; ordering and review of studies; arranging urgent treatment with development of a management plan; evaluation of patient's response of treatment; frequent reassessment; and discussions with other providers.  This critical care time was performed to assess and manage the high probability of imminent and life threatening deterioration that could result in multi-organ failure.  It was exclusive of separately billable procedures and treating other patients and teaching time.   Irwin Brakeman, MD How to contact the [facility] Attending or Consulting provider [place] or covering provider during after hours [place], for this patient?  1. Check the care team in [facility] and look for a) attending/consulting TRH provider listed and b) the [facility] team listed 2. Log  into [email or web address] and use [place]'s universal password to access. If you do not have the password, please contact the hospital operator. 3. Locate the [facility] provider you are looking for under [facility] and page to a number that you can be directly reached. 4. If you still have difficulty reaching the provider, please page the [facility] (Director on Call) for the Hospitalists listed on amion for assistance.  04/07/2020, 12:22 PM

## 2020-04-08 ENCOUNTER — Other Ambulatory Visit: Payer: Self-pay | Admitting: Cardiology

## 2020-04-08 LAB — COMPREHENSIVE METABOLIC PANEL
ALT: 87 U/L — ABNORMAL HIGH (ref 0–44)
AST: 81 U/L — ABNORMAL HIGH (ref 15–41)
Albumin: 2.7 g/dL — ABNORMAL LOW (ref 3.5–5.0)
Alkaline Phosphatase: 58 U/L (ref 38–126)
Anion gap: 9 (ref 5–15)
BUN: 41 mg/dL — ABNORMAL HIGH (ref 8–23)
CO2: 21 mmol/L — ABNORMAL LOW (ref 22–32)
Calcium: 8.4 mg/dL — ABNORMAL LOW (ref 8.9–10.3)
Chloride: 108 mmol/L (ref 98–111)
Creatinine, Ser: 1.11 mg/dL (ref 0.61–1.24)
GFR calc Af Amer: >60 mL/min (ref >60)
GFR calc non Af Amer: >60 mL/min (ref >60)
Glucose, Bld: 244 mg/dL — ABNORMAL HIGH (ref 70–99)
Potassium: 4 mmol/L (ref 3.5–5.1)
Sodium: 138 mmol/L (ref 135–145)
Total Bilirubin: 1.1 mg/dL (ref 0.3–1.2)
Total Protein: 6.6 g/dL (ref 6.5–8.1)

## 2020-04-08 LAB — GLUCOSE, CAPILLARY
Glucose-Capillary: 241 mg/dL — ABNORMAL HIGH (ref 70–99)
Glucose-Capillary: 242 mg/dL — ABNORMAL HIGH (ref 70–99)
Glucose-Capillary: 367 mg/dL — ABNORMAL HIGH (ref 70–99)
Glucose-Capillary: 273 mg/dL — ABNORMAL HIGH (ref 70–99)
Glucose-Capillary: 158 mg/dL — ABNORMAL HIGH (ref 70–99)

## 2020-04-08 LAB — CBC WITH DIFFERENTIAL/PLATELET
Abs Immature Granulocytes: 0.09 10*3/uL — ABNORMAL HIGH (ref 0.00–0.07)
Basophils Absolute: 0 10*3/uL (ref 0.0–0.1)
Basophils Relative: 0 %
Eosinophils Absolute: 0 10*3/uL (ref 0.0–0.5)
Eosinophils Relative: 0 %
HCT: 35.9 % — ABNORMAL LOW (ref 39.0–52.0)
Hemoglobin: 11.7 g/dL — ABNORMAL LOW (ref 13.0–17.0)
Immature Granulocytes: 1 %
Lymphocytes Relative: 16 %
Lymphs Abs: 1.3 10*3/uL (ref 0.7–4.0)
MCH: 28.3 pg (ref 26.0–34.0)
MCHC: 32.6 g/dL (ref 30.0–36.0)
MCV: 86.7 fL (ref 80.0–100.0)
Monocytes Absolute: 0.5 10*3/uL (ref 0.1–1.0)
Monocytes Relative: 6 %
Neutro Abs: 6.6 10*3/uL (ref 1.7–7.7)
Neutrophils Relative %: 77 %
Platelets: 94 10*3/uL — ABNORMAL LOW (ref 150–400)
RBC: 4.14 MIL/uL — ABNORMAL LOW (ref 4.22–5.81)
RDW: 16.3 % — ABNORMAL HIGH (ref 11.5–15.5)
WBC: 8.5 10*3/uL (ref 4.0–10.5)
nRBC: 0 % (ref 0.0–0.2)

## 2020-04-08 LAB — MAGNESIUM: Magnesium: 1.9 mg/dL (ref 1.7–2.4)

## 2020-04-08 LAB — FERRITIN: Ferritin: 1611 ng/mL — ABNORMAL HIGH (ref 24–336)

## 2020-04-08 LAB — C-REACTIVE PROTEIN: CRP: 3.5 mg/dL — ABNORMAL HIGH (ref <1.0)

## 2020-04-08 LAB — PHOSPHORUS: Phosphorus: 3.5 mg/dL (ref 2.5–4.6)

## 2020-04-08 LAB — MRSA PCR SCREENING: MRSA by PCR: NEGATIVE

## 2020-04-08 LAB — D-DIMER, QUANTITATIVE: D-Dimer, Quant: 0.93 ug/mL-FEU — ABNORMAL HIGH (ref 0.00–0.50)

## 2020-04-08 MED ORDER — INSULIN ASPART 100 UNIT/ML ~~LOC~~ SOLN
12.0000 [IU] | Freq: Three times a day (TID) | SUBCUTANEOUS | Status: DC
  Administered 2020-04-08 – 2020-04-14 (×17): 12 [IU] via SUBCUTANEOUS

## 2020-04-08 MED ORDER — INSULIN DETEMIR 100 UNIT/ML ~~LOC~~ SOLN
18.0000 [IU] | Freq: Two times a day (BID) | SUBCUTANEOUS | Status: DC
  Administered 2020-04-08 – 2020-04-12 (×8): 18 [IU] via SUBCUTANEOUS
  Filled 2020-04-08 (×12): qty 0.18

## 2020-04-08 NOTE — Progress Notes (Signed)
Inpatient Diabetes Program Recommendations  AACE/ADA: New Consensus Statement on Inpatient Glycemic Control (2015)  Target Ranges:  Prepandial:   less than 140 mg/dL      Peak postprandial:   less than 180 mg/dL (1-2 hours)      Critically ill patients:  140 - 180 mg/dL   Lab Results  Component Value Date   GLUCAP 242 (H) 04/08/2020   HGBA1C 7.9 (H) 04/01/2020    Review of Glycemic Control  Diabetes history: DM2 Outpatient Diabetes medications: metformin 1000 mg bid Current orders for Inpatient glycemic control: Levemir 15 units bid, Novolog 0-15 units tidwc and hs + 10 units tidwc  Blood sugars in 200-300's with steroids on board.  Inpatient Diabetes Program Recommendations:     Increase Levemir to 18 units bid Increase Novolog to 12 units tidwc   Continue to follow.  Thank you. Lorenda Peck, RD, LDN, CDE Inpatient Diabetes Coordinator [phone]

## 2020-04-08 NOTE — Progress Notes (Signed)
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 He says that he tested positive this morning at an outside facility and was sent to the emergency department for symptoms of shortness of breath cough and low oxygen saturation and weakness. He also was having chest discomfort. His significant other has also tested positive for COVID-19.  8/25: Patient appears to be doing well this morning and is currently on 10L high flow nasal cannula oxygen.  Assessment & Plan:   Principal Problem:   Acute respiratory failure with hypoxia (HCC) Active Problems:   Chronic systolic heart failure (HCC)   Coronary artery disease involving native coronary artery of native heart without angina pectoris   Ischemic cardiomyopathy   S/P CABG x 5   Mixed hyperlipidemia   Type 2 diabetes mellitus (HCC)   Hepatic cirrhosis (HCC)   Pneumonia due to COVID-19 virus   AKI (acute kidney injury) ([place])   COVID-19   1. Acute respiratory failure with hypoxia secondary to Covid pneumonia-patient was initially requiring 4 L nasal cannula oxygen but unfortunately his oxygen needs are increasing and he is now on high flow oxygen.  Baricitinib 4 mg daily x 14 days started on 8/24 for emergency use authorization.  GFR>60 today.  He remains on IV remdesivir and Solu-Medrol IV. Continue vitamins and monitor inflammatory markers which are downtrending 2. Acute kidney injury-suspect prerenal as he takes Lasix daily.   This is now resolved.Marland Kitchen  He is being gently hydrated given his history of CHF.   Follow BMP. Renally dose medications as appropriate.  Continue on current  home Lasix and recheck labs in a.m. 3. Hepatic cirrhosis-recently had liver biopsy. He has follow-up with Dr. Laural Golden for GI. 4. Coronary artery disease status post CABG-resume heart medications. 5. Cardiomyopathy with history of systolic congestive heart failure-his most recent echo showed improvement in LV function to 60%. 6. Type 2 diabetes mellitus-he has presented with hyperglycemia likely steroid-induced. Continue supplemental sliding scale coverage and CBG monitoring and basal bolus insulin. check CBC 5 x per day.  A1c  7.9%.  DVT prophylaxis:lovenox Code Status:Full Family Communication:wife updated 8/23, 8/24, 8/25 Disposition Plan:TBD  Consults called: Admission status:INP Status is: Inpatient  Remains inpatient appropriate because:IV treatments appropriate due to intensity of illness or inability to take PO and Inpatient level of care appropriate due to severity of illness.  Pt continues to have increasing oxygen requirements at rest.  Treatment escalated to include baricitinib.   Dispo: The patient is from: Home  Anticipated d/c is to: Home  Anticipated d/c date is: > 3 days  Patient currently is not medically stable to d/c.  Consultants:   None  Procedures:   See below  Antimicrobials:  Anti-infectives (From admission, onward)   Start     Dose/Rate Route Frequency Ordered Stop   04/07/20 1000  remdesivir 100 mg in sodium chloride 0.9 % 100 mL IVPB       "Followed by" Linked Group Details   100 mg 200 mL/hr over 30 Minutes Intravenous Daily 03/16/2020 1348 04/11/20 0959   03/31/2020 1400  remdesivir 100 mg in sodium chloride 0.9 % 100 mL IVPB       "  Followed by" Linked Group Details   100 mg 200 mL/hr over 30 Minutes Intravenous Every 1 hr x 2 03/16/2020 1348 04/02/2020 1559      Subjective: Patient seen and evaluated today with no new acute complaints or concerns. No acute concerns or events noted  overnight.  Objective: Vitals:   04/08/20 0600 04/08/20 0700 04/08/20 0800 04/08/20 0820  BP: 118/61 (!) 128/52 130/61   Pulse: 68 71 (!) 54   Resp: (!) 21 19 (!) 23   Temp:   97.6 F (36.4 C)   TempSrc:   Oral   SpO2: 96% 94% 93% 92%  Weight:      Height:        Intake/Output Summary (Last 24 hours) at 04/08/2020 1124 Last data filed at 04/07/2020 1800 Gross per 24 hour  Intake 100 ml  Output --  Net 100 ml   Filed Weights   04/07/2020 1117 04/07/20 1738  Weight: 102 kg 96.6 kg    Examination:  General exam: Appears calm and comfortable  Respiratory system: Clear to auscultation. Respiratory effort normal.  Currently on 10 L nasal cannula. Cardiovascular system: S1 & S2 heard, RRR. Gastrointestinal system: Abdomen is nondistended, soft and nontender.  Central nervous system: Alert and oriented. No focal neurological deficits. Extremities: Symmetric 5 x 5 power. Skin: No rashes, lesions or ulcers Psychiatry: Judgement and insight appear normal. Mood & affect appropriate.     Data Reviewed: I have personally reviewed following labs and imaging studies  CBC: Recent Labs  Lab 04/05/2020 1159 04/07/20 0346 04/08/20 0501  WBC 3.3* 3.5* 8.5  NEUTROABS 2.2 2.4 6.6  HGB 12.1* 11.8* 11.7*  HCT 37.1* 37.0* 35.9*  MCV 86.7 86.4 86.7  PLT 76* 76* 94*   Basic Metabolic Panel: Recent Labs  Lab 03/31/2020 1159 04/07/20 0346 04/08/20 0501  NA 131* 132* 138  K 3.8 4.1 4.0  CL 99 104 108  CO2 21* 19* 21*  GLUCOSE 248* 356* 244*  BUN 46* 40* 41*  CREATININE 1.73* 1.25* 1.11  CALCIUM 8.4* 8.3* 8.4*  MG  --  2.0 1.9  PHOS  --  3.1 3.5   GFR: Estimated Creatinine Clearance: 73 mL/min (by C-G formula based on SCr of 1.11 mg/dL). Liver Function Tests: Recent Labs  Lab 04/08/2020 1159 04/07/20 0346 04/08/20 0501  AST 92* 111* 81*  ALT 74* 96* 87*  ALKPHOS 68 67 58  BILITOT 1.5* 1.0 1.1  PROT 7.5 6.8 6.6  ALBUMIN 3.2* 2.8* 2.7*   No results for input(s): LIPASE,  AMYLASE in the last 168 hours. No results for input(s): AMMONIA in the last 168 hours. Coagulation Profile: No results for input(s): INR, PROTIME in the last 168 hours. Cardiac Enzymes: No results for input(s): CKTOTAL, CKMB, CKMBINDEX, TROPONINI in the last 168 hours. BNP (last 3 results) No results for input(s): PROBNP in the last 8760 hours. HbA1C: Recent Labs    04/14/2020 1159  HGBA1C 7.9*   CBG: Recent Labs  Lab 04/07/20 1456 04/07/20 1736 04/07/20 2239 04/08/20 0358 04/08/20 0825  GLUCAP 368* 274* 243* 241* 242*   Lipid Profile: Recent Labs    03/26/2020 1159  TRIG 88   Thyroid Function Tests: No results for input(s): TSH, T4TOTAL, FREET4, T3FREE, THYROIDAB in the last 72 hours. Anemia Panel: Recent Labs    04/07/20 0346 04/08/20 0501  FERRITIN 1,566* 1,611*   Sepsis Labs: Recent Labs  Lab 03/27/2020 1159 04/09/2020 1414  PROCALCITON <0.10  --   LATICACIDVEN 2.3* 1.7  Recent Results (from the past 240 hour(s))  Blood Culture (routine x 2)     Status: None (Preliminary result)   Collection Time: 04/11/2020 11:50 AM   Specimen: BLOOD  Result Value Ref Range Status   Specimen Description BLOOD LEFT ANTECUBITAL  Final   Special Requests   Final    BOTTLES DRAWN AEROBIC AND ANAEROBIC Blood Culture results may not be optimal due to an inadequate volume of blood received in culture bottles   Culture   Final    NO GROWTH 2 DAYS Performed at [facility], [address] [place], [place] [address]    Report Status PENDING  Incomplete  Blood Culture (routine x 2)     Status: None (Preliminary result)   Collection Time: 04/08/2020 11:59 AM   Specimen: BLOOD LEFT HAND  Result Value Ref Range Status   Specimen Description BLOOD LEFT HAND  Final   Special Requests   Final    BOTTLES DRAWN AEROBIC AND ANAEROBIC Blood Culture results may not be optimal due to an inadequate volume of blood received in culture bottles   Culture   Final    NO GROWTH 2 DAYS Performed at  [facility], [address], [place], [place] [address]    Report Status PENDING  Incomplete  SARS Coronavirus 2 by RT PCR (hospital order, performed in [place] hospital lab) Nasopharyngeal Nasopharyngeal Swab     Status: Abnormal   Collection Time: 03/15/2020  1:38 PM   Specimen: Nasopharyngeal Swab  Result Value Ref Range Status   SARS Coronavirus 2 POSITIVE (A) NEGATIVE Final    Comment: RESULT CALLED TO, READ BACK BY AND VERIFIED WITH: JENNIFER KENDRICK,RN @1641  04/08/2020 KAY (NOTE) SARS-CoV-2 target nucleic acids are DETECTED  SARS-CoV-2 RNA is generally detectable in upper respiratory specimens  during the acute phase of infection.  Positive results are indicative  of the presence of the identified virus, but do not rule out bacterial infection or co-infection with other pathogens not detected by the test.  Clinical correlation with patient history and  other diagnostic information is necessary to determine patient infection status.  The expected result is negative.  Fact Sheet for Patients:   StrictlyIdeas.no   Fact Sheet for Healthcare Providers:   BankingDealers.co.za    This test is not yet approved or cleared by the [place] FDA and  has been authorized for detection and/or diagnosis of SARS-CoV-2 by FDA under an Emergency Use Authorization (EUA).  This EUA will remain in effect (meaning  this test can be used) for the duration of  the COVID-19 declaration under Section 564(b)(1) of the Act, 21 U.S.C. section 360-bbb-3(b)(1), unless the authorization is terminated or revoked sooner.  Performed at [facility], [address] [place], [place] [address]   MRSA PCR Screening     Status: None   Collection Time: 04/07/20  6:35 PM   Specimen: Nasal Mucosa; Nasopharyngeal  Result Value Ref Range Status   MRSA by PCR NEGATIVE NEGATIVE Final    Comment:        The GeneXpert MRSA Assay (FDA approved for NASAL  specimens only), is one component of a comprehensive MRSA colonization surveillance program. It is not intended to diagnose MRSA infection nor to guide or monitor treatment for MRSA infections. Performed at [facility], [address] [place], [place] [address]          Radiology Studies: [facility] Chest [place] 1 View  Result Date: 04/08/2020 CLINICAL DATA:  69 year old male positive COVID-19.  Cough. EXAM: PORTABLE CHEST 1 VIEW COMPARISON:  02/21/2020 chest radiographs and earlier. FINDINGS: AP view at 1137 hours. Increased lordotic positioning. Possibly lower lung volumes. Stable cardiac size and mediastinal contours. Prior CABG. Chronic increased interstitial markings in both lungs, with mild new patchy and vague opacity along the right minor fissure and possibly also at the left lung base. No superimposed pneumothorax or pleural effusion. Visualized tracheal air column is within normal limits. No acute osseous abnormality identified. Paucity of bowel gas in the upper abdomen. IMPRESSION: Chronic pulmonary interstitial changes suspected with evidence of mild superimposed COVID-19 pneumonia. Electronically Signed   By: Genevie Ann M.D.   On: 03/22/2020 11:51        Scheduled Meds: . vitamin C  500 mg Oral Daily  . aspirin EC  81 mg Oral Daily  . atorvastatin  80 mg Oral q1800  . baricitinib  4 mg Oral Daily  . carvedilol  3.125 mg Oral BID WC  . Chlorhexidine Gluconate Cloth  6 each Topical Daily  . enoxaparin (LOVENOX) injection  40 mg Subcutaneous Q24H  . furosemide  40 mg Oral Daily  . insulin aspart  0-15 Units Subcutaneous TID WC  . insulin aspart  0-5 Units Subcutaneous QHS  . insulin aspart  10 Units Subcutaneous TID WC  . insulin detemir  15 Units Subcutaneous BID  . Ipratropium-Albuterol  1 puff Inhalation Q6H  . methylPREDNISolone (SOLU-MEDROL) injection  0.5 mg/kg Intravenous Q12H  . pantoprazole  40 mg Oral QAC breakfast  . zinc sulfate  220 mg Oral Daily   Continuous  Infusions: . remdesivir 100 mg in NS 100 mL 100 mg (04/08/20 1004)     LOS: 2 days    Time spent: 35 minutes    Shawnita Krizek Darleen Crocker, DO [facility]  If 7PM-7AM, please contact night-coverage [email or web address] 04/08/2020, 11:24 AM

## 2020-04-09 LAB — CBC WITH DIFFERENTIAL/PLATELET
Abs Immature Granulocytes: 0.22 10*3/uL — ABNORMAL HIGH (ref 0.00–0.07)
Basophils Absolute: 0 10*3/uL (ref 0.0–0.1)
Basophils Relative: 0 %
Eosinophils Absolute: 0 10*3/uL (ref 0.0–0.5)
Eosinophils Relative: 0 %
HCT: 36.3 % — ABNORMAL LOW (ref 39.0–52.0)
Hemoglobin: 11.9 g/dL — ABNORMAL LOW (ref 13.0–17.0)
Immature Granulocytes: 2 %
Lymphocytes Relative: 13 %
Lymphs Abs: 1.4 10*3/uL (ref 0.7–4.0)
MCH: 28.5 pg (ref 26.0–34.0)
MCHC: 32.8 g/dL (ref 30.0–36.0)
MCV: 87.1 fL (ref 80.0–100.0)
Monocytes Absolute: 0.9 10*3/uL (ref 0.1–1.0)
Monocytes Relative: 9 %
Neutro Abs: 8.2 10*3/uL — ABNORMAL HIGH (ref 1.7–7.7)
Neutrophils Relative %: 76 %
Platelets: 106 10*3/uL — ABNORMAL LOW (ref 150–400)
RBC: 4.17 MIL/uL — ABNORMAL LOW (ref 4.22–5.81)
RDW: 16.3 % — ABNORMAL HIGH (ref 11.5–15.5)
WBC: 10.8 10*3/uL — ABNORMAL HIGH (ref 4.0–10.5)
nRBC: 0 % (ref 0.0–0.2)

## 2020-04-09 LAB — GLUCOSE, CAPILLARY
Glucose-Capillary: 134 mg/dL — ABNORMAL HIGH (ref 70–99)
Glucose-Capillary: 161 mg/dL — ABNORMAL HIGH (ref 70–99)
Glucose-Capillary: 247 mg/dL — ABNORMAL HIGH (ref 70–99)
Glucose-Capillary: 139 mg/dL — ABNORMAL HIGH (ref 70–99)
Glucose-Capillary: 137 mg/dL — ABNORMAL HIGH (ref 70–99)

## 2020-04-09 LAB — COMPREHENSIVE METABOLIC PANEL
ALT: 91 U/L — ABNORMAL HIGH (ref 0–44)
AST: 78 U/L — ABNORMAL HIGH (ref 15–41)
Albumin: 2.8 g/dL — ABNORMAL LOW (ref 3.5–5.0)
Alkaline Phosphatase: 63 U/L (ref 38–126)
Anion gap: 10 (ref 5–15)
BUN: 44 mg/dL — ABNORMAL HIGH (ref 8–23)
CO2: 21 mmol/L — ABNORMAL LOW (ref 22–32)
Calcium: 8.3 mg/dL — ABNORMAL LOW (ref 8.9–10.3)
Chloride: 106 mmol/L (ref 98–111)
Creatinine, Ser: 1.21 mg/dL (ref 0.61–1.24)
GFR calc Af Amer: >60 mL/min (ref >60)
GFR calc non Af Amer: >60 mL/min (ref >60)
Glucose, Bld: 156 mg/dL — ABNORMAL HIGH (ref 70–99)
Potassium: 3.9 mmol/L (ref 3.5–5.1)
Sodium: 137 mmol/L (ref 135–145)
Total Bilirubin: 1 mg/dL (ref 0.3–1.2)
Total Protein: 6.6 g/dL (ref 6.5–8.1)

## 2020-04-09 LAB — MAGNESIUM: Magnesium: 1.9 mg/dL (ref 1.7–2.4)

## 2020-04-09 LAB — FERRITIN: Ferritin: 1490 ng/mL — ABNORMAL HIGH (ref 24–336)

## 2020-04-09 LAB — PHOSPHORUS: Phosphorus: 2.6 mg/dL (ref 2.5–4.6)

## 2020-04-09 LAB — C-REACTIVE PROTEIN: CRP: 1.5 mg/dL — ABNORMAL HIGH (ref <1.0)

## 2020-04-09 LAB — D-DIMER, QUANTITATIVE: D-Dimer, Quant: 0.91 ug/mL-FEU — ABNORMAL HIGH (ref 0.00–0.50)

## 2020-04-09 NOTE — Progress Notes (Signed)
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 He says that he tested positive this morning at an outside facility and was sent to the emergency department for symptoms of shortness of breath cough and low oxygen saturation and weakness. He also was having chest discomfort. His significant other has also tested positive for COVID-19.  8/25: Patient appears to be doing well this morning and is currently on 10L high flow nasal cannula oxygen.  8/26: Patient continues to remain on 10 L high flow nasal cannula oxygen and cannot be weaned.  He denies any complaints.  Assessment & Plan:   Principal Problem:   Acute respiratory failure with hypoxia (HCC) Active Problems:   Chronic systolic heart failure (HCC)   Coronary artery disease involving native coronary artery of native heart without angina pectoris   Ischemic cardiomyopathy   S/P CABG x 5   Mixed hyperlipidemia   Type 2 diabetes mellitus (HCC)   Hepatic cirrhosis (HCC)   Pneumonia due to COVID-19 virus   AKI (acute kidney injury) ([place])   COVID-19   1. Acute respiratory failure with hypoxia secondary to Covid pneumonia-patientwas initiallyrequiring 4 L nasal cannula oxygenbut unfortunately his oxygen needs are increasing and he is now on high flow oxygen. Baricitinib 4 mg daily x 14 days started on 8/24 for emergency use authorization. GFR>60 today. He remains on IV remdesivirandSolu-Medrol IV. Continuevitamins and monitor inflammatory markers which are downtrending 2. Acute kidney injury-suspect prerenal as he takes Lasix daily.  This is now resolved.Marland Kitchen He is  being gently hydrated given his history of CHF. Follow BMP. Renally dose medications as appropriate.    Hold home Lasix for now and recheck labs in a.m.  Appears to be eating and drinking well enough. 3. Hepatic cirrhosis-recently had liver biopsy. He has follow-up with Dr. Laural Golden for GI. 4. Coronary artery disease status post CABG-resume heart medications. 5. Cardiomyopathy with history of systolic congestive heart failure-his most recent echo showed improvement in LV function to 60%. 6. Type 2 diabetes mellitus-he has presented with hyperglycemia likely steroid-induced. Continue supplemental sliding scale coverage and CBG monitoringand basal bolus insulin.check CBC 5 x per day.A1c 7.9%.  DVT prophylaxis:lovenox Code Status:Full Family Communication:wife updated 8/23, 8/24, 8/25, 8/26 Disposition Plan:TBD Consults called: Admission status:INP Status is: Inpatient  Remains inpatient appropriate because:IV treatments appropriate due to intensity of illness or inability to take PO and Inpatient level of care appropriate due to severity of illness. Pt continues to have increasing oxygen requirements at rest. Treatment escalated to include baricitinib.   Dispo: The patient is from:Home Anticipated d/c is HY:QMVH Anticipated d/c date is: > 3 days Patient currently is not medically stable to d/c.  Consultants:   None  Procedures:   See below  Antimicrobials:  Anti-infectives (From admission, onward)   Start     Dose/Rate Route Frequency Ordered Stop   04/07/20 1000  remdesivir 100 mg in sodium chloride 0.9 % 100 mL IVPB       "Followed by" Linked Group Details   100 mg 200 mL/hr over 30 Minutes Intravenous Daily 04/10/2020 1348 04/11/20 0959   03/25/2020 1400  remdesivir 100 mg in sodium chloride 0.9 %  100 mL IVPB       "Followed by" Linked Group Details   100 mg 200 mL/hr over 30 Minutes Intravenous Every 1 hr x 2  03/15/2020 1348 04/01/2020 1559      Subjective: Patient seen and evaluated today with no new acute complaints or concerns. No acute concerns or events noted overnight.  Objective: Vitals:   04/09/20 0804 04/09/20 0900 04/09/20 1000 04/09/20 1100  BP:  (!) 127/58 137/66 133/71  Pulse:  67 66 73  Resp:  (!) 23 20 (!) 21  Temp:  97.9 F (36.6 C)    TempSrc:  Oral    SpO2: 91% (!) 76% 91% (!) 89%  Weight:      Height:        Intake/Output Summary (Last 24 hours) at 04/09/2020 1247 Last data filed at 04/09/2020 0426 Gross per 24 hour  Intake 100 ml  Output 950 ml  Net -850 ml   Filed Weights   04/02/2020 1117 04/07/20 1738 04/09/20 0400  Weight: 102 kg 96.6 kg 96.6 kg    Examination:  General exam: Appears calm and comfortable  Respiratory system: Clear to auscultation. Respiratory effort normal.  Continues on 10 L nasal cannula oxygen. Cardiovascular system: S1 & S2 heard, RRR. Gastrointestinal system: Abdomen is nondistended, soft and nontender.  Central nervous system: Alert and oriented. No focal neurological deficits. Extremities: Symmetric 5 x 5 power. Skin: No rashes, lesions or ulcers Psychiatry: Judgement and insight appear normal. Mood & affect appropriate.     Data Reviewed: I have personally reviewed following labs and imaging studies  CBC: Recent Labs  Lab 04/10/2020 1159 04/07/20 0346 04/08/20 0501 04/09/20 0438  WBC 3.3* 3.5* 8.5 10.8*  NEUTROABS 2.2 2.4 6.6 8.2*  HGB 12.1* 11.8* 11.7* 11.9*  HCT 37.1* 37.0* 35.9* 36.3*  MCV 86.7 86.4 86.7 87.1  PLT 76* 76* 94* 161*   Basic Metabolic Panel: Recent Labs  Lab 04/10/2020 1159 04/07/20 0346 04/08/20 0501 04/09/20 0438  NA 131* 132* 138 137  K 3.8 4.1 4.0 3.9  CL 99 104 108 106  CO2 21* 19* 21* 21*  GLUCOSE 248* 356* 244* 156*  BUN 46* 40* 41* 44*  CREATININE 1.73* 1.25* 1.11 1.21  CALCIUM 8.4* 8.3* 8.4* 8.3*  MG  --  2.0 1.9 1.9  PHOS  --  3.1 3.5 2.6   GFR: Estimated Creatinine Clearance:  67 mL/min (by C-G formula based on SCr of 1.21 mg/dL). Liver Function Tests: Recent Labs  Lab 03/15/2020 1159 04/07/20 0346 04/08/20 0501 04/09/20 0438  AST 92* 111* 81* 78*  ALT 74* 96* 87* 91*  ALKPHOS 68 67 58 63  BILITOT 1.5* 1.0 1.1 1.0  PROT 7.5 6.8 6.6 6.6  ALBUMIN 3.2* 2.8* 2.7* 2.8*   No results for input(s): LIPASE, AMYLASE in the last 168 hours. No results for input(s): AMMONIA in the last 168 hours. Coagulation Profile: No results for input(s): INR, PROTIME in the last 168 hours. Cardiac Enzymes: No results for input(s): CKTOTAL, CKMB, CKMBINDEX, TROPONINI in the last 168 hours. BNP (last 3 results) No results for input(s): PROBNP in the last 8760 hours. HbA1C: No results for input(s): HGBA1C in the last 72 hours. CBG: Recent Labs  Lab 04/08/20 1703 04/08/20 2217 04/09/20 0239 04/09/20 0846 04/09/20 1159  GLUCAP 273* 158* 134* 161* 247*   Lipid Profile: No results for input(s): CHOL, HDL, LDLCALC, TRIG, CHOLHDL, LDLDIRECT in the last 72 hours. Thyroid Function Tests: No results for input(s): TSH, T4TOTAL, FREET4, T3FREE,  THYROIDAB in the last 72 hours. Anemia Panel: Recent Labs    04/08/20 0501 04/09/20 0438  FERRITIN 1,611* 1,490*   Sepsis Labs: Recent Labs  Lab 04/14/2020 1159 03/22/2020 1414  PROCALCITON <0.10  --   LATICACIDVEN 2.3* 1.7    Recent Results (from the past 240 hour(s))  Blood Culture (routine x 2)     Status: None (Preliminary result)   Collection Time: 03/31/2020 11:50 AM   Specimen: BLOOD  Result Value Ref Range Status   Specimen Description BLOOD LEFT ANTECUBITAL  Final   Special Requests   Final    BOTTLES DRAWN AEROBIC AND ANAEROBIC Blood Culture results may not be optimal due to an inadequate volume of blood received in culture bottles   Culture   Final    NO GROWTH 3 DAYS Performed at [facility], [address], [place], [place] [address]    Report Status PENDING  Incomplete  Blood Culture (routine x 2)     Status: None  (Preliminary result)   Collection Time: 04/04/2020 11:59 AM   Specimen: BLOOD LEFT HAND  Result Value Ref Range Status   Specimen Description BLOOD LEFT HAND  Final   Special Requests   Final    BOTTLES DRAWN AEROBIC AND ANAEROBIC Blood Culture results may not be optimal due to an inadequate volume of blood received in culture bottles   Culture   Final    NO GROWTH 3 DAYS Performed at [facility], [address], [place], [place] [address]    Report Status PENDING  Incomplete  SARS Coronavirus 2 by RT PCR (hospital order, performed in [place] hospital lab) Nasopharyngeal Nasopharyngeal Swab     Status: Abnormal   Collection Time: 04/08/2020  1:38 PM   Specimen: Nasopharyngeal Swab  Result Value Ref Range Status   SARS Coronavirus 2 POSITIVE (A) NEGATIVE Final    Comment: RESULT CALLED TO, READ BACK BY AND VERIFIED WITH: JENNIFER KENDRICK,RN @1641  03/16/2020 KAY (NOTE) SARS-CoV-2 target nucleic acids are DETECTED  SARS-CoV-2 RNA is generally detectable in upper respiratory specimens  during the acute phase of infection.  Positive results are indicative  of the presence of the identified virus, but do not rule out bacterial infection or co-infection with other pathogens not detected by the test.  Clinical correlation with patient history and  other diagnostic information is necessary to determine patient infection status.  The expected result is negative.  Fact Sheet for Patients:   StrictlyIdeas.no   Fact Sheet for Healthcare Providers:   BankingDealers.co.za    This test is not yet approved or cleared by the [place] FDA and  has been authorized for detection and/or diagnosis of SARS-CoV-2 by FDA under an Emergency Use Authorization (EUA).  This EUA will remain in effect (meaning  this test can be used) for the duration of  the COVID-19 declaration under Section 564(b)(1) of the Act, 21 U.S.C. section 360-bbb-3(b)(1), unless  the authorization is terminated or revoked sooner.  Performed at [facility], [address] [place], [place] [address]   MRSA PCR Screening     Status: None   Collection Time: 04/07/20  6:35 PM   Specimen: Nasal Mucosa; Nasopharyngeal  Result Value Ref Range Status   MRSA by PCR NEGATIVE NEGATIVE Final    Comment:        The GeneXpert MRSA Assay (FDA approved for NASAL specimens only), is one component of a comprehensive MRSA colonization surveillance program. It is not intended to diagnose MRSA infection nor to  guide or monitor treatment for MRSA infections. Performed at [facility], [address], [place], [place] [address]          Radiology Studies: No results found.      Scheduled Meds: . vitamin C  500 mg Oral Daily  . aspirin EC  81 mg Oral Daily  . atorvastatin  80 mg Oral q1800  . baricitinib  4 mg Oral Daily  . carvedilol  3.125 mg Oral BID WC  . Chlorhexidine Gluconate Cloth  6 each Topical Daily  . enoxaparin (LOVENOX) injection  40 mg Subcutaneous Q24H  . insulin aspart  0-15 Units Subcutaneous TID WC  . insulin aspart  0-5 Units Subcutaneous QHS  . insulin aspart  12 Units Subcutaneous TID WC  . insulin detemir  18 Units Subcutaneous BID  . Ipratropium-Albuterol  1 puff Inhalation Q6H  . methylPREDNISolone (SOLU-MEDROL) injection  0.5 mg/kg Intravenous Q12H  . pantoprazole  40 mg Oral QAC breakfast  . zinc sulfate  220 mg Oral Daily   Continuous Infusions: . remdesivir 100 mg in NS 100 mL 100 mg (04/09/20 1059)     LOS: 3 days    Time spent: 35 minutes    Raymie Giammarco Darleen Crocker, DO [facility]  If 7PM-7AM, please contact night-coverage [email or web address] 04/09/2020, 12:47 PM

## 2020-04-10 ENCOUNTER — Inpatient Hospital Stay (HOSPITAL_COMMUNITY): Payer: Medicare HMO

## 2020-04-10 LAB — GLUCOSE, CAPILLARY
Glucose-Capillary: 133 mg/dL — ABNORMAL HIGH (ref 70–99)
Glucose-Capillary: 163 mg/dL — ABNORMAL HIGH (ref 70–99)
Glucose-Capillary: 158 mg/dL — ABNORMAL HIGH (ref 70–99)
Glucose-Capillary: 110 mg/dL — ABNORMAL HIGH (ref 70–99)
Glucose-Capillary: 83 mg/dL (ref 70–99)

## 2020-04-10 LAB — CBC WITH DIFFERENTIAL/PLATELET
Abs Immature Granulocytes: 0.16 10*3/uL — ABNORMAL HIGH (ref 0.00–0.07)
Basophils Absolute: 0 10*3/uL (ref 0.0–0.1)
Basophils Relative: 0 %
Eosinophils Absolute: 0 10*3/uL (ref 0.0–0.5)
Eosinophils Relative: 0 %
HCT: 37.9 % — ABNORMAL LOW (ref 39.0–52.0)
Hemoglobin: 12.5 g/dL — ABNORMAL LOW (ref 13.0–17.0)
Immature Granulocytes: 2 %
Lymphocytes Relative: 12 %
Lymphs Abs: 1 10*3/uL (ref 0.7–4.0)
MCH: 28 pg (ref 26.0–34.0)
MCHC: 33 g/dL (ref 30.0–36.0)
MCV: 84.8 fL (ref 80.0–100.0)
Monocytes Absolute: 0.6 10*3/uL (ref 0.1–1.0)
Monocytes Relative: 7 %
Neutro Abs: 6.7 10*3/uL (ref 1.7–7.7)
Neutrophils Relative %: 79 %
Platelets: 100 10*3/uL — ABNORMAL LOW (ref 150–400)
RBC: 4.47 MIL/uL (ref 4.22–5.81)
RDW: 16.3 % — ABNORMAL HIGH (ref 11.5–15.5)
WBC: 8.4 10*3/uL (ref 4.0–10.5)
nRBC: 0 % (ref 0.0–0.2)

## 2020-04-10 LAB — D-DIMER, QUANTITATIVE: D-Dimer, Quant: 1.13 ug/mL-FEU — ABNORMAL HIGH (ref 0.00–0.50)

## 2020-04-10 LAB — COMPREHENSIVE METABOLIC PANEL
ALT: 81 U/L — ABNORMAL HIGH (ref 0–44)
AST: 64 U/L — ABNORMAL HIGH (ref 15–41)
Albumin: 2.8 g/dL — ABNORMAL LOW (ref 3.5–5.0)
Alkaline Phosphatase: 65 U/L (ref 38–126)
Anion gap: 10 (ref 5–15)
BUN: 34 mg/dL — ABNORMAL HIGH (ref 8–23)
CO2: 20 mmol/L — ABNORMAL LOW (ref 22–32)
Calcium: 8.4 mg/dL — ABNORMAL LOW (ref 8.9–10.3)
Chloride: 109 mmol/L (ref 98–111)
Creatinine, Ser: 0.91 mg/dL (ref 0.61–1.24)
GFR calc Af Amer: >60 mL/min (ref >60)
GFR calc non Af Amer: >60 mL/min (ref >60)
Glucose, Bld: 138 mg/dL — ABNORMAL HIGH (ref 70–99)
Potassium: 4.2 mmol/L (ref 3.5–5.1)
Sodium: 139 mmol/L (ref 135–145)
Total Bilirubin: 1.2 mg/dL (ref 0.3–1.2)
Total Protein: 6.6 g/dL (ref 6.5–8.1)

## 2020-04-10 LAB — FERRITIN: Ferritin: 1349 ng/mL — ABNORMAL HIGH (ref 24–336)

## 2020-04-10 LAB — MAGNESIUM: Magnesium: 2 mg/dL (ref 1.7–2.4)

## 2020-04-10 LAB — PROCALCITONIN: Procalcitonin: <0.1 ng/mL

## 2020-04-10 LAB — PHOSPHORUS: Phosphorus: 3.2 mg/dL (ref 2.5–4.6)

## 2020-04-10 LAB — C-REACTIVE PROTEIN: CRP: 0.9 mg/dL (ref <1.0)

## 2020-04-10 MED ORDER — SODIUM CHLORIDE 0.9 % IV SOLN
100.0000 mg | Freq: Once | INTRAVENOUS | Status: AC
  Administered 2020-04-10: 100 mg via INTRAVENOUS
  Filled 2020-04-10: qty 20

## 2020-04-10 NOTE — Progress Notes (Addendum)
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 He says that he tested positive this morning at an outside facility and was sent to the emergency department for symptoms of shortness of breath cough and low oxygen saturation and weakness. He also was having chest discomfort. His significant other has also tested positive for COVID-19.  8/25:Patient appears to be doing well this morning and is currently on 10L high flow nasal cannula oxygen.  8/26: Patient continues to remain on 10 L high flow nasal cannula oxygen and cannot be weaned.  He denies any complaints.  8/27: Patient has unfortunately worsened overnight with increased oxygen demand noted.  He is currently on 40 L heated high flow oxygen this morning.  He is still having oxygen saturations in the high 88th percentile and is requiring side-lying position as well as prone to assist.  He denies any significant dyspnea however.  Assessment & Plan:   Principal Problem:   Acute respiratory failure with hypoxia (HCC) Active Problems:   Chronic systolic heart failure (HCC)   Coronary artery disease involving native coronary artery of native heart without angina pectoris   Ischemic cardiomyopathy   S/P CABG x 5   Mixed hyperlipidemia   Type 2 diabetes mellitus (HCC)   Hepatic cirrhosis (HCC)   Pneumonia due to COVID-19 virus   AKI (acute kidney injury) ([place])   COVID-19   1. Acute respiratory failure with hypoxia secondary to Covid pneumonia-worsening-patientwas initiallyrequiring 4 L nasal cannula oxygenbut unfortunately his oxygen needs are increasing and he is now on  heated high flow oxygen.Baricitinib 4 mg daily x 14 daysstarted on 8/24for emergency use authorization. GFR>60 today. He remains on IV remdesivirandSolu-Medrol IV. Continuevitamins and monitor inflammatory markerswhich are downtrending.  Plan to transfer to [facility] stepdown for further care given escalation in oxygen requirements.  Plan to check chest x-ray today.  Will check procalcitonin as well. 2. Acute kidney injury-suspect prerenal as he takes Lasix daily.This is now resolved.Marland Kitchen He is being gently hydrated given his history of CHF. Follow BMP. Renally dose medications as appropriate.    Creatinine levels have improved.  He does not appear volume overloaded.  Plan to check chest x-ray.  Hold Lasix for now. 3. Hepatic cirrhosis-recently had liver biopsy. He has follow-up with Dr. Laural Golden for GI. 4. Coronary artery disease status post CABG-resume heart medications. 5. Cardiomyopathy with history of systolic congestive heart failure-his most recent echo showed improvement in LV function to 60%. 6. Type 2 diabetes mellitus-he has presented with hyperglycemia likely steroid-induced. Continue supplemental sliding scale coverage and CBG monitoringand basal bolus insulin.check CBC 5 x per day.A1c7.9%.  DVT prophylaxis:lovenox Code Status:Full Family Communication:wife updated 8/23, 8/24, 8/25, 8/26, 8/27 Disposition Plan:TBD Consults called: Admission status:INP Status is: Inpatient  Remains inpatient appropriate because:IV treatments appropriate due to intensity of illness or inability to take PO and Inpatient level of care appropriate due to severity of illness. Pt continues to have increasing oxygen requirements at rest. Treatment escalated to include baricitinib.   Dispo: The patient is from:Home Anticipated d/c is TO:[facility] Anticipated d/c date is: > 3 days Patient currently is not medically stable to  d/c.  Consultants:  None  Procedures:  See below  Antimicrobials:  Anti-infectives (From admission, onward)   Start     Dose/Rate Route Frequency Ordered Stop   04/07/20 1000  remdesivir 100 mg in sodium chloride 0.9 % 100 mL IVPB       "Followed by" Linked Group Details   100 mg 200 mL/hr over 30 Minutes Intravenous Daily 04/12/2020 1348 04/11/20 0959   04/13/2020 1400  remdesivir 100 mg in sodium chloride 0.9 % 100 mL IVPB       "Followed by" Linked Group Details   100 mg 200 mL/hr over 30 Minutes Intravenous Every 1 hr x 2 04/04/2020 1348 03/28/2020 1559       Subjective: Patient seen and evaluated today with increased oxygen requirements noted overnight and he is currently on 40 L heated high flow nasal cannula oxygen.  He denies any chest pain or cough or dyspnea.  Objective: Vitals:   04/10/20 0602 04/10/20 0700 04/10/20 0759 04/10/20 0800  BP:  (!) 149/78    Pulse:  73    Resp:  (!) 9    Temp:    97.9 F (36.6 C)  TempSrc:    Oral  SpO2: (!) 87% (!) 87% 92%   Weight:      Height:        Intake/Output Summary (Last 24 hours) at 04/10/2020 0825 Last data filed at 04/10/2020 0500 Gross per 24 hour  Intake --  Output 1000 ml  Net -1000 ml   Filed Weights   04/07/20 1738 04/09/20 0400 04/10/20 0500  Weight: 96.6 kg 96.6 kg 95.9 kg    Examination:  General exam: Appears calm and comfortable  Respiratory system: Clear to auscultation. Respiratory effort normal.  Currently on 40 L heated high flow nasal cannula oxygen. Cardiovascular system: S1 & S2 heard, RRR.  Gastrointestinal system: Abdomen is nondistended, soft and nontender.  Central nervous system: Alert and oriented. No focal neurological deficits. Extremities: Symmetric 5 x 5 power. Skin: No rashes, lesions or ulcers Psychiatry: Judgement and insight appear normal. Mood & affect appropriate.     Data Reviewed: I have personally reviewed following labs and imaging studies  CBC: Recent Labs  Lab  04/11/2020 1159 04/07/20 0346 04/08/20 0501 04/09/20 0438 04/10/20 0443  WBC 3.3* 3.5* 8.5 10.8* 8.4  NEUTROABS 2.2 2.4 6.6 8.2* 6.7  HGB 12.1* 11.8* 11.7* 11.9* 12.5*  HCT 37.1* 37.0* 35.9* 36.3* 37.9*  MCV 86.7 86.4 86.7 87.1 84.8  PLT 76* 76* 94* 106* 270*   Basic Metabolic Panel: Recent Labs  Lab 03/26/2020 1159 04/07/20 0346 04/08/20 0501 04/09/20 0438 04/10/20 0443  NA 131* 132* 138 137 139  K 3.8 4.1 4.0 3.9 4.2  CL 99 104 108 106 109  CO2 21* 19* 21* 21* 20*  GLUCOSE 248* 356* 244* 156* 138*  BUN 46* 40* 41* 44* 34*  CREATININE 1.73* 1.25* 1.11 1.21 0.91  CALCIUM 8.4* 8.3* 8.4* 8.3* 8.4*  MG  --  2.0 1.9 1.9 2.0  PHOS  --  3.1 3.5 2.6 3.2   GFR: Estimated Creatinine Clearance: 89.1 mL/min (by C-G formula based on SCr of 0.91 mg/dL). Liver Function Tests: Recent Labs  Lab 03/31/2020 1159 04/07/20 0346 04/08/20 0501 04/09/20 0438 04/10/20 0443  AST 92* 111* 81* 78* 64*  ALT 74* 96* 87* 91* 81*  ALKPHOS 68 67 58 63 65  BILITOT 1.5* 1.0 1.1 1.0 1.2  PROT 7.5 6.8 6.6 6.6 6.6  ALBUMIN 3.2* 2.8* 2.7* 2.8* 2.8*   No  results for input(s): LIPASE, AMYLASE in the last 168 hours. No results for input(s): AMMONIA in the last 168 hours. Coagulation Profile: No results for input(s): INR, PROTIME in the last 168 hours. Cardiac Enzymes: No results for input(s): CKTOTAL, CKMB, CKMBINDEX, TROPONINI in the last 168 hours. BNP (last 3 results) No results for input(s): PROBNP in the last 8760 hours. HbA1C: No results for input(s): HGBA1C in the last 72 hours. CBG: Recent Labs  Lab 04/09/20 1159 04/09/20 1723 04/09/20 2110 04/10/20 0211 04/10/20 0801  GLUCAP 247* 139* 137* 133* 163*   Lipid Profile: No results for input(s): CHOL, HDL, LDLCALC, TRIG, CHOLHDL, LDLDIRECT in the last 72 hours. Thyroid Function Tests: No results for input(s): TSH, T4TOTAL, FREET4, T3FREE, THYROIDAB in the last 72 hours. Anemia Panel: Recent Labs    04/09/20 0438 04/10/20 0443    FERRITIN 1,490* 1,349*   Sepsis Labs: Recent Labs  Lab 03/28/2020 1159 03/30/2020 1414  PROCALCITON <0.10  --   LATICACIDVEN 2.3* 1.7    Recent Results (from the past 240 hour(s))  Blood Culture (routine x 2)     Status: None (Preliminary result)   Collection Time: 04/08/2020 11:50 AM   Specimen: BLOOD  Result Value Ref Range Status   Specimen Description BLOOD LEFT ANTECUBITAL  Final   Special Requests   Final    BOTTLES DRAWN AEROBIC AND ANAEROBIC Blood Culture results may not be optimal due to an inadequate volume of blood received in culture bottles   Culture   Final    NO GROWTH 4 DAYS Performed at [facility], [address] [place], [place] [address]    Report Status PENDING  Incomplete  Blood Culture (routine x 2)     Status: None (Preliminary result)   Collection Time: 03/25/2020 11:59 AM   Specimen: BLOOD LEFT HAND  Result Value Ref Range Status   Specimen Description BLOOD LEFT HAND  Final   Special Requests   Final    BOTTLES DRAWN AEROBIC AND ANAEROBIC Blood Culture results may not be optimal due to an inadequate volume of blood received in culture bottles   Culture   Final    NO GROWTH 4 DAYS Performed at [facility], [address] [place], [place] [address]    Report Status PENDING  Incomplete  SARS Coronavirus 2 by RT PCR (hospital order, performed in [place] hospital lab) Nasopharyngeal Nasopharyngeal Swab     Status: Abnormal   Collection Time: 03/28/2020  1:38 PM   Specimen: Nasopharyngeal Swab  Result Value Ref Range Status   SARS Coronavirus 2 POSITIVE (A) NEGATIVE Final    Comment: RESULT CALLED TO, READ BACK BY AND VERIFIED WITH: JENNIFER KENDRICK,RN @1641  04/04/2020 KAY (NOTE) SARS-CoV-2 target nucleic acids are DETECTED  SARS-CoV-2 RNA is generally detectable in upper respiratory specimens  during the acute phase of infection.  Positive results are indicative  of the presence of the identified virus, but do not rule out bacterial infection or  co-infection with other pathogens not detected by the test.  Clinical correlation with patient history and  other diagnostic information is necessary to determine patient infection status.  The expected result is negative.  Fact Sheet for Patients:   StrictlyIdeas.no   Fact Sheet for Healthcare Providers:   BankingDealers.co.za    This test is not yet approved or cleared by the [place] FDA and  has been authorized for detection and/or diagnosis of SARS-CoV-2 by FDA under an Emergency Use Authorization (EUA).  This EUA will remain in  effect (meaning  this test can be used) for the duration of  the COVID-19 declaration under Section 564(b)(1) of the Act, 21 U.S.C. section 360-bbb-3(b)(1), unless the authorization is terminated or revoked sooner.  Performed at [facility], [address] [place], [place] [address]   MRSA PCR Screening     Status: None   Collection Time: 04/07/20  6:35 PM   Specimen: Nasal Mucosa; Nasopharyngeal  Result Value Ref Range Status   MRSA by PCR NEGATIVE NEGATIVE Final    Comment:        The GeneXpert MRSA Assay (FDA approved for NASAL specimens only), is one component of a comprehensive MRSA colonization surveillance program. It is not intended to diagnose MRSA infection nor to guide or monitor treatment for MRSA infections. Performed at [facility], [address], [place], [place] [address]          Radiology Studies: No results found.      Scheduled Meds: . vitamin C  500 mg Oral Daily  . aspirin EC  81 mg Oral Daily  . atorvastatin  80 mg Oral q1800  . baricitinib  4 mg Oral Daily  . carvedilol  3.125 mg Oral BID WC  . Chlorhexidine Gluconate Cloth  6 each Topical Daily  . enoxaparin (LOVENOX) injection  40 mg Subcutaneous Q24H  . insulin aspart  0-15 Units Subcutaneous TID WC  . insulin aspart  0-5 Units Subcutaneous QHS  . insulin aspart  12 Units Subcutaneous TID WC  .  insulin detemir  18 Units Subcutaneous BID  . Ipratropium-Albuterol  1 puff Inhalation Q6H  . methylPREDNISolone (SOLU-MEDROL) injection  0.5 mg/kg Intravenous Q12H  . pantoprazole  40 mg Oral QAC breakfast  . zinc sulfate  220 mg Oral Daily   Continuous Infusions: . remdesivir 100 mg in NS 100 mL 100 mg (04/09/20 1059)     LOS: 4 days    Time spent: 35 minutes    Marcell Chavarin Darleen Crocker, DO [facility]  If 7PM-7AM, please contact night-coverage [email or web address] 04/10/2020, 8:25 AM

## 2020-04-10 NOTE — Care Management Important Message (Signed)
Important Message  Patient Details  Name: Alexander Parks MRN: [id number] Date of Birth: [birth year]   Medicare Important Message Given:  Yes - Important Message mailed due to current National Emergency     Tommy Medal 04/10/2020, 4:17 PM

## 2020-04-11 LAB — CBC WITH DIFFERENTIAL/PLATELET
Abs Immature Granulocytes: 0.2 10*3/uL — ABNORMAL HIGH (ref 0.00–0.07)
Basophils Absolute: 0 10*3/uL (ref 0.0–0.1)
Basophils Relative: 0 %
Eosinophils Absolute: 0 10*3/uL (ref 0.0–0.5)
Eosinophils Relative: 0 %
HCT: 39.1 % (ref 39.0–52.0)
Hemoglobin: 12.4 g/dL — ABNORMAL LOW (ref 13.0–17.0)
Immature Granulocytes: 2 %
Lymphocytes Relative: 10 %
Lymphs Abs: 1.1 10*3/uL (ref 0.7–4.0)
MCH: 27.7 pg (ref 26.0–34.0)
MCHC: 31.7 g/dL (ref 30.0–36.0)
MCV: 87.5 fL (ref 80.0–100.0)
Monocytes Absolute: 0.8 10*3/uL (ref 0.1–1.0)
Monocytes Relative: 7 %
Neutro Abs: 9.2 10*3/uL — ABNORMAL HIGH (ref 1.7–7.7)
Neutrophils Relative %: 81 %
Platelets: 123 10*3/uL — ABNORMAL LOW (ref 150–400)
RBC: 4.47 MIL/uL (ref 4.22–5.81)
RDW: 16.3 % — ABNORMAL HIGH (ref 11.5–15.5)
WBC: 11.2 10*3/uL — ABNORMAL HIGH (ref 4.0–10.5)
nRBC: 0.2 % (ref 0.0–0.2)

## 2020-04-11 LAB — GLUCOSE, CAPILLARY
Glucose-Capillary: 78 mg/dL (ref 70–99)
Glucose-Capillary: 93 mg/dL (ref 70–99)
Glucose-Capillary: 171 mg/dL — ABNORMAL HIGH (ref 70–99)
Glucose-Capillary: 164 mg/dL — ABNORMAL HIGH (ref 70–99)
Glucose-Capillary: 134 mg/dL — ABNORMAL HIGH (ref 70–99)

## 2020-04-11 LAB — COMPREHENSIVE METABOLIC PANEL
ALT: 74 U/L — ABNORMAL HIGH (ref 0–44)
AST: 60 U/L — ABNORMAL HIGH (ref 15–41)
Albumin: 2.8 g/dL — ABNORMAL LOW (ref 3.5–5.0)
Alkaline Phosphatase: 65 U/L (ref 38–126)
Anion gap: 9 (ref 5–15)
BUN: 35 mg/dL — ABNORMAL HIGH (ref 8–23)
CO2: 22 mmol/L (ref 22–32)
Calcium: 8.4 mg/dL — ABNORMAL LOW (ref 8.9–10.3)
Chloride: 108 mmol/L (ref 98–111)
Creatinine, Ser: 0.91 mg/dL (ref 0.61–1.24)
GFR calc Af Amer: >60 mL/min (ref >60)
GFR calc non Af Amer: >60 mL/min (ref >60)
Glucose, Bld: 85 mg/dL (ref 70–99)
Potassium: 3.8 mmol/L (ref 3.5–5.1)
Sodium: 139 mmol/L (ref 135–145)
Total Bilirubin: 1.6 mg/dL — ABNORMAL HIGH (ref 0.3–1.2)
Total Protein: 6.6 g/dL (ref 6.5–8.1)

## 2020-04-11 LAB — C-REACTIVE PROTEIN: CRP: 1 mg/dL — ABNORMAL HIGH (ref <1.0)

## 2020-04-11 LAB — CULTURE, BLOOD (ROUTINE X 2)
Culture: NO GROWTH
Culture: NO GROWTH

## 2020-04-11 LAB — PHOSPHORUS: Phosphorus: 3.4 mg/dL (ref 2.5–4.6)

## 2020-04-11 LAB — PROCALCITONIN
Procalcitonin: <0.1 ng/mL
Procalcitonin: <0.1 ng/mL

## 2020-04-11 LAB — MAGNESIUM: Magnesium: 2 mg/dL (ref 1.7–2.4)

## 2020-04-11 LAB — FERRITIN: Ferritin: 974 ng/mL — ABNORMAL HIGH (ref 24–336)

## 2020-04-11 LAB — D-DIMER, QUANTITATIVE: D-Dimer, Quant: 1.02 ug/mL-FEU — ABNORMAL HIGH (ref 0.00–0.50)

## 2020-04-11 MED ORDER — ENOXAPARIN SODIUM 60 MG/0.6ML ~~LOC~~ SOLN
0.5000 mg/kg | Freq: Two times a day (BID) | SUBCUTANEOUS | Status: DC
  Administered 2020-04-11 – 2020-04-13 (×5): 45 mg via SUBCUTANEOUS
  Filled 2020-04-11 (×5): qty 0.6

## 2020-04-11 MED ORDER — ENSURE ENLIVE PO LIQD
237.0000 mL | Freq: Three times a day (TID) | ORAL | Status: DC
  Administered 2020-04-11 – 2020-04-13 (×5): 237 mL via ORAL

## 2020-04-11 NOTE — Progress Notes (Signed)
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 He says that he tested positive this morning at an outside facility and was sent to the emergency department for symptoms of shortness of breath cough and low oxygen saturation and weakness. He also was having chest discomfort. His significant other has also tested positive for COVID-19.  8/25:Patient appears to be doing well this morning and is currently on 10L high flow nasal cannula oxygen.  8/26:Patient continues to remain on 10 L high flow nasal cannula oxygen and cannot be weaned. He denies any complaints.  8/27: Patient has unfortunately worsened overnight with increased oxygen demand noted.  He is currently on 40 L heated high flow oxygen this morning.  He is still having oxygen saturations in the high 88th percentile and is requiring side-lying position as well as prone to assist.  He denies any significant dyspnea however.  8/28: Patient is awaiting transfer today.  He continues to require heated high flow oxygen and is between 30-35 L.  He continues to require side-lying and prone to assist with hypoxemia.  He is eating somewhat better today.  Nutritional supplements ordered to assist with nutritional status.  He denies any significant dyspnea.  Procalcitonin noted to be low today and inflammatory markers remain low as well as D-dimer.  Assessment & Plan:   Principal Problem:   Acute respiratory failure with hypoxia (HCC) Active Problems:   Chronic systolic heart failure (HCC)   Coronary artery disease involving native coronary artery of native heart without angina  pectoris   Ischemic cardiomyopathy   S/P CABG x 5   Mixed hyperlipidemia   Type 2 diabetes mellitus (HCC)   Hepatic cirrhosis (HCC)   Pneumonia due to COVID-19 virus   AKI (acute kidney injury) ([place])   COVID-19   1. Acute respiratory failure with hypoxia secondary to Covid pneumonia-worsening-patientwas initiallyrequiring 4 L nasal cannula oxygenbut unfortunately his oxygen needs are increasing and he is now on heated high flow oxygen.Baricitinib 4 mg daily x 14 daysstarted on 8/24for emergency use authorization. GFR>60 today. He remains on IV remdesivirandSolu-Medrol IV. Continuevitamins and monitor inflammatory markerswhich are downtrending.  Plan to [facility] stepdown for further care given escalation in oxygen requirements.    Procalcitonin remains low and chest x-ray with left-sided infiltrates indicative of atypical infection with no new findings. 2. Acute kidney injury-suspect prerenal as he takes Lasix daily.This is now resolved.Marland Kitchen He is being gently hydrated given his history of CHF. Follow BMP. Renally dose medications as appropriate.  Creatinine levels have improved.  He does not appear volume overloaded.    Chest x-ray with no signs of volume overload on 8/27.  Continue to hold Lasix with poor oral intake currently noted.  He does not appear volume overloaded. 3. Hepatic cirrhosis-recently had liver biopsy. He has follow-up with Dr. Laural Golden for GI. 4. Coronary artery disease status post CABG-resume heart medications. 5. Cardiomyopathy with history of systolic congestive heart failure-his most recent echo showed improvement in LV function to 60%. 6. Type 2 diabetes mellitus-he has presented with hyperglycemia likely steroid-induced. Continue supplemental sliding scale coverage and CBG monitoringand basal bolus insulin.check CBC 5 x per  day.A1c7.9%.  DVT prophylaxis:lovenox Code Status:Full Family Communication:wife updated 8/23,  8/24, 8/25, 8/26, 8/27, 8/28 Disposition Plan:TBD Consults called: Admission status:INP Status is: Inpatient  Remains inpatient appropriate because:IV treatments appropriate due to intensity of illness or inability to take PO and Inpatient level of care appropriate due to severity of illness. Pt continues to have increasing oxygen requirements at rest. Treatment escalated to include baricitinib.   Dispo: The patient is from:Home Anticipated d/c is GU:RKYH Anticipated d/c date is: > 3 days Patient currently is not medically stable to d/c.  Consultants:  None  Procedures:  See below  Antimicrobials:  Anti-infectives (From admission, onward)   Start     Dose/Rate Route Frequency Ordered Stop   04/10/20 1315  remdesivir 100 mg in sodium chloride 0.9 % 100 mL IVPB        100 mg 200 mL/hr over 30 Minutes Intravenous  Once 04/10/20 1301 04/10/20 1355   04/07/20 1000  remdesivir 100 mg in sodium chloride 0.9 % 100 mL IVPB  Status:  Discontinued       "Followed by" Linked Group Details   100 mg 200 mL/hr over 30 Minutes Intravenous Daily 03/18/2020 1348 04/10/20 1301   03/21/2020 1400  remdesivir 100 mg in sodium chloride 0.9 % 100 mL IVPB       "Followed by" Linked Group Details   100 mg 200 mL/hr over 30 Minutes Intravenous Every 1 hr x 2 03/26/2020 1348 04/03/2020 1559      Subjective: Patient seen and evaluated today with no new acute complaints or concerns. No acute concerns or events noted overnight.  He continues to have significant hypoxemia requiring 30-35 L high flow nasal cannula oxygen.  Objective: Vitals:   04/11/20 0400 04/11/20 0600 04/11/20 0759 04/11/20 1000  BP: (!) 159/79 (!) 146/67  (!) 157/90  Pulse: 71 60 71 91  Resp: (!) 34 15 15 20   Temp:   (!) 97.1 F (36.2 C)   TempSrc:   Axillary   SpO2: 94% 95% 93% (!) 84%  Weight:      Height:        Intake/Output Summary (Last 24 hours) at 04/11/2020 1132 Last  data filed at 04/10/2020 1600 Gross per 24 hour  Intake 340 ml  Output 350 ml  Net -10 ml   Filed Weights   04/07/20 1738 04/09/20 0400 04/10/20 0500  Weight: 96.6 kg 96.6 kg 95.9 kg    Examination:  General exam: Appears calm and comfortable  Respiratory system: Clear to auscultation. Respiratory effort normal.  Currently on 30-35-35 L high flow nasal cannula oxygen in side-lying position. Cardiovascular system: S1 & S2 heard, RRR.  Gastrointestinal system: Abdomen is nondistended, soft and nontender.  Central nervous system: Alert and oriented. No focal neurological deficits. Extremities: Symmetric 5 x 5 power. Skin: No rashes, lesions or ulcers Psychiatry: Flat affect.    Data Reviewed: I have personally reviewed following labs and imaging studies  CBC: Recent Labs  Lab 04/07/20 0346 04/08/20 0501 04/09/20 0438 04/10/20 0443 04/11/20 0446  WBC 3.5* 8.5 10.8* 8.4 11.2*  NEUTROABS 2.4 6.6 8.2* 6.7 9.2*  HGB 11.8* 11.7* 11.9* 12.5* 12.4*  HCT 37.0* 35.9* 36.3* 37.9* 39.1  MCV 86.4 86.7 87.1 84.8 87.5  PLT 76* 94* 106* 100* 062*   Basic Metabolic Panel: Recent Labs  Lab 04/07/20 0346 04/08/20 0501 04/09/20 0438 04/10/20 0443 04/11/20 0446  NA 132* 138 137 139 139  K 4.1 4.0 3.9 4.2 3.8  CL 104 108 106 109  108  CO2 19* 21* 21* 20* 22  GLUCOSE 356* 244* 156* 138* 85  BUN 40* 41* 44* 34* 35*  CREATININE 1.25* 1.11 1.21 0.91 0.91  CALCIUM 8.3* 8.4* 8.3* 8.4* 8.4*  MG 2.0 1.9 1.9 2.0 2.0  PHOS 3.1 3.5 2.6 3.2 3.4   GFR: Estimated Creatinine Clearance: 89.1 mL/min (by C-G formula based on SCr of 0.91 mg/dL). Liver Function Tests: Recent Labs  Lab 04/07/20 0346 04/08/20 0501 04/09/20 0438 04/10/20 0443 04/11/20 0446  AST 111* 81* 78* 64* 60*  ALT 96* 87* 91* 81* 74*  ALKPHOS 67 58 63 65 65  BILITOT 1.0 1.1 1.0 1.2 1.6*  PROT 6.8 6.6 6.6 6.6 6.6  ALBUMIN 2.8* 2.7* 2.8* 2.8* 2.8*   No results for input(s): LIPASE, AMYLASE in the last 168 hours. No  results for input(s): AMMONIA in the last 168 hours. Coagulation Profile: No results for input(s): INR, PROTIME in the last 168 hours. Cardiac Enzymes: No results for input(s): CKTOTAL, CKMB, CKMBINDEX, TROPONINI in the last 168 hours. BNP (last 3 results) No results for input(s): PROBNP in the last 8760 hours. HbA1C: No results for input(s): HGBA1C in the last 72 hours. CBG: Recent Labs  Lab 04/10/20 1143 04/10/20 1609 04/10/20 2032 04/11/20 0256 04/11/20 0757  GLUCAP 158* 110* 83 78 93   Lipid Profile: No results for input(s): CHOL, HDL, LDLCALC, TRIG, CHOLHDL, LDLDIRECT in the last 72 hours. Thyroid Function Tests: No results for input(s): TSH, T4TOTAL, FREET4, T3FREE, THYROIDAB in the last 72 hours. Anemia Panel: Recent Labs    04/10/20 0443 04/11/20 0447  FERRITIN 1,349* 974*   Sepsis Labs: Recent Labs  Lab 04/12/2020 1159 03/15/2020 1414 04/10/20 0443 04/11/20 0446  PROCALCITON <0.10  --  <0.10 <0.10  LATICACIDVEN 2.3* 1.7  --   --     Recent Results (from the past 240 hour(s))  Blood Culture (routine x 2)     Status: None (Preliminary result)   Collection Time: 03/30/2020 11:50 AM   Specimen: BLOOD  Result Value Ref Range Status   Specimen Description BLOOD LEFT ANTECUBITAL  Final   Special Requests   Final    BOTTLES DRAWN AEROBIC AND ANAEROBIC Blood Culture results may not be optimal due to an inadequate volume of blood received in culture bottles   Culture   Final    NO GROWTH 4 DAYS Performed at [facility], [address] [place], [place] [address]    Report Status PENDING  Incomplete  Blood Culture (routine x 2)     Status: None (Preliminary result)   Collection Time: 04/02/2020 11:59 AM   Specimen: BLOOD LEFT HAND  Result Value Ref Range Status   Specimen Description BLOOD LEFT HAND  Final   Special Requests   Final    BOTTLES DRAWN AEROBIC AND ANAEROBIC Blood Culture results may not be optimal due to an inadequate volume of blood received in culture  bottles   Culture   Final    NO GROWTH 4 DAYS Performed at [facility], [address], [place], [place] [address]    Report Status PENDING  Incomplete  SARS Coronavirus 2 by RT PCR (hospital order, performed in [place] hospital lab) Nasopharyngeal Nasopharyngeal Swab     Status: Abnormal   Collection Time: 04/03/2020  1:38 PM   Specimen: Nasopharyngeal Swab  Result Value Ref Range Status   SARS Coronavirus 2 POSITIVE (A) NEGATIVE Final    Comment: RESULT CALLED TO, READ BACK BY AND VERIFIED WITH: Parthenia Ames @  1641 04/04/2020 KAY (NOTE) SARS-CoV-2 target nucleic acids are DETECTED  SARS-CoV-2 RNA is generally detectable in upper respiratory specimens  during the acute phase of infection.  Positive results are indicative  of the presence of the identified virus, but do not rule out bacterial infection or co-infection with other pathogens not detected by the test.  Clinical correlation with patient history and  other diagnostic information is necessary to determine patient infection status.  The expected result is negative.  Fact Sheet for Patients:   StrictlyIdeas.no   Fact Sheet for Healthcare Providers:   BankingDealers.co.za    This test is not yet approved or cleared by the [place] FDA and  has been authorized for detection and/or diagnosis of SARS-CoV-2 by FDA under an Emergency Use Authorization (EUA).  This EUA will remain in effect (meaning  this test can be used) for the duration of  the COVID-19 declaration under Section 564(b)(1) of the Act, 21 U.S.C. section 360-bbb-3(b)(1), unless the authorization is terminated or revoked sooner.  Performed at [facility], [address] [place], [place] [address]   MRSA PCR Screening     Status: None   Collection Time: 04/07/20  6:35 PM   Specimen: Nasal Mucosa; Nasopharyngeal  Result Value Ref Range Status   MRSA by PCR NEGATIVE NEGATIVE Final    Comment:         The GeneXpert MRSA Assay (FDA approved for NASAL specimens only), is one component of a comprehensive MRSA colonization surveillance program. It is not intended to diagnose MRSA infection nor to guide or monitor treatment for MRSA infections. Performed at [facility], [address] [place], [place] [address]          Radiology Studies: DG Chest [facility] 1 View  Result Date: 04/10/2020 CLINICAL DATA:  Chest pain, shortness of breath and low oxygen saturation, recent diagnosis of COVID-19, diabetes mellitus, former smoker, cirrhosis, coronary artery disease post CABG EXAM: PORTABLE CHEST 1 VIEW COMPARISON:  Portable exam 1057 hours compared to 03/23/2020 FINDINGS: Upper normal heart size post CABG. Tortuous aorta with minimal atherosclerotic calcification. Pulmonary vascularity normal. Interstitial infiltrates LEFT lung question atypical infection. No pleural effusion or pneumothorax. Osseous structures unremarkable. IMPRESSION: Infiltrates in mid to inferior LEFT lung question atypical infection. Electronically Signed   By: Lavonia Dana M.D.   On: 04/10/2020 11:23        Scheduled Meds: . vitamin C  500 mg Oral Daily  . aspirin EC  81 mg Oral Daily  . atorvastatin  80 mg Oral q1800  . baricitinib  4 mg Oral Daily  . carvedilol  3.125 mg Oral BID WC  . Chlorhexidine Gluconate Cloth  6 each Topical Daily  . enoxaparin (LOVENOX) injection  0.5 mg/kg (Adjusted) Subcutaneous Q12H  . feeding supplement (ENSURE ENLIVE)  237 mL Oral TID BM  . insulin aspart  0-15 Units Subcutaneous TID WC  . insulin aspart  0-5 Units Subcutaneous QHS  . insulin aspart  12 Units Subcutaneous TID WC  . insulin detemir  18 Units Subcutaneous BID  . Ipratropium-Albuterol  1 puff Inhalation Q6H  . methylPREDNISolone (SOLU-MEDROL) injection  0.5 mg/kg Intravenous Q12H  . pantoprazole  40 mg Oral QAC breakfast  . zinc sulfate  220 mg Oral Daily    LOS: 5 days    Time spent: 45 minutes    Cydne Grahn Darleen Crocker, DO [facility]  If 7PM-7AM, please contact night-coverage [email or web address] 04/11/2020, 11:32 AM

## 2020-04-11 NOTE — Progress Notes (Signed)
Patient is totally prone saturation increased to 98

## 2020-04-12 LAB — COMPREHENSIVE METABOLIC PANEL
ALT: 78 U/L — ABNORMAL HIGH (ref 0–44)
AST: 65 U/L — ABNORMAL HIGH (ref 15–41)
Albumin: 2.7 g/dL — ABNORMAL LOW (ref 3.5–5.0)
Alkaline Phosphatase: 74 U/L (ref 38–126)
Anion gap: 9 (ref 5–15)
BUN: 40 mg/dL — ABNORMAL HIGH (ref 8–23)
CO2: 21 mmol/L — ABNORMAL LOW (ref 22–32)
Calcium: 8.2 mg/dL — ABNORMAL LOW (ref 8.9–10.3)
Chloride: 106 mmol/L (ref 98–111)
Creatinine, Ser: 0.99 mg/dL (ref 0.61–1.24)
GFR calc Af Amer: >60 mL/min (ref >60)
GFR calc non Af Amer: >60 mL/min (ref >60)
Glucose, Bld: 188 mg/dL — ABNORMAL HIGH (ref 70–99)
Potassium: 4.2 mmol/L (ref 3.5–5.1)
Sodium: 136 mmol/L (ref 135–145)
Total Bilirubin: 1.9 mg/dL — ABNORMAL HIGH (ref 0.3–1.2)
Total Protein: 6.8 g/dL (ref 6.5–8.1)

## 2020-04-12 LAB — CBC
HCT: 38.7 % — ABNORMAL LOW (ref 39.0–52.0)
Hemoglobin: 12.6 g/dL — ABNORMAL LOW (ref 13.0–17.0)
MCH: 28.3 pg (ref 26.0–34.0)
MCHC: 32.6 g/dL (ref 30.0–36.0)
MCV: 86.8 fL (ref 80.0–100.0)
Platelets: 127 10*3/uL — ABNORMAL LOW (ref 150–400)
RBC: 4.46 MIL/uL (ref 4.22–5.81)
RDW: 16.7 % — ABNORMAL HIGH (ref 11.5–15.5)
WBC: 12.2 10*3/uL — ABNORMAL HIGH (ref 4.0–10.5)
nRBC: 0 % (ref 0.0–0.2)

## 2020-04-12 LAB — GLUCOSE, CAPILLARY
Glucose-Capillary: 192 mg/dL — ABNORMAL HIGH (ref 70–99)
Glucose-Capillary: 208 mg/dL — ABNORMAL HIGH (ref 70–99)
Glucose-Capillary: 343 mg/dL — ABNORMAL HIGH (ref 70–99)
Glucose-Capillary: 200 mg/dL — ABNORMAL HIGH (ref 70–99)
Glucose-Capillary: 109 mg/dL — ABNORMAL HIGH (ref 70–99)

## 2020-04-12 LAB — PROCALCITONIN: Procalcitonin: <0.1 ng/mL

## 2020-04-12 NOTE — Progress Notes (Signed)
NURSING PROGRESS NOTE  JAMEIS NEWSHAM [id number] Transfer Data: 04/12/2020 7:26 PM Attending Provider: Rodena Goldmann, DO VVZ:SMOLMB, Silvestre Moment, MD Code Status: FULL   JRUE JARRIEL is a 69 y.o. male patient transferred from [facility]  -No acute distress noted.  -No complaints of shortness of breath.  -No complaints of chest pain.   Cardiac Monitoring: Cardiac monitor yields:normal sinus rhythm.  Last Documented Vital Signs: Blood pressure 108/84, pulse 66, temperature 97.7 F (36.5 C), temperature source Oral, resp. rate 19, height 6\' 2"  (1.88 m), weight 95.9 kg, SpO2 95 %.  IV Fluids:  IV in place, occlusive dsg intact without redness, IV cath forearm right, condition patent and no redness IV push only, no IV fluids.   Allergies:  Benadryl [diphenhydramine hcl], Iodinated diagnostic agents, and Morphine and related  Past Medical History:   has a past medical history of Allergy, Cirrhosis ([place]), Coronary artery disease, Diabetes mellitus without complication ([place]), and History of kidney stones.  Past Surgical History:   has a past surgical history that includes Hernia repair; Cystoscopy w/ ureteral stent placement (08/21/2012); Stone extraction with basket (08/21/2012); Holmium laser application (03/21/7543); Balloon dilation (08/21/2012); RIGHT/LEFT HEART CATH AND CORONARY ANGIOGRAPHY (N/A, 02/07/2018); Coronary artery bypass graft (N/A, 02/08/2018); Intraoprative transesophageal echocardiogram (N/A, 02/08/2018); Colonoscopy with propofol (N/A, 01/17/2020); Esophagogastroduodenoscopy (egd) with propofol (N/A, 01/17/2020); biopsy (01/17/2020); polypectomy (01/17/2020); Cholecystectomy (N/A, 02/12/2020); Liver biopsy (N/A, 05/04/1006); and Umbilical hernia repair (N/A, 02/12/2020).  Social History:   reports that he quit smoking about 23 years ago. His smoking use included cigarettes. He has a 30.00 pack-year smoking history. He has never used smokeless tobacco. He reports that he does not drink alcohol and  does not use drugs.  Skin: intact  Patient/Family orientated to room. Information packet given to patient/family. Admission inpatient armband information verified with patient/family to include name and date of birth and placed on patient arm. Side rails up x 2, fall assessment and education completed with patient/family. Patient/family able to verbalize understanding of risk associated with falls and verbalized understanding to call for assistance before getting out of bed. Call light within reach. Patient/family able to voice and demonstrate understanding of unit orientation instructions.

## 2020-04-12 NOTE — Progress Notes (Signed)
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 He says that he tested positive this morning at an outside facility and was sent to the emergency department for symptoms of shortness of breath cough and low oxygen saturation and weakness. He also was having chest discomfort. His significant other has also tested positive for COVID-19.  8/25:Patient appears to be doing well this morning and is currently on 10L high flow nasal cannula oxygen.  8/26:Patient continues to remain on 10 L high flow nasal cannula oxygen and cannot be weaned. He denies any complaints.  8/27:Patient has unfortunately worsened overnight with increased oxygen demand noted. He is currently on 40 L heated high flow oxygen this morning. He is still having oxygen saturations in the high 88th percentile and is requiring side-lying position as well as prone to assist. He denies any significant dyspnea however.  8/28: Patient is awaiting transfer today.  He continues to require heated high flow oxygen and is between 30-35 L.  He continues to require side-lying and prone to assist with hypoxemia.  He is eating somewhat better today.  Nutritional supplements ordered to assist with nutritional status.  He denies any significant dyspnea.  Procalcitonin noted to be low today and inflammatory markers remain low as well as D-dimer.  8/29: Patient is stable for transfer today with bed available at [facility].  He remains on 35 L heated high flow nasal cannula oxygen.  No other acute events noted overnight.  He has been eating somewhat better yesterday.  Assessment &  Plan:   Principal Problem:   Acute respiratory failure with hypoxia (HCC) Active Problems:   Chronic systolic heart failure (HCC)   Coronary artery disease involving native coronary artery of native heart without angina pectoris   Ischemic cardiomyopathy   S/P CABG x 5   Mixed hyperlipidemia   Type 2 diabetes mellitus (HCC)   Hepatic cirrhosis (HCC)   Pneumonia due to COVID-19 virus   AKI (acute kidney injury) ([place])   COVID-19   1. Acute respiratory failure with hypoxia secondary to Covid pneumonia-worsening-patientwas initiallyrequiring 4 L nasal cannula oxygenbut unfortunately his oxygen needs are increasing and he is now onheatedhigh flow oxygen of 35 L.Baricitinib 4 mg daily x 14 daysstarted on 8/24for emergency use authorization. GFR>60 today. He remains on IV remdesivirandSolu-Medrol IV. Continuevitamins and monitor inflammatory markerswhich are downtrending.Plan to transfer to [facility] stepdown for further care given escalation in oxygen requirements.  Procalcitonin remains low and chest x-ray with left-sided infiltrates indicative of atypical infection with no new findings. 2. Acute kidney injury-suspect prerenal as he takes Lasix daily.This is now resolved.Marland Kitchen He is being gently hydrated given his history of CHF. Follow BMP. Renally dose medications as appropriate.Creatinine levels have improved. He does not appear volume overloaded.   Chest x-ray with no signs of volume overload on 8/27.  Continue to hold Lasix with poor oral intake currently noted.  He does not appear volume overloaded.  May resume as needed. 3. Hepatic cirrhosis-recently had liver biopsy. He has follow-up with Dr. Laural Golden for GI. 4. Coronary artery disease status post CABG-resume heart medications. 5. Cardiomyopathy with history of systolic congestive heart failure-his most  recent echo showed improvement in LV function to 60%. 6. Type 2 diabetes mellitus-he has presented with  hyperglycemia likely steroid-induced. Continue supplemental sliding scale coverage and CBG monitoringand basal bolus insulin.check CBC 5 x per day.A1c7.9%.  DVT prophylaxis:lovenox Code Status:Full Family Communication:wife updated 8/23, 8/24, 8/25, 8/26, 8/27, 8/28, 8/29 Disposition Plan:TBD Consults called: Admission status:INP Status is: Inpatient  Remains inpatient appropriate because:IV treatments appropriate due to intensity of illness or inability to take PO and Inpatient level of care appropriate due to severity of illness. Pt continues to have increasing oxygen requirements at rest. Treatment escalated to include baricitinib.   Dispo: The patient is from:Home Anticipated d/c is RD:[facility] Anticipated d/c date is: > 3 days Patient currently is not medically stable to d/c.  Consultants:  None  Procedures:  See below  Antimicrobials:  Anti-infectives (From admission, onward)   Start     Dose/Rate Route Frequency Ordered Stop   04/10/20 1315  remdesivir 100 mg in sodium chloride 0.9 % 100 mL IVPB        100 mg 200 mL/hr over 30 Minutes Intravenous  Once 04/10/20 1301 04/10/20 1355   04/07/20 1000  remdesivir 100 mg in sodium chloride 0.9 % 100 mL IVPB  Status:  Discontinued       "Followed by" Linked Group Details   100 mg 200 mL/hr over 30 Minutes Intravenous Daily 03/21/2020 1348 04/10/20 1301   03/21/2020 1400  remdesivir 100 mg in sodium chloride 0.9 % 100 mL IVPB       "Followed by" Linked Group Details   100 mg 200 mL/hr over 30 Minutes Intravenous Every 1 hr x 2 04/01/2020 1348 03/31/2020 1559       Subjective: Patient seen and evaluated today with no new acute complaints or concerns. No acute concerns or events noted overnight.  He continues to remain on heated high flow nasal cannula oxygen.  He states he is feeling about the same as he did the prior day.  He has been eating some  more.  Objective: Vitals:   04/12/20 0200 04/12/20 0300 04/12/20 0400 04/12/20 0852  BP: (!) 126/41 128/66 139/75   Pulse: 75 84 83   Resp: 18 (!) 23 (!) 21   Temp:   97.9 F (36.6 C)   TempSrc:   Axillary   SpO2: 94% 94% 93% 90%  Weight:      Height:        Intake/Output Summary (Last 24 hours) at 04/12/2020 0857 Last data filed at 04/12/2020 0700 Gross per 24 hour  Intake --  Output 750 ml  Net -750 ml   Filed Weights   04/07/20 1738 04/09/20 0400 04/10/20 0500  Weight: 96.6 kg 96.6 kg 95.9 kg    Examination:  General exam: Appears calm and comfortable  Respiratory system: Clear to auscultation. Respiratory effort normal.  Currently on 35 L heated high flow nasal cannula oxygen. Cardiovascular system: S1 & S2 heard, RRR.  Gastrointestinal system: Abdomen is nondistended, soft and nontender. Central nervous system: Alert and oriented. No focal neurological deficits. Extremities: Symmetric 5 x 5 power. Skin: No rashes, lesions or ulcers Psychiatry: Judgement and insight appear normal. Mood & affect appropriate.     Data Reviewed: I have personally reviewed following labs and imaging studies  CBC: Recent Labs  Lab 04/07/20 0346 04/08/20 0501 04/09/20 0438 04/10/20 0443 04/11/20 0446  WBC 3.5* 8.5 10.8* 8.4 11.2*  NEUTROABS 2.4 6.6 8.2* 6.7 9.2*  HGB 11.8* 11.7* 11.9* 12.5* 12.4*  HCT 37.0*  35.9* 36.3* 37.9* 39.1  MCV 86.4 86.7 87.1 84.8 87.5  PLT 76* 94* 106* 100* 063*   Basic Metabolic Panel: Recent Labs  Lab 04/07/20 0346 04/08/20 0501 04/09/20 0438 04/10/20 0443 04/11/20 0446  NA 132* 138 137 139 139  K 4.1 4.0 3.9 4.2 3.8  CL 104 108 106 109 108  CO2 19* 21* 21* 20* 22  GLUCOSE 356* 244* 156* 138* 85  BUN 40* 41* 44* 34* 35*  CREATININE 1.25* 1.11 1.21 0.91 0.91  CALCIUM 8.3* 8.4* 8.3* 8.4* 8.4*  MG 2.0 1.9 1.9 2.0 2.0  PHOS 3.1 3.5 2.6 3.2 3.4   GFR: Estimated Creatinine Clearance: 89.1 mL/min (by C-G formula based on SCr of 0.91  mg/dL). Liver Function Tests: Recent Labs  Lab 04/07/20 0346 04/08/20 0501 04/09/20 0438 04/10/20 0443 04/11/20 0446  AST 111* 81* 78* 64* 60*  ALT 96* 87* 91* 81* 74*  ALKPHOS 67 58 63 65 65  BILITOT 1.0 1.1 1.0 1.2 1.6*  PROT 6.8 6.6 6.6 6.6 6.6  ALBUMIN 2.8* 2.7* 2.8* 2.8* 2.8*   No results for input(s): LIPASE, AMYLASE in the last 168 hours. No results for input(s): AMMONIA in the last 168 hours. Coagulation Profile: No results for input(s): INR, PROTIME in the last 168 hours. Cardiac Enzymes: No results for input(s): CKTOTAL, CKMB, CKMBINDEX, TROPONINI in the last 168 hours. BNP (last 3 results) No results for input(s): PROBNP in the last 8760 hours. HbA1C: No results for input(s): HGBA1C in the last 72 hours. CBG: Recent Labs  Lab 04/11/20 1150 04/11/20 1629 04/11/20 2039 04/12/20 0344 04/12/20 0758  GLUCAP 171* 164* 134* 192* 208*   Lipid Profile: No results for input(s): CHOL, HDL, LDLCALC, TRIG, CHOLHDL, LDLDIRECT in the last 72 hours. Thyroid Function Tests: No results for input(s): TSH, T4TOTAL, FREET4, T3FREE, THYROIDAB in the last 72 hours. Anemia Panel: Recent Labs    04/10/20 0443 04/11/20 0447  FERRITIN 1,349* 974*   Sepsis Labs: Recent Labs  Lab 04/09/2020 1159 03/28/2020 1414 04/10/20 0443 04/11/20 0446 04/11/20 1031  PROCALCITON <0.10  --  <0.10 <0.10 <0.10  LATICACIDVEN 2.3* 1.7  --   --   --     Recent Results (from the past 240 hour(s))  Blood Culture (routine x 2)     Status: None   Collection Time: 04/12/2020 11:50 AM   Specimen: BLOOD  Result Value Ref Range Status   Specimen Description BLOOD LEFT ANTECUBITAL  Final   Special Requests   Final    BOTTLES DRAWN AEROBIC AND ANAEROBIC Blood Culture results may not be optimal due to an inadequate volume of blood received in culture bottles   Culture   Final    NO GROWTH 5 DAYS Performed at [facility], [address] [place], [place] [address]    Report Status 04/11/2020 FINAL   Final  Blood Culture (routine x 2)     Status: None   Collection Time: 04/05/2020 11:59 AM   Specimen: BLOOD LEFT HAND  Result Value Ref Range Status   Specimen Description BLOOD LEFT HAND  Final   Special Requests   Final    BOTTLES DRAWN AEROBIC AND ANAEROBIC Blood Culture results may not be optimal due to an inadequate volume of blood received in culture bottles   Culture   Final    NO GROWTH 5 DAYS Performed at [facility], [address], [place], [place] [address]    Report Status 04/11/2020 FINAL  Final  SARS Coronavirus 2 by RT  PCR (hospital order, performed in [facility] hospital lab) Nasopharyngeal Nasopharyngeal Swab     Status: Abnormal   Collection Time: 03/15/2020  1:38 PM   Specimen: Nasopharyngeal Swab  Result Value Ref Range Status   SARS Coronavirus 2 POSITIVE (A) NEGATIVE Final    Comment: RESULT CALLED TO, READ BACK BY AND VERIFIED WITH: JENNIFER KENDRICK,RN @1641  03/30/2020 KAY (NOTE) SARS-CoV-2 target nucleic acids are DETECTED  SARS-CoV-2 RNA is generally detectable in upper respiratory specimens  during the acute phase of infection.  Positive results are indicative  of the presence of the identified virus, but do not rule out bacterial infection or co-infection with other pathogens not detected by the test.  Clinical correlation with patient history and  other diagnostic information is necessary to determine patient infection status.  The expected result is negative.  Fact Sheet for Patients:   StrictlyIdeas.no   Fact Sheet for Healthcare Providers:   BankingDealers.co.za    This test is not yet approved or cleared by the [place] FDA and  has been authorized for detection and/or diagnosis of SARS-CoV-2 by FDA under an Emergency Use Authorization (EUA).  This EUA will remain in effect (meaning  this test can be used) for the duration of  the COVID-19 declaration under Section 564(b)(1) of the Act,  21 U.S.C. section 360-bbb-3(b)(1), unless the authorization is terminated or revoked sooner.  Performed at [facility], [address] [place], [place] [address]   MRSA PCR Screening     Status: None   Collection Time: 04/07/20  6:35 PM   Specimen: Nasal Mucosa; Nasopharyngeal  Result Value Ref Range Status   MRSA by PCR NEGATIVE NEGATIVE Final    Comment:        The GeneXpert MRSA Assay (FDA approved for NASAL specimens only), is one component of a comprehensive MRSA colonization surveillance program. It is not intended to diagnose MRSA infection nor to guide or monitor treatment for MRSA infections. Performed at [facility], [address] [place], [place] [address]          Radiology Studies: DG Chest [facility] 1 View  Result Date: 04/10/2020 CLINICAL DATA:  Chest pain, shortness of breath and low oxygen saturation, recent diagnosis of COVID-19, diabetes mellitus, former smoker, cirrhosis, coronary artery disease post CABG EXAM: PORTABLE CHEST 1 VIEW COMPARISON:  Portable exam 1057 hours compared to 03/23/2020 FINDINGS: Upper normal heart size post CABG. Tortuous aorta with minimal atherosclerotic calcification. Pulmonary vascularity normal. Interstitial infiltrates LEFT lung question atypical infection. No pleural effusion or pneumothorax. Osseous structures unremarkable. IMPRESSION: Infiltrates in mid to inferior LEFT lung question atypical infection. Electronically Signed   By: Lavonia Dana M.D.   On: 04/10/2020 11:23        Scheduled Meds: . vitamin C  500 mg Oral Daily  . aspirin EC  81 mg Oral Daily  . atorvastatin  80 mg Oral q1800  . baricitinib  4 mg Oral Daily  . carvedilol  3.125 mg Oral BID WC  . Chlorhexidine Gluconate Cloth  6 each Topical Daily  . enoxaparin (LOVENOX) injection  0.5 mg/kg (Adjusted) Subcutaneous Q12H  . feeding supplement (ENSURE ENLIVE)  237 mL Oral TID BM  . insulin aspart  0-15 Units Subcutaneous TID WC  . insulin aspart  0-5 Units  Subcutaneous QHS  . insulin aspart  12 Units Subcutaneous TID WC  . insulin detemir  18 Units Subcutaneous BID  . Ipratropium-Albuterol  1 puff Inhalation Q6H  . methylPREDNISolone (SOLU-MEDROL) injection  0.5  mg/kg Intravenous Q12H  . pantoprazole  40 mg Oral QAC breakfast  . zinc sulfate  220 mg Oral Daily    LOS: 6 days    Time spent: 35 minutes    Aryaman Haliburton Darleen Crocker, DO [facility]  If 7PM-7AM, please contact night-coverage [email or web address] 04/12/2020, 8:57 AM

## 2020-04-12 NOTE — Progress Notes (Signed)
   04/12/20 1720  Family/Significant Other Communication  Family/Significant Other Update Called;Updated (Pt's Wife)

## 2020-04-13 ENCOUNTER — Inpatient Hospital Stay (HOSPITAL_COMMUNITY): Payer: Medicare HMO

## 2020-04-13 DIAGNOSIS — J9601 Acute respiratory failure with hypoxia: Secondary | ICD-10-CM

## 2020-04-13 LAB — COMPREHENSIVE METABOLIC PANEL
ALT: 68 U/L — ABNORMAL HIGH (ref 0–44)
AST: 57 U/L — ABNORMAL HIGH (ref 15–41)
Albumin: 2.4 g/dL — ABNORMAL LOW (ref 3.5–5.0)
Alkaline Phosphatase: 75 U/L (ref 38–126)
Anion gap: 11 (ref 5–15)
BUN: 39 mg/dL — ABNORMAL HIGH (ref 8–23)
CO2: 21 mmol/L — ABNORMAL LOW (ref 22–32)
Calcium: 8.6 mg/dL — ABNORMAL LOW (ref 8.9–10.3)
Chloride: 103 mmol/L (ref 98–111)
Creatinine, Ser: 1.1 mg/dL (ref 0.61–1.24)
GFR calc Af Amer: >60 mL/min (ref >60)
GFR calc non Af Amer: >60 mL/min (ref >60)
Glucose, Bld: 245 mg/dL — ABNORMAL HIGH (ref 70–99)
Potassium: 4.5 mmol/L (ref 3.5–5.1)
Sodium: 135 mmol/L (ref 135–145)
Total Bilirubin: 2 mg/dL — ABNORMAL HIGH (ref 0.3–1.2)
Total Protein: 6.2 g/dL — ABNORMAL LOW (ref 6.5–8.1)

## 2020-04-13 LAB — GLUCOSE, CAPILLARY
Glucose-Capillary: 175 mg/dL — ABNORMAL HIGH (ref 70–99)
Glucose-Capillary: 189 mg/dL — ABNORMAL HIGH (ref 70–99)
Glucose-Capillary: 199 mg/dL — ABNORMAL HIGH (ref 70–99)
Glucose-Capillary: 231 mg/dL — ABNORMAL HIGH (ref 70–99)
Glucose-Capillary: 257 mg/dL — ABNORMAL HIGH (ref 70–99)
Glucose-Capillary: 277 mg/dL — ABNORMAL HIGH (ref 70–99)
Glucose-Capillary: 331 mg/dL — ABNORMAL HIGH (ref 70–99)

## 2020-04-13 LAB — CBC
HCT: 36.7 % — ABNORMAL LOW (ref 39.0–52.0)
Hemoglobin: 11.7 g/dL — ABNORMAL LOW (ref 13.0–17.0)
MCH: 27.7 pg (ref 26.0–34.0)
MCHC: 31.9 g/dL (ref 30.0–36.0)
MCV: 86.8 fL (ref 80.0–100.0)
Platelets: 108 10*3/uL — ABNORMAL LOW (ref 150–400)
RBC: 4.23 MIL/uL (ref 4.22–5.81)
RDW: 16.4 % — ABNORMAL HIGH (ref 11.5–15.5)
WBC: 9.9 10*3/uL (ref 4.0–10.5)
nRBC: 0 % (ref 0.0–0.2)

## 2020-04-13 LAB — C-REACTIVE PROTEIN: CRP: 6.6 mg/dL — ABNORMAL HIGH (ref <1.0)

## 2020-04-13 LAB — D-DIMER, QUANTITATIVE: D-Dimer, Quant: 1.15 ug/mL-FEU — ABNORMAL HIGH (ref 0.00–0.50)

## 2020-04-13 LAB — MAGNESIUM: Magnesium: 2.2 mg/dL (ref 1.7–2.4)

## 2020-04-13 LAB — BRAIN NATRIURETIC PEPTIDE: B Natriuretic Peptide: 151.4 pg/mL — ABNORMAL HIGH (ref 0.0–100.0)

## 2020-04-13 LAB — FERRITIN: Ferritin: 915 ng/mL — ABNORMAL HIGH (ref 24–336)

## 2020-04-13 LAB — PROCALCITONIN: Procalcitonin: <0.1 ng/mL

## 2020-04-13 MED ORDER — PHENOL 1.4 % MT LIQD
1.0000 | OROMUCOSAL | Status: DC | PRN
  Administered 2020-04-13: 1 via OROMUCOSAL
  Filled 2020-04-13: qty 177

## 2020-04-13 MED ORDER — MIDAZOLAM HCL 2 MG/2ML IJ SOLN
INTRAMUSCULAR | Status: AC
  Filled 2020-04-13: qty 2

## 2020-04-13 MED ORDER — INSULIN ASPART 100 UNIT/ML ~~LOC~~ SOLN
0.0000 [IU] | SUBCUTANEOUS | Status: DC
  Administered 2020-04-13: 4 [IU] via SUBCUTANEOUS
  Administered 2020-04-13: 7 [IU] via SUBCUTANEOUS
  Administered 2020-04-13: 4 [IU] via SUBCUTANEOUS
  Administered 2020-04-14: 3 [IU] via SUBCUTANEOUS
  Administered 2020-04-14: 7 [IU] via SUBCUTANEOUS
  Administered 2020-04-14: 4 [IU] via SUBCUTANEOUS
  Administered 2020-04-14: 11 [IU] via SUBCUTANEOUS
  Administered 2020-04-14: 4 [IU] via SUBCUTANEOUS
  Administered 2020-04-15: 7 [IU] via SUBCUTANEOUS
  Administered 2020-04-15: 3 [IU] via SUBCUTANEOUS
  Administered 2020-04-15: 7 [IU] via SUBCUTANEOUS
  Administered 2020-04-15: 15 [IU] via SUBCUTANEOUS
  Administered 2020-04-16: 7 [IU] via SUBCUTANEOUS
  Administered 2020-04-16 (×2): 11 [IU] via SUBCUTANEOUS
  Administered 2020-04-17 (×2): 4 [IU] via SUBCUTANEOUS
  Administered 2020-04-17: 11 [IU] via SUBCUTANEOUS
  Administered 2020-04-17: 7 [IU] via SUBCUTANEOUS
  Administered 2020-04-17: 3 [IU] via SUBCUTANEOUS
  Administered 2020-04-17: 20 [IU] via SUBCUTANEOUS
  Administered 2020-04-18: 7 [IU] via SUBCUTANEOUS
  Administered 2020-04-18 (×3): 4 [IU] via SUBCUTANEOUS
  Administered 2020-04-18 – 2020-04-19 (×3): 11 [IU] via SUBCUTANEOUS
  Administered 2020-04-19: 3 [IU] via SUBCUTANEOUS
  Administered 2020-04-19: 11 [IU] via SUBCUTANEOUS
  Administered 2020-04-19: 3 [IU] via SUBCUTANEOUS
  Administered 2020-04-20: 7 [IU] via SUBCUTANEOUS
  Administered 2020-04-20 (×2): 3 [IU] via SUBCUTANEOUS
  Administered 2020-04-20: 11 [IU] via SUBCUTANEOUS
  Administered 2020-04-20: 4 [IU] via SUBCUTANEOUS
  Administered 2020-04-21 (×2): 7 [IU] via SUBCUTANEOUS
  Administered 2020-04-21: 4 [IU] via SUBCUTANEOUS
  Administered 2020-04-22: 7 [IU] via SUBCUTANEOUS
  Administered 2020-04-22: 3 [IU] via SUBCUTANEOUS
  Administered 2020-04-22: 4 [IU] via SUBCUTANEOUS
  Administered 2020-04-24 (×4): 7 [IU] via SUBCUTANEOUS
  Administered 2020-04-25: 3 [IU] via SUBCUTANEOUS
  Administered 2020-04-25: 4 [IU] via SUBCUTANEOUS
  Administered 2020-04-25: 7 [IU] via SUBCUTANEOUS
  Administered 2020-04-25: 3 [IU] via SUBCUTANEOUS
  Administered 2020-04-25: 4 [IU] via SUBCUTANEOUS
  Administered 2020-04-25: 3 [IU] via SUBCUTANEOUS
  Administered 2020-04-26: 11 [IU] via SUBCUTANEOUS
  Administered 2020-04-26: 15 [IU] via SUBCUTANEOUS
  Administered 2020-04-26: 7 [IU] via SUBCUTANEOUS

## 2020-04-13 MED ORDER — CHLORHEXIDINE GLUCONATE CLOTH 2 % EX PADS
6.0000 | MEDICATED_PAD | Freq: Every day | CUTANEOUS | Status: DC
  Administered 2020-04-13 – 2020-05-01 (×17): 6 via TOPICAL

## 2020-04-13 MED ORDER — INSULIN DETEMIR 100 UNIT/ML ~~LOC~~ SOLN
25.0000 [IU] | Freq: Two times a day (BID) | SUBCUTANEOUS | Status: DC
  Administered 2020-04-13 – 2020-04-22 (×19): 25 [IU] via SUBCUTANEOUS
  Filled 2020-04-13 (×24): qty 0.25

## 2020-04-13 MED ORDER — FENTANYL CITRATE (PF) 100 MCG/2ML IJ SOLN
INTRAMUSCULAR | Status: AC
  Filled 2020-04-13: qty 2

## 2020-04-13 MED ORDER — FUROSEMIDE 10 MG/ML IJ SOLN
40.0000 mg | Freq: Once | INTRAMUSCULAR | Status: AC
  Administered 2020-04-13: 40 mg via INTRAVENOUS
  Filled 2020-04-13: qty 4

## 2020-04-13 MED ORDER — ENOXAPARIN SODIUM 60 MG/0.6ML ~~LOC~~ SOLN
0.5000 mg/kg | SUBCUTANEOUS | Status: DC
  Administered 2020-04-14 – 2020-04-15 (×2): 47.5 mg via SUBCUTANEOUS
  Filled 2020-04-13 (×2): qty 0.6

## 2020-04-13 MED ORDER — CHLORHEXIDINE GLUCONATE 0.12 % MT SOLN
15.0000 mL | Freq: Two times a day (BID) | OROMUCOSAL | Status: DC
  Administered 2020-04-13 – 2020-04-23 (×18): 15 mL via OROMUCOSAL
  Filled 2020-04-13 (×13): qty 15

## 2020-04-13 MED ORDER — METHYLPREDNISOLONE SODIUM SUCC 125 MG IJ SOLR
60.0000 mg | Freq: Two times a day (BID) | INTRAMUSCULAR | Status: DC
  Administered 2020-04-13 (×2): 60 mg via INTRAVENOUS
  Filled 2020-04-13 (×2): qty 2

## 2020-04-13 MED ORDER — GLUCERNA SHAKE PO LIQD
237.0000 mL | Freq: Three times a day (TID) | ORAL | Status: DC
  Administered 2020-04-13 – 2020-04-22 (×23): 237 mL via ORAL
  Filled 2020-04-13 (×7): qty 237

## 2020-04-13 MED ORDER — ORAL CARE MOUTH RINSE
15.0000 mL | Freq: Two times a day (BID) | OROMUCOSAL | Status: DC
  Administered 2020-04-14 – 2020-04-23 (×20): 15 mL via OROMUCOSAL

## 2020-04-13 NOTE — Consult Note (Addendum)
NAME:  Alexander Parks, MRN:  [id number], DOB:  Aug 19, 1950, LOS: 7 ADMISSION DATE:  03/20/2020, CONSULTATION DATE:  04/13/20 REFERRING MD:  Lala Lund, MD CHIEF COMPLAINT:  AHRF 2/2 COVID  Brief History   69 year old male admitted for acute hypoxemic respiratory failure secondary to COVID. Admitted 8/23. Transferred to ICU 8/30 for increased O2 needs.  History of present illness   Alexander Parks is a 69 year old male who presented with shortness of breath, cough and positive covid test found hypoxemic. He was admitted [place] for acute hypoxemic respiratory failure secondary to COVID-19. Transferred to [facility]. He was treated with remdesivir, solumedrol and barcitinib. He has completed remdesivir. He has been diuresed but no lasix was given in the last 72 hours. He developed worsening oxygen requirement with flow increased from 40L to 60L and then transferred to the ICU on 8/30.  Past Medical History  CAD s/p CABG Cirrhosis Chronic diastolic heart failure Type II Diabetes mellitus   Significant Hospital Events   8/23 Admitted to [facility] 8/29 Transferred to [facility] 8/30 Transferred to ICU for increased O2 requirement  Consults:  PCCM  Procedures:    Significant Diagnostic Tests:  Covid PCR 8/23 Positive  Micro Data:  [place] 8/23 Neg  Antimicrobials:  Remdesivir 8/23-8/27 Baricitinib 8/24> Interim history/subjective:  As above  Objective   Blood pressure 125/69, pulse 93, temperature 97.9 F (36.6 C), temperature source Axillary, resp. rate (!) 23, height 6\' 2"  (1.88 m), weight 95.9 kg, SpO2 (!) 87 %.    FiO2 (%):  [100 %] 100 %   Intake/Output Summary (Last 24 hours) at 04/13/2020 1301 Last data filed at 04/13/2020 1153 Gross per 24 hour  Intake 180 ml  Output 1650 ml  Net -1470 ml   Filed Weights   04/07/20 1738 04/09/20 0400 04/10/20 0500  Weight: 96.6 kg 96.6 kg 95.9 kg    Physical Exam: General: Adult male in bed, no acute distress HENT: [place], AT, OP clear, MMM,  NRB Eyes: EOMI, no scleral icterus Respiratory: Diminished breath sounds bilaterally.  Bibasilar crackles Cardiovascular: RRR, -M/R/G, no JVD GI: BS+, soft, nontender Extremities:-Edema,-tenderness Neuro: AAO x4, CNII-XII grossly intact Skin: Intact, no rashes or bruising Psych: Normal mood, normal affect GU: Condom cath in place  CXR 04/13/20 Unchanged peripheral and bibasilar infiltrates compared to prior  Resolved Hospital Problem list     Assessment & Plan:   Acute hypoxemic respiratory failure secondary to COVID-19: High risk for intubation Unvaccinated 8/30: Worsening hypoxemia. Transferred to ICU. Low suspicion for co-comitant bacterial infection Procal neg. Will continue supportive care as noted below Plan: Heated high flow and nonrebreather  NIV as needed Target O2 saturations for goal SpO2 >85% at rest and ideally 75% with movement Monitor closely for signs of ventilatory failure such as nasal flaring, accessory muscle use, abdominal paradoxical movements for decision to intubate Self-proning while in bed Continue Remdesivir Continue Baricitinib Continue Solu-Medrol Pulmonary hygiene including bronchodilator and flutter valve Mobilize as tolerated Diurese  CAD s/p CABG Chronic diastolic heart failure Continue home BB and statin PRN diuretics  Type II DM Levemir SSI  Cirrhosis, cryptogenic No inpatient management  Best practice:  Diet: NPO Pain/Anxiety/Delirium protocol (if indicated): -- VAP protocol (if indicated): -- DVT prophylaxis: Lovenox GI prophylaxis: PPI Glucose control: CBG q4h Mobility: As tolerated Code Status: Full Family Communication: Updated patient at bedside 8/30 Disposition: ICU  Labs   CBC: Recent Labs  Lab 04/07/20 0346 04/07/20 0346 04/08/20 0501 04/08/20 0501 04/09/20 1443  04/10/20 0443 04/11/20 0446 04/12/20 0632 04/13/20 0220  WBC 3.5*   < > 8.5   < > 10.8* 8.4 11.2* 12.2* 9.9  NEUTROABS 2.4  --  6.6  --  8.2*  6.7 9.2*  --   --   HGB 11.8*   < > 11.7*   < > 11.9* 12.5* 12.4* 12.6* 11.7*  HCT 37.0*   < > 35.9*   < > 36.3* 37.9* 39.1 38.7* 36.7*  MCV 86.4   < > 86.7   < > 87.1 84.8 87.5 86.8 86.8  PLT 76*   < > 94*   < > 106* 100* 123* 127* 108*   < > = values in this interval not displayed.    Basic Metabolic Panel: Recent Labs  Lab 04/07/20 0346 04/07/20 0346 04/08/20 0501 04/08/20 0501 04/09/20 0438 04/10/20 0443 04/11/20 0446 04/12/20 0632 04/13/20 0220 04/13/20 0800  NA 132*   < > 138   < > 137 139 139 136 135  --   K 4.1   < > 4.0   < > 3.9 4.2 3.8 4.2 4.5  --   CL 104   < > 108   < > 106 109 108 106 103  --   CO2 19*   < > 21*   < > 21* 20* 22 21* 21*  --   GLUCOSE 356*   < > 244*   < > 156* 138* 85 188* 245*  --   BUN 40*   < > 41*   < > 44* 34* 35* 40* 39*  --   CREATININE 1.25*   < > 1.11   < > 1.21 0.91 0.91 0.99 1.10  --   CALCIUM 8.3*   < > 8.4*   < > 8.3* 8.4* 8.4* 8.2* 8.6*  --   MG 2.0   < > 1.9  --  1.9 2.0 2.0  --   --  2.2  PHOS 3.1  --  3.5  --  2.6 3.2 3.4  --   --   --    < > = values in this interval not displayed.   GFR: Estimated Creatinine Clearance: 73.7 mL/min (by C-G formula based on SCr of 1.1 mg/dL). Recent Labs  Lab 04/14/2020 1414 04/07/20 0346 04/10/20 0443 04/10/20 0443 04/11/20 0446 04/11/20 1031 04/12/20 0632 04/13/20 0220  PROCALCITON  --   --  <0.10   < > <0.10 <0.10 <0.10 <0.10  WBC  --    < > 8.4  --  11.2*  --  12.2* 9.9  LATICACIDVEN 1.7  --   --   --   --   --   --   --    < > = values in this interval not displayed.    Liver Function Tests: Recent Labs  Lab 04/09/20 0438 04/10/20 0443 04/11/20 0446 04/12/20 0632 04/13/20 0220  AST 78* 64* 60* 65* 57*  ALT 91* 81* 74* 78* 68*  ALKPHOS 63 65 65 74 75  BILITOT 1.0 1.2 1.6* 1.9* 2.0*  PROT 6.6 6.6 6.6 6.8 6.2*  ALBUMIN 2.8* 2.8* 2.8* 2.7* 2.4*   No results for input(s): LIPASE, AMYLASE in the last 168 hours. No results for input(s): AMMONIA in the last 168  hours.  ABG    Component Value Date/Time   PHART 7.387 02/09/2018 0558   PCO2ART 38.4 02/09/2018 0558   PO2ART 90.0 02/09/2018 0558   HCO3 23.0 02/09/2018 0558   TCO2 21 (L) 02/09/2018  1620   ACIDBASEDEF 2.0 02/09/2018 0558   O2SAT 67.4 02/10/2018 0335     Coagulation Profile: No results for input(s): INR, PROTIME in the last 168 hours.  Cardiac Enzymes: No results for input(s): CKTOTAL, CKMB, CKMBINDEX, TROPONINI in the last 168 hours.  HbA1C: Hemoglobin A1C  Date/Time Value Ref Range Status  04/29/2014 03:24 PM 8.8 (H) 4.2 - 6.3 % Final    Comment:    The American Diabetes Association recommends that a primary goal of therapy should be <7% and that physicians should reevaluate the treatment regimen in patients with HbA1c values consistently >8%.    Hgb A1c MFr Bld  Date/Time Value Ref Range Status  03/29/2020 11:59 AM 7.9 (H) 4.8 - 5.6 % Final    Comment:    (NOTE)         Prediabetes: 5.7 - 6.4         Diabetes: >6.4         Glycemic control for adults with diabetes: <7.0   02/10/2020 09:32 AM 7.3 (H) 4.8 - 5.6 % Final    Comment:    (NOTE) Pre diabetes:          5.7%-6.4%  Diabetes:              >6.4%  Glycemic control for   <7.0% adults with diabetes     CBG: Recent Labs  Lab 04/12/20 1627 04/12/20 2111 04/13/20 0305 04/13/20 0749 04/13/20 1158  GLUCAP 200* 109* 257* 199* 331*    Review of Systems:   Reports shortness of breath, no chest pain  Past Medical History  He,  has a past medical history of Allergy, Cirrhosis ([place]), Coronary artery disease, Diabetes mellitus without complication ([place]), and History of kidney stones.   Surgical History    Past Surgical History:  Procedure Laterality Date  . BALLOON DILATION  08/21/2012   Procedure: BALLOON DILATION;  Surgeon: Marissa Nestle, MD;  Location: [facility];  Service: Urology;  Laterality: Right;  Balloon Dilation Right Ureter  . BIOPSY  01/17/2020   Procedure: BIOPSY;  Surgeon: Rogene Houston, MD;  Location: [facility];  Service: Endoscopy;;  antral esophageal  . CHOLECYSTECTOMY N/A 02/12/2020   Procedure: LAPAROSCOPIC CHOLECYSTECTOMY;  Surgeon: Virl Cagey, MD;  Location: [facility];  Service: General;  Laterality: N/A;  . COLONOSCOPY WITH PROPOFOL N/A 01/17/2020   Procedure: COLONOSCOPY WITH PROPOFOL;  Surgeon: Rogene Houston, MD;  [facility]: [facility];  Service: Endoscopy;  Laterality: N/A;  730  . CORONARY ARTERY BYPASS GRAFT N/A 02/08/2018   Procedure: CORONARY ARTERY BYPASS GRAFT times five, LIMA-LAD, SVG-DIAG, SVG-OM, SVG-PD-PL(SEQ), using left internal mammary artery and Endoharvest of Right greater saphenous vein;  Surgeon: Grace Isaac, MD;  Location: [facility];  Service: Open Heart Surgery;  Laterality: N/A;  . CYSTOSCOPY W/ URETERAL STENT PLACEMENT  08/21/2012   Procedure: CYSTOSCOPY WITH RETROGRADE PYELOGRAM/URETERAL STENT PLACEMENT;  Surgeon: Marissa Nestle, MD;  Location: [facility];  Service: Urology;  Laterality: Right;  Cystoscopy with Right Retrograde Pyelogram, Right Ureteral Stent Placement  . ESOPHAGOGASTRODUODENOSCOPY (EGD) WITH PROPOFOL N/A 01/17/2020   Procedure: ESOPHAGOGASTRODUODENOSCOPY (EGD) WITH PROPOFOL;  Surgeon: Rogene Houston, MD;  [facility];  Service: Endoscopy;  Laterality: N/A;  . HERNIA REPAIR    . HOLMIUM LASER APPLICATION  10/20/8586   Procedure: HOLMIUM LASER APPLICATION;  Surgeon: Marissa Nestle, MD;  Location: [facility];  Service: Urology;  Laterality: Right;  Holmium Laser Application to Right Ureteral  Calculus  . INTRAOPERATIVE TRANSESOPHAGEAL ECHOCARDIOGRAM N/A 02/08/2018   Procedure: INTRAOPERATIVE TRANSESOPHAGEAL ECHOCARDIOGRAM;  Surgeon: Grace Isaac, MD;  [facility];  Service: Open Heart Surgery;  Laterality: N/A;  . LIVER BIOPSY N/A 02/12/2020   Procedure: LIVER BIOPSY;  Surgeon: Virl Cagey, MD;  Location: [facility];  Service: General;  Laterality: N/A;  . POLYPECTOMY  01/17/2020   Procedure:  POLYPECTOMY;  Surgeon: Rogene Houston, MD;  [facility];  Service: Endoscopy;;  colon  . RIGHT/LEFT HEART CATH AND CORONARY ANGIOGRAPHY N/A 02/07/2018   Procedure: RIGHT/LEFT HEART CATH AND CORONARY ANGIOGRAPHY;  Surgeon: Burnell Blanks, MD;  Location: [place] [facility];  Service: Cardiovascular;  Laterality: N/A;  . STONE EXTRACTION WITH BASKET  08/21/2012   Procedure: STONE EXTRACTION WITH BASKET;  Surgeon: Marissa Nestle, MD;  Location: [facility];  Service: Urology;  Laterality: Right;  Right Ureteral Stone Pension scheme manager  . UMBILICAL HERNIA REPAIR N/A 02/12/2020   Procedure: HERNIA REPAIR UMBILICAL ADULT;  Surgeon: Virl Cagey, MD;  Location: [facility];  Service: General;  Laterality: N/A;     Social History   reports that he quit smoking about 23 years ago. His smoking use included cigarettes. He has a 30.00 pack-year smoking history. He has never used smokeless tobacco. He reports that he does not drink alcohol and does not use drugs.   Family History   His family history includes Cancer in his mother and sister; Liver disease in his brother.   Allergies Allergies  Allergen Reactions  . Benadryl [Diphenhydramine Hcl] Swelling  . Iodinated Diagnostic Agents Other (See Comments)    Unknown  . Morphine And Related Other (See Comments)    Unknown     Home Medications  Prior to Admission medications   Medication Sig Start Date End Date Taking? Authorizing Provider  aspirin EC 81 MG tablet Take 1 tablet (81 mg total) by mouth daily. 01/20/20  Yes Rehman, Mechele Dawley, MD  atorvastatin (LIPITOR) 80 MG tablet Take 1 tablet (80 mg total) by mouth daily at 6 PM. 02/13/18  Yes Tacy Dura, Donielle M, PA-C  carvedilol (COREG) 3.125 MG tablet TAKE 1 TABLET BY MOUTH TWICE DAILY WITH A MEAL Patient taking differently: Take 3.125 mg by mouth 2 (two) times daily with a meal.  12/20/19  Yes Branch, Alphonse Guild, MD  furosemide (LASIX) 40 MG tablet Take 1 tablet (40 mg total) by  mouth daily. 02/13/18  Yes Lars Pinks M, PA-C  metFORMIN (GLUCOPHAGE-XR) 500 MG 24 hr tablet Take 1,000 mg by mouth 2 (two) times daily.    Yes [provider]  triamcinolone cream (KENALOG) 0.1 % Apply 1 application topically in the morning, at noon, and at bedtime.  12/05/19  Yes [provider]  docusate sodium (COLACE) 100 MG capsule Take 100 mg by mouth 2 (two) times daily as needed. 02/12/20   [provider]  lisinopril (ZESTRIL) 2.5 MG tablet Take 1 tablet by mouth once daily 04/09/20   Arnoldo Lenis, MD  pantoprazole (PROTONIX) 40 MG tablet Take 1 tablet (40 mg total) by mouth daily before breakfast. 01/17/20   Rogene Houston, MD     Critical care time: 45 min   The patient is critically ill with multiple organ systems failure and requires high complexity decision making for assessment and support, frequent evaluation and titration of therapies, application of advanced monitoring technologies and extensive interpretation of multiple databases.  Independent Critical Care Time: 50 Minutes.  Rodman Pickle, M.D. [facility] Pulmonary/Critical Care Medicine 04/13/2020 1:33 PM   Please see Amion for pager number to reach on-call Pulmonary and Critical Care Team.

## 2020-04-13 NOTE — Progress Notes (Addendum)
PROGRESS NOTE                                                                                                                                                                                                             Patient Demographics:    Alexander Parks, is a 69 y.o. male, DOB - Feb 24, 1951, HUD:[id number]  Outpatient Primary MD for the patient is Sasser, Silvestre Moment, MD    LOS - 7  Admit date - 04/10/2020    Chief Complaint  Patient presents with  . covid       Brief Narrative - 69 y.o.malewith medical history significantfor coronary artery disease status post CABG x5, cirrhosis of the liver, CHF, diabetes mellitus and nephrolithiasis currently works as a Administrator and not been vaccinated for COVID-19 was admitted to the hospital for severe acute hypoxic respiratory failure due to COVID-19 pneumonia at [facility] and thereafter transferred to [facility] for further care on heated high flow 35 L.   Subjective:    Alexander Parks today has, No headache, No chest pain, No abdominal pain - No Nausea, No new weakness tingling or numbness, +ve SOB.   Assessment  & Plan :    1. Acute Hypoxic Resp. Failure due to Acute Covid 19 Viral Pneumonitis during the ongoing 2020 Covid 19 Pandemic - he is unvaccinated and unfortunately has incurred severe parenchymal lung injury, he has been started on high-dose IV steroids, remdesivir and Barisitinib  combination.  Diurese as needed.  Continue monitoring closely overall extremely tenuous.  Will transfer him to ICU as he is now maxed out on heated high flow plus nonrebreather. On HHFL 60L + NRB and  may require NIPPV/intubation soon, will consult PCCM.  Encouraged the patient to sit up in chair in the daytime use I-S and flutter valve for pulmonary toiletry and then prone in bed when at night.  Will advance activity and titrate down oxygen as possible.    SpO2: (!) 85 % O2 Flow Rate  (L/min): 50 L/min FiO2 (%): 100 %  Recent Labs  Lab 03/15/2020 1338 03/15/2020 1414 04/07/20 0346 04/08/20 0501 04/08/20 0501 04/09/20 0438 04/10/20 0443 04/11/20 0446 04/11/20 0447 04/11/20 1031 04/12/20 0632 04/13/20 0220 04/13/20 0806  WBC  --   --    < > 8.5   < > 10.8* 8.4 11.2*  --   --  12.2* 9.9  --   PLT  --   --    < > 94*   < > 106* 100* 123*  --   --  127* 108*  --   CRP  --   --    < > 3.5*  --  1.5* 0.9  --  1.0*  --   --  6.6*  --   BNP  --   --   --   --   --   --   --   --   --   --   --   --  151.4*  DDIMER  --   --    < > 0.93*  --  0.91* 1.13* 1.02*  --   --   --  1.15*  --   PROCALCITON  --   --   --   --   --   --  <0.10 <0.10  --  <0.10 <0.10 <0.10  --   AST  --   --    < > 81*   < > 78* 64* 60*  --   --  65* 57*  --   ALT  --   --    < > 87*   < > 91* 81* 74*  --   --  78* 68*  --   ALKPHOS  --   --    < > 58   < > 63 65 65  --   --  74 75  --   BILITOT  --   --    < > 1.1   < > 1.0 1.2 1.6*  --   --  1.9* 2.0*  --   ALBUMIN  --   --    < > 2.7*   < > 2.8* 2.8* 2.8*  --   --  2.7* 2.4*  --   LATICACIDVEN  --  1.7  --   --   --   --   --   --   --   --   --   --   --   SARSCOV2NAA POSITIVE*  --   --   --   --   --   --   --   --   --   --   --   --    < > = values in this interval not displayed.    2. CAD post CABG -no acute issues chest pain-free on aspirin, Coreg and statin for secondary prevention.  3.  Chronic diastolic CHF EF 78%.  As needed Lasix.  4.  Dyslipidemia.  On statin.  5.  GERD.  On PPI.  6.  Cryptogenic cirrhosis recently diagnosed through biopsy .  Follow with Dr. Laural Golden in [place].    7. DM type II.  Currently on steroids.  Have adjusted his Levemir and sliding scale dose will monitor and adjust as needed.    Lab Results  Component Value Date   HGBA1C 7.9 (H) 04/11/2020   CBG (last 3)  Recent Labs    04/13/20 0305 04/13/20 0749 04/13/20 1158  GLUCAP 257* 199* 331*      Condition - Extremely Guarded  Family  Communication  :  Wife [phone] on 04/13/20   Code Status :  Full  Consults  : PCCM  Procedures  :  None  PUD Prophylaxis : PPI  Disposition Plan  :    Status is: Inpatient  Remains inpatient appropriate because:IV treatments  appropriate due to intensity of illness or inability to take PO   Dispo:  Patient From: Home  Planned Disposition: To be determined  Expected discharge date: 04/11/20  Medically stable for discharge: No   DVT Prophylaxis  :  Lovenox   Lab Results  Component Value Date   PLT 108 (L) 04/13/2020    Diet :  Diet Order            Diet heart healthy/carb modified Room service appropriate? Yes; Fluid consistency: Thin  Diet effective now                  Inpatient Medications  Scheduled Meds: . vitamin C  500 mg Oral Daily  . aspirin EC  81 mg Oral Daily  . atorvastatin  80 mg Oral q1800  . baricitinib  4 mg Oral Daily  . carvedilol  3.125 mg Oral BID WC  . enoxaparin (LOVENOX) injection  0.5 mg/kg (Adjusted) Subcutaneous Q12H  . feeding supplement (GLUCERNA SHAKE)  237 mL Oral TID BM  . insulin aspart  0-15 Units Subcutaneous TID WC  . insulin aspart  0-5 Units Subcutaneous QHS  . insulin aspart  12 Units Subcutaneous TID WC  . insulin detemir  25 Units Subcutaneous BID  . Ipratropium-Albuterol  1 puff Inhalation Q6H  . methylPREDNISolone (SOLU-MEDROL) injection  60 mg Intravenous Q12H  . pantoprazole  40 mg Oral QAC breakfast  . zinc sulfate  220 mg Oral Daily   Continuous Infusions: PRN Meds:.acetaminophen, bisacodyl, chlorpheniramine-HYDROcodone, docusate sodium, guaiFENesin-dextromethorphan, [DISCONTINUED] ondansetron **OR** ondansetron (ZOFRAN) IV, traZODone  Antibiotics  :    Anti-infectives (From admission, onward)   Start     Dose/Rate Route Frequency Ordered Stop   04/10/20 1315  remdesivir 100 mg in sodium chloride 0.9 % 100 mL IVPB        100 mg 200 mL/hr over 30 Minutes Intravenous  Once 04/10/20 1301 04/10/20 1355    04/07/20 1000  remdesivir 100 mg in sodium chloride 0.9 % 100 mL IVPB  Status:  Discontinued       "Followed by" Linked Group Details   100 mg 200 mL/hr over 30 Minutes Intravenous Daily 04/12/2020 1348 04/10/20 1301   04/09/2020 1400  remdesivir 100 mg in sodium chloride 0.9 % 100 mL IVPB       "Followed by" Linked Group Details   100 mg 200 mL/hr over 30 Minutes Intravenous Every 1 hr x 2 03/22/2020 1348 03/27/2020 1559       Time Spent in minutes  30   Lala Lund M.D on 04/13/2020 at 12:03 PM  To page go to [email or web address] - password [facility]  [facility] -  Office  [phone]    See all Orders from today for further details    Objective:   Vitals:   04/13/20 0204 04/13/20 0400 04/13/20 0747 04/13/20 1052  BP: (!) 119/51 140/86 (!) 121/59   Pulse: 80 93 80 86  Resp: (!) 25 20 19 16   Temp:  97.6 F (36.4 C) 98.4 F (36.9 C)   TempSrc:  Axillary Axillary   SpO2: (!) 86% (!) 86% 91% (!) 85%  Weight:      Height:        Wt Readings from Last 3 Encounters:  04/10/20 95.9 kg  03/26/20 102.5 kg  02/21/20 103 kg     Intake/Output Summary (Last 24 hours) at 04/13/2020 1203 Last data filed at 04/13/2020 1153 Gross per 24 hour  Intake 180 ml  Output 1650  ml  Net -1470 ml     Physical Exam  Awake Alert, No new F.N deficits, Normal affect [place].AT,PERRAL Supple Neck,No JVD, No cervical lymphadenopathy appriciated.  Symmetrical Chest wall movement, Good air movement bilaterally, +ve rales RRR,No Gallops,Rubs or new Murmurs, No Parasternal Heave +ve B.Sounds, Abd Soft, No tenderness, No organomegaly appriciated, No rebound - guarding or rigidity. No Cyanosis, Clubbing or edema, No new Rash or bruise      Data Review:    CBC Recent Labs  Lab 04/07/20 0346 04/07/20 0346 04/08/20 0501 04/08/20 0501 04/09/20 0438 04/10/20 0443 04/11/20 0446 04/12/20 0632 04/13/20 0220  WBC 3.5*   < > 8.5   < > 10.8* 8.4 11.2* 12.2* 9.9  HGB 11.8*   < > 11.7*   < > 11.9*  12.5* 12.4* 12.6* 11.7*  HCT 37.0*   < > 35.9*   < > 36.3* 37.9* 39.1 38.7* 36.7*  PLT 76*   < > 94*   < > 106* 100* 123* 127* 108*  MCV 86.4   < > 86.7   < > 87.1 84.8 87.5 86.8 86.8  MCH 27.6   < > 28.3   < > 28.5 28.0 27.7 28.3 27.7  MCHC 31.9   < > 32.6   < > 32.8 33.0 31.7 32.6 31.9  RDW 16.4*   < > 16.3*   < > 16.3* 16.3* 16.3* 16.7* 16.4*  LYMPHSABS 0.8  --  1.3  --  1.4 1.0 1.1  --   --   MONOABS 0.2  --  0.5  --  0.9 0.6 0.8  --   --   EOSABS 0.0  --  0.0  --  0.0 0.0 0.0  --   --   BASOSABS 0.0  --  0.0  --  0.0 0.0 0.0  --   --    < > = values in this interval not displayed.    Recent Labs  Lab 03/24/2020 1414 04/07/20 0346 04/08/20 0501 04/08/20 0501 04/09/20 0438 04/10/20 0443 04/11/20 0446 04/11/20 0447 04/11/20 1031 04/12/20 0632 04/13/20 0220 04/13/20 0800 04/13/20 0806  NA  --    < > 138   < > 137 139 139  --   --  136 135  --   --   K  --    < > 4.0   < > 3.9 4.2 3.8  --   --  4.2 4.5  --   --   CL  --    < > 108   < > 106 109 108  --   --  106 103  --   --   CO2  --    < > 21*   < > 21* 20* 22  --   --  21* 21*  --   --   GLUCOSE  --    < > 244*   < > 156* 138* 85  --   --  188* 245*  --   --   BUN  --    < > 41*   < > 44* 34* 35*  --   --  40* 39*  --   --   CREATININE  --    < > 1.11   < > 1.21 0.91 0.91  --   --  0.99 1.10  --   --   CALCIUM  --    < > 8.4*   < > 8.3* 8.4* 8.4*  --   --  8.2* 8.6*  --   --   AST  --    < > 81*   < > 78* 64* 60*  --   --  65* 57*  --   --   ALT  --    < > 87*   < > 91* 81* 74*  --   --  78* 68*  --   --   ALKPHOS  --    < > 58   < > 63 65 65  --   --  74 75  --   --   BILITOT  --    < > 1.1   < > 1.0 1.2 1.6*  --   --  1.9* 2.0*  --   --   ALBUMIN  --    < > 2.7*   < > 2.8* 2.8* 2.8*  --   --  2.7* 2.4*  --   --   MG  --    < > 1.9  --  1.9 2.0 2.0  --   --   --   --  2.2  --   CRP  --    < > 3.5*  --  1.5* 0.9  --  1.0*  --   --  6.6*  --   --   DDIMER  --    < > 0.93*  --  0.91* 1.13* 1.02*  --   --   --  1.15*  --    --   PROCALCITON  --   --   --   --   --  <0.10 <0.10  --  <0.10 <0.10 <0.10  --   --   LATICACIDVEN 1.7  --   --   --   --   --   --   --   --   --   --   --   --   BNP  --   --   --   --   --   --   --   --   --   --   --   --  151.4*   < > = values in this interval not displayed.    ------------------------------------------------------------------------------------------------------------------ No results for input(s): CHOL, HDL, LDLCALC, TRIG, CHOLHDL, LDLDIRECT in the last 72 hours.  Lab Results  Component Value Date   HGBA1C 7.9 (H) 04/05/2020   ------------------------------------------------------------------------------------------------------------------ No results for input(s): TSH, T4TOTAL, T3FREE, THYROIDAB in the last 72 hours.  Invalid input(s): FREET3  Cardiac Enzymes No results for input(s): CKMB, TROPONINI, MYOGLOBIN in the last 168 hours.  Invalid input(s): CK ------------------------------------------------------------------------------------------------------------------    Component Value Date/Time   BNP 151.4 (H) 04/13/2020 7672    Micro Results Recent Results (from the past 240 hour(s))  Blood Culture (routine x 2)     Status: None   Collection Time: 04/11/2020 11:50 AM   Specimen: BLOOD  Result Value Ref Range Status   Specimen Description BLOOD LEFT ANTECUBITAL  Final   Special Requests   Final    BOTTLES DRAWN AEROBIC AND ANAEROBIC Blood Culture results may not be optimal due to an inadequate volume of blood received in culture bottles   Culture   Final    NO GROWTH 5 DAYS Performed at [facility], [address] [place], [place] [address]    Report Status 04/11/2020 FINAL  Final  Blood Culture (routine x 2)     Status: None   Collection Time: 04/08/2020 11:59 AM  Specimen: BLOOD LEFT HAND  Result Value Ref Range Status   Specimen Description BLOOD LEFT HAND  Final   Special Requests   Final    BOTTLES DRAWN AEROBIC AND ANAEROBIC Blood  Culture results may not be optimal due to an inadequate volume of blood received in culture bottles   Culture   Final    NO GROWTH 5 DAYS Performed at [facility], [address], [place], [place] [address]    Report Status 04/11/2020 FINAL  Final  SARS Coronavirus 2 by RT PCR (hospital order, performed in [place] hospital lab) Nasopharyngeal Nasopharyngeal Swab     Status: Abnormal   Collection Time: 04/07/2020  1:38 PM   Specimen: Nasopharyngeal Swab  Result Value Ref Range Status   SARS Coronavirus 2 POSITIVE (A) NEGATIVE Final    Comment: RESULT CALLED TO, READ BACK BY AND VERIFIED WITH: JENNIFER KENDRICK,RN @1641  03/29/2020 KAY (NOTE) SARS-CoV-2 target nucleic acids are DETECTED  SARS-CoV-2 RNA is generally detectable in upper respiratory specimens  during the acute phase of infection.  Positive results are indicative  of the presence of the identified virus, but do not rule out bacterial infection or co-infection with other pathogens not detected by the test.  Clinical correlation with patient history and  other diagnostic information is necessary to determine patient infection status.  The expected result is negative.  Fact Sheet for Patients:   StrictlyIdeas.no   Fact Sheet for Healthcare Providers:   BankingDealers.co.za    This test is not yet approved or cleared by the [place] FDA and  has been authorized for detection and/or diagnosis of SARS-CoV-2 by FDA under an Emergency Use Authorization (EUA).  This EUA will remain in effect (meaning  this test can be used) for the duration of  the COVID-19 declaration under Section 564(b)(1) of the Act, 21 U.S.C. section 360-bbb-3(b)(1), unless the authorization is terminated or revoked sooner.  Performed at [facility], [address], [place], [place] [address]   MRSA PCR Screening     Status: None   Collection Time: 04/07/20  6:35 PM   Specimen: Nasal Mucosa;  Nasopharyngeal  Result Value Ref Range Status   MRSA by PCR NEGATIVE NEGATIVE Final    Comment:        The GeneXpert MRSA Assay (FDA approved for NASAL specimens only), is one component of a comprehensive MRSA colonization surveillance program. It is not intended to diagnose MRSA infection nor to guide or monitor treatment for MRSA infections. Performed at [facility], [address], [place], [name] [address]     Radiology Reports DG Chest [place] 1 View  Result Date: 04/13/2020 CLINICAL DATA:  Shortness of breath, COVID-19 positive EXAM: PORTABLE CHEST 1 VIEW COMPARISON:  04/10/2020 FINDINGS: Post CABG changes. Stable cardiomediastinal contours. Atherosclerotic calcification of the aortic knob. Patchy opacities within the periphery of the left mid to lower lung fields, slightly progressed from prior. Streaky right basilar interstitial opacity. No large pleural fluid collection. No pneumothorax. IMPRESSION: Slight progression of airspace opacities, most pronounced in the periphery of the left lung. Findings favored to represent atypical/viral infection in the setting of known COVID 19. Electronically Signed   By: Davina Poke D.O.   On: 04/13/2020 08:26   DG Chest Port 1 View  Result Date: 04/10/2020 CLINICAL DATA:  Chest pain, shortness of breath and low oxygen saturation, recent diagnosis of COVID-19, diabetes mellitus, former smoker, cirrhosis, coronary artery disease post CABG EXAM: PORTABLE CHEST 1 VIEW COMPARISON:  Portable exam 1057  hours compared to 03/29/2020 FINDINGS: Upper normal heart size post CABG. Tortuous aorta with minimal atherosclerotic calcification. Pulmonary vascularity normal. Interstitial infiltrates LEFT lung question atypical infection. No pleural effusion or pneumothorax. Osseous structures unremarkable. IMPRESSION: Infiltrates in mid to inferior LEFT lung question atypical infection. Electronically Signed   By: Lavonia Dana M.D.   On: 04/10/2020 11:23   DG  Chest Port 1 View  Result Date: 03/21/2020 CLINICAL DATA:  69 year old male positive COVID-19.  Cough. EXAM: PORTABLE CHEST 1 VIEW COMPARISON:  02/21/2020 chest radiographs and earlier. FINDINGS: AP view at 1137 hours. Increased lordotic positioning. Possibly lower lung volumes. Stable cardiac size and mediastinal contours. Prior CABG. Chronic increased interstitial markings in both lungs, with mild new patchy and vague opacity along the right minor fissure and possibly also at the left lung base. No superimposed pneumothorax or pleural effusion. Visualized tracheal air column is within normal limits. No acute osseous abnormality identified. Paucity of bowel gas in the upper abdomen. IMPRESSION: Chronic pulmonary interstitial changes suspected with evidence of mild superimposed COVID-19 pneumonia. Electronically Signed   By: Genevie Ann M.D.   On: 04/01/2020 11:51

## 2020-04-13 NOTE — Progress Notes (Signed)
PT Cancellation Note  Patient Details Name: Alexander Parks MRN: [id number] DOB: 03/22/1951   Cancelled Treatment:    Reason Eval/Treat Not Completed: Medical issues which prohibited therapy. Pt with decline in medical status and transferred to ICU.   [place] 04/13/2020, 1:47 PM [place] Pager [phone] Office [phone]

## 2020-04-13 NOTE — Progress Notes (Signed)
PCCM Progress Note  Updated wife on patient transfer to ICU. Currently patient is tolerating BiPAP and appears comfortable. If his status were to worsen and need mechanical ventilation, she would like to be called as soon as it occurs. She appreciated the update.  Rodman Pickle, M.D. [facility] Pulmonary/Critical Care Medicine 04/13/2020 3:55 PM

## 2020-04-13 NOTE — Progress Notes (Addendum)
Patient sitting in the chair; Sat O2 in low 80s on heated HFNC and NRB, labored respiration at rest; patient is not complaining of other distress. MD notified. New order to increase heated HFNC at 60 L and transfer patient to ICU.

## 2020-04-13 NOTE — Progress Notes (Signed)
Patient was transferred to 3M07 per MD order.

## 2020-04-13 NOTE — Progress Notes (Signed)
Received pt from 5W. Placed on [place] at [place] 100% + NRB. Pt able to speak in full sentences. Denies SOB, no increase WOB. MD at bedside. Will hold intubation for now. Pt to be monitored closely.

## 2020-04-14 DIAGNOSIS — U071 COVID-19: Principal | ICD-10-CM

## 2020-04-14 DIAGNOSIS — E1165 Type 2 diabetes mellitus with hyperglycemia: Secondary | ICD-10-CM | POA: Diagnosis not present

## 2020-04-14 DIAGNOSIS — I5021 Acute systolic (congestive) heart failure: Secondary | ICD-10-CM | POA: Diagnosis not present

## 2020-04-14 DIAGNOSIS — J1282 Pneumonia due to coronavirus disease 2019: Secondary | ICD-10-CM

## 2020-04-14 DIAGNOSIS — Z7984 Long term (current) use of oral hypoglycemic drugs: Secondary | ICD-10-CM | POA: Diagnosis not present

## 2020-04-14 LAB — COMPREHENSIVE METABOLIC PANEL
ALT: 71 U/L — ABNORMAL HIGH (ref 0–44)
AST: 49 U/L — ABNORMAL HIGH (ref 15–41)
Albumin: 2.6 g/dL — ABNORMAL LOW (ref 3.5–5.0)
Alkaline Phosphatase: 79 U/L (ref 38–126)
Anion gap: 12 (ref 5–15)
BUN: 45 mg/dL — ABNORMAL HIGH (ref 8–23)
CO2: 27 mmol/L (ref 22–32)
Calcium: 9.1 mg/dL (ref 8.9–10.3)
Chloride: 101 mmol/L (ref 98–111)
Creatinine, Ser: 1.29 mg/dL — ABNORMAL HIGH (ref 0.61–1.24)
GFR calc Af Amer: >60 mL/min (ref >60)
GFR calc non Af Amer: 56 mL/min — ABNORMAL LOW (ref >60)
Glucose, Bld: 109 mg/dL — ABNORMAL HIGH (ref 70–99)
Potassium: 3.8 mmol/L (ref 3.5–5.1)
Sodium: 140 mmol/L (ref 135–145)
Total Bilirubin: 2.3 mg/dL — ABNORMAL HIGH (ref 0.3–1.2)
Total Protein: 7.6 g/dL (ref 6.5–8.1)

## 2020-04-14 LAB — CBC
HCT: 40.8 % (ref 39.0–52.0)
Hemoglobin: 13.6 g/dL (ref 13.0–17.0)
MCH: 28 pg (ref 26.0–34.0)
MCHC: 33.3 g/dL (ref 30.0–36.0)
MCV: 84.1 fL (ref 80.0–100.0)
Platelets: 142 10*3/uL — ABNORMAL LOW (ref 150–400)
RBC: 4.85 MIL/uL (ref 4.22–5.81)
RDW: 16.5 % — ABNORMAL HIGH (ref 11.5–15.5)
WBC: 14 10*3/uL — ABNORMAL HIGH (ref 4.0–10.5)
nRBC: 0 % (ref 0.0–0.2)

## 2020-04-14 LAB — GLUCOSE, CAPILLARY
Glucose-Capillary: 144 mg/dL — ABNORMAL HIGH (ref 70–99)
Glucose-Capillary: 99 mg/dL (ref 70–99)
Glucose-Capillary: 252 mg/dL — ABNORMAL HIGH (ref 70–99)
Glucose-Capillary: 221 mg/dL — ABNORMAL HIGH (ref 70–99)
Glucose-Capillary: 165 mg/dL — ABNORMAL HIGH (ref 70–99)
Glucose-Capillary: 176 mg/dL — ABNORMAL HIGH (ref 70–99)

## 2020-04-14 LAB — D-DIMER, QUANTITATIVE: D-Dimer, Quant: 1.29 ug/mL-FEU — ABNORMAL HIGH (ref 0.00–0.50)

## 2020-04-14 LAB — BASIC METABOLIC PANEL
Anion gap: 13 (ref 5–15)
BUN: 44 mg/dL — ABNORMAL HIGH (ref 8–23)
CO2: 21 mmol/L — ABNORMAL LOW (ref 22–32)
Calcium: 9.1 mg/dL (ref 8.9–10.3)
Chloride: 103 mmol/L (ref 98–111)
Creatinine, Ser: 1.1 mg/dL (ref 0.61–1.24)
GFR calc Af Amer: >60 mL/min (ref >60)
GFR calc non Af Amer: >60 mL/min (ref >60)
Glucose, Bld: 180 mg/dL — ABNORMAL HIGH (ref 70–99)
Potassium: 4.2 mmol/L (ref 3.5–5.1)
Sodium: 137 mmol/L (ref 135–145)

## 2020-04-14 LAB — CREATININE, URINE, RANDOM: Creatinine, Urine: 145.77 mg/dL

## 2020-04-14 LAB — MAGNESIUM
Magnesium: 2.2 mg/dL (ref 1.7–2.4)
Magnesium: 2.1 mg/dL (ref 1.7–2.4)

## 2020-04-14 LAB — PROCALCITONIN: Procalcitonin: 0.11 ng/mL

## 2020-04-14 LAB — SODIUM, URINE, RANDOM: Sodium, Ur: 36 mmol/L

## 2020-04-14 LAB — BRAIN NATRIURETIC PEPTIDE: B Natriuretic Peptide: 108.8 pg/mL — ABNORMAL HIGH (ref 0.0–100.0)

## 2020-04-14 LAB — C-REACTIVE PROTEIN: CRP: 3.8 mg/dL — ABNORMAL HIGH (ref <1.0)

## 2020-04-14 MED ORDER — METHYLPREDNISOLONE SODIUM SUCC 40 MG IJ SOLR
40.0000 mg | Freq: Every day | INTRAMUSCULAR | Status: DC
  Filled 2020-04-14: qty 1

## 2020-04-14 MED ORDER — METHYLPREDNISOLONE SODIUM SUCC 40 MG IJ SOLR: 30.0000 mg | Freq: Every day | INTRAMUSCULAR | Status: DC

## 2020-04-14 MED ORDER — METHYLPREDNISOLONE SODIUM SUCC 40 MG IJ SOLR
40.0000 mg | Freq: Two times a day (BID) | INTRAMUSCULAR | Status: AC
  Administered 2020-04-14 – 2020-04-17 (×6): 40 mg via INTRAVENOUS
  Filled 2020-04-14 (×5): qty 1

## 2020-04-14 MED ORDER — METHYLPREDNISOLONE SODIUM SUCC 40 MG IJ SOLR
30.0000 mg | Freq: Two times a day (BID) | INTRAMUSCULAR | Status: DC
  Administered 2020-04-14: 30 mg via INTRAVENOUS
  Filled 2020-04-14: qty 1

## 2020-04-14 NOTE — Evaluation (Signed)
Occupational Therapy Evaluation Patient Details Name: Alexander Parks MRN: [id number] DOB: 06-08-1951 Today's Date: 04/14/2020    History of Present Illness [name] is a 69 year old male who presented with shortness of breath, cough and positive covid test found hypoxemic. He was admitted [place] for acute hypoxemic respiratory failure secondary to COVID-19. Transferred to [facility]. He was treated with remdesivir, solumedrol and barcitinib. He has completed remdesivir. He has been diuresed but no lasix was given in the last 72 hours. He developed worsening oxygen requirement with flow increased from 40L to 60L and then transferred to the ICU on 8/30.   Clinical Impression   PTA, pt was living with his wife and was independent; working as a Arts administrator. Pt currently requiring Min A for UB ADLs, Min-Mod A for LB ADLs, and Min A +2 for functional transfers. Pt presenting with decreased balance, strength, and activity tolerance. Requiring increased time, rest breaks, and cues for purse lip breathing. Highly motivated to participate in therapy despite fatigue. Pt would benefit from further acute OT to facilitate safe dc. Recommend dc to home with HHOT for further OT to optimize safety, independence with ADLs, and return to PLOF.   At rest, SpO2 88% on 45L heated HFNC with FiO2 at 100% +15L NRB. SpO2 dropping to 72% during transfer on 45L HFNC only. Able to recover to 88% on 45L only and seated rest break.  RR 20s at rest and elevating to 30-40s with activity. HR 110.     Follow Up Recommendations  Home health OT;Supervision/Assistance - 24 hour (Unsure of support as pt reports his wife does not have COVID)    Equipment Recommendations  3 in 1 bedside commode    Recommendations for Other Services PT consult     Precautions / Restrictions Precautions Precautions: Fall      Mobility Bed Mobility Overal bed mobility: Needs Assistance Bed Mobility: Supine to Sit;Sit to  Supine     Supine to sit: Min guard Sit to supine: Min guard   General bed mobility comments: Safety  Transfers Overall transfer level: Needs assistance Equipment used: 1 person hand held assist Transfers: Sit to/from [facility] Sit to Stand: Min assist Stand pivot transfers: Min assist;+2 safety/equipment       General transfer comment: stability assist during pivot, coaching pt to breathe efficiently as sats fell    Balance Overall balance assessment: Mild deficits observed, not formally tested                                         ADL either performed or assessed with clinical judgement   ADL Overall ADL's : Needs assistance/impaired Eating/Feeding: Set up;Sitting   Grooming: Wash/dry hands;Supervision/safety;Set up;Sitting   Upper Body Bathing: Minimal assistance;Sitting   Lower Body Bathing: Minimal assistance;Sit to/from stand   Upper Body Dressing : Minimal assistance;Sitting   Lower Body Dressing: Moderate assistance;Sit to/from stand   Toilet Transfer: Minimal assistance;+2 for safety/equipment;Stand-pivot;BSC   Toileting- Clothing Manipulation and Hygiene: Minimal assistance;+2 for safety/equipment;Sit to/from stand Toileting - Clothing Manipulation Details (indicate cue type and reason): Performing peri care after BM     Functional mobility during ADLs: Minimal assistance;+2 for safety/equipment (stand pivot) General ADL Comments: Pt demonstrating decreased activity tolerance as seen by requiring seated rest breaks, decreased SpO2 with movement, and cues for puse lip breathing     Vision Baseline  Vision/History: Wears glasses Wears Glasses: Reading only Patient Visual Report: No change from baseline       Perception     Praxis      Pertinent Vitals/Pain Pain Assessment: No/denies pain     Hand Dominance Right   Extremity/Trunk Assessment Upper Extremity Assessment Upper Extremity Assessment: Overall WFL  for tasks assessed   Lower Extremity Assessment Lower Extremity Assessment: Defer to PT evaluation   Cervical / Trunk Assessment Cervical / Trunk Assessment: Other exceptions Cervical / Trunk Exceptions: forwrd rounding of shoulder   Communication Communication Communication: No difficulties   Cognition Arousal/Alertness: Awake/alert Behavior During Therapy: WFL for tasks assessed/performed Overall Cognitive Status: Within Functional Limits for tasks assessed                                     General Comments  SpO2 88% on 45L heated HFNC with FiO2 at 100% and on 15L NRB supine in bed. Taking NRB off for transfer to [facility]. SpO2 dropping to 72% during stand pivot to [facility]. Requiring increased time and cues for purse lip breathing to recover back to 90% on 45L heated HFNC. Placing NRB mask back on for peri care and transfer back to bed; maintaining SpO2 >79%. RR 20s at rest and elevating to 30-40s with activity. HR 110.     Exercises Exercises: Other exercises Other Exercises Other Exercises: Purse lip breathing   Shoulder Instructions      Home Living Family/patient expects to be discharged to:: Private residence Living Arrangements: Spouse/significant other Available Help at Discharge: Family;Available 24 hours/day Type of Home: House Home Access: Stairs to enter [employer] of Steps: 6 Entrance Stairs-Rails: Right;Left;Can reach both Home Layout: One level     Bathroom Shower/Tub: Occupational psychologist: Standard     Home Equipment: Shower seat;Shower seat - built in;Grab bars - tub/shower;Hand held shower head          Prior Functioning/Environment Level of Independence: Independent        Comments: long haul truck driver        OT Problem List: Decreased strength;Decreased range of motion;Decreased activity tolerance;Impaired balance (sitting and/or standing);Decreased knowledge of use of DME or AE      OT  Treatment/Interventions: Self-care/ADL training;Therapeutic exercise;Energy conservation;DME and/or AE instruction;Therapeutic activities;Patient/family education    OT Goals(Current goals can be found in the care plan section) Acute Rehab OT Goals Patient Stated Goal: better and back home OT Goal Formulation: With patient Time For Goal Achievement: 04/28/20 Potential to Achieve Goals: Good  OT Frequency: Min 2X/week   Barriers to D/C:            Co-evaluation PT/OT/SLP Co-Evaluation/Treatment: Yes Reason for Co-Treatment: For patient/therapist safety;To address functional/ADL transfers   OT goals addressed during session: ADL's and self-care      AM-PAC OT "6 Clicks" Daily Activity     Outcome Measure Help from another person eating meals?: A Little Help from another person taking care of personal grooming?: A Little Help from another person toileting, which includes using toliet, bedpan, or urinal?: A Little Help from another person bathing (including washing, rinsing, drying)?: A Little Help from another person to put on and taking off regular upper body clothing?: A Little Help from another person to put on and taking off regular lower body clothing?: A Lot 6 Click Score: 17   End of Session Equipment Utilized During Treatment: Oxygen (  45L heated HFNC + 15L NRB) Nurse Communication: Mobility status  Activity Tolerance: Patient tolerated treatment well;Patient limited by fatigue Patient left: in bed;with call bell/phone within reach  OT Visit Diagnosis: Unsteadiness on feet (R26.81);Other abnormalities of gait and mobility (R26.89);Muscle weakness (generalized) (M62.81)                Time: 0156-1537 OT Time Calculation (min): 27 min Charges:  OT General Charges $OT Visit: 1 Visit OT Evaluation $OT Eval High Complexity: 1 High  Durinda Buzzelli MSOT, OTR/L Acute Rehab Pager: [phone] Office: [place] 04/14/2020, 5:57 PM

## 2020-04-14 NOTE — Progress Notes (Signed)
[place] Progress Note Patient Name: Alexander Parks DOB: 03-18-1951 MRN: [id number]   Date of Service  04/14/2020  HPI/Events of Note  Patient with prolonged QT on 12 lead EKG.  eICU Interventions  Pantoprazole and Trazodone discontinued, BMET, Mg+ and ionized calcium ordered.        Frederik Pear 04/14/2020, 9:47 PM

## 2020-04-14 NOTE — Progress Notes (Signed)
NAME:  Alexander Parks, MRN:  [id number], DOB:  1951/03/12, LOS: 8 ADMISSION DATE:  03/17/2020, CONSULTATION DATE:  04/13/20 REFERRING MD:  Lala Lund, MD CHIEF COMPLAINT:  AHRF 2/2 COVID  Brief History   69 year old male admitted for acute hypoxemic respiratory failure secondary to COVID. Admitted 8/23. Transferred to ICU 8/30 for increased O2 needs.  History of present illness   Mr. Jaqua Ching is a 69 year old male who presented with shortness of breath, cough and positive covid test found hypoxemic. He was admitted [place] for acute hypoxemic respiratory failure secondary to COVID-19. Transferred to [facility]. He was treated with remdesivir, solumedrol and barcitinib. He has completed remdesivir. He has been diuresed but no lasix was given in the last 72 hours. He developed worsening oxygen requirement with flow increased from 40L to 60L and then transferred to the ICU on 8/30.  Past Medical History  CAD s/p CABG Cirrhosis Chronic diastolic heart failure Type II Diabetes mellitus  Significant Hospital Events   8/23 Admitted to [facility] 8/29 Transferred to [facility] 8/30 Transferred to ICU for increased O2 requirement  Consults:  PCCM  Procedures:    Significant Diagnostic Tests:  8/23+ Covid PCR  Micro Data:  8/23 - blood culture  Antimicrobials:  Remdesivir 8/23-8/27 Baricitinib 8/24>  Interim history/subjective:  No acute event overnight  Patient is seen at bedside.  He is off the BiPAP and on high flow 45 L and nonrebreather mask.  He appears comfortable with SPO2 in the high 80s.  He denies chest pain, headache or abdominal pain.  He states that he did not get the vaccine and he will definitely get it after he gets out of the hospital.  I called and spoke to patient's spouse, [name].  I updated her with patient's current respiratory status and he is currently on high flow and nonrebreather with acceptable SPO2.  She voiced understanding.  All questions were answered.  Objective    Blood pressure 139/83, pulse 82, temperature 97.8 F (36.6 C), temperature source Oral, resp. rate (!) 21, height 6\' 2"  (1.88 m), weight 95.9 kg, SpO2 96 %.    Vent Mode: BIPAP;PCV FiO2 (%):  [100 %] 100 % Set Rate:  [10 bmp-15 bmp] 10 bmp PEEP:  [8 cmH20-10 cmH20] 10 cmH20   Intake/Output Summary (Last 24 hours) at 04/14/2020 1126 Last data filed at 04/14/2020 0900 Gross per 24 hour  Intake --  Output 2920 ml  Net -2920 ml   Filed Weights   04/07/20 1738 04/09/20 0400 04/10/20 0500  Weight: 96.6 kg 96.6 kg 95.9 kg    Examination: General: Caucasian male sitting up in bed, no acute distress HENT: No scleral icterus, no eye discharge Lungs: Crackles noted bilateral lung base Cardiovascular: Regular rate rhythm, no murmurs Abdomen: Normal bowel sounds, soft Extremities: No lower extremity edema Neuro: Alert and awake Skin: No rash   Resolved Hospital Problem list     Assessment & Plan:  Acute hypoxemic respiratory failure secondary to COVID-19: High risk for intubation Continue heated high flow and nonrebreather.  Currently on 45 L NIV as needed Target O2 saturations for goal SpO2 >85% at rest and ideally 75% with movement Monitor closely for signs of ventilatory failure such as nasal flaring, accessory muscle use, abdominal paradoxical movements.  Low threshold for intubation Self-proning while in bed Continue Remdesivir, baricitinib and Solu-Medrol per pharmacy Pulmonary hygiene including bronchodilator and flutter valve Mobilize as tolerated Hold diuresis today given worsening renal function.  Creatinine  1.29 Negative fluid status  AKI Baseline creatinine of 0.9.  Likely secondary to Lasix versus prerenal.  Will hold Lasix today.  Encourage p.o. liquid -BMP daily -Trend creatinine  CAD s/p CABG Chronic diastolic heart failure Continue home aspirin, BB and statin PRN diuretics  Type II DM Goal CBG less than 180 -Levemir 25 units twice daily -NovoLog 12  units mealtime coverage -SSI  Cirrhosis, cryptogenic No inpatient management  Best practice:  Diet: Heart healthy Pain/Anxiety/Delirium protocol (if indicated): N/A VAP protocol (if indicated): As above DVT prophylaxis: Lovenox GI prophylaxis: Protonix Glucose control: Levemir and NovoLog Mobility: As tolerated Code Status: Full Family Communication: Updated wife Disposition: ICU   Critical care time: 53 min    Gaylan Gerold, DO Internal Medicine Residency My pager: [phone]

## 2020-04-14 NOTE — Evaluation (Signed)
Physical Therapy Evaluation Patient Details Name: Alexander Parks MRN: [id number] DOB: 12/30/1950 Today's Date: 04/14/2020   History of Present Illness  [name] is a 69 year old male who presented with shortness of breath, cough and positive covid test found hypoxemic. He was admitted [place] for acute hypoxemic respiratory failure secondary to COVID-19. Transferred to [facility]. He was treated with remdesivir, solumedrol and barcitinib. He has completed remdesivir. He has been diuresed but no lasix was given in the last 72 hours. He developed worsening oxygen requirement with flow increased from 40L to 60L and then transferred to the ICU on 8/30.  Clinical Impression  Pt admitted with/for SOB/hopoxemia due to covid.  Pt mobilizing at a min guard/min level, but needs moderate coaching for efficient breathing to keep RR and sats at adequate levels.  Pt currently limited functionally due to the problems listed below.  (see problems list.)  Pt will benefit from PT to maximize function and safety to be able to get home safely with available assist.     Follow Up Recommendations Home health PT (vs No f/u if he is covid Neg.)    Equipment Recommendations  None recommended by PT    Recommendations for Other Services       Precautions / Restrictions Precautions Precautions: Fall      Mobility  Bed Mobility Overal bed mobility: Needs Assistance Bed Mobility: Supine to Sit;Sit to Supine     Supine to sit: Min guard Sit to supine: Min guard      Transfers Overall transfer level: Needs assistance   Transfers: Sit to/from [employer] Transfers Sit to Stand: Min assist Stand pivot transfers: Min assist;+2 safety/equipment       General transfer comment: stability assist during pivot, coaching pt to breathe efficiently as sats fell  Ambulation/Gait             General Gait Details: NT due to declining sats during transfer and limitations of lines  Stairs             Wheelchair Mobility    Modified Rankin (Stroke Patients Only)       Balance Overall balance assessment: Mild deficits observed, not formally tested                                           Pertinent Vitals/Pain Pain Assessment: No/denies pain    Home Living Family/patient expects to be discharged to:: Private residence Living Arrangements: Spouse/significant other Available Help at Discharge: Family;Available 24 hours/day Type of Home: House Home Access: Stairs to enter Entrance Stairs-Rails: Right;Left;Can reach both Entrance Stairs-Number of Steps: 6 Home Layout: One level Home Equipment: Shower seat;Shower seat - built in;Grab bars - tub/shower;Hand held shower head      Prior Function Level of Independence: Independent         Comments: long haul truck Medical illustrator Dominance   Dominant Hand: Right    Extremity/Trunk Assessment   Upper Extremity Assessment Upper Extremity Assessment: Defer to OT evaluation    Lower Extremity Assessment Lower Extremity Assessment: Overall WFL for tasks assessed (proximal weaknesses)       Communication   Communication: No difficulties  Cognition Arousal/Alertness: Awake/alert Behavior During Therapy: WFL for tasks assessed/performed Overall Cognitive Status: Within Functional Limits for tasks assessed  General Comments General comments (skin integrity, edema, etc.): During original transfer on 45L at 100% FiO2, HR 107 bpm sats falling to low of 72% through 88% without NRB mask, RR up to 40's but in the 20's to 30's with coaching.  Standing for peri-care with NRB, down to 79%, RR in the 30's and HR 110 bpm.    Exercises     Assessment/Plan    PT Assessment Patient needs continued PT services  PT Problem List Decreased strength;Decreased activity tolerance;Decreased mobility;Decreased balance;Cardiopulmonary status limiting activity        PT Treatment Interventions Gait training;Functional mobility training;Therapeutic activities;Patient/family education    PT Goals (Current goals can be found in the Care Plan section)  Acute Rehab PT Goals Patient Stated Goal: better and back home PT Goal Formulation: With patient Time For Goal Achievement: 04/28/20 Potential to Achieve Goals: Good    Frequency Min 3X/week   Barriers to discharge   wife is at home, but does not have covid.  Worried about pt being at home until covid neg.    Co-evaluation               AM-PAC PT "6 Clicks" Mobility  Outcome Measure Help needed turning from your back to your side while in a flat bed without using bedrails?: A Little Help needed moving from lying on your back to sitting on the side of a flat bed without using bedrails?: A Little Help needed moving to and from a bed to a chair (including a wheelchair)?: A Little Help needed standing up from a chair using your arms (e.g., wheelchair or bedside chair)?: A Little Help needed to walk in hospital room?: A Lot Help needed climbing 3-5 steps with a railing? : A Lot 6 Click Score: 16    End of Session Equipment Utilized During Treatment: Oxygen Activity Tolerance: Patient tolerated treatment well Patient left: in bed;with call bell/phone within reach;with bed alarm set Nurse Communication: Mobility status PT Visit Diagnosis: Other abnormalities of gait and mobility (R26.89);Difficulty in walking, not elsewhere classified (R26.2)    Time: 1537-9432 PT Time Calculation (min) (ACUTE ONLY): 27 min   Charges:   PT Evaluation $PT Eval Moderate Complexity: 1 Mod          04/14/2020  Ginger Carne., PT Acute Rehabilitation Services [phone]  (pager) [phone]  (office)  Alexander Parks 04/14/2020, 5:37 PM

## 2020-04-14 NOTE — Progress Notes (Signed)
Noticed some EKG changes on monitor, 12-lead EKG taken. Results in chart. Elink made aware.

## 2020-04-14 NOTE — Progress Notes (Signed)
Pt taken off bipap and placed on 45L/100% HHFNC & 15L NRB. Pt denies SOB, slight increased WOB. RT will closely monitor for possible bipap need

## 2020-04-15 LAB — CBC
HCT: 40.9 % (ref 39.0–52.0)
Hemoglobin: 13.6 g/dL (ref 13.0–17.0)
MCH: 27.8 pg (ref 26.0–34.0)
MCHC: 33.3 g/dL (ref 30.0–36.0)
MCV: 83.5 fL (ref 80.0–100.0)
Platelets: 143 10*3/uL — ABNORMAL LOW (ref 150–400)
RBC: 4.9 MIL/uL (ref 4.22–5.81)
RDW: 16.6 % — ABNORMAL HIGH (ref 11.5–15.5)
WBC: 15.6 10*3/uL — ABNORMAL HIGH (ref 4.0–10.5)
nRBC: 0 % (ref 0.0–0.2)

## 2020-04-15 LAB — GLUCOSE, CAPILLARY
Glucose-Capillary: 125 mg/dL — ABNORMAL HIGH (ref 70–99)
Glucose-Capillary: 217 mg/dL — ABNORMAL HIGH (ref 70–99)
Glucose-Capillary: 251 mg/dL — ABNORMAL HIGH (ref 70–99)
Glucose-Capillary: 302 mg/dL — ABNORMAL HIGH (ref 70–99)
Glucose-Capillary: 78 mg/dL (ref 70–99)
Glucose-Capillary: 90 mg/dL (ref 70–99)

## 2020-04-15 LAB — COMPREHENSIVE METABOLIC PANEL
ALT: 89 U/L — ABNORMAL HIGH (ref 0–44)
AST: 66 U/L — ABNORMAL HIGH (ref 15–41)
Albumin: 2.5 g/dL — ABNORMAL LOW (ref 3.5–5.0)
Alkaline Phosphatase: 95 U/L (ref 38–126)
Anion gap: 12 (ref 5–15)
BUN: 44 mg/dL — ABNORMAL HIGH (ref 8–23)
CO2: 22 mmol/L (ref 22–32)
Calcium: 9.3 mg/dL (ref 8.9–10.3)
Chloride: 102 mmol/L (ref 98–111)
Creatinine, Ser: 0.93 mg/dL (ref 0.61–1.24)
GFR calc Af Amer: >60 mL/min (ref >60)
GFR calc non Af Amer: >60 mL/min (ref >60)
Glucose, Bld: 91 mg/dL (ref 70–99)
Potassium: 4.1 mmol/L (ref 3.5–5.1)
Sodium: 136 mmol/L (ref 135–145)
Total Bilirubin: 2.1 mg/dL — ABNORMAL HIGH (ref 0.3–1.2)
Total Protein: 8.7 g/dL — ABNORMAL HIGH (ref 6.5–8.1)

## 2020-04-15 LAB — MAGNESIUM: Magnesium: 2.2 mg/dL (ref 1.7–2.4)

## 2020-04-15 LAB — C-REACTIVE PROTEIN: CRP: 3.1 mg/dL — ABNORMAL HIGH (ref <1.0)

## 2020-04-15 LAB — BRAIN NATRIURETIC PEPTIDE: B Natriuretic Peptide: 141.5 pg/mL — ABNORMAL HIGH (ref 0.0–100.0)

## 2020-04-15 LAB — D-DIMER, QUANTITATIVE: D-Dimer, Quant: 1.85 ug/mL-FEU — ABNORMAL HIGH (ref 0.00–0.50)

## 2020-04-15 LAB — PROCALCITONIN: Procalcitonin: <0.1 ng/mL

## 2020-04-15 MED ORDER — FUROSEMIDE 10 MG/ML IJ SOLN
40.0000 mg | Freq: Once | INTRAMUSCULAR | Status: AC
  Administered 2020-04-15: 40 mg via INTRAVENOUS
  Filled 2020-04-15: qty 4

## 2020-04-15 MED ORDER — INSULIN ASPART 100 UNIT/ML ~~LOC~~ SOLN
6.0000 [IU] | Freq: Three times a day (TID) | SUBCUTANEOUS | Status: DC
  Administered 2020-04-15 – 2020-04-22 (×21): 6 [IU] via SUBCUTANEOUS

## 2020-04-15 MED ORDER — ENOXAPARIN SODIUM 60 MG/0.6ML ~~LOC~~ SOLN
50.0000 mg | SUBCUTANEOUS | Status: DC
  Administered 2020-04-16 – 2020-04-23 (×8): 50 mg via SUBCUTANEOUS
  Filled 2020-04-15 (×8): qty 0.6

## 2020-04-15 NOTE — Progress Notes (Signed)
Physical Therapy Treatment Patient Details Name: Alexander Parks MRN: [id number] DOB: Jan 13, 1951 Today's Date: 04/15/2020    History of Present Illness [name] is a 69 year old male who presented with shortness of breath, cough and positive covid test found hypoxemic. He was admitted [place] for acute hypoxemic respiratory failure secondary to COVID-19. Transferred to [facility]. He was treated with remdesivir, solumedrol and barcitinib. He has completed remdesivir. He has been diuresed but no lasix was given in the last 72 hours. He developed worsening oxygen requirement with flow increased from 40L to 60L and then transferred to the ICU on 8/30.    PT Comments    Pt agreeable to activity OOB.  Emphasis is mainly on coaching for efficient slowed breathing, but pt completed some standing,  pregait activity and 3 transfers while coached to keep his sats up into the lower 80's   Follow Up Recommendations  Home health PT     Equipment Recommendations  None recommended by PT    Recommendations for Other Services       Precautions / Restrictions Precautions Precautions: Fall    Mobility  Bed Mobility Overal bed mobility: Needs Assistance Bed Mobility: Supine to Sit;Sit to Supine     Supine to sit: Min guard        Transfers Overall transfer level: Needs assistance Equipment used:  (tray table) Transfers: Sit to/from [facility] Sit to Stand: Min assist (x3) Stand pivot transfers: Min assist;Min guard (bed to chair, chair to [facility] and BSC back to chair.)       General transfer comment: guard to min stability assist during the transfer, but no assist sit to stand.  Ambulation/Gait             General Gait Details: pivot only   Stairs             Wheelchair Mobility    Modified Rankin (Stroke Patients Only)       Balance Overall balance assessment: Needs assistance Sitting-balance support: No upper extremity supported;Single extremity  supported Sitting balance-Leahy Scale: Fair     Standing balance support: Single extremity supported;No upper extremity supported Standing balance-Leahy Scale: Poor Standing balance comment: pt reliant on minor use of UE on a stable object.                            Cognition Arousal/Alertness: Awake/alert Behavior During Therapy: WFL for tasks assessed/performed Overall Cognitive Status: Within Functional Limits for tasks assessed (slower to process vs HOH or both)                                        Exercises      General Comments General comments (skin integrity, edema, etc.): Pt on 50L at 100%, resting at 84%, HR 94 bpm, BP 123/98 RR 23 rpm,  During transfers and on the BSC, sats 75-82% with HR in the 90's and 100's, RR 20'30's. Pt having a difficult time with efficient breathing, needing consistent coaching      Pertinent Vitals/Pain Pain Assessment: No/denies pain    Home Living                      Prior Function            PT Goals (current goals can now be found in the care  plan section) Acute Rehab PT Goals Patient Stated Goal: better and back home PT Goal Formulation: With patient Time For Goal Achievement: 04/28/20 Potential to Achieve Goals: Good Progress towards PT goals: Progressing toward goals    Frequency    Min 3X/week      PT Plan Current plan remains appropriate    Co-evaluation              AM-PAC PT "6 Clicks" Mobility   Outcome Measure  Help needed turning from your back to your side while in a flat bed without using bedrails?: A Little Help needed moving from lying on your back to sitting on the side of a flat bed without using bedrails?: A Little Help needed moving to and from a bed to a chair (including a wheelchair)?: A Little Help needed standing up from a chair using your arms (e.g., wheelchair or bedside chair)?: A Little Help needed to walk in hospital room?: A Lot Help needed  climbing 3-5 steps with a railing? : A Lot 6 Click Score: 16    End of Session Equipment Utilized During Treatment: Oxygen Activity Tolerance: Patient tolerated treatment well;Patient limited by fatigue Patient left: in chair;with call bell/phone within reach;with chair alarm set Nurse Communication: Mobility status PT Visit Diagnosis: Other abnormalities of gait and mobility (R26.89);Difficulty in walking, not elsewhere classified (R26.2)     Time: 5102-5852 PT Time Calculation (min) (ACUTE ONLY): 43 min  Charges:  $Therapeutic Activity: 38-52 mins                     04/15/2020  Alexander Carne., PT Acute Rehabilitation Services [phone]  (pager) [phone]  (office)   Alexander Parks 04/15/2020, 6:18 PM

## 2020-04-15 NOTE — Progress Notes (Signed)
NAME:  Alexander Parks, MRN:  [id number], DOB:  [birth year], LOS: 9 ADMISSION DATE:  04/12/2020, CONSULTATION DATE:  04/13/20 REFERRING MD:  Lala Lund, MD CHIEF COMPLAINT:  AHRF 2/2 COVID  Brief History   69 year old male admitted for acute hypoxemic respiratory failure secondary to COVID. Admitted 8/23. Transferred to ICU 8/30 for increased O2 needs.  History of present illness   Alexander Parks is a 69 year old male who presented with shortness of breath, cough and positive covid test found hypoxemic. He was admitted [place] for acute hypoxemic respiratory failure secondary to COVID-19. Transferred to [facility]. He was treated with remdesivir, solumedrol and barcitinib. He has completed remdesivir. He has been diuresed but no lasix was given in the last 72 hours. He developed worsening oxygen requirement with flow increased from 40L to 60L and then transferred to the ICU on 8/30.  Past Medical History  CAD s/p CABG Cirrhosis Chronic diastolic heart failure Type II Diabetes mellitus  Significant Hospital Events   8/23 Admitted to [facility] 8/29 Transferred to [facility] 8/30 Transferred to ICU for increased O2 requirement  Consults:  PCCM  Procedures:    Significant Diagnostic Tests:  8/23+ Covid PCR  Micro Data:  8/23 - blood culture  Antimicrobials:  Remdesivir 8/23-8/27 Baricitinib 8/24>  Interim history/subjective:  No acute event overnight  noted qt interval with change in meds  Tolerating no NIV and no NRB with sats in high 80's with exertion or eating and >90 when at rest. Pt endorses feeling less sob. Tolerating po  Objective   Blood pressure (!) 154/101, pulse 95, temperature (!) 97.2 F (36.2 C), temperature source Oral, resp. rate 20, height 6\' 2"  (1.88 m), weight 95.9 kg, SpO2 (!) 85 %.    FiO2 (%):  [100 %] 100 %   Intake/Output Summary (Last 24 hours) at 04/15/2020 1444 Last data filed at 04/15/2020 1200 Gross per 24 hour  Intake 240 ml  Output 1125 ml  Net -885 ml     Filed Weights   04/07/20 1738 04/09/20 0400 04/10/20 0500  Weight: 96.6 kg 96.6 kg 95.9 kg    Examination: General: Caucasian male sitting up in bed, no acute distress HENT: No scleral icterus, no eye discharge Lungs: Crackles noted bilateral lung base Cardiovascular: Regular rate rhythm, no murmurs Abdomen: Normal bowel sounds, soft Extremities: No lower extremity edema Neuro: Alert and awake Skin: No rash   Resolved Hospital Problem list     Assessment & Plan:  Acute hypoxemic respiratory failure secondary to COVID-19: High risk for intubation Continue heated high flow and nrb as needed Target O2 saturations for goal SpO2 >85%  Monitor closely for signs of ventilatory failure such as nasal flaring, accessory muscle use, abdominal paradoxical movements.  Low threshold for intubation Self-proning while in bed Continue Remdesivir, baricitinib and Solu-Medrol per pharmacy Pulmonary hygiene including bronchodilator and flutter valve Mobilize as tolerated Renal function improved, consider diuresis later today. Give 40 now.  Negative fluid status  AKI Baseline creatinine of 0.9.   -BMP daily -Trend creatinine  CAD s/p CABG Chronic diastolic heart failure Continue home aspirin, BB and statin PRN diuretics  Type II DM Goal CBG less than 180 -ssi and basal insulin  Cirrhosis, cryptogenic No inpatient management  Best practice:  Diet: Heart healthy Pain/Anxiety/Delirium protocol (if indicated): N/A VAP protocol (if indicated): As above DVT prophylaxis: Lovenox GI prophylaxis: Protonix Glucose control: Levemir and NovoLog Mobility: As tolerated Code Status: Full Family Communication: Updated wife Disposition:  ICU   Critical care time: The patient is critically ill with multiple organ systems failure and requires high complexity decision making for assessment and support, frequent evaluation and titration of therapies, application of advanced monitoring  technologies and extensive interpretation of multiple databases.  Critical care time 34 mins. This represents my time independent of the NPs time taking care of the pt. This is excluding procedures.    [facility] 04/15/2020, 2:44 PM

## 2020-04-15 NOTE — Progress Notes (Signed)
RT called to patient's room due to [facility] alarm.  Patient sitting in recliner after having used the bedside commopde, sats 71%.  The temperature probe on the Heated High Flow had gotten disconnected from the heater.  Patient's sats recovered to 74%.  RT will continue to monitor.

## 2020-04-15 DEATH — deceased

## 2020-04-16 LAB — GLUCOSE, CAPILLARY
Glucose-Capillary: 117 mg/dL — ABNORMAL HIGH (ref 70–99)
Glucose-Capillary: 239 mg/dL — ABNORMAL HIGH (ref 70–99)
Glucose-Capillary: 261 mg/dL — ABNORMAL HIGH (ref 70–99)
Glucose-Capillary: 275 mg/dL — ABNORMAL HIGH (ref 70–99)
Glucose-Capillary: 275 mg/dL — ABNORMAL HIGH (ref 70–99)
Glucose-Capillary: 98 mg/dL (ref 70–99)

## 2020-04-16 LAB — COMPREHENSIVE METABOLIC PANEL
ALT: 84 U/L — ABNORMAL HIGH (ref 0–44)
AST: 56 U/L — ABNORMAL HIGH (ref 15–41)
Albumin: 2.3 g/dL — ABNORMAL LOW (ref 3.5–5.0)
Alkaline Phosphatase: 97 U/L (ref 38–126)
Anion gap: 11 (ref 5–15)
BUN: 47 mg/dL — ABNORMAL HIGH (ref 8–23)
CO2: 25 mmol/L (ref 22–32)
Calcium: 9.5 mg/dL (ref 8.9–10.3)
Chloride: 101 mmol/L (ref 98–111)
Creatinine, Ser: 1.12 mg/dL (ref 0.61–1.24)
GFR calc Af Amer: >60 mL/min (ref >60)
GFR calc non Af Amer: >60 mL/min (ref >60)
Glucose, Bld: 142 mg/dL — ABNORMAL HIGH (ref 70–99)
Potassium: 4.1 mmol/L (ref 3.5–5.1)
Sodium: 137 mmol/L (ref 135–145)
Total Bilirubin: 2.1 mg/dL — ABNORMAL HIGH (ref 0.3–1.2)
Total Protein: 6.9 g/dL (ref 6.5–8.1)

## 2020-04-16 LAB — CBC
HCT: 40.4 % (ref 39.0–52.0)
Hemoglobin: 13.5 g/dL (ref 13.0–17.0)
MCH: 27.9 pg (ref 26.0–34.0)
MCHC: 33.4 g/dL (ref 30.0–36.0)
MCV: 83.5 fL (ref 80.0–100.0)
Platelets: 149 10*3/uL — ABNORMAL LOW (ref 150–400)
RBC: 4.84 MIL/uL (ref 4.22–5.81)
RDW: 16.4 % — ABNORMAL HIGH (ref 11.5–15.5)
WBC: 18.4 10*3/uL — ABNORMAL HIGH (ref 4.0–10.5)
nRBC: 0 % (ref 0.0–0.2)

## 2020-04-16 LAB — PROCALCITONIN: Procalcitonin: <0.1 ng/mL

## 2020-04-16 LAB — MAGNESIUM: Magnesium: 2.2 mg/dL (ref 1.7–2.4)

## 2020-04-16 LAB — CALCIUM, IONIZED: Calcium, Ionized, Serum: 5.2 mg/dL (ref 4.5–5.6)

## 2020-04-16 LAB — D-DIMER, QUANTITATIVE: D-Dimer, Quant: 1.84 ug/mL-FEU — ABNORMAL HIGH (ref 0.00–0.50)

## 2020-04-16 LAB — C-REACTIVE PROTEIN: CRP: 3.3 mg/dL — ABNORMAL HIGH (ref <1.0)

## 2020-04-16 LAB — BRAIN NATRIURETIC PEPTIDE: B Natriuretic Peptide: 160 pg/mL — ABNORMAL HIGH (ref 0.0–100.0)

## 2020-04-16 MED ORDER — GUAIFENESIN-DM 100-10 MG/5ML PO SYRP
10.0000 mL | ORAL_SOLUTION | ORAL | Status: DC | PRN
  Administered 2020-04-17: 10 mL via ORAL
  Filled 2020-04-16 (×2): qty 10

## 2020-04-16 NOTE — Progress Notes (Signed)
NAME:  GERMANY CHELF, MRN:  [id number], DOB:  1950/10/04, LOS: 2 ADMISSION DATE:  04/03/2020, CONSULTATION DATE:  8/30 REFERRING MD:  Lala Lund, CHIEF COMPLAINT:  Dyspnea  Brief History   69 year old male admitted for acute hypoxemic respiratory failure secondary to COVID. Admitted 8/23. Transferred to ICU 8/30 for increased O2 needs.  Past Medical History  CAD s/p CABG Cirrhosis Chronic diastolic heart failure Type II Diabetes mellitus  Significant Hospital Events   8/23 Admitted to [facility] 8/29 Transferred to [facility] 8/30 Transferred to ICU for increased O2 requirement  Consults:  PCCM  Procedures:    Significant Diagnostic Tests:    Micro Data:  8/23 SARS COV 2 > positive 8/23 - blood culture  Antimicrobials/COVID Rx  Remdesivir 8/23-8/27 Baricitinib 8/24>   Interim history/subjective:  9/2 says he feels OK, slept OK, has an appetite  Objective   Blood pressure 134/79, pulse (!) 103, temperature 98 F (36.7 C), temperature source Oral, resp. rate (!) 24, height 6\' 2"  (1.88 m), weight 95.9 kg, SpO2 (!) 85 %.    FiO2 (%):  [90 %-100 %] 100 %   Intake/Output Summary (Last 24 hours) at 04/16/2020 0933 Last data filed at 04/16/2020 0400 Gross per 24 hour  Intake 480 ml  Output 950 ml  Net -470 ml   Filed Weights   04/07/20 1738 04/09/20 0400 04/10/20 0500  Weight: 96.6 kg 96.6 kg 95.9 kg    Examination: General:  Resting comfortably in bed HENT: NCAT OP clear PULM: Rhonchi bases B, normal effort CV: RRR, no mgr GI: BS+, soft, nontender MSK: normal bulk and tone Neuro: awake, alert, no distress, MAEW    Resolved Hospital Problem list     Assessment & Plan:  Acute hypoxemic respiratory failure secondary to COVID-19: feels OK, work of breathing acceptable on 9/2 Continue solumedrol Continue baricitinib Continue heated high flow O2 Tolerate periods of hypoxemia, goal at rest is greater than 85% SaO2, with movement ideally above 75% Decision for  intubation should be based on a change in mental status or physical evidence of ventilatory failure such as nasal flaring, accessory muscle use, paradoxical breathing Out of bed to chair as able Incentive spirometry is important, use every hour Prone positioning while in bed  AKI : improved, stable Monitor BMET and UOP Replace electrolytes as needed  CAD s/p CABG Tele Coreg lipitor  DM2 SSI detemir  Cryptogenic cirrhosis Hepatic dose medications  Best practice:  Diet: regular diet Pain/Anxiety/Delirium protocol (if indicated): n/a VAP protocol (if indicated): n/a DVT prophylaxis: lovenox GI prophylaxis: famotidine Glucose control: SSI Mobility: bed rest Code Status: full Family Communication: will update today Disposition: move to [place], 5 W progressive care unit  Labs   CBC: Recent Labs  Lab 04/10/20 0443 04/10/20 0443 04/11/20 0446 04/11/20 0446 04/12/20 5732 04/13/20 0220 04/14/20 0631 04/15/20 0314 04/16/20 0448  WBC 8.4   < > 11.2*   < > 12.2* 9.9 14.0* 15.6* 18.4*  NEUTROABS 6.7  --  9.2*  --   --   --   --   --   --   HGB 12.5*   < > 12.4*   < > 12.6* 11.7* 13.6 13.6 13.5  HCT 37.9*   < > 39.1   < > 38.7* 36.7* 40.8 40.9 40.4  MCV 84.8   < > 87.5   < > 86.8 86.8 84.1 83.5 83.5  PLT 100*   < > 123*   < > 127* 108* 142* 143*  149*   < > = values in this interval not displayed.    Basic Metabolic Panel: Recent Labs  Lab 04/10/20 0443 04/10/20 0443 04/11/20 0446 04/11/20 0446 04/12/20 0632 04/13/20 0220 04/13/20 0800 04/14/20 0631 04/14/20 2159 04/15/20 0314 04/16/20 0448  NA 139   < > 139  --    < > 135  --  140 137 136 137  K 4.2   < > 3.8  --    < > 4.5  --  3.8 4.2 4.1 4.1  CL 109   < > 108  --    < > 103  --  101 103 102 101  CO2 20*   < > 22  --    < > 21*  --  27 21* 22 25  GLUCOSE 138*   < > 85  --    < > 245*  --  109* 180* 91 142*  BUN 34*   < > 35*  --    < > 39*  --  45* 44* 44* 47*  CREATININE 0.91   < > 0.91  --    < > 1.10  --   1.29* 1.10 0.93 1.12  CALCIUM 8.4*   < > 8.4*  --    < > 8.6*  --  9.1 9.1 9.3 9.5  MG 2.0   < > 2.0   < >  --   --  2.2 2.2 2.1 2.2 2.2  PHOS 3.2  --  3.4  --   --   --   --   --   --   --   --    < > = values in this interval not displayed.   GFR: Estimated Creatinine Clearance: 72.4 mL/min (by C-G formula based on SCr of 1.12 mg/dL). Recent Labs  Lab 04/13/20 0220 04/14/20 0631 04/15/20 0314 04/16/20 0448  PROCALCITON <0.10 0.11 <0.10 <0.10  WBC 9.9 14.0* 15.6* 18.4*    Liver Function Tests: Recent Labs  Lab 04/12/20 2202 04/13/20 0220 04/14/20 0631 04/15/20 0314 04/16/20 0448  AST 65* 57* 49* 66* 56*  ALT 78* 68* 71* 89* 84*  ALKPHOS 74 75 79 95 97  BILITOT 1.9* 2.0* 2.3* 2.1* 2.1*  PROT 6.8 6.2* 7.6 8.7* 6.9  ALBUMIN 2.7* 2.4* 2.6* 2.5* 2.3*   No results for input(s): LIPASE, AMYLASE in the last 168 hours. No results for input(s): AMMONIA in the last 168 hours.  ABG    Component Value Date/Time   PHART 7.387 02/09/2018 0558   PCO2ART 38.4 02/09/2018 0558   PO2ART 90.0 02/09/2018 0558   HCO3 23.0 02/09/2018 0558   TCO2 21 (L) 02/09/2018 1620   ACIDBASEDEF 2.0 02/09/2018 0558   O2SAT 67.4 02/10/2018 0335     Coagulation Profile: No results for input(s): INR, PROTIME in the last 168 hours.  Cardiac Enzymes: No results for input(s): CKTOTAL, CKMB, CKMBINDEX, TROPONINI in the last 168 hours.  HbA1C: Hemoglobin A1C  Date/Time Value Ref Range Status  04/29/2014 03:24 PM 8.8 (H) 4.2 - 6.3 % Final    Comment:    The American Diabetes Association recommends that a primary goal of therapy should be <7% and that physicians should reevaluate the treatment regimen in patients with HbA1c values consistently >8%.    Hgb A1c MFr Bld  Date/Time Value Ref Range Status  03/24/2020 11:59 AM 7.9 (H) 4.8 - 5.6 % Final    Comment:    (NOTE)  Prediabetes: 5.7 - 6.4         Diabetes: >6.4         Glycemic control for adults with diabetes: <7.0   02/10/2020  09:32 AM 7.3 (H) 4.8 - 5.6 % Final    Comment:    (NOTE) Pre diabetes:          5.7%-6.4%  Diabetes:              >6.4%  Glycemic control for   <7.0% adults with diabetes     CBG: Recent Labs  Lab 04/15/20 1546 04/15/20 1958 04/15/20 2335 04/16/20 0327 04/16/20 0731  GLUCAP 251* 302* 125* 98 117*       Critical care time: 30 minutes     Roselie Awkward, MD [place] [facility] Pager: [phone] Cell: [phone] If no response, call [phone]

## 2020-04-16 NOTE — Progress Notes (Addendum)
Pt w/ waxing O2 requirements, requiring NRB 100% +HFNC up to 50L and 100% most of the last hour. Pt appears to remain with a very depleted reserve.  With attempts at weaning O2, pt decompensated, dusky appearing with some increased WOB. Return to relative stable reasonably after O2 delivery increased

## 2020-04-16 NOTE — Progress Notes (Signed)
Inpatient Diabetes Program Recommendations  AACE/ADA: New Consensus Statement on Inpatient Glycemic Control (2015)  Target Ranges:  Prepandial:   less than 140 mg/dL      Peak postprandial:   less than 180 mg/dL (1-2 hours)      Critically ill patients:  140 - 180 mg/dL   Lab Results  Component Value Date   GLUCAP 275 (H) 04/16/2020   HGBA1C 7.9 (H) 04/01/2020    Review of Glycemic Control Results for Alexander Parks, Alexander Parks (MRN [id number]) as of 04/16/2020 12:51  Ref. Range 04/15/2020 19:58 04/15/2020 23:35 04/16/2020 03:27 04/16/2020 07:31 04/16/2020 11:13  Glucose-Capillary Latest Ref Range: 70 - 99 mg/dL 302 (H) 125 (H) 98 117 (H) 275 (H)   Diabetes history: DM 2 Outpatient Diabetes medications: Metformin XR 1000 mg bid Current orders for Inpatient glycemic control:  Novolog resistant q 4 hours, Novolog 6 units tid with meals, Levemir 25 units bid Solumedrol 40 mg daily Inpatient Diabetes Program Recommendations:   Consider increasing Novolog meal coverage to 10 units tid with meals.   Thank s Adah Perl, RN, BC-ADM Inpatient Diabetes Coordinator Pager [phone] (8a-5p)

## 2020-04-17 DIAGNOSIS — L8990 Pressure ulcer of unspecified site, unspecified stage: Secondary | ICD-10-CM | POA: Insufficient documentation

## 2020-04-17 LAB — COMPREHENSIVE METABOLIC PANEL
ALT: 87 U/L — ABNORMAL HIGH (ref 0–44)
AST: 57 U/L — ABNORMAL HIGH (ref 15–41)
Albumin: 2.4 g/dL — ABNORMAL LOW (ref 3.5–5.0)
Alkaline Phosphatase: 107 U/L (ref 38–126)
Anion gap: 9 (ref 5–15)
BUN: 47 mg/dL — ABNORMAL HIGH (ref 8–23)
CO2: 26 mmol/L (ref 22–32)
Calcium: 9 mg/dL (ref 8.9–10.3)
Chloride: 99 mmol/L (ref 98–111)
Creatinine, Ser: 1.02 mg/dL (ref 0.61–1.24)
GFR calc Af Amer: >60 mL/min (ref >60)
GFR calc non Af Amer: >60 mL/min (ref >60)
Glucose, Bld: 167 mg/dL — ABNORMAL HIGH (ref 70–99)
Potassium: 4.3 mmol/L (ref 3.5–5.1)
Sodium: 134 mmol/L — ABNORMAL LOW (ref 135–145)
Total Bilirubin: 2.2 mg/dL — ABNORMAL HIGH (ref 0.3–1.2)
Total Protein: 7 g/dL (ref 6.5–8.1)

## 2020-04-17 LAB — GLUCOSE, CAPILLARY
Glucose-Capillary: 165 mg/dL — ABNORMAL HIGH (ref 70–99)
Glucose-Capillary: 136 mg/dL — ABNORMAL HIGH (ref 70–99)
Glucose-Capillary: 221 mg/dL — ABNORMAL HIGH (ref 70–99)
Glucose-Capillary: 356 mg/dL — ABNORMAL HIGH (ref 70–99)
Glucose-Capillary: 175 mg/dL — ABNORMAL HIGH (ref 70–99)
Glucose-Capillary: 112 mg/dL — ABNORMAL HIGH (ref 70–99)

## 2020-04-17 LAB — PROCALCITONIN: Procalcitonin: <0.1 ng/mL

## 2020-04-17 LAB — CBC
HCT: 40.5 % (ref 39.0–52.0)
Hemoglobin: 13.3 g/dL (ref 13.0–17.0)
MCH: 27.4 pg (ref 26.0–34.0)
MCHC: 32.8 g/dL (ref 30.0–36.0)
MCV: 83.3 fL (ref 80.0–100.0)
Platelets: 158 10*3/uL (ref 150–400)
RBC: 4.86 MIL/uL (ref 4.22–5.81)
RDW: 16.6 % — ABNORMAL HIGH (ref 11.5–15.5)
WBC: 20.7 10*3/uL — ABNORMAL HIGH (ref 4.0–10.5)
nRBC: 0 % (ref 0.0–0.2)

## 2020-04-17 LAB — BRAIN NATRIURETIC PEPTIDE: B Natriuretic Peptide: 307.2 pg/mL — ABNORMAL HIGH (ref 0.0–100.0)

## 2020-04-17 LAB — MAGNESIUM: Magnesium: 2.1 mg/dL (ref 1.7–2.4)

## 2020-04-17 LAB — D-DIMER, QUANTITATIVE: D-Dimer, Quant: 1.7 ug/mL-FEU — ABNORMAL HIGH (ref 0.00–0.50)

## 2020-04-17 LAB — C-REACTIVE PROTEIN: CRP: 4.6 mg/dL — ABNORMAL HIGH (ref <1.0)

## 2020-04-17 MED ORDER — METHYLPREDNISOLONE SODIUM SUCC 40 MG IJ SOLR: 20.0000 mg | Freq: Every day | INTRAMUSCULAR | Status: DC

## 2020-04-17 MED ORDER — PANTOPRAZOLE SODIUM 40 MG PO TBEC
40.0000 mg | DELAYED_RELEASE_TABLET | Freq: Every day | ORAL | Status: DC
  Administered 2020-04-17 – 2020-04-22 (×6): 40 mg via ORAL
  Filled 2020-04-17 (×6): qty 1

## 2020-04-17 MED ORDER — FUROSEMIDE 10 MG/ML IJ SOLN
40.0000 mg | Freq: Once | INTRAMUSCULAR | Status: AC
  Administered 2020-04-17: 40 mg via INTRAVENOUS
  Filled 2020-04-17: qty 4

## 2020-04-17 NOTE — Progress Notes (Signed)
PROGRESS NOTE                                                                                                                                                                                                             Patient Demographics:    Alexander Parks, is a 69 y.o. male, DOB - 1951/03/30, VOJ:[id number]  Outpatient Primary MD for the patient is Sasser, Silvestre Moment, MD    LOS - 11  Admit date - 04/05/2020    Chief Complaint  Patient presents with  . covid       Brief Narrative - 69 y.o.malewith medical history significantfor coronary artery disease status post CABG x5, cirrhosis of the liver, CHF, diabetes mellitus and nephrolithiasis currently works as a Administrator and not been vaccinated for COVID-19 was admitted to the hospital for severe acute hypoxic respiratory failure due to COVID-19 pneumonia at [facility] and thereafter transferred to [facility] for further care on heated high flow 35 L, continued to get worse despite full medical care, he was transferred to ICU team on 04/14/2020 as he became BiPAP dependent for several days, he has been stabilized somewhat and currently on 30 to 35 L heated high flow oxygen and transferred back to hospitalist service on 04/17/2020.   Subjective:   Patient in bed, appears comfortable, denies any headache, no fever, no chest pain or pressure, +ve shortness of breath , no abdominal pain. No focal weakness.   Assessment  & Plan :    1. Acute Hypoxic Resp. Failure due to Acute Covid 19 Viral Pneumonitis during the ongoing 2020 Covid 19 Pandemic - he is unvaccinated and unfortunately has incurred severe parenchymal lung injury, he has been started on high-dose IV steroids, remdesivir and Barisitinib  combination.  Diurese as needed with IV Lasix, became BiPAP dependent for a few days in ICU, now slightly better on 30 to 40 L heated high flow, still having some increased work of breathing  and will need close monitoring, steroids being tapered.  Continue to monitor closely.  Still overall extremely tenuous.  Encouraged the patient to sit up in chair in the daytime use I-S and flutter valve for pulmonary toiletry and then prone in bed when at night.  Will advance activity and titrate down oxygen as possible.    SpO2: (!) 86 % O2 Flow Rate (  L/min): 30 L/min FiO2 (%): 100 %  Recent Labs  Lab 04/13/20 0220 04/13/20 0806 04/14/20 0631 04/15/20 0314 04/16/20 0448 04/17/20 0322  WBC 9.9  --  14.0* 15.6* 18.4* 20.7*  PLT 108*  --  142* 143* 149* 158  CRP 6.6*  --  3.8* 3.1* 3.3* 4.6*  BNP  --  151.4* 108.8* 141.5* 160.0* 307.2*  DDIMER 1.15*  --  1.29* 1.85* 1.84* 1.70*  PROCALCITON <0.10  --  0.11 <0.10 <0.10 <0.10  AST 57*  --  49* 66* 56* 57*  ALT 68*  --  71* 89* 84* 87*  ALKPHOS 75  --  79 95 97 107  BILITOT 2.0*  --  2.3* 2.1* 2.1* 2.2*  ALBUMIN 2.4*  --  2.6* 2.5* 2.3* 2.4*    2. CAD post CABG - no acute issues chest pain-free on aspirin, Coreg and statin for secondary prevention.  3.  Chronic diastolic CHF EF 99%.  As needed Lasix.  4.  Dyslipidemia.  On statin.  5.  GERD.  On PPI.  6.  Cryptogenic cirrhosis recently diagnosed through biopsy .  Follow with Dr. Laural Golden in [place].    7. DM type II.  Currently on steroids.  Have adjusted his Levemir and sliding scale dose will monitor and adjust as needed.    Lab Results  Component Value Date   HGBA1C 7.9 (H) 04/13/2020   CBG (last 3)  Recent Labs    04/17/20 0342 04/17/20 0800 04/17/20 1146  GLUCAP 165* 136* 221*      Condition - Extremely Guarded  Family Communication  :  Wife [phone] on 04/13/20, 04/17/20  Code Status :  Full  Consults  : PCCM  Procedures  :  None  PUD Prophylaxis : PPI  Disposition Plan  :    Status is: Inpatient  Remains inpatient appropriate because:IV treatments appropriate due to intensity of illness or inability to take PO   Dispo:  Patient From:  Home  Planned Disposition: To be determined  Expected discharge date: 04/11/20  Medically stable for discharge: No   DVT Prophylaxis  :  Lovenox   Lab Results  Component Value Date   PLT 158 04/17/2020    Diet :  Diet Order            Diet heart healthy/carb modified Room service appropriate? Yes; Fluid consistency: Thin  Diet effective now                  Inpatient Medications  Scheduled Meds: . vitamin C  500 mg Oral Daily  . aspirin EC  81 mg Oral Daily  . atorvastatin  80 mg Oral q1800  . baricitinib  4 mg Oral Daily  . carvedilol  3.125 mg Oral BID WC  . chlorhexidine  15 mL Mouth Rinse BID  . Chlorhexidine Gluconate Cloth  6 each Topical Daily  . enoxaparin (LOVENOX) injection  50 mg Subcutaneous Q24H  . feeding supplement (GLUCERNA SHAKE)  237 mL Oral TID BM  . furosemide  40 mg Intravenous Once  . insulin aspart  0-20 Units Subcutaneous Q4H  . insulin aspart  6 Units Subcutaneous TID WC  . insulin detemir  25 Units Subcutaneous BID  . Ipratropium-Albuterol  1 puff Inhalation Q6H  . mouth rinse  15 mL Mouth Rinse q12n4p  . [START ON 04/18/2020] methylPREDNISolone (SOLU-MEDROL) injection  20 mg Intravenous Daily  . zinc sulfate  220 mg Oral Daily   Continuous Infusions: PRN Meds:.acetaminophen, bisacodyl, chlorpheniramine-HYDROcodone,  docusate sodium, [DISCONTINUED] ondansetron **OR** ondansetron (ZOFRAN) IV, phenol  Antibiotics  :    Anti-infectives (From admission, onward)   Start     Dose/Rate Route Frequency Ordered Stop   04/10/20 1315  remdesivir 100 mg in sodium chloride 0.9 % 100 mL IVPB        100 mg 200 mL/hr over 30 Minutes Intravenous  Once 04/10/20 1301 04/10/20 1355   04/07/20 1000  remdesivir 100 mg in sodium chloride 0.9 % 100 mL IVPB  Status:  Discontinued       "Followed by" Linked Group Details   100 mg 200 mL/hr over 30 Minutes Intravenous Daily 03/22/2020 1348 04/10/20 1301   04/13/2020 1400  remdesivir 100 mg in sodium chloride 0.9 %  100 mL IVPB       "Followed by" Linked Group Details   100 mg 200 mL/hr over 30 Minutes Intravenous Every 1 hr x 2 03/27/2020 1348 03/25/2020 1559       Time Spent in minutes  30   Lala Lund M.D on 04/17/2020 at 1:08 PM  To page go to [email or web address] - password [facility]  [facility] -  Office  [phone]    See all Orders from today for further details    Objective:   Vitals:   04/17/20 1000 04/17/20 1100 04/17/20 1124 04/17/20 1200  BP: (!) 153/85 135/83  139/80  Pulse: (!) 105 (!) 102  99  Resp: (!) 25 (!) 27  (!) 28  Temp:   97.8 F (36.6 C)   TempSrc:   Oral   SpO2: (!) 86% 91%  (!) 86%  Weight:      Height:        Wt Readings from Last 3 Encounters:  04/10/20 95.9 kg  03/26/20 102.5 kg  02/21/20 103 kg     Intake/Output Summary (Last 24 hours) at 04/17/2020 1308 Last data filed at 04/17/2020 0800 Gross per 24 hour  Intake 477 ml  Output 700 ml  Net -223 ml     Physical Exam  Awake Alert, No new F.N deficits, Normal affect [place].AT,PERRAL Supple Neck,No JVD, No cervical lymphadenopathy appriciated.  Symmetrical Chest wall movement, Good air movement bilaterally, +ve rales RRR,No Gallops, Rubs or new Murmurs, No Parasternal Heave +ve B.Sounds, Abd Soft, No tenderness, No organomegaly appriciated, No rebound - guarding or rigidity. No Cyanosis, Clubbing or edema, No new Rash or bruise    Data Review:    CBC Recent Labs  Lab 04/11/20 0446 04/12/20 1443 04/13/20 0220 04/14/20 0631 04/15/20 0314 04/16/20 0448 04/17/20 0322  WBC 11.2*   < > 9.9 14.0* 15.6* 18.4* 20.7*  HGB 12.4*   < > 11.7* 13.6 13.6 13.5 13.3  HCT 39.1   < > 36.7* 40.8 40.9 40.4 40.5  PLT 123*   < > 108* 142* 143* 149* 158  MCV 87.5   < > 86.8 84.1 83.5 83.5 83.3  MCH 27.7   < > 27.7 28.0 27.8 27.9 27.4  MCHC 31.7   < > 31.9 33.3 33.3 33.4 32.8  RDW 16.3*   < > 16.4* 16.5* 16.6* 16.4* 16.6*  LYMPHSABS 1.1  --   --   --   --   --   --   MONOABS 0.8  --   --   --   --   --    --   EOSABS 0.0  --   --   --   --   --   --   BASOSABS  0.0  --   --   --   --   --   --    < > = values in this interval not displayed.    Recent Labs  Lab 04/13/20 0220 04/13/20 0220 04/13/20 0800 04/13/20 0806 04/14/20 0631 04/14/20 2159 04/15/20 0314 04/16/20 0448 04/17/20 0322  NA 135   < >  --   --  140 137 136 137 134*  K 4.5   < >  --   --  3.8 4.2 4.1 4.1 4.3  CL 103   < >  --   --  101 103 102 101 99  CO2 21*   < >  --   --  27 21* 22 25 26   GLUCOSE 245*   < >  --   --  109* 180* 91 142* 167*  BUN 39*   < >  --   --  45* 44* 44* 47* 47*  CREATININE 1.10   < >  --   --  1.29* 1.10 0.93 1.12 1.02  CALCIUM 8.6*   < >  --   --  9.1 9.1 9.3 9.5 9.0  AST 57*  --   --   --  49*  --  66* 56* 57*  ALT 68*  --   --   --  71*  --  89* 84* 87*  ALKPHOS 75  --   --   --  79  --  95 97 107  BILITOT 2.0*  --   --   --  2.3*  --  2.1* 2.1* 2.2*  ALBUMIN 2.4*  --   --   --  2.6*  --  2.5* 2.3* 2.4*  MG  --    < >   < >  --  2.2 2.1 2.2 2.2 2.1  CRP 6.6*  --   --   --  3.8*  --  3.1* 3.3* 4.6*  DDIMER 1.15*  --   --   --  1.29*  --  1.85* 1.84* 1.70*  PROCALCITON <0.10  --   --   --  0.11  --  <0.10 <0.10 <0.10  BNP  --   --   --  151.4* 108.8*  --  141.5* 160.0* 307.2*   < > = values in this interval not displayed.    ------------------------------------------------------------------------------------------------------------------ No results for input(s): CHOL, HDL, LDLCALC, TRIG, CHOLHDL, LDLDIRECT in the last 72 hours.  Lab Results  Component Value Date   HGBA1C 7.9 (H) 04/07/2020   ------------------------------------------------------------------------------------------------------------------ No results for input(s): TSH, T4TOTAL, T3FREE, THYROIDAB in the last 72 hours.  Invalid input(s): FREET3  Cardiac Enzymes No results for input(s): CKMB, TROPONINI, MYOGLOBIN in the last 168 hours.  Invalid input(s):  CK ------------------------------------------------------------------------------------------------------------------    Component Value Date/Time   BNP 307.2 (H) 04/17/2020 0322    Micro Results Recent Results (from the past 240 hour(s))  MRSA PCR Screening     Status: None   Collection Time: 04/07/20  6:35 PM   Specimen: Nasal Mucosa; Nasopharyngeal  Result Value Ref Range Status   MRSA by PCR NEGATIVE NEGATIVE Final    Comment:        The GeneXpert MRSA Assay (FDA approved for NASAL specimens only), is one component of a comprehensive MRSA colonization surveillance program. It is not intended to diagnose MRSA infection nor to guide or monitor treatment for MRSA infections. Performed at [facility], [address] [place], [place] [address]  Radiology Reports DG Chest Port 1 View  Result Date: 04/13/2020 CLINICAL DATA:  Shortness of breath, COVID-19 positive EXAM: PORTABLE CHEST 1 VIEW COMPARISON:  04/10/2020 FINDINGS: Post CABG changes. Stable cardiomediastinal contours. Atherosclerotic calcification of the aortic knob. Patchy opacities within the periphery of the left mid to lower lung fields, slightly progressed from prior. Streaky right basilar interstitial opacity. No large pleural fluid collection. No pneumothorax. IMPRESSION: Slight progression of airspace opacities, most pronounced in the periphery of the left lung. Findings favored to represent atypical/viral infection in the setting of known COVID 19. Electronically Signed   By: Davina Poke D.O.   On: 04/13/2020 08:26   DG Chest Port 1 View  Result Date: 04/10/2020 CLINICAL DATA:  Chest pain, shortness of breath and low oxygen saturation, recent diagnosis of COVID-19, diabetes mellitus, former smoker, cirrhosis, coronary artery disease post CABG EXAM: PORTABLE CHEST 1 VIEW COMPARISON:  Portable exam 1057 hours compared to 03/29/2020 FINDINGS: Upper normal heart size post CABG. Tortuous aorta with minimal  atherosclerotic calcification. Pulmonary vascularity normal. Interstitial infiltrates LEFT lung question atypical infection. No pleural effusion or pneumothorax. Osseous structures unremarkable. IMPRESSION: Infiltrates in mid to inferior LEFT lung question atypical infection. Electronically Signed   By: Lavonia Dana M.D.   On: 04/10/2020 11:23   DG Chest Port 1 View  Result Date: 03/15/2020 CLINICAL DATA:  69 year old male positive COVID-19.  Cough. EXAM: PORTABLE CHEST 1 VIEW COMPARISON:  02/21/2020 chest radiographs and earlier. FINDINGS: AP view at 1137 hours. Increased lordotic positioning. Possibly lower lung volumes. Stable cardiac size and mediastinal contours. Prior CABG. Chronic increased interstitial markings in both lungs, with mild new patchy and vague opacity along the right minor fissure and possibly also at the left lung base. No superimposed pneumothorax or pleural effusion. Visualized tracheal air column is within normal limits. No acute osseous abnormality identified. Paucity of bowel gas in the upper abdomen. IMPRESSION: Chronic pulmonary interstitial changes suspected with evidence of mild superimposed COVID-19 pneumonia. Electronically Signed   By: Genevie Ann M.D.   On: 04/14/2020 11:51

## 2020-04-17 NOTE — Progress Notes (Signed)
Occupational Therapy Treatment Patient Details Name: Alexander Parks MRN: [id number] DOB: 1950/10/04 Today's Date: 04/17/2020    History of present illness Mr. Carlen Fils is a 69 year old male who presented with shortness of breath, cough and positive covid test found hypoxemic. He was admitted [facility] for acute hypoxemic respiratory failure secondary to COVID-19. Transferred to [facility]. He was treated with remdesivir, solumedrol and barcitinib. He has completed remdesivir. He has been diuresed but no lasix was given in the last 72 hours. He developed worsening oxygen requirement with flow increased from 40L to 60L and then transferred to the ICU on 8/30.   OT comments  Pt presenting with decreased activity tolerance compared to prior session as seen by decreased SpO2 and elevated RR with minimal movement. Pt fatigues quickly Pt performing sit<>stand from recliner and then stand pivot to [facility] with Min A and RW. Pt requiring 5-10 min for lower RR and elevating SpO2 with cues for purse lip breathing. Continue to recommend dc to home with HHOT, however, may need post-acute rehab at dc pending progress. Will continue to follow acutely as admitted.  Rest: HR 110s. RR 20s. SpO2 89-84% on 30L and then 40L heated HFNC with FiO2 at 100% and 15L NRB.  With activity: HR 120s. RR 42-38. SpO2 73-66% on 40L heated HFNC and 15L NRB   Follow Up Recommendations  Home health OT;Supervision/Assistance - 24 hour (May need SNF pending progress)    Equipment Recommendations  3 in 1 bedside commode    Recommendations for Other Services PT consult    Precautions / Restrictions Precautions Precautions: Fall       Mobility Bed Mobility               General bed mobility comments: OOB in the chair on arrival  Transfers Overall transfer level: Needs assistance Equipment used: Rolling walker (2 wheeled) Transfers: Sit to/from [facility] Sit to Stand: Min assist;+2 safety/equipment Stand  pivot transfers: Min assist;+2 safety/equipment       General transfer comment: cues for hand placement, coaching to get pt to a good place breathing-wise, assist to boost.  minor stability assist during pivot, mostly due to lines/tubes, confined space.    Balance Overall balance assessment: Needs assistance Sitting-balance support: No upper extremity supported;Feet supported Sitting balance-Leahy Scale: Fair     Standing balance support: Single extremity supported;Bilateral upper extremity supported Standing balance-Leahy Scale: Poor Standing balance comment: pt reliant on minor use of UE on a stable object.                           ADL either performed or assessed with clinical judgement   ADL Overall ADL's : Needs assistance/impaired                         Toilet Transfer: Minimal assistance;+2 for safety/equipment;Stand-pivot;BSC;RW Toilet Transfer Details (indicate cue type and reason): Min A for balance and cues to pivot to [facility] for BM. Requiring significant rest break after transfer to recover SpO2 and decrease RR         Functional mobility during ADLs: Minimal assistance;+2 for safety/equipment General ADL Comments: Pt performing sit<>stand and then stand pivot to [facility]. Pt demonsatrating decreased actvitiy tolerance and recovery rate compared to prior session. Requiring increased seeated rest breaks for elevating SpO2 and decreasing RR.      Vision       Perception  Praxis      Cognition Arousal/Alertness: Awake/alert Behavior During Therapy: WFL for tasks assessed/performed;Flat affect Overall Cognitive Status: Within Functional Limits for tasks assessed                                          Exercises Exercises: Other exercises;General Lower Extremity General Exercises - Lower Extremity Short Arc Quad: AROM;5 reps;Seated Other Exercises Other Exercises: Purse lip breathing   Shoulder Instructions        General Comments pt on 35L raised to 40L withing session  at 100% FiO2 and NRB at [place] in mid 80's on arrival  pt talkative.  While pulling forward to sit up with episodes of coughing, SpO2 dropped to 68% on 35L leading to changing to 40L  Pt sat slumped forward for ~7 minutes to recover to 89%  RR from lower 30's to 42Max rpm with coughing..  1st standing trial, SpO2 87% down to 74%  with RR 38-41 rpm  with recovery time at 4-5 min.   Very similar vitals resulted wiith transfer to the [facility]  with drop of SpO2 down to upper 60's corresponding jumps in RR.  Coaching continued throughout with pt stuggling to slow RR.    Pertinent Vitals/ Pain       Pain Assessment: No/denies pain  Home Living                                          Prior Functioning/Environment              Frequency  Min 2X/week        Progress Toward Goals  OT Goals(current goals can now be found in the care plan section)  Progress towards OT goals: Progressing toward goals  Acute Rehab OT Goals Patient Stated Goal: better and back home OT Goal Formulation: With patient Time For Goal Achievement: 04/28/20 Potential to Achieve Goals: Good ADL Goals Pt Will Perform Grooming: with modified independence;sitting Pt Will Perform Lower Body Dressing: with min guard assist;sit to/from stand Pt Will Transfer to Toilet: with min guard assist;ambulating;bedside commode Pt Will Perform Toileting - Clothing Manipulation and hygiene: with min guard assist;sit to/from stand;sitting/lateral leans Additional ADL Goal #1: Pt will independently verbalize three energy conservation techniques for ADLs Additional ADL Goal #2: Pt will monitor SpO2 and use purse lip breathing with Min cues during ADLs  Plan Discharge plan remains appropriate    Co-evaluation    PT/OT/SLP Co-Evaluation/Treatment: Yes Reason for Co-Treatment: For patient/therapist safety;To address functional/ADL transfers;Complexity of the  patient's impairments (multi-system involvement) PT goals addressed during session: Mobility/safety with mobility OT goals addressed during session: ADL's and self-care      AM-PAC OT "6 Clicks" Daily Activity     Outcome Measure   Help from another person eating meals?: A Little Help from another person taking care of personal grooming?: A Little Help from another person toileting, which includes using toliet, bedpan, or urinal?: A Little Help from another person bathing (including washing, rinsing, drying)?: A Little Help from another person to put on and taking off regular upper body clothing?: A Little Help from another person to put on and taking off regular lower body clothing?: A Little 6 Click Score: 18    End of Session Equipment Utilized During  Treatment: Oxygen (40L heated HFNC and 15L NRB)  OT Visit Diagnosis: Unsteadiness on feet (R26.81);Other abnormalities of gait and mobility (R26.89);Muscle weakness (generalized) (M62.81)   Activity Tolerance Patient limited by fatigue   Patient Left with nursing/sitter in room [facility])   Nurse Communication Mobility status;Other (comment) (vitals)        Time: 2178-3754 OT Time Calculation (min): 36 min  Charges: OT General Charges $OT Visit: 1 Visit OT Treatments $Self Care/Home Management : 8-22 mins  [place], OTR/L Acute Rehab Pager: [phone] Office: [place] 04/17/2020, 5:48 PM

## 2020-04-17 NOTE — Progress Notes (Signed)
   04/17/20 2100  Assess: MEWS Score  Temp 99 F (37.2 C)  BP 115/79  Pulse Rate (!) 103  ECG Heart Rate (!) 104  Resp (!) 25  SpO2 (!) 88 %  O2 Device HFNC;Non-rebreather Mask  O2 Flow Rate (L/min) 30 L/min  FiO2 (%) 100 %  Assess: MEWS Score  MEWS Temp 0  MEWS Systolic 0  MEWS Pulse 1  MEWS RR 1  MEWS LOC 0  MEWS Score 2  MEWS Score Color Yellow  Assess: if the MEWS score is Yellow or Red  Were vital signs taken at a resting state? Yes  Focused Assessment No change from prior assessment  Early Detection of Sepsis Score *See Row Information* High  MEWS guidelines implemented *See Row Information* Yes  Treat  MEWS Interventions Escalated (See documentation below)  Pain Scale 0-10  Pain Score 0  Take Vital Signs  Increase Vital Sign Frequency  Yellow: Q 2hr X 2 then Q 4hr X 2, if remains yellow, continue Q 4hrs  Escalate  MEWS: Escalate Yellow: discuss with charge nurse/RN and consider discussing with provider and RRT  Notify: Charge Nurse/RN  Name of Charge Nurse/RN Notified Ronalee Belts, RN  Date Charge Nurse/RN Notified 04/17/20  Time Charge Nurse/RN Notified 2110  Document  Progress note created (see row info) Yes

## 2020-04-17 NOTE — Progress Notes (Signed)
Transferred patient from [id number] to [phone]. Called wife, [name], and updated on transfer and contact information. All questions answered at this time.   Antonieta Pert, RN  04/17/20 2105

## 2020-04-17 NOTE — Progress Notes (Signed)
Physical Therapy Treatment Patient Details Name: Alexander Parks MRN: [id number] DOB: [birth year] Today's Date: 04/17/2020    History of Present Illness [name] is a 69 year old male who presented with shortness of breath, cough and positive covid test found hypoxemic. He was admitted [place] for acute hypoxemic respiratory failure secondary to COVID-19. Transferred to [facility]. He was treated with remdesivir, solumedrol and barcitinib. He has completed remdesivir. He has been diuresed but no lasix was given in the last 72 hours. He developed worsening oxygen requirement with flow increased from 40L to 60L and then transferred to the ICU on 8/30.    PT Comments    Pt appeared more talkative on entering the room, but with minimal activity, pts SpO2 dropped significantly with slow recovery, RR went up with pt having difficulty recovering even with calm coaching.  Activities included leaning forward/coming to Edge of the chair, standing then sitting and then standing with transfer to the [facility].  Recovery lasting 4-5 minutes easily.    Follow Up Recommendations  Home health PT;Other (comment) (TBA as his status changes.)     Equipment Recommendations  None recommended by PT    Recommendations for Other Services       Precautions / Restrictions Precautions Precautions: Fall    Mobility  Bed Mobility               General bed mobility comments: OOB in the chair on arrival  Transfers Overall transfer level: Needs assistance Equipment used: Rolling walker (2 wheeled) Transfers: Sit to/from [facility] Sit to Stand: Min assist Stand pivot transfers: Min assist       General transfer comment: cues for hand placement, coaching to get pt to a good place breathing-wise, assist to boost.  minor stability assist during pivot, mostly due to lines/tubes, confined space.  Ambulation/Gait             General Gait Details: pivot only   Stairs              Wheelchair Mobility    Modified Rankin (Stroke Patients Only)       Balance Overall balance assessment: Needs assistance   Sitting balance-Leahy Scale: Fair     Standing balance support: Single extremity supported;Bilateral upper extremity supported Standing balance-Leahy Scale: Poor Standing balance comment: pt reliant on minor use of UE on a stable object.                            Cognition Arousal/Alertness: Awake/alert Behavior During Therapy: WFL for tasks assessed/performed Overall Cognitive Status: Within Functional Limits for tasks assessed                                        Exercises Other Exercises Other Exercises: Coaching for Purse lip breathing    General Comments General comments (skin integrity, edema, etc.): pt on 35L raised to 40L withing session  at 100% FiO2 and NRB at 15L  sats in mid 80's on arrival  pt talkative.  While pulling forward to sit up with episodes of coughing, SpO2 dropped to 68% on 35L leading to changing to 40L  Pt sat slumped forward for ~7 minutes to recover to 89%  RR from lower 30's to 42Max rpm with coughing..  1st standing trial, SpO2 87% down to 74%  with RR 38-41 rpm  with recovery time at 4-5 min.   Very similar vitals resulted wiith transfer to the [facility]  with drop of SpO2 down to upper 60's corresponding jumps in RR.  Coaching continued throughout with pt stuggling to slow RR.      Pertinent Vitals/Pain Pain Assessment: No/denies pain    Home Living                      Prior Function            PT Goals (current goals can now be found in the care plan section) Acute Rehab PT Goals Patient Stated Goal: better and back home PT Goal Formulation: With patient Time For Goal Achievement: 04/28/20 Progress towards PT goals: Not progressing toward goals - comment (limited by cardiopulmonary status)    Frequency    Min 3X/week      PT Plan Current plan remains appropriate     Co-evaluation PT/OT/SLP Co-Evaluation/Treatment: Yes Reason for Co-Treatment: Complexity of the patient's impairments (multi-system involvement) PT goals addressed during session: Mobility/safety with mobility        AM-PAC PT "6 Clicks" Mobility   Outcome Measure  Help needed turning from your back to your side while in a flat bed without using bedrails?: A Little Help needed moving from lying on your back to sitting on the side of a flat bed without using bedrails?: A Little Help needed moving to and from a bed to a chair (including a wheelchair)?: A Little Help needed standing up from a chair using your arms (e.g., wheelchair or bedside chair)?: A Little Help needed to walk in hospital room?: A Lot Help needed climbing 3-5 steps with a railing? : A Lot 6 Click Score: 16    End of Session Equipment Utilized During Treatment: Oxygen Activity Tolerance: Patient limited by fatigue;Other (comment) (cardiopulmonary status) Patient left: Other (comment) (On BSC with nursing to manage peri care and transfer.) Nurse Communication: Mobility status PT Visit Diagnosis: Other abnormalities of gait and mobility (R26.89);Difficulty in walking, not elsewhere classified (R26.2)     Time: 6468-0321 PT Time Calculation (min) (ACUTE ONLY): 36 min  Charges:  $Therapeutic Activity: 8-22 mins                     04/17/2020  Alexander Carne., PT Acute Rehabilitation Services [phone]  (pager) [phone]  (office)   Alexander Parks 04/17/2020, 5:11 PM

## 2020-04-18 ENCOUNTER — Inpatient Hospital Stay (HOSPITAL_COMMUNITY): Payer: Medicare HMO

## 2020-04-18 LAB — CBC WITH DIFFERENTIAL/PLATELET
Abs Immature Granulocytes: 0.25 10*3/uL — ABNORMAL HIGH (ref 0.00–0.07)
Basophils Absolute: 0 10*3/uL (ref 0.0–0.1)
Basophils Relative: 0 %
Eosinophils Absolute: 0 10*3/uL (ref 0.0–0.5)
Eosinophils Relative: 0 %
HCT: 40.4 % (ref 39.0–52.0)
Hemoglobin: 13.1 g/dL (ref 13.0–17.0)
Immature Granulocytes: 1 %
Lymphocytes Relative: 6 %
Lymphs Abs: 1.2 10*3/uL (ref 0.7–4.0)
MCH: 27.3 pg (ref 26.0–34.0)
MCHC: 32.4 g/dL (ref 30.0–36.0)
MCV: 84.2 fL (ref 80.0–100.0)
Monocytes Absolute: 1.1 10*3/uL — ABNORMAL HIGH (ref 0.1–1.0)
Monocytes Relative: 6 %
Neutro Abs: 17.6 10*3/uL — ABNORMAL HIGH (ref 1.7–7.7)
Neutrophils Relative %: 87 %
Platelets: 174 10*3/uL (ref 150–400)
RBC: 4.8 MIL/uL (ref 4.22–5.81)
RDW: 16.7 % — ABNORMAL HIGH (ref 11.5–15.5)
WBC: 20.2 10*3/uL — ABNORMAL HIGH (ref 4.0–10.5)
nRBC: 0 % (ref 0.0–0.2)

## 2020-04-18 LAB — COMPREHENSIVE METABOLIC PANEL
ALT: 77 U/L — ABNORMAL HIGH (ref 0–44)
AST: 57 U/L — ABNORMAL HIGH (ref 15–41)
Albumin: 2.2 g/dL — ABNORMAL LOW (ref 3.5–5.0)
Alkaline Phosphatase: 88 U/L (ref 38–126)
Anion gap: 11 (ref 5–15)
BUN: 57 mg/dL — ABNORMAL HIGH (ref 8–23)
CO2: 25 mmol/L (ref 22–32)
Calcium: 8.8 mg/dL — ABNORMAL LOW (ref 8.9–10.3)
Chloride: 95 mmol/L — ABNORMAL LOW (ref 98–111)
Creatinine, Ser: 1.33 mg/dL — ABNORMAL HIGH (ref 0.61–1.24)
GFR calc Af Amer: >60 mL/min (ref >60)
GFR calc non Af Amer: 54 mL/min — ABNORMAL LOW (ref >60)
Glucose, Bld: 143 mg/dL — ABNORMAL HIGH (ref 70–99)
Potassium: 4.1 mmol/L (ref 3.5–5.1)
Sodium: 131 mmol/L — ABNORMAL LOW (ref 135–145)
Total Bilirubin: 1.9 mg/dL — ABNORMAL HIGH (ref 0.3–1.2)
Total Protein: 6.9 g/dL (ref 6.5–8.1)

## 2020-04-18 LAB — GLUCOSE, CAPILLARY
Glucose-Capillary: 132 mg/dL — ABNORMAL HIGH (ref 70–99)
Glucose-Capillary: 166 mg/dL — ABNORMAL HIGH (ref 70–99)
Glucose-Capillary: 182 mg/dL — ABNORMAL HIGH (ref 70–99)
Glucose-Capillary: 183 mg/dL — ABNORMAL HIGH (ref 70–99)
Glucose-Capillary: 240 mg/dL — ABNORMAL HIGH (ref 70–99)
Glucose-Capillary: 262 mg/dL — ABNORMAL HIGH (ref 70–99)
Glucose-Capillary: 77 mg/dL (ref 70–99)

## 2020-04-18 LAB — BRAIN NATRIURETIC PEPTIDE: B Natriuretic Peptide: 202.2 pg/mL — ABNORMAL HIGH (ref 0.0–100.0)

## 2020-04-18 LAB — MAGNESIUM: Magnesium: 2.3 mg/dL (ref 1.7–2.4)

## 2020-04-18 LAB — C-REACTIVE PROTEIN: CRP: 5 mg/dL — ABNORMAL HIGH (ref <1.0)

## 2020-04-18 LAB — PROCALCITONIN: Procalcitonin: 0.14 ng/mL

## 2020-04-18 MED ORDER — LACTATED RINGERS IV SOLN: INTRAVENOUS | Status: AC

## 2020-04-18 MED ORDER — AMLODIPINE BESYLATE 10 MG PO TABS
10.0000 mg | ORAL_TABLET | Freq: Every day | ORAL | Status: DC
  Administered 2020-04-18 – 2020-04-22 (×5): 10 mg via ORAL
  Filled 2020-04-18 (×5): qty 1

## 2020-04-18 MED ORDER — METHYLPREDNISOLONE SODIUM SUCC 125 MG IJ SOLR
60.0000 mg | Freq: Two times a day (BID) | INTRAMUSCULAR | Status: DC
  Administered 2020-04-18 – 2020-04-21 (×7): 60 mg via INTRAVENOUS
  Filled 2020-04-18 (×7): qty 2

## 2020-04-18 NOTE — Progress Notes (Signed)
PROGRESS NOTE                                                                                                                                                                                                             Patient Demographics:    Alexander Parks, is a 69 y.o. male, DOB - 1951-06-01, NIO:[id number]  Outpatient Primary MD for the patient is Sasser, Silvestre Moment, MD    LOS - 12  Admit date - 04/12/2020    Chief Complaint  Patient presents with  . covid       Brief Narrative - 69 y.o.malewith medical history significantfor coronary artery disease status post CABG x5, cirrhosis of the liver, CHF, diabetes mellitus and nephrolithiasis currently works as a Administrator and not been vaccinated for COVID-19 was admitted to the hospital for severe acute hypoxic respiratory failure due to COVID-19 pneumonia at [facility] and thereafter transferred to [facility] for further care on heated high flow 35 L, continued to get worse despite full medical care, he was transferred to ICU team on 04/14/2020 as he became BiPAP dependent for several days, he has been stabilized somewhat and currently on 30 to 35 L heated high flow oxygen and transferred back to hospitalist service on 04/17/2020.   Subjective:   Patient in bed, appears comfortable, denies any headache, no fever, no chest pain or pressure, +ve shortness of breath , no abdominal pain. No focal weakness.   Assessment  & Plan :    1. Acute Hypoxic Resp. Failure due to Acute Covid 19 Viral Pneumonitis during the ongoing 2020 Covid 19 Pandemic - he is unvaccinated and unfortunately has incurred severe parenchymal lung injury, he has been started on high-dose IV steroids, remdesivir and Barisitinib  combination.  Diurese as needed with IV Lasix, became BiPAP dependent for a few days in ICU, now slightly better on 45 L heated high flow + NRB , still having some increased work of breathing  and will need close monitoring, steroids being tapered gently.  Continue to monitor closely.  Still overall extremely tenuous.  Encouraged the patient to sit up in chair in the daytime use I-S and flutter valve for pulmonary toiletry and then prone in bed when at night.  Will advance activity and titrate down oxygen as possible.    SpO2: (!) 84 % O2  Flow Rate (L/min): 45 L/min FiO2 (%): 100 %  Recent Labs  Lab 04/13/20 0220 04/13/20 0806 04/14/20 0631 04/15/20 0314 04/16/20 0448 04/17/20 0322 04/18/20 0508  WBC 9.9  --  14.0* 15.6* 18.4* 20.7* 20.2*  PLT 108*  --  142* 143* 149* 158 174  CRP 6.6*  --  3.8* 3.1* 3.3* 4.6* 5.0*  BNP  --    < > 108.8* 141.5* 160.0* 307.2* 202.2*  DDIMER 1.15*  --  1.29* 1.85* 1.84* 1.70*  --   PROCALCITON <0.10  --  0.11 <0.10 <0.10 <0.10 0.14  AST 57*  --  49* 66* 56* 57* 57*  ALT 68*  --  71* 89* 84* 87* 77*  ALKPHOS 75  --  79 95 97 107 88  BILITOT 2.0*  --  2.3* 2.1* 2.1* 2.2* 1.9*  ALBUMIN 2.4*  --  2.6* 2.5* 2.3* 2.4* 2.2*   < > = values in this interval not displayed.    2. CAD post CABG - no acute issues chest pain-free on aspirin, Coreg and statin for secondary prevention.  3.  Chronic diastolic CHF EF 01%.  As needed Lasix.  4.  Dyslipidemia.  On statin.  5.  GERD.  On PPI.  6.  Cryptogenic cirrhosis recently diagnosed through biopsy .  Follow with Dr. Laural Golden in [place].    7. DM type II.  Currently on steroids.  Have adjusted his Levemir and sliding scale dose will monitor and adjust as needed.    Lab Results  Component Value Date   HGBA1C 7.9 (H) 04/04/2020   CBG (last 3)  Recent Labs    04/17/20 2359 04/18/20 0310 04/18/20 0753  GLUCAP 77 183* 166*      Condition - Extremely Guarded  Family Communication  :  Wife [phone] on 04/13/20, 04/17/20  Code Status :  Full  Consults  : PCCM  Procedures  :  None  PUD Prophylaxis : PPI  Disposition Plan  :    Status is: Inpatient  Remains inpatient  appropriate because:IV treatments appropriate due to intensity of illness or inability to take PO   Dispo:  Patient From: Home  Planned Disposition: To be determined  Expected discharge date: 04/11/20  Medically stable for discharge: No   DVT Prophylaxis  :  Lovenox   Lab Results  Component Value Date   PLT 174 04/18/2020    Diet :  Diet Order            Diet heart healthy/carb modified Room service appropriate? Yes; Fluid consistency: Thin  Diet effective now                  Inpatient Medications  Scheduled Meds: . amLODipine  10 mg Oral Daily  . vitamin C  500 mg Oral Daily  . aspirin EC  81 mg Oral Daily  . atorvastatin  80 mg Oral q1800  . baricitinib  4 mg Oral Daily  . carvedilol  3.125 mg Oral BID WC  . chlorhexidine  15 mL Mouth Rinse BID  . Chlorhexidine Gluconate Cloth  6 each Topical Daily  . enoxaparin (LOVENOX) injection  50 mg Subcutaneous Q24H  . feeding supplement (GLUCERNA SHAKE)  237 mL Oral TID BM  . insulin aspart  0-20 Units Subcutaneous Q4H  . insulin aspart  6 Units Subcutaneous TID WC  . insulin detemir  25 Units Subcutaneous BID  . Ipratropium-Albuterol  1 puff Inhalation Q6H  . mouth rinse  15 mL Mouth Rinse  q12n4p  . methylPREDNISolone (SOLU-MEDROL) injection  60 mg Intravenous Q12H  . pantoprazole  40 mg Oral Daily  . zinc sulfate  220 mg Oral Daily   Continuous Infusions: . lactated ringers     PRN Meds:.acetaminophen, bisacodyl, chlorpheniramine-HYDROcodone, docusate sodium, [DISCONTINUED] ondansetron **OR** ondansetron (ZOFRAN) IV, phenol  Antibiotics  :    Anti-infectives (From admission, onward)   Start     Dose/Rate Route Frequency Ordered Stop   04/10/20 1315  remdesivir 100 mg in sodium chloride 0.9 % 100 mL IVPB        100 mg 200 mL/hr over 30 Minutes Intravenous  Once 04/10/20 1301 04/10/20 1355   04/07/20 1000  remdesivir 100 mg in sodium chloride 0.9 % 100 mL IVPB  Status:  Discontinued       "Followed by" Linked  Group Details   100 mg 200 mL/hr over 30 Minutes Intravenous Daily 03/27/2020 1348 04/10/20 1301   04/05/2020 1400  remdesivir 100 mg in sodium chloride 0.9 % 100 mL IVPB       "Followed by" Linked Group Details   100 mg 200 mL/hr over 30 Minutes Intravenous Every 1 hr x 2 03/26/2020 1348 03/20/2020 1559       Time Spent in minutes  30   Lala Lund M.D on 04/18/2020 at 10:05 AM  To page go to [email or web address] - password [email or web address]  [facility] -  Office  [phone]    See all Orders from today for further details    Objective:   Vitals:   04/18/20 0312 04/18/20 0400 04/18/20 0700 04/18/20 0820  BP: (!) 147/87 133/81  (!) 151/74  Pulse: 88 88  (!) 103  Resp: 20 (!) 25    Temp: 98.2 F (36.8 C) 98.2 F (36.8 C)    TempSrc: Axillary Axillary    SpO2: 95% 96% (!) 84%   Weight:      Height:        Wt Readings from Last 3 Encounters:  04/10/20 95.9 kg  03/26/20 102.5 kg  02/21/20 103 kg     Intake/Output Summary (Last 24 hours) at 04/18/2020 1005 Last data filed at 04/18/2020 0643 Gross per 24 hour  Intake 240 ml  Output 1350 ml  Net -1110 ml     Physical Exam  Awake Alert, No new F.N deficits, Normal affect [place].AT,PERRAL Supple Neck,No JVD, No cervical lymphadenopathy appriciated.  Symmetrical Chest wall movement, Good air movement bilaterally, fine rales RRR,No Gallops, Rubs or new Murmurs, No Parasternal Heave +ve B.Sounds, Abd Soft, No tenderness, No organomegaly appriciated, No rebound - guarding or rigidity. No Cyanosis, Clubbing or edema, No new Rash or bruise    Data Review:    CBC Recent Labs  Lab 04/14/20 0631 04/15/20 0314 04/16/20 0448 04/17/20 0322 04/18/20 0508  WBC 14.0* 15.6* 18.4* 20.7* 20.2*  HGB 13.6 13.6 13.5 13.3 13.1  HCT 40.8 40.9 40.4 40.5 40.4  PLT 142* 143* 149* 158 174  MCV 84.1 83.5 83.5 83.3 84.2  MCH 28.0 27.8 27.9 27.4 27.3  MCHC 33.3 33.3 33.4 32.8 32.4  RDW 16.5* 16.6* 16.4* 16.6* 16.7*  LYMPHSABS  --   --   --   --   1.2  MONOABS  --   --   --   --  1.1*  EOSABS  --   --   --   --  0.0  BASOSABS  --   --   --   --  0.0    Recent Labs  Lab 04/13/20 0220 04/13/20 0800 04/14/20 0631 04/14/20 0631 04/14/20 2159 04/15/20 0314 04/16/20 0448 04/17/20 0322 04/18/20 0508  NA 135  --  140   < > 137 136 137 134* 131*  K 4.5  --  3.8   < > 4.2 4.1 4.1 4.3 4.1  CL 103  --  101   < > 103 102 101 99 95*  CO2 21*  --  27   < > 21* 22 25 26 25   GLUCOSE 245*  --  109*   < > 180* 91 142* 167* 143*  BUN 39*  --  45*   < > 44* 44* 47* 47* 57*  CREATININE 1.10  --  1.29*   < > 1.10 0.93 1.12 1.02 1.33*  CALCIUM 8.6*  --  9.1   < > 9.1 9.3 9.5 9.0 8.8*  AST 57*  --  49*  --   --  66* 56* 57* 57*  ALT 68*  --  71*  --   --  89* 84* 87* 77*  ALKPHOS 75  --  79  --   --  95 97 107 88  BILITOT 2.0*  --  2.3*  --   --  2.1* 2.1* 2.2* 1.9*  ALBUMIN 2.4*  --  2.6*  --   --  2.5* 2.3* 2.4* 2.2*  MG  --    < > 2.2   < > 2.1 2.2 2.2 2.1 2.3  CRP 6.6*  --  3.8*  --   --  3.1* 3.3* 4.6* 5.0*  DDIMER 1.15*  --  1.29*  --   --  1.85* 1.84* 1.70*  --   PROCALCITON <0.10  --  0.11  --   --  <0.10 <0.10 <0.10 0.14  BNP  --    < > 108.8*  --   --  141.5* 160.0* 307.2* 202.2*   < > = values in this interval not displayed.    ------------------------------------------------------------------------------------------------------------------ No results for input(s): CHOL, HDL, LDLCALC, TRIG, CHOLHDL, LDLDIRECT in the last 72 hours.  Lab Results  Component Value Date   HGBA1C 7.9 (H) 04/05/2020   ------------------------------------------------------------------------------------------------------------------ No results for input(s): TSH, T4TOTAL, T3FREE, THYROIDAB in the last 72 hours.  Invalid input(s): FREET3  Cardiac Enzymes No results for input(s): CKMB, TROPONINI, MYOGLOBIN in the last 168 hours.  Invalid input(s):  CK ------------------------------------------------------------------------------------------------------------------    Component Value Date/Time   BNP 202.2 (H) 04/18/2020 9528    Micro Results No results found for this or any previous visit (from the past 240 hour(s)).  Radiology Reports DG Chest Port 1 View  Result Date: 04/18/2020 CLINICAL DATA:  COVID. EXAM: PORTABLE CHEST 1 VIEW COMPARISON:  04/13/2020 FINDINGS: Bilateral indistinct pulmonary opacities with mild progression from before. Borderline heart size. Stable aortic tortuosity. There has been CABG. No visible effusion or pneumothorax. IMPRESSION: Mild progression of bilateral pneumonia. Electronically Signed   By: Monte Fantasia M.D.   On: 04/18/2020 09:05   DG Chest Port 1 View  Result Date: 04/13/2020 CLINICAL DATA:  Shortness of breath, COVID-19 positive EXAM: PORTABLE CHEST 1 VIEW COMPARISON:  04/10/2020 FINDINGS: Post CABG changes. Stable cardiomediastinal contours. Atherosclerotic calcification of the aortic knob. Patchy opacities within the periphery of the left mid to lower lung fields, slightly progressed from prior. Streaky right basilar interstitial opacity. No large pleural fluid collection. No pneumothorax. IMPRESSION: Slight progression of airspace opacities, most pronounced in the periphery of the left lung. Findings favored to represent atypical/viral infection in  the setting of known COVID 19. Electronically Signed   By: Davina Poke D.O.   On: 04/13/2020 08:26   DG Chest Port 1 View  Result Date: 04/10/2020 CLINICAL DATA:  Chest pain, shortness of breath and low oxygen saturation, recent diagnosis of COVID-19, diabetes mellitus, former smoker, cirrhosis, coronary artery disease post CABG EXAM: PORTABLE CHEST 1 VIEW COMPARISON:  Portable exam 1057 hours compared to 04/04/2020 FINDINGS: Upper normal heart size post CABG. Tortuous aorta with minimal atherosclerotic calcification. Pulmonary vascularity normal.  Interstitial infiltrates LEFT lung question atypical infection. No pleural effusion or pneumothorax. Osseous structures unremarkable. IMPRESSION: Infiltrates in mid to inferior LEFT lung question atypical infection. Electronically Signed   By: Lavonia Dana M.D.   On: 04/10/2020 11:23   DG Chest Port 1 View  Result Date: 04/05/2020 CLINICAL DATA:  69 year old male positive COVID-19.  Cough. EXAM: PORTABLE CHEST 1 VIEW COMPARISON:  02/21/2020 chest radiographs and earlier. FINDINGS: AP view at 1137 hours. Increased lordotic positioning. Possibly lower lung volumes. Stable cardiac size and mediastinal contours. Prior CABG. Chronic increased interstitial markings in both lungs, with mild new patchy and vague opacity along the right minor fissure and possibly also at the left lung base. No superimposed pneumothorax or pleural effusion. Visualized tracheal air column is within normal limits. No acute osseous abnormality identified. Paucity of bowel gas in the upper abdomen. IMPRESSION: Chronic pulmonary interstitial changes suspected with evidence of mild superimposed COVID-19 pneumonia. Electronically Signed   By: Genevie Ann M.D.   On: 04/02/2020 11:51

## 2020-04-18 NOTE — Progress Notes (Signed)
   04/18/20 1846  Assess: MEWS Score  Temp 98.6 F (37 C)  BP (!) 143/79  Pulse Rate (!) 116  ECG Heart Rate (!) 116  Assess: MEWS Score  MEWS Temp 0  MEWS Systolic 0  MEWS Pulse 2  MEWS RR 1  MEWS LOC 0  MEWS Score 3  MEWS Score Color Yellow  Document  Patient Outcome Other (Comment) (Pt has been medicated for BP)

## 2020-04-19 LAB — CBC WITH DIFFERENTIAL/PLATELET
Abs Immature Granulocytes: 0.17 10*3/uL — ABNORMAL HIGH (ref 0.00–0.07)
Basophils Absolute: 0 10*3/uL (ref 0.0–0.1)
Basophils Relative: 0 %
Eosinophils Absolute: 0 10*3/uL (ref 0.0–0.5)
Eosinophils Relative: 0 %
HCT: 37.1 % — ABNORMAL LOW (ref 39.0–52.0)
Hemoglobin: 12.2 g/dL — ABNORMAL LOW (ref 13.0–17.0)
Immature Granulocytes: 1 %
Lymphocytes Relative: 4 %
Lymphs Abs: 0.7 10*3/uL (ref 0.7–4.0)
MCH: 27.5 pg (ref 26.0–34.0)
MCHC: 32.9 g/dL (ref 30.0–36.0)
MCV: 83.6 fL (ref 80.0–100.0)
Monocytes Absolute: 0.6 10*3/uL (ref 0.1–1.0)
Monocytes Relative: 4 %
Neutro Abs: 15.7 10*3/uL — ABNORMAL HIGH (ref 1.7–7.7)
Neutrophils Relative %: 91 %
Platelets: 143 10*3/uL — ABNORMAL LOW (ref 150–400)
RBC: 4.44 MIL/uL (ref 4.22–5.81)
RDW: 16.4 % — ABNORMAL HIGH (ref 11.5–15.5)
WBC: 17.3 10*3/uL — ABNORMAL HIGH (ref 4.0–10.5)
nRBC: 0 % (ref 0.0–0.2)

## 2020-04-19 LAB — COMPREHENSIVE METABOLIC PANEL
ALT: 79 U/L — ABNORMAL HIGH (ref 0–44)
AST: 64 U/L — ABNORMAL HIGH (ref 15–41)
Albumin: 2 g/dL — ABNORMAL LOW (ref 3.5–5.0)
Alkaline Phosphatase: 85 U/L (ref 38–126)
Anion gap: 8 (ref 5–15)
BUN: 55 mg/dL — ABNORMAL HIGH (ref 8–23)
CO2: 26 mmol/L (ref 22–32)
Calcium: 8.8 mg/dL — ABNORMAL LOW (ref 8.9–10.3)
Chloride: 99 mmol/L (ref 98–111)
Creatinine, Ser: 1.06 mg/dL (ref 0.61–1.24)
GFR calc Af Amer: >60 mL/min (ref >60)
GFR calc non Af Amer: >60 mL/min (ref >60)
Glucose, Bld: 146 mg/dL — ABNORMAL HIGH (ref 70–99)
Potassium: 4.7 mmol/L (ref 3.5–5.1)
Sodium: 133 mmol/L — ABNORMAL LOW (ref 135–145)
Total Bilirubin: 1.8 mg/dL — ABNORMAL HIGH (ref 0.3–1.2)
Total Protein: 6.5 g/dL (ref 6.5–8.1)

## 2020-04-19 LAB — GLUCOSE, CAPILLARY
Glucose-Capillary: 142 mg/dL — ABNORMAL HIGH (ref 70–99)
Glucose-Capillary: 113 mg/dL — ABNORMAL HIGH (ref 70–99)
Glucose-Capillary: 258 mg/dL — ABNORMAL HIGH (ref 70–99)
Glucose-Capillary: 264 mg/dL — ABNORMAL HIGH (ref 70–99)
Glucose-Capillary: 258 mg/dL — ABNORMAL HIGH (ref 70–99)

## 2020-04-19 LAB — BRAIN NATRIURETIC PEPTIDE: B Natriuretic Peptide: 176.3 pg/mL — ABNORMAL HIGH (ref 0.0–100.0)

## 2020-04-19 LAB — PROCALCITONIN: Procalcitonin: 0.11 ng/mL

## 2020-04-19 LAB — C-REACTIVE PROTEIN: CRP: 8.5 mg/dL — ABNORMAL HIGH (ref <1.0)

## 2020-04-19 LAB — MAGNESIUM: Magnesium: 2.3 mg/dL (ref 1.7–2.4)

## 2020-04-19 NOTE — Progress Notes (Signed)
PROGRESS NOTE                                                                                                                                                                                                             Patient Demographics:    Alexander Parks, is a 69 y.o. male, DOB - 29-Jul-1951, SWN:[id number]  Outpatient Primary MD for the patient is Sasser, Silvestre Moment, MD    LOS - 13  Admit date - 03/31/2020    Chief Complaint  Patient presents with  . covid       Brief Narrative - 69 y.o.malewith medical history significantfor coronary artery disease status post CABG x5, cirrhosis of the liver, CHF, diabetes mellitus and nephrolithiasis currently works as a Administrator and not been vaccinated for COVID-19 was admitted to the hospital for severe acute hypoxic respiratory failure due to COVID-19 pneumonia at [facility] and thereafter transferred to [facility] for further care on heated high flow 35 L, continued to get worse despite full medical care, he was transferred to ICU team on 04/14/2020 as he became BiPAP dependent for several days, he has been stabilized somewhat and currently on 30 to 35 L heated high flow oxygen and transferred back to hospitalist service on 04/17/2020.   Subjective:   Patient in bed, appears comfortable, denies any headache, no fever, no chest pain or pressure, ++ shortness of breath , no abdominal pain. No focal weakness.   Assessment  & Plan :    1. Acute Hypoxic Resp. Failure due to Acute Covid 19 Viral Pneumonitis during the ongoing 2020 Covid 19 Pandemic - he is unvaccinated and unfortunately has incurred severe parenchymal lung injury, he has been started on high-dose IV steroids, remdesivir and Barisitinib  combination.  Diurese as needed with IV Lasix, became BiPAP dependent for a few days in ICU, now slightly better on 45 L heated high flow + NRB , still having some increased work of breathing  and will need close monitoring, steroids being tapered gently, 1 dose IV lasix on 04/19/20, continue to monitor closely.  Still overall extremely tenuous.  Encouraged the patient to sit up in chair in the daytime use I-S and flutter valve for pulmonary toiletry and then prone in bed when at night.  Will advance activity and titrate down oxygen as possible.  SpO2: (!) 80 % O2 Flow Rate (L/min): 45 L/min FiO2 (%): 100 %  Recent Labs  Lab 04/13/20 0220 04/13/20 0806 04/14/20 0631 04/14/20 0631 04/15/20 0314 04/16/20 0448 04/17/20 0322 04/18/20 0508 04/19/20 0238  WBC 9.9  --  14.0*   < > 15.6* 18.4* 20.7* 20.2* 17.3*  PLT 108*  --  142*   < > 143* 149* 158 174 143*  CRP 6.6*  --  3.8*   < > 3.1* 3.3* 4.6* 5.0* 8.5*  BNP  --    < > 108.8*   < > 141.5* 160.0* 307.2* 202.2* 176.3*  DDIMER 1.15*  --  1.29*  --  1.85* 1.84* 1.70*  --   --   PROCALCITON <0.10  --  0.11  --  <0.10 <0.10 <0.10 0.14  --   AST 57*  --  49*   < > 66* 56* 57* 57* 64*  ALT 68*  --  71*   < > 89* 84* 87* 77* 79*  ALKPHOS 75  --  79   < > 95 97 107 88 85  BILITOT 2.0*  --  2.3*   < > 2.1* 2.1* 2.2* 1.9* 1.8*  ALBUMIN 2.4*  --  2.6*   < > 2.5* 2.3* 2.4* 2.2* 2.0*   < > = values in this interval not displayed.    2. CAD post CABG - no acute issues chest pain-free on aspirin, Coreg and statin for secondary prevention.  3.  Chronic diastolic CHF EF 74%.  As needed Lasix.  4.  Dyslipidemia.  On statin.  5.  GERD.  On PPI.  6.  Cryptogenic cirrhosis recently diagnosed through biopsy .  Follow with Dr. Laural Golden in [place].    7. DM type II.  Currently on steroids.  Have adjusted his Levemir and sliding scale dose will monitor and adjust as needed.    Lab Results  Component Value Date   HGBA1C 7.9 (H) 03/24/2020   CBG (last 3)  Recent Labs    04/18/20 2357 04/19/20 0347 04/19/20 0727  GLUCAP 132* 142* 113*      Condition - Extremely Guarded  Family Communication  :  Wife [phone] on  04/13/20, 04/17/20  Code Status :  Full  Consults  : PCCM  Procedures  :  None  PUD Prophylaxis : PPI  Disposition Plan  :    Status is: Inpatient  Remains inpatient appropriate because:IV treatments appropriate due to intensity of illness or inability to take PO   Dispo:  Patient From: Home  Planned Disposition: To be determined  Expected discharge date: 04/11/20  Medically stable for discharge: No   DVT Prophylaxis  :  Lovenox   Lab Results  Component Value Date   PLT 143 (L) 04/19/2020    Diet :  Diet Order            Diet heart healthy/carb modified Room service appropriate? Yes; Fluid consistency: Thin  Diet effective now                  Inpatient Medications  Scheduled Meds: . amLODipine  10 mg Oral Daily  . vitamin C  500 mg Oral Daily  . aspirin EC  81 mg Oral Daily  . atorvastatin  80 mg Oral q1800  . baricitinib  4 mg Oral Daily  . carvedilol  3.125 mg Oral BID WC  . chlorhexidine  15 mL Mouth Rinse BID  . Chlorhexidine Gluconate Cloth  6 each Topical Daily  .  enoxaparin (LOVENOX) injection  50 mg Subcutaneous Q24H  . feeding supplement (GLUCERNA SHAKE)  237 mL Oral TID BM  . insulin aspart  0-20 Units Subcutaneous Q4H  . insulin aspart  6 Units Subcutaneous TID WC  . insulin detemir  25 Units Subcutaneous BID  . Ipratropium-Albuterol  1 puff Inhalation Q6H  . mouth rinse  15 mL Mouth Rinse q12n4p  . methylPREDNISolone (SOLU-MEDROL) injection  60 mg Intravenous Q12H  . pantoprazole  40 mg Oral Daily  . zinc sulfate  220 mg Oral Daily   Continuous Infusions:  PRN Meds:.acetaminophen, bisacodyl, chlorpheniramine-HYDROcodone, docusate sodium, [DISCONTINUED] ondansetron **OR** ondansetron (ZOFRAN) IV, phenol  Antibiotics  :    Anti-infectives (From admission, onward)   Start     Dose/Rate Route Frequency Ordered Stop   04/10/20 1315  remdesivir 100 mg in sodium chloride 0.9 % 100 mL IVPB        100 mg 200 mL/hr over 30 Minutes Intravenous   Once 04/10/20 1301 04/10/20 1355   04/07/20 1000  remdesivir 100 mg in sodium chloride 0.9 % 100 mL IVPB  Status:  Discontinued       "Followed by" Linked Group Details   100 mg 200 mL/hr over 30 Minutes Intravenous Daily 03/15/2020 1348 04/10/20 1301   03/19/2020 1400  remdesivir 100 mg in sodium chloride 0.9 % 100 mL IVPB       "Followed by" Linked Group Details   100 mg 200 mL/hr over 30 Minutes Intravenous Every 1 hr x 2 03/29/2020 1348 03/19/2020 1559       Time Spent in minutes  30   Lala Lund M.D on 04/19/2020 at 10:39 AM  To page go to [email or web address] - password [facility]  [facility] -  Office  [phone]    See all Orders from today for further details    Objective:   Vitals:   04/19/20 0721 04/19/20 0816 04/19/20 0825 04/19/20 0900  BP: 133/81 137/79    Pulse: 85 85 89   Resp: 19  (!) 21   Temp: (!) 97.3 F (36.3 C)     TempSrc: Axillary     SpO2:   93% (!) 80%  Weight:      Height:        Wt Readings from Last 3 Encounters:  04/10/20 95.9 kg  03/26/20 102.5 kg  02/21/20 103 kg     Intake/Output Summary (Last 24 hours) at 04/19/2020 1039 Last data filed at 04/19/2020 0930 Gross per 24 hour  Intake 740 ml  Output 400 ml  Net 340 ml     Physical Exam  Awake Alert, No new F.N deficits, Normal affect [place].AT,PERRAL Supple Neck,No JVD, No cervical lymphadenopathy appriciated.  Symmetrical Chest wall movement, Good air movement bilaterally, few rales RRR,No Gallops, Rubs or new Murmurs, No Parasternal Heave +ve B.Sounds, Abd Soft, No tenderness, No organomegaly appriciated, No rebound - guarding or rigidity. No Cyanosis, Clubbing or edema, No new Rash or bruise    Data Review:    CBC Recent Labs  Lab 04/15/20 0314 04/16/20 0448 04/17/20 0322 04/18/20 0508 04/19/20 0238  WBC 15.6* 18.4* 20.7* 20.2* 17.3*  HGB 13.6 13.5 13.3 13.1 12.2*  HCT 40.9 40.4 40.5 40.4 37.1*  PLT 143* 149* 158 174 143*  MCV 83.5 83.5 83.3 84.2 83.6  MCH 27.8 27.9  27.4 27.3 27.5  MCHC 33.3 33.4 32.8 32.4 32.9  RDW 16.6* 16.4* 16.6* 16.7* 16.4*  LYMPHSABS  --   --   --  1.2  0.7  MONOABS  --   --   --  1.1* 0.6  EOSABS  --   --   --  0.0 0.0  BASOSABS  --   --   --  0.0 0.0    Recent Labs  Lab 04/13/20 0220 04/13/20 0800 04/14/20 0631 04/14/20 2159 04/15/20 0314 04/16/20 0448 04/17/20 0322 04/18/20 0508 04/19/20 0238  NA 135  --  140   < > 136 137 134* 131* 133*  K 4.5  --  3.8   < > 4.1 4.1 4.3 4.1 4.7  CL 103  --  101   < > 102 101 99 95* 99  CO2 21*  --  27   < > 22 25 26 25 26   GLUCOSE 245*  --  109*   < > 91 142* 167* 143* 146*  BUN 39*  --  45*   < > 44* 47* 47* 57* 55*  CREATININE 1.10  --  1.29*   < > 0.93 1.12 1.02 1.33* 1.06  CALCIUM 8.6*  --  9.1   < > 9.3 9.5 9.0 8.8* 8.8*  AST 57*  --  49*  --  66* 56* 57* 57* 64*  ALT 68*  --  71*  --  89* 84* 87* 77* 79*  ALKPHOS 75  --  79  --  95 97 107 88 85  BILITOT 2.0*  --  2.3*  --  2.1* 2.1* 2.2* 1.9* 1.8*  ALBUMIN 2.4*  --  2.6*  --  2.5* 2.3* 2.4* 2.2* 2.0*  MG  --    < > 2.2   < > 2.2 2.2 2.1 2.3 2.3  CRP 6.6*  --  3.8*  --  3.1* 3.3* 4.6* 5.0* 8.5*  DDIMER 1.15*  --  1.29*  --  1.85* 1.84* 1.70*  --   --   PROCALCITON <0.10  --  0.11  --  <0.10 <0.10 <0.10 0.14  --   BNP  --    < > 108.8*  --  141.5* 160.0* 307.2* 202.2* 176.3*   < > = values in this interval not displayed.    ------------------------------------------------------------------------------------------------------------------ No results for input(s): CHOL, HDL, LDLCALC, TRIG, CHOLHDL, LDLDIRECT in the last 72 hours.  Lab Results  Component Value Date   HGBA1C 7.9 (H) 03/30/2020   ------------------------------------------------------------------------------------------------------------------ No results for input(s): TSH, T4TOTAL, T3FREE, THYROIDAB in the last 72 hours.  Invalid input(s): FREET3  Cardiac Enzymes No results for input(s): CKMB, TROPONINI, MYOGLOBIN in the last 168 hours.  Invalid  input(s): CK ------------------------------------------------------------------------------------------------------------------    Component Value Date/Time   BNP 176.3 (H) 04/19/2020 6283    Micro Results No results found for this or any previous visit (from the past 240 hour(s)).  Radiology Reports DG Chest Port 1 View  Result Date: 04/18/2020 CLINICAL DATA:  COVID. EXAM: PORTABLE CHEST 1 VIEW COMPARISON:  04/13/2020 FINDINGS: Bilateral indistinct pulmonary opacities with mild progression from before. Borderline heart size. Stable aortic tortuosity. There has been CABG. No visible effusion or pneumothorax. IMPRESSION: Mild progression of bilateral pneumonia. Electronically Signed   By: Monte Fantasia M.D.   On: 04/18/2020 09:05   DG Chest Port 1 View  Result Date: 04/13/2020 CLINICAL DATA:  Shortness of breath, COVID-19 positive EXAM: PORTABLE CHEST 1 VIEW COMPARISON:  04/10/2020 FINDINGS: Post CABG changes. Stable cardiomediastinal contours. Atherosclerotic calcification of the aortic knob. Patchy opacities within the periphery of the left mid to lower lung fields, slightly progressed from prior. Streaky right  basilar interstitial opacity. No large pleural fluid collection. No pneumothorax. IMPRESSION: Slight progression of airspace opacities, most pronounced in the periphery of the left lung. Findings favored to represent atypical/viral infection in the setting of known COVID 19. Electronically Signed   By: Davina Poke D.O.   On: 04/13/2020 08:26   DG Chest Port 1 View  Result Date: 04/10/2020 CLINICAL DATA:  Chest pain, shortness of breath and low oxygen saturation, recent diagnosis of COVID-19, diabetes mellitus, former smoker, cirrhosis, coronary artery disease post CABG EXAM: PORTABLE CHEST 1 VIEW COMPARISON:  Portable exam 1057 hours compared to 03/30/2020 FINDINGS: Upper normal heart size post CABG. Tortuous aorta with minimal atherosclerotic calcification. Pulmonary vascularity  normal. Interstitial infiltrates LEFT lung question atypical infection. No pleural effusion or pneumothorax. Osseous structures unremarkable. IMPRESSION: Infiltrates in mid to inferior LEFT lung question atypical infection. Electronically Signed   By: Lavonia Dana M.D.   On: 04/10/2020 11:23   DG Chest Port 1 View  Result Date: 03/28/2020 CLINICAL DATA:  69 year old male positive COVID-19.  Cough. EXAM: PORTABLE CHEST 1 VIEW COMPARISON:  02/21/2020 chest radiographs and earlier. FINDINGS: AP view at 1137 hours. Increased lordotic positioning. Possibly lower lung volumes. Stable cardiac size and mediastinal contours. Prior CABG. Chronic increased interstitial markings in both lungs, with mild new patchy and vague opacity along the right minor fissure and possibly also at the left lung base. No superimposed pneumothorax or pleural effusion. Visualized tracheal air column is within normal limits. No acute osseous abnormality identified. Paucity of bowel gas in the upper abdomen. IMPRESSION: Chronic pulmonary interstitial changes suspected with evidence of mild superimposed COVID-19 pneumonia. Electronically Signed   By: Genevie Ann M.D.   On: 03/27/2020 11:51

## 2020-04-20 ENCOUNTER — Inpatient Hospital Stay (HOSPITAL_COMMUNITY): Payer: Medicare HMO

## 2020-04-20 LAB — COMPREHENSIVE METABOLIC PANEL
ALT: 82 U/L — ABNORMAL HIGH (ref 0–44)
AST: 59 U/L — ABNORMAL HIGH (ref 15–41)
Albumin: 2 g/dL — ABNORMAL LOW (ref 3.5–5.0)
Alkaline Phosphatase: 103 U/L (ref 38–126)
Anion gap: 11 (ref 5–15)
BUN: 75 mg/dL — ABNORMAL HIGH (ref 8–23)
CO2: 24 mmol/L (ref 22–32)
Calcium: 8.9 mg/dL (ref 8.9–10.3)
Chloride: 98 mmol/L (ref 98–111)
Creatinine, Ser: 1.21 mg/dL (ref 0.61–1.24)
GFR calc Af Amer: >60 mL/min (ref >60)
GFR calc non Af Amer: >60 mL/min (ref >60)
Glucose, Bld: 197 mg/dL — ABNORMAL HIGH (ref 70–99)
Potassium: 4.3 mmol/L (ref 3.5–5.1)
Sodium: 133 mmol/L — ABNORMAL LOW (ref 135–145)
Total Bilirubin: 1.2 mg/dL (ref 0.3–1.2)
Total Protein: 6.5 g/dL (ref 6.5–8.1)

## 2020-04-20 LAB — CBC WITH DIFFERENTIAL/PLATELET
Abs Immature Granulocytes: 0.15 10*3/uL — ABNORMAL HIGH (ref 0.00–0.07)
Basophils Absolute: 0 10*3/uL (ref 0.0–0.1)
Basophils Relative: 0 %
Eosinophils Absolute: 0 10*3/uL (ref 0.0–0.5)
Eosinophils Relative: 0 %
HCT: 38.1 % — ABNORMAL LOW (ref 39.0–52.0)
Hemoglobin: 12.6 g/dL — ABNORMAL LOW (ref 13.0–17.0)
Immature Granulocytes: 1 %
Lymphocytes Relative: 4 %
Lymphs Abs: 0.7 10*3/uL (ref 0.7–4.0)
MCH: 27.8 pg (ref 26.0–34.0)
MCHC: 33.1 g/dL (ref 30.0–36.0)
MCV: 84.1 fL (ref 80.0–100.0)
Monocytes Absolute: 1 10*3/uL (ref 0.1–1.0)
Monocytes Relative: 6 %
Neutro Abs: 14.7 10*3/uL — ABNORMAL HIGH (ref 1.7–7.7)
Neutrophils Relative %: 89 %
Platelets: 157 10*3/uL (ref 150–400)
RBC: 4.53 MIL/uL (ref 4.22–5.81)
RDW: 16.5 % — ABNORMAL HIGH (ref 11.5–15.5)
WBC: 16.6 10*3/uL — ABNORMAL HIGH (ref 4.0–10.5)
nRBC: 0 % (ref 0.0–0.2)

## 2020-04-20 LAB — GLUCOSE, CAPILLARY
Glucose-Capillary: 104 mg/dL — ABNORMAL HIGH (ref 70–99)
Glucose-Capillary: 138 mg/dL — ABNORMAL HIGH (ref 70–99)
Glucose-Capillary: 149 mg/dL — ABNORMAL HIGH (ref 70–99)
Glucose-Capillary: 193 mg/dL — ABNORMAL HIGH (ref 70–99)
Glucose-Capillary: 241 mg/dL — ABNORMAL HIGH (ref 70–99)
Glucose-Capillary: 265 mg/dL — ABNORMAL HIGH (ref 70–99)

## 2020-04-20 LAB — C-REACTIVE PROTEIN: CRP: 5.5 mg/dL — ABNORMAL HIGH (ref <1.0)

## 2020-04-20 LAB — PROCALCITONIN: Procalcitonin: <0.1 ng/mL

## 2020-04-20 LAB — MAGNESIUM: Magnesium: 2.5 mg/dL — ABNORMAL HIGH (ref 1.7–2.4)

## 2020-04-20 LAB — BRAIN NATRIURETIC PEPTIDE: B Natriuretic Peptide: 174.5 pg/mL — ABNORMAL HIGH (ref 0.0–100.0)

## 2020-04-20 MED ORDER — FUROSEMIDE 10 MG/ML IJ SOLN
40.0000 mg | Freq: Once | INTRAMUSCULAR | Status: AC
  Administered 2020-04-20: 40 mg via INTRAVENOUS
  Filled 2020-04-20: qty 4

## 2020-04-20 MED ORDER — ISOSORBIDE MONONITRATE ER 30 MG PO TB24
60.0000 mg | ORAL_TABLET | Freq: Every day | ORAL | Status: DC
  Administered 2020-04-20 – 2020-04-22 (×3): 60 mg via ORAL
  Filled 2020-04-20 (×3): qty 1

## 2020-04-20 NOTE — Progress Notes (Signed)
PROGRESS NOTE                                                                                                                                                                                                             Patient Demographics:    Alexander Parks, is a 69 y.o. male, DOB - 06-03-1951, OEU:[id number]  Outpatient Primary MD for the patient is Sasser, Silvestre Moment, MD    LOS - 81  Admit date - 04/03/2020    Chief Complaint  Patient presents with  . covid       Brief Narrative - 69 y.o.malewith medical history significantfor coronary artery disease status post CABG x5, cirrhosis of the liver, CHF, diabetes mellitus and nephrolithiasis currently works as a Administrator and not been vaccinated for COVID-19 was admitted to the hospital for severe acute hypoxic respiratory failure due to COVID-19 pneumonia at [facility] and thereafter transferred to [facility] for further care on heated high flow 35 L, continued to get worse despite full medical care, he was transferred to ICU team on 04/14/2020 as he became BiPAP dependent for several days, he has been stabilized somewhat and currently on 30 to 35 L heated high flow oxygen and transferred back to hospitalist service on 04/17/2020.   Subjective:   Patient in bed, appears comfortable, denies any headache, no fever, no chest pain or pressure, ++ shortness of breath , no abdominal pain. No focal weakness.    Assessment  & Plan :    1. Acute Hypoxic Resp. Failure due to Acute Covid 19 Viral Pneumonitis during the ongoing 2020 Covid 19 Pandemic - he is unvaccinated and unfortunately has incurred severe parenchymal lung injury, he has been started on high-dose IV steroids, remdesivir and Barisitinib  combination.  Diurese as needed with IV Lasix, became BiPAP dependent for a few days in ICU, now slightly better on 30 L heated high flow + NRB , still having some increased work of  breathing and will need close monitoring, steroids being tapered gently, repeat IV lasix on 04/20/20, continue to monitor closely.  Still overall extremely tenuous.  Encouraged the patient to sit up in chair in the daytime use I-S and flutter valve for pulmonary toiletry and then prone in bed when at night.  Will advance activity and titrate down oxygen as possible.   SpO2:  90 % O2 Flow Rate (L/min): 30 L/min FiO2 (%): 100 %  Recent Labs  Lab 04/14/20 0631 04/14/20 0631 04/15/20 0314 04/15/20 0314 04/16/20 0448 04/17/20 0322 04/18/20 0508 04/19/20 0238 04/20/20 0251  WBC 14.0*   < > 15.6*   < > 18.4* 20.7* 20.2* 17.3* 16.6*  PLT 142*   < > 143*   < > 149* 158 174 143* 157  CRP 3.8*   < > 3.1*   < > 3.3* 4.6* 5.0* 8.5* 5.5*  BNP 108.8*   < > 141.5*   < > 160.0* 307.2* 202.2* 176.3* 174.5*  DDIMER 1.29*  --  1.85*  --  1.84* 1.70*  --   --   --   PROCALCITON 0.11   < > <0.10   < > <0.10 <0.10 0.14 0.11 <0.10  AST 49*   < > 66*   < > 56* 57* 57* 64* 59*  ALT 71*   < > 89*   < > 84* 87* 77* 79* 82*  ALKPHOS 79   < > 95   < > 97 107 88 85 103  BILITOT 2.3*   < > 2.1*   < > 2.1* 2.2* 1.9* 1.8* 1.2  ALBUMIN 2.6*   < > 2.5*   < > 2.3* 2.4* 2.2* 2.0* 2.0*   < > = values in this interval not displayed.    2. CAD post CABG - no acute issues chest pain-free on aspirin, Coreg, Imdur and statin for secondary prevention.  3.  Chronic diastolic CHF EF 14%.  As needed Lasix, repeat IV Lasix on 04/20/2020.  4.  Dyslipidemia.  On statin.  5.  GERD.  On PPI.  6.  Cryptogenic cirrhosis recently diagnosed through biopsy .  Follow with Dr. Laural Golden in [place].    7. DM type II.  Currently on steroids.  Have adjusted his Levemir and sliding scale dose will monitor and adjust as needed.    Lab Results  Component Value Date   HGBA1C 7.9 (H) 03/27/2020   CBG (last 3)  Recent Labs    04/20/20 0017 04/20/20 0446 04/20/20 0751  GLUCAP 193* 149* 104*      Condition - Extremely  Guarded  Family Communication  :  Wife [phone] on 04/13/20, 04/17/20, 04/20/20  Code Status :  Full  Consults  : PCCM  Procedures  :  None  PUD Prophylaxis : PPI  Disposition Plan  :    Status is: Inpatient  Remains inpatient appropriate because:IV treatments appropriate due to intensity of illness or inability to take PO   Dispo:  Patient From: Home  Planned Disposition: To be determined  Expected discharge date: 04/11/20  Medically stable for discharge: No   DVT Prophylaxis  :  Lovenox   Lab Results  Component Value Date   PLT 157 04/20/2020    Diet :  Diet Order            Diet heart healthy/carb modified Room service appropriate? Yes; Fluid consistency: Thin  Diet effective now                  Inpatient Medications  Scheduled Meds: . amLODipine  10 mg Oral Daily  . vitamin C  500 mg Oral Daily  . aspirin EC  81 mg Oral Daily  . atorvastatin  80 mg Oral q1800  . carvedilol  3.125 mg Oral BID WC  . chlorhexidine  15 mL Mouth Rinse BID  . Chlorhexidine Gluconate Cloth  6 each Topical Daily  . enoxaparin (LOVENOX) injection  50 mg Subcutaneous Q24H  . feeding supplement (GLUCERNA SHAKE)  237 mL Oral TID BM  . insulin aspart  0-20 Units Subcutaneous Q4H  . insulin aspart  6 Units Subcutaneous TID WC  . insulin detemir  25 Units Subcutaneous BID  . Ipratropium-Albuterol  1 puff Inhalation Q6H  . mouth rinse  15 mL Mouth Rinse q12n4p  . methylPREDNISolone (SOLU-MEDROL) injection  60 mg Intravenous Q12H  . pantoprazole  40 mg Oral Daily  . zinc sulfate  220 mg Oral Daily   Continuous Infusions:  PRN Meds:.acetaminophen, bisacodyl, chlorpheniramine-HYDROcodone, docusate sodium, [DISCONTINUED] ondansetron **OR** ondansetron (ZOFRAN) IV, phenol  Antibiotics  :    Anti-infectives (From admission, onward)   Start     Dose/Rate Route Frequency Ordered Stop   04/10/20 1315  remdesivir 100 mg in sodium chloride 0.9 % 100 mL IVPB        100 mg 200 mL/hr  over 30 Minutes Intravenous  Once 04/10/20 1301 04/10/20 1355   04/07/20 1000  remdesivir 100 mg in sodium chloride 0.9 % 100 mL IVPB  Status:  Discontinued       "Followed by" Linked Group Details   100 mg 200 mL/hr over 30 Minutes Intravenous Daily 03/23/2020 1348 04/10/20 1301   04/05/2020 1400  remdesivir 100 mg in sodium chloride 0.9 % 100 mL IVPB       "Followed by" Linked Group Details   100 mg 200 mL/hr over 30 Minutes Intravenous Every 1 hr x 2 04/07/2020 1348 04/02/2020 1559       Time Spent in minutes  30   Lala Lund M.D on 04/20/2020 at 10:08 AM  To page go to [email or web address] - password [facility]  [facility] -  Office  [phone]    See all Orders from today for further details    Objective:   Vitals:   04/20/20 0752 04/20/20 0757 04/20/20 0821 04/20/20 0824  BP: 135/83  131/83   Pulse: 91   98  Resp: (!) 21 20    Temp: 97.6 F (36.4 C)     TempSrc: Axillary     SpO2: 92% 90%    Weight:      Height:        Wt Readings from Last 3 Encounters:  04/10/20 95.9 kg  03/26/20 102.5 kg  02/21/20 103 kg     Intake/Output Summary (Last 24 hours) at 04/20/2020 1008 Last data filed at 04/20/2020 1003 Gross per 24 hour  Intake 470 ml  Output 1580 ml  Net -1110 ml     Physical Exam  Awake Alert, No new F.N deficits, Normal affect [place].AT,PERRAL Supple Neck,No JVD, No cervical lymphadenopathy appriciated.  Symmetrical Chest wall movement, Good air movement bilaterally, few rales RRR,No Gallops, Rubs or new Murmurs, No Parasternal Heave +ve B.Sounds, Abd Soft, No tenderness, No organomegaly appriciated, No rebound - guarding or rigidity. No Cyanosis, Clubbing or edema, No new Rash or bruise     Data Review:    CBC Recent Labs  Lab 04/16/20 0448 04/17/20 0322 04/18/20 0508 04/19/20 0238 04/20/20 0251  WBC 18.4* 20.7* 20.2* 17.3* 16.6*  HGB 13.5 13.3 13.1 12.2* 12.6*  HCT 40.4 40.5 40.4 37.1* 38.1*  PLT 149* 158 174 143* 157  MCV 83.5 83.3 84.2  83.6 84.1  MCH 27.9 27.4 27.3 27.5 27.8  MCHC 33.4 32.8 32.4 32.9 33.1  RDW 16.4* 16.6* 16.7* 16.4* 16.5*  LYMPHSABS  --   --  1.2 0.7 0.7  MONOABS  --   --  1.1* 0.6 1.0  EOSABS  --   --  0.0 0.0 0.0  BASOSABS  --   --  0.0 0.0 0.0    Recent Labs  Lab 04/14/20 0631 04/14/20 2159 04/15/20 0314 04/15/20 0314 04/16/20 0448 04/17/20 0322 04/18/20 0508 04/19/20 0238 04/20/20 0251  NA 140   < > 136   < > 137 134* 131* 133* 133*  K 3.8   < > 4.1   < > 4.1 4.3 4.1 4.7 4.3  CL 101   < > 102   < > 101 99 95* 99 98  CO2 27   < > 22   < > 25 26 25 26 24   GLUCOSE 109*   < > 91   < > 142* 167* 143* 146* 197*  BUN 45*   < > 44*   < > 47* 47* 57* 55* 75*  CREATININE 1.29*   < > 0.93   < > 1.12 1.02 1.33* 1.06 1.21  CALCIUM 9.1   < > 9.3   < > 9.5 9.0 8.8* 8.8* 8.9  AST 49*  --  66*   < > 56* 57* 57* 64* 59*  ALT 71*  --  89*   < > 84* 87* 77* 79* 82*  ALKPHOS 79  --  95   < > 97 107 88 85 103  BILITOT 2.3*  --  2.1*   < > 2.1* 2.2* 1.9* 1.8* 1.2  ALBUMIN 2.6*  --  2.5*   < > 2.3* 2.4* 2.2* 2.0* 2.0*  MG 2.2   < > 2.2   < > 2.2 2.1 2.3 2.3 2.5*  CRP 3.8*  --  3.1*   < > 3.3* 4.6* 5.0* 8.5* 5.5*  DDIMER 1.29*  --  1.85*  --  1.84* 1.70*  --   --   --   PROCALCITON 0.11  --  <0.10   < > <0.10 <0.10 0.14 0.11 <0.10  BNP 108.8*  --  141.5*   < > 160.0* 307.2* 202.2* 176.3* 174.5*   < > = values in this interval not displayed.    ------------------------------------------------------------------------------------------------------------------ No results for input(s): CHOL, HDL, LDLCALC, TRIG, CHOLHDL, LDLDIRECT in the last 72 hours.  Lab Results  Component Value Date   HGBA1C 7.9 (H) 03/20/2020   ------------------------------------------------------------------------------------------------------------------ No results for input(s): TSH, T4TOTAL, T3FREE, THYROIDAB in the last 72 hours.  Invalid input(s): FREET3  Cardiac Enzymes No results for input(s): CKMB, TROPONINI, MYOGLOBIN in  the last 168 hours.  Invalid input(s): CK ------------------------------------------------------------------------------------------------------------------    Component Value Date/Time   BNP 174.5 (H) 04/20/2020 0251    Micro Results No results found for this or any previous visit (from the past 240 hour(s)).  Radiology Reports DG Chest Port 1 View  Result Date: 04/20/2020 CLINICAL DATA:  Shortness of breath, COVID-19 positive. EXAM: PORTABLE CHEST 1 VIEW COMPARISON:  April 18, 2020. FINDINGS: Stable cardiomediastinal silhouette. Status post coronary bypass graft. No pneumothorax or pleural effusion is noted. Stable bilateral lung opacities are noted concerning for multifocal pneumonia. Bony thorax is unremarkable. IMPRESSION: Stable bilateral lung opacities are noted concerning for multifocal pneumonia. Electronically Signed   By: Marijo Conception M.D.   On: 04/20/2020 08:22   DG Chest Port 1 View  Result Date: 04/18/2020 CLINICAL DATA:  COVID. EXAM: PORTABLE CHEST 1 VIEW COMPARISON:  04/13/2020 FINDINGS: Bilateral indistinct pulmonary opacities with mild progression from before. Borderline heart  size. Stable aortic tortuosity. There has been CABG. No visible effusion or pneumothorax. IMPRESSION: Mild progression of bilateral pneumonia. Electronically Signed   By: Monte Fantasia M.D.   On: 04/18/2020 09:05   DG Chest Port 1 View  Result Date: 04/13/2020 CLINICAL DATA:  Shortness of breath, COVID-19 positive EXAM: PORTABLE CHEST 1 VIEW COMPARISON:  04/10/2020 FINDINGS: Post CABG changes. Stable cardiomediastinal contours. Atherosclerotic calcification of the aortic knob. Patchy opacities within the periphery of the left mid to lower lung fields, slightly progressed from prior. Streaky right basilar interstitial opacity. No large pleural fluid collection. No pneumothorax. IMPRESSION: Slight progression of airspace opacities, most pronounced in the periphery of the left lung. Findings favored  to represent atypical/viral infection in the setting of known COVID 19. Electronically Signed   By: Davina Poke D.O.   On: 04/13/2020 08:26   DG Chest Port 1 View  Result Date: 04/10/2020 CLINICAL DATA:  Chest pain, shortness of breath and low oxygen saturation, recent diagnosis of COVID-19, diabetes mellitus, former smoker, cirrhosis, coronary artery disease post CABG EXAM: PORTABLE CHEST 1 VIEW COMPARISON:  Portable exam 1057 hours compared to 04/02/2020 FINDINGS: Upper normal heart size post CABG. Tortuous aorta with minimal atherosclerotic calcification. Pulmonary vascularity normal. Interstitial infiltrates LEFT lung question atypical infection. No pleural effusion or pneumothorax. Osseous structures unremarkable. IMPRESSION: Infiltrates in mid to inferior LEFT lung question atypical infection. Electronically Signed   By: Lavonia Dana M.D.   On: 04/10/2020 11:23   DG Chest Port 1 View  Result Date: 03/23/2020 CLINICAL DATA:  69 year old male positive COVID-19.  Cough. EXAM: PORTABLE CHEST 1 VIEW COMPARISON:  02/21/2020 chest radiographs and earlier. FINDINGS: AP view at 1137 hours. Increased lordotic positioning. Possibly lower lung volumes. Stable cardiac size and mediastinal contours. Prior CABG. Chronic increased interstitial markings in both lungs, with mild new patchy and vague opacity along the right minor fissure and possibly also at the left lung base. No superimposed pneumothorax or pleural effusion. Visualized tracheal air column is within normal limits. No acute osseous abnormality identified. Paucity of bowel gas in the upper abdomen. IMPRESSION: Chronic pulmonary interstitial changes suspected with evidence of mild superimposed COVID-19 pneumonia. Electronically Signed   By: Genevie Ann M.D.   On: 03/16/2020 11:51

## 2020-04-20 NOTE — Progress Notes (Signed)
Occupational Therapy Treatment Patient Details Name: Alexander Parks MRN: [id number] DOB: Oct 05, 1950 Today's Date: 04/20/2020    History of present illness [name] is a 69 year old male who presented with shortness of breath, cough and positive covid test found hypoxemic. He was admitted [place] for acute hypoxemic respiratory failure secondary to COVID-19. Transferred to [facility]. He was treated with remdesivir, solumedrol and barcitinib. He has completed remdesivir. He has been diuresed but no lasix was given in the last 72 hours. He developed worsening oxygen requirement with flow increased from 40L to 60L and then transferred to the ICU on 8/30.   OT comments  Pt continues to present with poor activity tolerance. Pt requesting to stay in bed and agreeable to theraband exercises. Pt with dropping to 77% during theraband exercises and requiring increased rest breaks. Requiring Min guard during roll to get on bedpad. Update dc recommendation to SNF per decline and will continue to follow acutely as admitted.    Follow Up Recommendations  SNF    Equipment Recommendations  3 in 1 bedside commode (Defer to next venue)    Recommendations for Other Services PT consult    Precautions / Restrictions Precautions Precautions: Fall       Mobility Bed Mobility Overal bed mobility: Needs Assistance Bed Mobility: Rolling Rolling: Min guard         General bed mobility comments: Cues for sequencing of rolling and Min Guard A. Decreased processing and requiring increased time  Transfers                      Balance                                           ADL either performed or assessed with clinical judgement   ADL Overall ADL's : Needs assistance/impaired                           Toilet Transfer Details (indicate cue type and reason): Declined to transfer to [facility] for BM. Requesting  bed pan.            General ADL Comments: Pt  declining to transfer to [facility] due to fatigue. Requesting to get on bedpan. Focused session on activity [place] with theraaband exercises     Vision       Perception     Praxis      Cognition Arousal/Alertness: Awake/alert Behavior During Therapy: WFL for tasks assessed/performed;Flat affect Overall Cognitive Status: Within Functional Limits for tasks assessed                                          Exercises Exercises: General Upper Extremity;Other exercises General Exercises - Upper Extremity Shoulder Flexion: Strengthening;10 reps;Supine;Theraband Theraband Level (Shoulder Flexion): Level 1 (Yellow) Shoulder Horizontal ABduction: Strengthening;10 reps;Theraband;Supine Theraband Level (Shoulder Horizontal Abduction): Level 1 (Yellow) Other Exercises Other Exercises: Purse lip breathing   Shoulder Instructions       General Comments SpO2 86% on 40L heated HFNC and 15L NRB. RR 21. SpO2 dropping to 77% with UE exercises in supine    Pertinent Vitals/ Pain       Pain Assessment: No/denies pain  Home Living  Prior Functioning/Environment              Frequency  Min 2X/week        Progress Toward Goals  OT Goals(current goals can now be found in the care plan section)  Progress towards OT goals: Progressing toward goals  Acute Rehab OT Goals Patient Stated Goal: better and back home OT Goal Formulation: With patient Time For Goal Achievement: 04/28/20 Potential to Achieve Goals: Good ADL Goals Pt Will Perform Grooming: with modified independence;sitting Pt Will Perform Lower Body Dressing: with min guard assist;sit to/from stand Pt Will Transfer to Toilet: with min guard assist;ambulating;bedside commode Pt Will Perform Toileting - Clothing Manipulation and hygiene: with min guard assist;sit to/from stand;sitting/lateral leans Additional ADL Goal #1: Pt will independently  verbalize three energy conservation techniques for ADLs Additional ADL Goal #2: Pt will monitor SpO2 and use purse lip breathing with Min cues during ADLs  Plan Discharge plan needs to be updated    Co-evaluation                 AM-PAC OT "6 Clicks" Daily Activity     Outcome Measure   Help from another person eating meals?: A Little Help from another person taking care of personal grooming?: A Little Help from another person toileting, which includes using toliet, bedpan, or urinal?: A Little Help from another person bathing (including washing, rinsing, drying)?: A Little Help from another person to put on and taking off regular upper body clothing?: A Little Help from another person to put on and taking off regular lower body clothing?: A Little 6 Click Score: 18    End of Session Equipment Utilized During Treatment: Oxygen (40L heated HFNC and 15L NRB)  OT Visit Diagnosis: Unsteadiness on feet (R26.81);Other abnormalities of gait and mobility (R26.89);Muscle weakness (generalized) (M62.81)   Activity Tolerance Patient limited by fatigue   Patient Left with nursing/sitter in room [facility])   Nurse Communication Mobility status;Other (comment) (vitals)        Time: 5364-6803 OT Time Calculation (min): 18 min  Charges: OT General Charges $OT Visit: 1 Visit OT Treatments $Self Care/Home Management : 8-22 mins  [place], OTR/L Acute Rehab Pager: [phone] Office: [place] 04/20/2020, 5:16 PM

## 2020-04-21 LAB — GLUCOSE, CAPILLARY
Glucose-Capillary: 102 mg/dL — ABNORMAL HIGH (ref 70–99)
Glucose-Capillary: 108 mg/dL — ABNORMAL HIGH (ref 70–99)
Glucose-Capillary: 164 mg/dL — ABNORMAL HIGH (ref 70–99)
Glucose-Capillary: 221 mg/dL — ABNORMAL HIGH (ref 70–99)
Glucose-Capillary: 226 mg/dL — ABNORMAL HIGH (ref 70–99)
Glucose-Capillary: 79 mg/dL (ref 70–99)

## 2020-04-21 LAB — CBC WITH DIFFERENTIAL/PLATELET
Abs Immature Granulocytes: 0.15 10*3/uL — ABNORMAL HIGH (ref 0.00–0.07)
Basophils Absolute: 0 10*3/uL (ref 0.0–0.1)
Basophils Relative: 0 %
Eosinophils Absolute: 0 10*3/uL (ref 0.0–0.5)
Eosinophils Relative: 0 %
HCT: 37 % — ABNORMAL LOW (ref 39.0–52.0)
Hemoglobin: 12.2 g/dL — ABNORMAL LOW (ref 13.0–17.0)
Immature Granulocytes: 1 %
Lymphocytes Relative: 4 %
Lymphs Abs: 0.8 10*3/uL (ref 0.7–4.0)
MCH: 27.8 pg (ref 26.0–34.0)
MCHC: 33 g/dL (ref 30.0–36.0)
MCV: 84.3 fL (ref 80.0–100.0)
Monocytes Absolute: 1.3 10*3/uL — ABNORMAL HIGH (ref 0.1–1.0)
Monocytes Relative: 7 %
Neutro Abs: 16.6 10*3/uL — ABNORMAL HIGH (ref 1.7–7.7)
Neutrophils Relative %: 88 %
Platelets: 185 10*3/uL (ref 150–400)
RBC: 4.39 MIL/uL (ref 4.22–5.81)
RDW: 16.6 % — ABNORMAL HIGH (ref 11.5–15.5)
WBC: 19 10*3/uL — ABNORMAL HIGH (ref 4.0–10.5)
nRBC: 0 % (ref 0.0–0.2)

## 2020-04-21 LAB — COMPREHENSIVE METABOLIC PANEL
ALT: 94 U/L — ABNORMAL HIGH (ref 0–44)
AST: 76 U/L — ABNORMAL HIGH (ref 15–41)
Albumin: 2.1 g/dL — ABNORMAL LOW (ref 3.5–5.0)
Alkaline Phosphatase: 102 U/L (ref 38–126)
Anion gap: 12 (ref 5–15)
BUN: 90 mg/dL — ABNORMAL HIGH (ref 8–23)
CO2: 24 mmol/L (ref 22–32)
Calcium: 8.6 mg/dL — ABNORMAL LOW (ref 8.9–10.3)
Chloride: 96 mmol/L — ABNORMAL LOW (ref 98–111)
Creatinine, Ser: 1.25 mg/dL — ABNORMAL HIGH (ref 0.61–1.24)
GFR calc Af Amer: >60 mL/min (ref >60)
GFR calc non Af Amer: 58 mL/min — ABNORMAL LOW (ref >60)
Glucose, Bld: 106 mg/dL — ABNORMAL HIGH (ref 70–99)
Potassium: 4.1 mmol/L (ref 3.5–5.1)
Sodium: 132 mmol/L — ABNORMAL LOW (ref 135–145)
Total Bilirubin: 1.8 mg/dL — ABNORMAL HIGH (ref 0.3–1.2)
Total Protein: 6.3 g/dL — ABNORMAL LOW (ref 6.5–8.1)

## 2020-04-21 LAB — PROCALCITONIN: Procalcitonin: <0.1 ng/mL

## 2020-04-21 LAB — MAGNESIUM: Magnesium: 2.5 mg/dL — ABNORMAL HIGH (ref 1.7–2.4)

## 2020-04-21 LAB — BRAIN NATRIURETIC PEPTIDE: B Natriuretic Peptide: 193.3 pg/mL — ABNORMAL HIGH (ref 0.0–100.0)

## 2020-04-21 LAB — C-REACTIVE PROTEIN: CRP: 2.6 mg/dL — ABNORMAL HIGH (ref <1.0)

## 2020-04-21 MED ORDER — POLYETHYLENE GLYCOL 3350 17 G PO PACK
17.0000 g | PACK | Freq: Every day | ORAL | Status: DC
  Administered 2020-04-22: 17 g via ORAL
  Filled 2020-04-21: qty 1

## 2020-04-21 MED ORDER — METHYLPREDNISOLONE SODIUM SUCC 40 MG IJ SOLR
40.0000 mg | Freq: Two times a day (BID) | INTRAMUSCULAR | Status: DC
  Administered 2020-04-21 – 2020-04-23 (×4): 40 mg via INTRAVENOUS
  Filled 2020-04-21 (×4): qty 1

## 2020-04-21 MED ORDER — FUROSEMIDE 10 MG/ML IJ SOLN
20.0000 mg | Freq: Once | INTRAMUSCULAR | Status: AC
  Administered 2020-04-21: 20 mg via INTRAVENOUS
  Filled 2020-04-21: qty 2

## 2020-04-21 NOTE — Progress Notes (Signed)
Physical Therapy Treatment Patient Details Name: Alexander Parks MRN: [id number] DOB: 11/03/1950 Today's Date: 04/21/2020    History of Present Illness Pt adm to [facility] 8/23 with acute hypoxic respiratory failure due to covid PNA Transferred to [facility] on 8/29 with worsening hypoxia and 40L HHFNC. Pt transferred to ICU on 8/30. Transferred back to floor on 9/3. PMH - CAD, CABG, cirrhosis, CHF, DM.     PT Comments    Pt extremely limited with activity. Sat EOB for 19 minutes. Very tenuous. Will continue to follow and progress activity as medically able. Updated DC recommendation to SNF. If pt recovers expect he will be very weak.    Follow Up Recommendations  SNF     Equipment Recommendations  Other (comment) (To be determined)    Recommendations for Other Services       Precautions / Restrictions Precautions Precautions: Fall;Other (comment) Precaution Comments: watch SpO2    Mobility  Bed Mobility Overal bed mobility: Needs Assistance Bed Mobility: Rolling;Supine to Sit;Sit to Supine Rolling: Min guard   Supine to sit: Mod assist Sit to supine: Min assist   General bed mobility comments: Assist to elevate trunk into sitting. Assist to bring legs back into bed on return to supine  Transfers                 General transfer comment: Pt refused  Ambulation/Gait                 Stairs             Wheelchair Mobility    Modified Rankin (Stroke Patients Only)       Balance Overall balance assessment: Needs assistance Sitting-balance support: No upper extremity supported;Feet supported Sitting balance-Leahy Scale: Fair Sitting balance - Comments: Pt sat EOB x 19 minutes. Sitting was all he could tolerate. Couldn't tolerate ex's or mobility from EOB                                    Cognition Arousal/Alertness: Awake/alert Behavior During Therapy: WFL for tasks assessed/performed;Flat affect Overall Cognitive Status: Within  Functional Limits for tasks assessed                                        Exercises      General Comments General comments (skin integrity, edema, etc.): Pt on 50L HHFNC and NRB at 15L. Resting SpO2 88-89%. SpO2 78% with initial move to EOB. Returned to 84% after 3-4 minutes. Stayed 83-85% in sitting EOB. Returned to 86-88% after return to supine with head elevated      Pertinent Vitals/Pain Pain Assessment: No/denies pain    Home Living                      Prior Function            PT Goals (current goals can now be found in the care plan section) Progress towards PT goals: Not progressing toward goals - comment    Frequency    Min 3X/week      PT Plan Discharge plan needs to be updated    Co-evaluation              AM-PAC PT "6 Clicks" Mobility   Outcome Measure  Help needed turning from your back to  your side while in a flat bed without using bedrails?: A Little Help needed moving from lying on your back to sitting on the side of a flat bed without using bedrails?: A Lot Help needed moving to and from a bed to a chair (including a wheelchair)?: A Lot Help needed standing up from a chair using your arms (e.g., wheelchair or bedside chair)?: A Lot Help needed to walk in hospital room?: Total Help needed climbing 3-5 steps with a railing? : Total 6 Click Score: 11    End of Session Equipment Utilized During Treatment: Oxygen Activity Tolerance: Treatment limited secondary to medical complications (Comment);Patient limited by fatigue (low SpO2, high O2 requirement) Patient left: in bed;with call bell/phone within reach Nurse Communication: Mobility status PT Visit Diagnosis: Other abnormalities of gait and mobility (R26.89);Difficulty in walking, not elsewhere classified (R26.2)     Time: 8550-1586 PT Time Calculation (min) (ACUTE ONLY): 39 min  Charges:  $Therapeutic Activity: 38-52 mins                     Circle Pager [phone] Office [place] 04/21/2020, 4:24 PM

## 2020-04-21 NOTE — Plan of Care (Signed)
  Problem: Education: Goal: Knowledge of General Education information will improve Description: Including pain rating scale, medication(s)/side effects and non-pharmacologic comfort measures Outcome: Progressing   Problem: Health Behavior/Discharge Planning: Goal: Ability to manage health-related needs will improve Outcome: Progressing   Problem: Clinical Measurements: Goal: Ability to maintain clinical measurements within normal limits will improve Outcome: Progressing Goal: Will remain free from infection Outcome: Progressing Goal: Diagnostic test results will improve Outcome: Progressing Goal: Respiratory complications will improve Outcome: Progressing Goal: Cardiovascular complication will be avoided Outcome: Progressing   Problem: Activity: Goal: Risk for activity intolerance will decrease Outcome: Progressing   Problem: Nutrition: Goal: Adequate nutrition will be maintained Outcome: Progressing   Problem: Education: Goal: Knowledge of risk factors and measures for prevention of condition will improve Outcome: Progressing   Problem: Coping: Goal: Psychosocial and spiritual needs will be supported Outcome: Progressing   Problem: Respiratory: Goal: Will maintain a patent airway Outcome: Progressing Goal: Complications related to the disease process, condition or treatment will be avoided or minimized Outcome: Progressing

## 2020-04-21 NOTE — Progress Notes (Signed)
PROGRESS NOTE                                                                                                                                                                                                             Patient Demographics:    Alexander Parks, is a 69 y.o. male, DOB - 06-22-1951, FFM:[id number]  Outpatient Primary MD for the patient is Sasser, Silvestre Moment, MD    LOS - 15  Admit date - 03/31/2020    Chief Complaint  Patient presents with  . covid       Brief Narrative - 69 y.o.malewith medical history significantfor coronary artery disease status post CABG x5, cirrhosis of the liver, CHF, diabetes mellitus and nephrolithiasis currently works as a Administrator and not been vaccinated for COVID-19 was admitted to the hospital for severe acute hypoxic respiratory failure due to COVID-19 pneumonia at [facility] and thereafter transferred to [facility] for further care on heated high flow 35 L, continued to get worse despite full medical care, he was transferred to ICU team on 04/14/2020 as he became BiPAP dependent for several days, he has been stabilized somewhat and currently on 30 to 35 L heated high flow oxygen and transferred back to hospitalist service on 04/17/2020.   Subjective:   Patient in bed, appears comfortable, denies any headache, no fever, no chest pain or pressure,++ shortness of breath , no abdominal pain. No focal weakness.   Assessment  & Plan :    1. Acute Hypoxic Resp. Failure due to Acute Covid 19 Viral Pneumonitis during the ongoing 2020 Covid 19 Pandemic - he is unvaccinated and unfortunately has incurred severe parenchymal lung injury, he has been started on high-dose IV steroids, remdesivir and Barisitinib  combination.  Diurese as needed with IV Lasix, became BiPAP dependent for a few days in ICU, now slightly better on 50 L heated high flow + NRB , still having some increased work of breathing  and will need close monitoring, steroids being tapered gently, repeat IV lasix on 04/6720, continue to monitor closely.  Still overall extremely tenuous.  Encouraged the patient to sit up in chair in the daytime use I-S and flutter valve for pulmonary toiletry and then prone in bed when at night.  Will advance activity and titrate down oxygen as possible.   SpO2: 90 %  O2 Flow Rate (L/min): 50 L/min FiO2 (%): 100 %  Recent Labs  Lab 04/15/20 0314 04/15/20 0314 04/16/20 0448 04/16/20 0448 04/17/20 0322 04/18/20 0508 04/19/20 0238 04/20/20 0251 04/21/20 0345  WBC 15.6*   < > 18.4*   < > 20.7* 20.2* 17.3* 16.6* 19.0*  PLT 143*   < > 149*   < > 158 174 143* 157 185  CRP 3.1*   < > 3.3*   < > 4.6* 5.0* 8.5* 5.5* 2.6*  BNP 141.5*   < > 160.0*   < > 307.2* 202.2* 176.3* 174.5* 193.3*  DDIMER 1.85*  --  1.84*  --  1.70*  --   --   --   --   PROCALCITON <0.10   < > <0.10   < > <0.10 0.14 0.11 <0.10 <0.10  AST 66*   < > 56*   < > 57* 57* 64* 59* 76*  ALT 89*   < > 84*   < > 87* 77* 79* 82* 94*  ALKPHOS 95   < > 97   < > 107 88 85 103 102  BILITOT 2.1*   < > 2.1*   < > 2.2* 1.9* 1.8* 1.2 1.8*  ALBUMIN 2.5*   < > 2.3*   < > 2.4* 2.2* 2.0* 2.0* 2.1*   < > = values in this interval not displayed.    2. CAD post CABG - no acute issues chest pain-free on aspirin, Coreg, Imdur and statin for secondary prevention.  3.  Chronic diastolic CHF EF 38%.  As needed Lasix, repeat IV Lasix on 04/20/2020.  4.  Dyslipidemia.  On statin.  5.  GERD.  On PPI.  6.  Cryptogenic cirrhosis recently diagnosed through biopsy .  Follow with Dr. Laural Golden in [place].    7.  Rising BUN and AKI.  Likely due to combination of high-dose steroids and some dehydration, still trying Lasix low-dose on 04/21/2020 as we are running out of options with his intense hypoxia and presence of rails, gently titrate steroids down, may have to withhold further Lasix if he continues to get worse.    8. DM type II.  Currently on  steroids.  Have adjusted his Levemir and sliding scale dose will monitor and adjust as needed.    Lab Results  Component Value Date   HGBA1C 7.9 (H) 03/17/2020   CBG (last 3)  Recent Labs    04/21/20 0011 04/21/20 0431 04/21/20 0739  GLUCAP 79 102* 108*      Condition - Extremely Guarded  Family Communication  :  Wife [phone] on 04/13/20, 04/17/20, 04/20/20, 04/21/20  Code Status :  Full  Consults  : PCCM  Procedures  :  None  PUD Prophylaxis : PPI  Disposition Plan  :    Status is: Inpatient  Remains inpatient appropriate because:IV treatments appropriate due to intensity of illness or inability to take PO   Dispo:  Patient From: Home  Planned Disposition: To be determined  Expected discharge date: 04/11/20  Medically stable for discharge: No   DVT Prophylaxis  :  Lovenox   Lab Results  Component Value Date   PLT 185 04/21/2020    Diet :  Diet Order            Diet heart healthy/carb modified Room service appropriate? Yes; Fluid consistency: Thin  Diet effective now                  Inpatient Medications  Scheduled  Meds: . amLODipine  10 mg Oral Daily  . vitamin C  500 mg Oral Daily  . aspirin EC  81 mg Oral Daily  . atorvastatin  80 mg Oral q1800  . carvedilol  3.125 mg Oral BID WC  . chlorhexidine  15 mL Mouth Rinse BID  . Chlorhexidine Gluconate Cloth  6 each Topical Daily  . enoxaparin (LOVENOX) injection  50 mg Subcutaneous Q24H  . feeding supplement (GLUCERNA SHAKE)  237 mL Oral TID BM  . furosemide  20 mg Intravenous Once  . insulin aspart  0-20 Units Subcutaneous Q4H  . insulin aspart  6 Units Subcutaneous TID WC  . insulin detemir  25 Units Subcutaneous BID  . Ipratropium-Albuterol  1 puff Inhalation Q6H  . isosorbide mononitrate  60 mg Oral Daily  . mouth rinse  15 mL Mouth Rinse q12n4p  . methylPREDNISolone (SOLU-MEDROL) injection  40 mg Intravenous Q12H  . pantoprazole  40 mg Oral Daily  . zinc sulfate  220 mg Oral Daily    Continuous Infusions:  PRN Meds:.acetaminophen, bisacodyl, chlorpheniramine-HYDROcodone, docusate sodium, [DISCONTINUED] ondansetron **OR** ondansetron (ZOFRAN) IV, phenol  Antibiotics  :    Anti-infectives (From admission, onward)   Start     Dose/Rate Route Frequency Ordered Stop   04/10/20 1315  remdesivir 100 mg in sodium chloride 0.9 % 100 mL IVPB        100 mg 200 mL/hr over 30 Minutes Intravenous  Once 04/10/20 1301 04/10/20 1355   04/07/20 1000  remdesivir 100 mg in sodium chloride 0.9 % 100 mL IVPB  Status:  Discontinued       "Followed by" Linked Group Details   100 mg 200 mL/hr over 30 Minutes Intravenous Daily 03/27/2020 1348 04/10/20 1301   03/20/2020 1400  remdesivir 100 mg in sodium chloride 0.9 % 100 mL IVPB       "Followed by" Linked Group Details   100 mg 200 mL/hr over 30 Minutes Intravenous Every 1 hr x 2 03/31/2020 1348 03/23/2020 1559       Time Spent in minutes  30   Lala Lund M.D on 04/21/2020 at 9:39 AM  To page go to [email or web address] - password [facility]  [facility] -  Office  [phone]    See all Orders from today for further details    Objective:   Vitals:   04/21/20 0400 04/21/20 0700 04/21/20 0741 04/21/20 0819  BP: 130/77  128/77   Pulse: 86  87   Resp: 20  20   Temp: 97.7 F (36.5 C)  (!) 97.5 F (36.4 C)   TempSrc: Axillary  Axillary   SpO2: 90% 90% (!) 88% 90%  Weight:      Height:        Wt Readings from Last 3 Encounters:  04/10/20 95.9 kg  03/26/20 102.5 kg  02/21/20 103 kg     Intake/Output Summary (Last 24 hours) at 04/21/2020 0939 Last data filed at 04/21/2020 0430 Gross per 24 hour  Intake 120 ml  Output 700 ml  Net -580 ml     Physical Exam  Awake Alert, No new F.N deficits, Normal affect [place].AT,PERRAL Supple Neck,No JVD, No cervical lymphadenopathy appriciated.  Symmetrical Chest wall movement, Good air movement bilaterally, few rales RRR,No Gallops, Rubs or new Murmurs, No Parasternal Heave +ve  B.Sounds, Abd Soft, No tenderness, No organomegaly appriciated, No rebound - guarding or rigidity. No Cyanosis, Clubbing or edema, No new Rash or bruise     Data Review:  CBC Recent Labs  Lab 04/17/20 0322 04/18/20 0508 04/19/20 0238 04/20/20 0251 04/21/20 0345  WBC 20.7* 20.2* 17.3* 16.6* 19.0*  HGB 13.3 13.1 12.2* 12.6* 12.2*  HCT 40.5 40.4 37.1* 38.1* 37.0*  PLT 158 174 143* 157 185  MCV 83.3 84.2 83.6 84.1 84.3  MCH 27.4 27.3 27.5 27.8 27.8  MCHC 32.8 32.4 32.9 33.1 33.0  RDW 16.6* 16.7* 16.4* 16.5* 16.6*  LYMPHSABS  --  1.2 0.7 0.7 0.8  MONOABS  --  1.1* 0.6 1.0 1.3*  EOSABS  --  0.0 0.0 0.0 0.0  BASOSABS  --  0.0 0.0 0.0 0.0    Recent Labs  Lab 04/15/20 0314 04/15/20 0314 04/16/20 0448 04/16/20 0448 04/17/20 0322 04/18/20 0508 04/19/20 0238 04/20/20 0251 04/21/20 0345  NA 136   < > 137   < > 134* 131* 133* 133* 132*  K 4.1   < > 4.1   < > 4.3 4.1 4.7 4.3 4.1  CL 102   < > 101   < > 99 95* 99 98 96*  CO2 22   < > 25   < > 26 25 26 24 24   GLUCOSE 91   < > 142*   < > 167* 143* 146* 197* 106*  BUN 44*   < > 47*   < > 47* 57* 55* 75* 90*  CREATININE 0.93   < > 1.12   < > 1.02 1.33* 1.06 1.21 1.25*  CALCIUM 9.3   < > 9.5   < > 9.0 8.8* 8.8* 8.9 8.6*  AST 66*   < > 56*   < > 57* 57* 64* 59* 76*  ALT 89*   < > 84*   < > 87* 77* 79* 82* 94*  ALKPHOS 95   < > 97   < > 107 88 85 103 102  BILITOT 2.1*   < > 2.1*   < > 2.2* 1.9* 1.8* 1.2 1.8*  ALBUMIN 2.5*   < > 2.3*   < > 2.4* 2.2* 2.0* 2.0* 2.1*  MG 2.2   < > 2.2   < > 2.1 2.3 2.3 2.5* 2.5*  CRP 3.1*   < > 3.3*   < > 4.6* 5.0* 8.5* 5.5* 2.6*  DDIMER 1.85*  --  1.84*  --  1.70*  --   --   --   --   PROCALCITON <0.10   < > <0.10   < > <0.10 0.14 0.11 <0.10 <0.10  BNP 141.5*   < > 160.0*   < > 307.2* 202.2* 176.3* 174.5* 193.3*   < > = values in this interval not displayed.    ------------------------------------------------------------------------------------------------------------------ No results for  input(s): CHOL, HDL, LDLCALC, TRIG, CHOLHDL, LDLDIRECT in the last 72 hours.  Lab Results  Component Value Date   HGBA1C 7.9 (H) 03/19/2020   ------------------------------------------------------------------------------------------------------------------ No results for input(s): TSH, T4TOTAL, T3FREE, THYROIDAB in the last 72 hours.  Invalid input(s): FREET3  Cardiac Enzymes No results for input(s): CKMB, TROPONINI, MYOGLOBIN in the last 168 hours.  Invalid input(s): CK ------------------------------------------------------------------------------------------------------------------    Component Value Date/Time   BNP 193.3 (H) 04/21/2020 0345    Micro Results No results found for this or any previous visit (from the past 240 hour(s)).  Radiology Reports DG Chest Port 1 View  Result Date: 04/20/2020 CLINICAL DATA:  Shortness of breath, COVID-19 positive. EXAM: PORTABLE CHEST 1 VIEW COMPARISON:  April 18, 2020. FINDINGS: Stable cardiomediastinal silhouette. Status post coronary bypass graft. No  pneumothorax or pleural effusion is noted. Stable bilateral lung opacities are noted concerning for multifocal pneumonia. Bony thorax is unremarkable. IMPRESSION: Stable bilateral lung opacities are noted concerning for multifocal pneumonia. Electronically Signed   By: Marijo Conception M.D.   On: 04/20/2020 08:22   DG Chest Port 1 View  Result Date: 04/18/2020 CLINICAL DATA:  COVID. EXAM: PORTABLE CHEST 1 VIEW COMPARISON:  04/13/2020 FINDINGS: Bilateral indistinct pulmonary opacities with mild progression from before. Borderline heart size. Stable aortic tortuosity. There has been CABG. No visible effusion or pneumothorax. IMPRESSION: Mild progression of bilateral pneumonia. Electronically Signed   By: Monte Fantasia M.D.   On: 04/18/2020 09:05   DG Chest Port 1 View  Result Date: 04/13/2020 CLINICAL DATA:  Shortness of breath, COVID-19 positive EXAM: PORTABLE CHEST 1 VIEW COMPARISON:   04/10/2020 FINDINGS: Post CABG changes. Stable cardiomediastinal contours. Atherosclerotic calcification of the aortic knob. Patchy opacities within the periphery of the left mid to lower lung fields, slightly progressed from prior. Streaky right basilar interstitial opacity. No large pleural fluid collection. No pneumothorax. IMPRESSION: Slight progression of airspace opacities, most pronounced in the periphery of the left lung. Findings favored to represent atypical/viral infection in the setting of known COVID 19. Electronically Signed   By: Davina Poke D.O.   On: 04/13/2020 08:26   DG Chest Port 1 View  Result Date: 04/10/2020 CLINICAL DATA:  Chest pain, shortness of breath and low oxygen saturation, recent diagnosis of COVID-19, diabetes mellitus, former smoker, cirrhosis, coronary artery disease post CABG EXAM: PORTABLE CHEST 1 VIEW COMPARISON:  Portable exam 1057 hours compared to 03/29/2020 FINDINGS: Upper normal heart size post CABG. Tortuous aorta with minimal atherosclerotic calcification. Pulmonary vascularity normal. Interstitial infiltrates LEFT lung question atypical infection. No pleural effusion or pneumothorax. Osseous structures unremarkable. IMPRESSION: Infiltrates in mid to inferior LEFT lung question atypical infection. Electronically Signed   By: Lavonia Dana M.D.   On: 04/10/2020 11:23   DG Chest Port 1 View  Result Date: 03/28/2020 CLINICAL DATA:  69 year old male positive COVID-19.  Cough. EXAM: PORTABLE CHEST 1 VIEW COMPARISON:  02/21/2020 chest radiographs and earlier. FINDINGS: AP view at 1137 hours. Increased lordotic positioning. Possibly lower lung volumes. Stable cardiac size and mediastinal contours. Prior CABG. Chronic increased interstitial markings in both lungs, with mild new patchy and vague opacity along the right minor fissure and possibly also at the left lung base. No superimposed pneumothorax or pleural effusion. Visualized tracheal air column is within normal  limits. No acute osseous abnormality identified. Paucity of bowel gas in the upper abdomen. IMPRESSION: Chronic pulmonary interstitial changes suspected with evidence of mild superimposed COVID-19 pneumonia. Electronically Signed   By: Genevie Ann M.D.   On: 04/09/2020 11:51

## 2020-04-22 LAB — GLUCOSE, CAPILLARY
Glucose-Capillary: 120 mg/dL — ABNORMAL HIGH (ref 70–99)
Glucose-Capillary: 137 mg/dL — ABNORMAL HIGH (ref 70–99)
Glucose-Capillary: 155 mg/dL — ABNORMAL HIGH (ref 70–99)
Glucose-Capillary: 190 mg/dL — ABNORMAL HIGH (ref 70–99)
Glucose-Capillary: 232 mg/dL — ABNORMAL HIGH (ref 70–99)
Glucose-Capillary: 45 mg/dL — ABNORMAL LOW (ref 70–99)
Glucose-Capillary: 85 mg/dL (ref 70–99)
Glucose-Capillary: 95 mg/dL (ref 70–99)
Glucose-Capillary: 97 mg/dL (ref 70–99)

## 2020-04-22 LAB — PROCALCITONIN: Procalcitonin: 0.1 ng/mL

## 2020-04-22 MED ORDER — DEXTROSE 50 % IV SOLN
INTRAVENOUS | Status: AC
  Administered 2020-04-23: 50 mL
  Filled 2020-04-22: qty 50

## 2020-04-22 MED ORDER — DEXMEDETOMIDINE HCL IN NACL 400 MCG/100ML IV SOLN
0.4000 ug/kg/h | INTRAVENOUS | Status: DC
  Administered 2020-04-22: 0.4 ug/kg/h via INTRAVENOUS
  Filled 2020-04-22 (×2): qty 100

## 2020-04-22 NOTE — Progress Notes (Signed)
Pt agitated and anxious with increased WOB on Bipap.  Requesting Precedex gtts from CCM at this time via Dellia Nims, RN.

## 2020-04-22 NOTE — Significant Event (Signed)
Rapid Response Event Note   Reason for Call :  Increased WOB & increased O2 needs  Initial Focused Assessment:  Patient using accessory muscles, skin tone pale.  RR 28  O2 sats85%  On 65L heated High Flow and NRB  Staff has just assisted patient from the chair into the bed.  After about 10 min O2 sats improved 93% BP 139/76  SR 90  He is alert and oriented.  He complains of being cold and fatigued.  Interventions:   Transfer to ICU  Placed on Bipap upon arrival  Plan of Care:    Event Summary:   MD Notified:   Dr Waldron Labs at bedside prior to my arrival Call Time: [place] Time:  1726 End Time: 1900  Raliegh Ip, RN

## 2020-04-22 NOTE — Progress Notes (Signed)
[facility] Progress Note Patient Name: Alexander Parks DOB: 10-29-1950 MRN: [id number]   Date of Service  04/22/2020  HPI/Events of Note  COVID+ patient saturating 91% on BiPAP with FiO2 100%. Patient is reporting anxiety per RN. He is well known to the unit and RN feels he would benefit from Precedex.   eICU Interventions  Precedex ordered for management of anxiety related to BiPAP and hypoxemic respiratory failure. Continue to monitor closely. Remains high risk for intubation.     Intervention Category Intermediate Interventions: Respiratory distress - evaluation and management Minor Interventions: Agitation / anxiety - evaluation and management  Charlott Rakes 04/22/2020, 7:40 PM

## 2020-04-22 NOTE — Progress Notes (Signed)
Patient transported with Rapid Response RN  to ICU 3M12 on a 15L NRB and 15L [place] HFNC.  Sats dropped to 80% during transport.  ICU RT's placed patient on Bipap upon arrival to unit.  Sats are slowly going up.

## 2020-04-22 NOTE — Progress Notes (Signed)
PROGRESS NOTE                                                                                                                                                                                                             Patient Demographics:    Alexander Parks, is a 69 y.o. male, DOB - 04-10-1951, KGY:[id number]  Outpatient Primary MD for the patient is Sasser, Silvestre Moment, MD    LOS - 16  Admit date - 03/28/2020    Chief Complaint  Patient presents with  . covid       Brief Narrative - 69 y.o.malewith medical history significantfor coronary artery disease status post CABG x5, cirrhosis of the liver, CHF, diabetes mellitus and nephrolithiasis currently works as a Administrator and not been vaccinated for COVID-19 was admitted to the hospital for severe acute hypoxic respiratory failure due to COVID-19 pneumonia at [facility] and thereafter transferred to [facility] for further care on heated high flow 35 L, continued to get worse despite full medical care, he was transferred to ICU team on 04/14/2020 as he became BiPAP dependent for several days, he has been stabilized somewhat and currently on 30 to 35 L heated high flow oxygen and transferred back to hospitalist service on 04/17/2020.   Subjective:   Patient in bed, appears comfortable, no fever, chills, he denies any chest pain or pressure, still reports dyspnea on cough .   Assessment  & Plan :   Acute Hypoxic Resp. Failure due to Acute Covid 19 Viral Pneumonitis during the ongoing 2020 Covid 19 Pandemic  - he is unvaccinated -He is with severe parenchymal lung injury, severe COVID-19 for pneumonia, he is with severe oxygen requirement, respiratory status remains very tenuous, remains high risk for intubation, prognosis is extremely guarded, this morning he is on 45 L heated high flow on 15 L nonrebreather as well  on top of it.   -He is treated with IV remdesivir  -Remains on  baricitinib . -Remains on IV Solu-Medrol. -On Lasix as needed, will hold on Lasix today.. - Encouraged the patient to sit up in chair in the daytime use I-S and flutter valve for pulmonary toiletry and then prone in bed when at night.  Will advance activity and titrate down oxygen as possible.   SpO2: (!) 86 % O2 Flow Rate (L/min): 45  L/min (+ 15L NRB) FiO2 (%): 100 %  Recent Labs  Lab 04/16/20 0448 04/16/20 0448 04/17/20 0322 04/17/20 0322 04/18/20 0508 04/19/20 0238 04/20/20 0251 04/21/20 0345 04/22/20 0543  WBC 18.4*   < > 20.7*  --  20.2* 17.3* 16.6* 19.0*  --   PLT 149*   < > 158  --  174 143* 157 185  --   CRP 3.3*   < > 4.6*  --  5.0* 8.5* 5.5* 2.6*  --   BNP 160.0*   < > 307.2*  --  202.2* 176.3* 174.5* 193.3*  --   DDIMER 1.84*  --  1.70*  --   --   --   --   --   --   PROCALCITON <0.10   < > <0.10   < > 0.14 0.11 <0.10 <0.10 0.10  AST 56*   < > 57*  --  57* 64* 59* 76*  --   ALT 84*   < > 87*  --  77* 79* 82* 94*  --   ALKPHOS 97   < > 107  --  88 85 103 102  --   BILITOT 2.1*   < > 2.2*  --  1.9* 1.8* 1.2 1.8*  --   ALBUMIN 2.3*   < > 2.4*  --  2.2* 2.0* 2.0* 2.1*  --    < > = values in this interval not displayed.    CAD post CABG - no acute issues chest pain-free on aspirin, Coreg, Imdur and statin for secondary prevention.  Chronic diastolic CHF EF 16%.  As needed Lasix, repeat IV Lasix on 04/20/2020.  Dyslipidemia.  On statin.  GERD.  On PPI.  Cryptogenic cirrhosis recently diagnosed through biopsy .  Follow with Dr. Laural Golden in [place].    Rising BUN and AKI.  Likely due to combination of high-dose steroids and some dehydration, and he remains on low-dose Lasix, I will hold his Lasix today.  DM type II.  Currently on steroids.  Have adjusted his Levemir and sliding scale dose will monitor and adjust as needed.    Lab Results  Component Value Date   HGBA1C 7.9 (H) 04/13/2020   CBG (last 3)  Recent Labs    04/22/20 0411 04/22/20 0817  04/22/20 1227  GLUCAP 97 85 232*      Condition - Extremely Guarded  Family Communication  :  Wife [phone] was updated today.  Code Status :  Full  Consults  : PCCM  Procedures  :  None  PUD Prophylaxis : PPI  Disposition Plan  :    Status is: Inpatient  Remains inpatient appropriate because:IV treatments appropriate due to intensity of illness or inability to take PO   Dispo:  Patient From:  home  Planned Disposition:  unstable  Expected discharge date: 05-10-2020  Medically stable for discharge:  No   DVT Prophylaxis  :  Lovenox   Lab Results  Component Value Date   PLT 185 04/21/2020    Diet :  Diet Order            Diet heart healthy/carb modified Room service appropriate? Yes; Fluid consistency: Thin  Diet effective now                  Inpatient Medications  Scheduled Meds: . amLODipine  10 mg Oral Daily  . vitamin C  500 mg Oral Daily  . aspirin EC  81 mg Oral Daily  . atorvastatin  80  mg Oral q1800  . carvedilol  3.125 mg Oral BID WC  . chlorhexidine  15 mL Mouth Rinse BID  . Chlorhexidine Gluconate Cloth  6 each Topical Daily  . enoxaparin (LOVENOX) injection  50 mg Subcutaneous Q24H  . feeding supplement (GLUCERNA SHAKE)  237 mL Oral TID BM  . insulin aspart  0-20 Units Subcutaneous Q4H  . insulin aspart  6 Units Subcutaneous TID WC  . insulin detemir  25 Units Subcutaneous BID  . Ipratropium-Albuterol  1 puff Inhalation Q6H  . isosorbide mononitrate  60 mg Oral Daily  . mouth rinse  15 mL Mouth Rinse q12n4p  . methylPREDNISolone (SOLU-MEDROL) injection  40 mg Intravenous Q12H  . pantoprazole  40 mg Oral Daily  . polyethylene glycol  17 g Oral Daily  . zinc sulfate  220 mg Oral Daily   Continuous Infusions:  PRN Meds:.acetaminophen, bisacodyl, chlorpheniramine-HYDROcodone, docusate sodium, [DISCONTINUED] ondansetron **OR** ondansetron (ZOFRAN) IV, phenol  Antibiotics  :    Anti-infectives (From admission, onward)   Start      Dose/Rate Route Frequency Ordered Stop   04/10/20 1315  remdesivir 100 mg in sodium chloride 0.9 % 100 mL IVPB        100 mg 200 mL/hr over 30 Minutes Intravenous  Once 04/10/20 1301 04/10/20 1355   04/07/20 1000  remdesivir 100 mg in sodium chloride 0.9 % 100 mL IVPB  Status:  Discontinued       "Followed by" Linked Group Details   100 mg 200 mL/hr over 30 Minutes Intravenous Daily 03/25/2020 1348 04/10/20 1301   04/04/2020 1400  remdesivir 100 mg in sodium chloride 0.9 % 100 mL IVPB       "Followed by" Linked Group Details   100 mg 200 mL/hr over 30 Minutes Intravenous Parks 1 hr x 2 03/29/2020 1348 04/11/2020 1559        Xylah Early M.D on 04/22/2020 at 1:10 PM  To page go to [email or web address]   [facility] -  Office  [phone]    See all Orders from today for further details    Objective:   Vitals:   04/22/20 0425 04/22/20 0813 04/22/20 0821 04/22/20 1224  BP: 125/74 (!) 159/83 (!) 159/83 120/74  Pulse: 85 90  84  Resp:  19 (!) 22 20  Temp: 97.6 F (36.4 C) 97.7 F (36.5 C)  98.1 F (36.7 C)  TempSrc: Axillary Axillary  Axillary  SpO2: 92% 91%  (!) 86%  Weight:      Height:        Wt Readings from Last 3 Encounters:  04/10/20 95.9 kg  03/26/20 102.5 kg  02/21/20 103 kg     Intake/Output Summary (Last 24 hours) at 04/22/2020 1310 Last data filed at 04/22/2020 0900 Gross per 24 hour  Intake 360 ml  Output --  Net 360 ml     Physical Exam  Awake Alert, Oriented X 3, No new F.N deficits, Normal affect Symmetrical Chest wall movement, Good air movement bilaterally, scattered rales RRR,No Gallops,Rubs or new Murmurs, No Parasternal Heave +ve B.Sounds, Abd Soft, No tenderness, No rebound - guarding or rigidity. No Cyanosis, Clubbing or edema, No new Rash or bruise       Data Review:    CBC Recent Labs  Lab 04/17/20 0322 04/18/20 0508 04/19/20 0238 04/20/20 0251 04/21/20 0345  WBC 20.7* 20.2* 17.3* 16.6* 19.0*  HGB 13.3 13.1 12.2* 12.6* 12.2*   HCT 40.5 40.4 37.1* 38.1* 37.0*  PLT 158 174 143* 157  185  MCV 83.3 84.2 83.6 84.1 84.3  MCH 27.4 27.3 27.5 27.8 27.8  MCHC 32.8 32.4 32.9 33.1 33.0  RDW 16.6* 16.7* 16.4* 16.5* 16.6*  LYMPHSABS  --  1.2 0.7 0.7 0.8  MONOABS  --  1.1* 0.6 1.0 1.3*  EOSABS  --  0.0 0.0 0.0 0.0  BASOSABS  --  0.0 0.0 0.0 0.0    Recent Labs  Lab 04/16/20 0448 04/16/20 0448 04/17/20 0322 04/17/20 0322 04/18/20 0508 04/19/20 0238 04/20/20 0251 04/21/20 0345 04/22/20 0543  NA 137   < > 134*  --  131* 133* 133* 132*  --   K 4.1   < > 4.3  --  4.1 4.7 4.3 4.1  --   CL 101   < > 99  --  95* 99 98 96*  --   CO2 25   < > 26  --  25 26 24 24   --   GLUCOSE 142*   < > 167*  --  143* 146* 197* 106*  --   BUN 47*   < > 47*  --  57* 55* 75* 90*  --   CREATININE 1.12   < > 1.02  --  1.33* 1.06 1.21 1.25*  --   CALCIUM 9.5   < > 9.0  --  8.8* 8.8* 8.9 8.6*  --   AST 56*   < > 57*  --  57* 64* 59* 76*  --   ALT 84*   < > 87*  --  77* 79* 82* 94*  --   ALKPHOS 97   < > 107  --  88 85 103 102  --   BILITOT 2.1*   < > 2.2*  --  1.9* 1.8* 1.2 1.8*  --   ALBUMIN 2.3*   < > 2.4*  --  2.2* 2.0* 2.0* 2.1*  --   MG 2.2   < > 2.1  --  2.3 2.3 2.5* 2.5*  --   CRP 3.3*   < > 4.6*  --  5.0* 8.5* 5.5* 2.6*  --   DDIMER 1.84*  --  1.70*  --   --   --   --   --   --   PROCALCITON <0.10   < > <0.10   < > 0.14 0.11 <0.10 <0.10 0.10  BNP 160.0*   < > 307.2*  --  202.2* 176.3* 174.5* 193.3*  --    < > = values in this interval not displayed.    ------------------------------------------------------------------------------------------------------------------ No results for input(s): CHOL, HDL, LDLCALC, TRIG, CHOLHDL, LDLDIRECT in the last 72 hours.  Lab Results  Component Value Date   HGBA1C 7.9 (H) 03/20/2020   ------------------------------------------------------------------------------------------------------------------ No results for input(s): TSH, T4TOTAL, T3FREE, THYROIDAB in the last 72 hours.  Invalid  input(s): FREET3  Cardiac Enzymes No results for input(s): CKMB, TROPONINI, MYOGLOBIN in the last 168 hours.  Invalid input(s): CK ------------------------------------------------------------------------------------------------------------------    Component Value Date/Time   BNP 193.3 (H) 04/21/2020 0345    Micro Results No results found for this or any previous visit (from the past 240 hour(s)).  Radiology Reports DG Chest Port 1 View  Result Date: 04/20/2020 CLINICAL DATA:  Shortness of breath, COVID-19 positive. EXAM: PORTABLE CHEST 1 VIEW COMPARISON:  April 18, 2020. FINDINGS: Stable cardiomediastinal silhouette. Status post coronary bypass graft. No pneumothorax or pleural effusion is noted. Stable bilateral lung opacities are noted concerning for multifocal pneumonia. Bony thorax is unremarkable. IMPRESSION: Stable bilateral lung  opacities are noted concerning for multifocal pneumonia. Electronically Signed   By: Marijo Conception M.D.   On: 04/20/2020 08:22   DG Chest Port 1 View  Result Date: 04/18/2020 CLINICAL DATA:  COVID. EXAM: PORTABLE CHEST 1 VIEW COMPARISON:  04/13/2020 FINDINGS: Bilateral indistinct pulmonary opacities with mild progression from before. Borderline heart size. Stable aortic tortuosity. There has been CABG. No visible effusion or pneumothorax. IMPRESSION: Mild progression of bilateral pneumonia. Electronically Signed   By: Monte Fantasia M.D.   On: 04/18/2020 09:05   DG Chest Port 1 View  Result Date: 04/13/2020 CLINICAL DATA:  Shortness of breath, COVID-19 positive EXAM: PORTABLE CHEST 1 VIEW COMPARISON:  04/10/2020 FINDINGS: Post CABG changes. Stable cardiomediastinal contours. Atherosclerotic calcification of the aortic knob. Patchy opacities within the periphery of the left mid to lower lung fields, slightly progressed from prior. Streaky right basilar interstitial opacity. No large pleural fluid collection. No pneumothorax. IMPRESSION: Slight progression  of airspace opacities, most pronounced in the periphery of the left lung. Findings favored to represent atypical/viral infection in the setting of known COVID 19. Electronically Signed   By: Davina Poke D.O.   On: 04/13/2020 08:26   DG Chest Port 1 View  Result Date: 04/10/2020 CLINICAL DATA:  Chest pain, shortness of breath and low oxygen saturation, recent diagnosis of COVID-19, diabetes mellitus, former smoker, cirrhosis, coronary artery disease post CABG EXAM: PORTABLE CHEST 1 VIEW COMPARISON:  Portable exam 1057 hours compared to 04/05/2020 FINDINGS: Upper normal heart size post CABG. Tortuous aorta with minimal atherosclerotic calcification. Pulmonary vascularity normal. Interstitial infiltrates LEFT lung question atypical infection. No pleural effusion or pneumothorax. Osseous structures unremarkable. IMPRESSION: Infiltrates in mid to inferior LEFT lung question atypical infection. Electronically Signed   By: Lavonia Dana M.D.   On: 04/10/2020 11:23   DG Chest Port 1 View  Result Date: 03/29/2020 CLINICAL DATA:  69 year old male positive COVID-19.  Cough. EXAM: PORTABLE CHEST 1 VIEW COMPARISON:  02/21/2020 chest radiographs and earlier. FINDINGS: AP view at 1137 hours. Increased lordotic positioning. Possibly lower lung volumes. Stable cardiac size and mediastinal contours. Prior CABG. Chronic increased interstitial markings in both lungs, with mild new patchy and vague opacity along the right minor fissure and possibly also at the left lung base. No superimposed pneumothorax or pleural effusion. Visualized tracheal air column is within normal limits. No acute osseous abnormality identified. Paucity of bowel gas in the upper abdomen. IMPRESSION: Chronic pulmonary interstitial changes suspected with evidence of mild superimposed COVID-19 pneumonia. Electronically Signed   By: Genevie Ann M.D.   On: 04/14/2020 11:51

## 2020-04-22 NOTE — Progress Notes (Signed)
Discussed with PCCM, patient is currently on 60 L heated high flow, and 15 L NRB, even though he does appear comfortable, in no respiratory distress or increased work of breathing, he is on maximal oxygen support at this point, and given he is high risk for decompensation, he is better to be monitored  in ICU, I have discussed with PCCM , PCCM agreeable to transfer to ICU, patient will remain under [facility] care as primary, and PCCM can see as needed. Phillips Climes MD

## 2020-04-23 ENCOUNTER — Inpatient Hospital Stay (HOSPITAL_COMMUNITY): Payer: Medicare HMO

## 2020-04-23 DIAGNOSIS — E1159 Type 2 diabetes mellitus with other circulatory complications: Secondary | ICD-10-CM

## 2020-04-23 LAB — COMPREHENSIVE METABOLIC PANEL
ALT: 106 U/L — ABNORMAL HIGH (ref 0–44)
AST: 85 U/L — ABNORMAL HIGH (ref 15–41)
Albumin: 2.1 g/dL — ABNORMAL LOW (ref 3.5–5.0)
Alkaline Phosphatase: 103 U/L (ref 38–126)
Anion gap: 13 (ref 5–15)
BUN: 119 mg/dL — ABNORMAL HIGH (ref 8–23)
CO2: 22 mmol/L (ref 22–32)
Calcium: 8.3 mg/dL — ABNORMAL LOW (ref 8.9–10.3)
Chloride: 101 mmol/L (ref 98–111)
Creatinine, Ser: 1.31 mg/dL — ABNORMAL HIGH (ref 0.61–1.24)
GFR calc Af Amer: >60 mL/min (ref >60)
GFR calc non Af Amer: 55 mL/min — ABNORMAL LOW (ref >60)
Glucose, Bld: 108 mg/dL — ABNORMAL HIGH (ref 70–99)
Potassium: 4.2 mmol/L (ref 3.5–5.1)
Sodium: 136 mmol/L (ref 135–145)
Total Bilirubin: 1.5 mg/dL — ABNORMAL HIGH (ref 0.3–1.2)
Total Protein: 6.3 g/dL — ABNORMAL LOW (ref 6.5–8.1)

## 2020-04-23 LAB — POCT I-STAT 7, (LYTES, BLD GAS, ICA,H+H)
Acid-base deficit: 1 mmol/L (ref 0.0–2.0)
Bicarbonate: 26.9 mmol/L (ref 20.0–28.0)
Calcium, Ion: 1.15 mmol/L (ref 1.15–1.40)
HCT: 36 % — ABNORMAL LOW (ref 39.0–52.0)
Hemoglobin: 12.2 g/dL — ABNORMAL LOW (ref 13.0–17.0)
O2 Saturation: 90 %
Potassium: 4 mmol/L (ref 3.5–5.1)
Sodium: 143 mmol/L (ref 135–145)
TCO2: 29 mmol/L (ref 22–32)
pCO2 arterial: 58.1 mmHg — ABNORMAL HIGH (ref 32.0–48.0)
pH, Arterial: 7.274 — ABNORMAL LOW (ref 7.350–7.450)
pO2, Arterial: 67 mmHg — ABNORMAL LOW (ref 83.0–108.0)

## 2020-04-23 LAB — CBC
HCT: 35.8 % — ABNORMAL LOW (ref 39.0–52.0)
Hemoglobin: 11.7 g/dL — ABNORMAL LOW (ref 13.0–17.0)
MCH: 27.7 pg (ref 26.0–34.0)
MCHC: 32.7 g/dL (ref 30.0–36.0)
MCV: 84.6 fL (ref 80.0–100.0)
Platelets: 147 10*3/uL — ABNORMAL LOW (ref 150–400)
RBC: 4.23 MIL/uL (ref 4.22–5.81)
RDW: 16.8 % — ABNORMAL HIGH (ref 11.5–15.5)
WBC: 21.4 10*3/uL — ABNORMAL HIGH (ref 4.0–10.5)
nRBC: 0 % (ref 0.0–0.2)

## 2020-04-23 LAB — GLUCOSE, CAPILLARY
Glucose-Capillary: 100 mg/dL — ABNORMAL HIGH (ref 70–99)
Glucose-Capillary: 124 mg/dL — ABNORMAL HIGH (ref 70–99)
Glucose-Capillary: 136 mg/dL — ABNORMAL HIGH (ref 70–99)
Glucose-Capillary: 151 mg/dL — ABNORMAL HIGH (ref 70–99)
Glucose-Capillary: 182 mg/dL — ABNORMAL HIGH (ref 70–99)
Glucose-Capillary: 185 mg/dL — ABNORMAL HIGH (ref 70–99)
Glucose-Capillary: 38 mg/dL — CL (ref 70–99)
Glucose-Capillary: 63 mg/dL — ABNORMAL LOW (ref 70–99)
Glucose-Capillary: 64 mg/dL — ABNORMAL LOW (ref 70–99)
Glucose-Capillary: 68 mg/dL — ABNORMAL LOW (ref 70–99)
Glucose-Capillary: 91 mg/dL (ref 70–99)

## 2020-04-23 LAB — MAGNESIUM: Magnesium: 3.3 mg/dL — ABNORMAL HIGH (ref 1.7–2.4)

## 2020-04-23 LAB — PHOSPHORUS: Phosphorus: 8.7 mg/dL — ABNORMAL HIGH (ref 2.5–4.6)

## 2020-04-23 LAB — D-DIMER, QUANTITATIVE: D-Dimer, Quant: 3.28 ug/mL-FEU — ABNORMAL HIGH (ref 0.00–0.50)

## 2020-04-23 LAB — C-REACTIVE PROTEIN: CRP: 4.7 mg/dL — ABNORMAL HIGH (ref <1.0)

## 2020-04-23 MED ORDER — DEXTROSE 50 % IV SOLN
INTRAVENOUS | Status: AC
  Filled 2020-04-23: qty 50

## 2020-04-23 MED ORDER — DEXTROSE 50 % IV SOLN
12.5000 g | Freq: Once | INTRAVENOUS | Status: AC
  Administered 2020-04-23: 12.5 g via INTRAVENOUS
  Filled 2020-04-23: qty 50

## 2020-04-23 MED ORDER — METHYLPREDNISOLONE SODIUM SUCC 40 MG IJ SOLR
20.0000 mg | Freq: Two times a day (BID) | INTRAMUSCULAR | Status: DC
  Administered 2020-04-23 – 2020-04-25 (×4): 20 mg via INTRAVENOUS
  Filled 2020-04-23 (×4): qty 1

## 2020-04-23 MED ORDER — ORAL CARE MOUTH RINSE
15.0000 mL | OROMUCOSAL | Status: DC
  Administered 2020-04-23 – 2020-05-01 (×78): 15 mL via OROMUCOSAL

## 2020-04-23 MED ORDER — SODIUM CHLORIDE 0.9 % IV BOLUS
500.0000 mL | Freq: Once | INTRAVENOUS | Status: AC
  Administered 2020-04-23: 500 mL via INTRAVENOUS

## 2020-04-23 MED ORDER — DEXMEDETOMIDINE HCL IN NACL 400 MCG/100ML IV SOLN
0.0000 ug/kg/h | INTRAVENOUS | Status: DC
  Administered 2020-04-23: 0.4 ug/kg/h via INTRAVENOUS

## 2020-04-23 MED ORDER — ALBUTEROL SULFATE HFA 108 (90 BASE) MCG/ACT IN AERS
1.0000 | INHALATION_SPRAY | RESPIRATORY_TRACT | Status: DC | PRN
  Filled 2020-04-23: qty 6.7

## 2020-04-23 MED ORDER — MIDAZOLAM HCL 2 MG/2ML IJ SOLN
2.0000 mg | Freq: Once | INTRAMUSCULAR | Status: AC
  Administered 2020-04-23: 2 mg via INTRAVENOUS

## 2020-04-23 MED ORDER — FENTANYL BOLUS VIA INFUSION
25.0000 ug | INTRAVENOUS | Status: DC | PRN
  Administered 2020-04-23 – 2020-04-28 (×3): 25 ug via INTRAVENOUS
  Filled 2020-04-23: qty 25

## 2020-04-23 MED ORDER — ENOXAPARIN SODIUM 60 MG/0.6ML ~~LOC~~ SOLN
50.0000 mg | Freq: Two times a day (BID) | SUBCUTANEOUS | Status: DC
  Administered 2020-04-23 – 2020-04-24 (×2): 50 mg via SUBCUTANEOUS
  Filled 2020-04-23 (×2): qty 0.6

## 2020-04-23 MED ORDER — FENTANYL CITRATE (PF) 100 MCG/2ML IJ SOLN
INTRAMUSCULAR | Status: AC
  Filled 2020-04-23: qty 2

## 2020-04-23 MED ORDER — FENTANYL CITRATE (PF) 100 MCG/2ML IJ SOLN: 25.0000 ug | Freq: Once | INTRAMUSCULAR | Status: AC

## 2020-04-23 MED ORDER — DEXTROSE 50 % IV SOLN
INTRAVENOUS | Status: AC
  Administered 2020-04-23: 50 mL
  Filled 2020-04-23: qty 50

## 2020-04-23 MED ORDER — ETOMIDATE 2 MG/ML IV SOLN
INTRAVENOUS | Status: AC
  Filled 2020-04-23: qty 20

## 2020-04-23 MED ORDER — SUCCINYLCHOLINE CHLORIDE 20 MG/ML IJ SOLN
100.0000 mg | Freq: Once | INTRAMUSCULAR | Status: AC
  Administered 2020-04-23: 100 mg via INTRAVENOUS
  Filled 2020-04-23: qty 5

## 2020-04-23 MED ORDER — DOCUSATE SODIUM 50 MG/5ML PO LIQD: 100.0000 mg | Freq: Two times a day (BID) | ORAL | Status: DC

## 2020-04-23 MED ORDER — ROCURONIUM BROMIDE 10 MG/ML (PF) SYRINGE
PREFILLED_SYRINGE | INTRAVENOUS | Status: AC
  Filled 2020-04-23: qty 10

## 2020-04-23 MED ORDER — INSULIN DETEMIR 100 UNIT/ML ~~LOC~~ SOLN
15.0000 [IU] | Freq: Two times a day (BID) | SUBCUTANEOUS | Status: DC
  Filled 2020-04-23 (×2): qty 0.15

## 2020-04-23 MED ORDER — PROSOURCE TF PO LIQD
45.0000 mL | Freq: Two times a day (BID) | ORAL | Status: DC
  Administered 2020-04-24: 45 mL
  Filled 2020-04-23: qty 45

## 2020-04-23 MED ORDER — FENTANYL 2500MCG IN NS 250ML (10MCG/ML) PREMIX INFUSION
25.0000 ug/h | INTRAVENOUS | Status: DC
  Administered 2020-04-23: 100 ug/h via INTRAVENOUS
  Administered 2020-04-24 – 2020-04-29 (×12): 200 ug/h via INTRAVENOUS
  Administered 2020-04-30: 175 ug/h via INTRAVENOUS
  Administered 2020-04-30: 200 ug/h via INTRAVENOUS
  Administered 2020-05-01: 175 ug/h via INTRAVENOUS
  Filled 2020-04-23 (×15): qty 250

## 2020-04-23 MED ORDER — MIDAZOLAM HCL 2 MG/2ML IJ SOLN
INTRAMUSCULAR | Status: AC
  Filled 2020-04-23: qty 4

## 2020-04-23 MED ORDER — FENTANYL CITRATE (PF) 100 MCG/2ML IJ SOLN
100.0000 ug | Freq: Once | INTRAMUSCULAR | Status: AC
  Administered 2020-04-23: 100 ug via INTRAVENOUS

## 2020-04-23 MED ORDER — INSULIN DETEMIR 100 UNIT/ML ~~LOC~~ SOLN
10.0000 [IU] | Freq: Two times a day (BID) | SUBCUTANEOUS | Status: DC
  Filled 2020-04-23: qty 0.1

## 2020-04-23 MED ORDER — DEXTROSE 50 % IV SOLN: 25.0000 g | Freq: Once | INTRAVENOUS | Status: AC

## 2020-04-23 MED ORDER — ETOMIDATE 2 MG/ML IV SOLN
15.0000 mg | Freq: Once | INTRAVENOUS | Status: AC
  Administered 2020-04-23: 15 mg via INTRAVENOUS

## 2020-04-23 MED ORDER — DEXTROSE 50 % IV SOLN
12.5000 g | Freq: Once | INTRAVENOUS | Status: AC
  Administered 2020-04-23: 12.5 g via INTRAVENOUS

## 2020-04-23 MED ORDER — DEXTROSE-NACL 5-0.9 % IV SOLN: INTRAVENOUS | Status: DC

## 2020-04-23 MED ORDER — CHLORHEXIDINE GLUCONATE 0.12% ORAL RINSE (MEDLINE KIT)
15.0000 mL | Freq: Two times a day (BID) | OROMUCOSAL | Status: DC
  Administered 2020-04-23 – 2020-05-01 (×16): 15 mL via OROMUCOSAL

## 2020-04-23 MED ORDER — FENTANYL CITRATE (PF) 100 MCG/2ML IJ SOLN
50.0000 ug | Freq: Once | INTRAMUSCULAR | Status: AC
  Administered 2020-04-23: 50 ug via INTRAVENOUS

## 2020-04-23 MED ORDER — POLYETHYLENE GLYCOL 3350 17 G PO PACK: 17.0000 g | PACK | Freq: Every day | ORAL | Status: DC

## 2020-04-23 MED ORDER — VITAL HIGH PROTEIN PO LIQD
1000.0000 mL | ORAL | Status: DC
  Administered 2020-04-24: 1000 mL
  Filled 2020-04-23: qty 1000

## 2020-04-23 NOTE — Progress Notes (Signed)
NAME:  Alexander Parks, MRN:  [id number], DOB:  1951-01-24, LOS: 24 ADMISSION DATE:  04/13/2020, CONSULTATION DATE: 04/23/2020 REFERRING MD: Elgergawy-TRH, CHIEF COMPLAINT: Hypoxia  HPI/course in hospital  69 year old man with worsening hypoxia related to COVID-19 pneumonia.  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  Admitted to [facility] 8/23.  Transfer to ICU [facility] 8/31 required BiPAP for several days then stabilized.  Transferred back to hospitalist service 9/3. Returns for worsening hypoxia started on BiPAP overnight.   Past Medical History   Past Medical History:  Diagnosis Date  . Allergy   . Cirrhosis ([place])   . Coronary artery disease   . Diabetes mellitus without complication ([place])   . History of kidney stones      Past Surgical History:  Procedure Laterality Date  . BALLOON DILATION  08/21/2012   Procedure: BALLOON DILATION;  Surgeon: Marissa Nestle, MD;  Location: [facility];  Service: Urology;  Laterality: Right;  Balloon Dilation Right Ureter  . BIOPSY  01/17/2020   Procedure: BIOPSY;  Surgeon: Rogene Houston, MD;  [facility];  Service: Endoscopy;;  antral esophageal  . CHOLECYSTECTOMY N/A 02/12/2020   Procedure: LAPAROSCOPIC CHOLECYSTECTOMY;  Surgeon: Virl Cagey, MD;  Location: [facility];  Service: General;  Laterality: N/A;  . COLONOSCOPY WITH PROPOFOL N/A 01/17/2020   Procedure: COLONOSCOPY WITH PROPOFOL;  Surgeon: Rogene Houston, MD;  [facility];  Service: Endoscopy;  Laterality: N/A;  730  . CORONARY ARTERY BYPASS GRAFT N/A 02/08/2018   Procedure: CORONARY ARTERY BYPASS GRAFT times five, LIMA-LAD, SVG-DIAG, SVG-OM, SVG-PD-PL(SEQ), using left internal mammary artery and Endoharvest of Right greater saphenous vein;  Surgeon: Grace Isaac, MD;  Location: [facility];  Service: Open Heart Surgery;  Laterality: N/A;  . CYSTOSCOPY W/  URETERAL STENT PLACEMENT  08/21/2012   Procedure: CYSTOSCOPY WITH RETROGRADE PYELOGRAM/URETERAL STENT PLACEMENT;  Surgeon: Marissa Nestle, MD;  Location: [facility];  Service: Urology;  Laterality: Right;  Cystoscopy with Right Retrograde Pyelogram, Right Ureteral Stent Placement  . ESOPHAGOGASTRODUODENOSCOPY (EGD) WITH PROPOFOL N/A 01/17/2020   Procedure: ESOPHAGOGASTRODUODENOSCOPY (EGD) WITH PROPOFOL;  Surgeon: Rogene Houston, MD;  [facility];  Service: Endoscopy;  Laterality: N/A;  . HERNIA REPAIR    . HOLMIUM LASER APPLICATION  11/16/345   Procedure: HOLMIUM LASER APPLICATION;  Surgeon: Marissa Nestle, MD;  Location: [facility];  Service: Urology;  Laterality: Right;  Holmium Laser Application to Right Ureteral Calculus  . INTRAOPERATIVE TRANSESOPHAGEAL ECHOCARDIOGRAM N/A 02/08/2018   Procedure: INTRAOPERATIVE TRANSESOPHAGEAL ECHOCARDIOGRAM;  Surgeon: Grace Isaac, MD;  Location: [facility];  Service: Open Heart Surgery;  Laterality: N/A;  . LIVER BIOPSY N/A 02/12/2020   Procedure: LIVER BIOPSY;  Surgeon: Virl Cagey, MD;  Location: [facility];  Service: General;  Laterality: N/A;  . POLYPECTOMY  01/17/2020   Procedure: POLYPECTOMY;  Surgeon: Rogene Houston, MD;  [facility];  Service: Endoscopy;;  colon  . RIGHT/LEFT HEART CATH AND CORONARY ANGIOGRAPHY N/A 02/07/2018   Procedure: RIGHT/LEFT HEART CATH AND CORONARY ANGIOGRAPHY;  Surgeon: Burnell Blanks, MD;  Location: [facility];  Service: Cardiovascular;  Laterality: N/A;  . STONE EXTRACTION WITH BASKET  08/21/2012   Procedure: STONE EXTRACTION WITH BASKET;  Surgeon: Marissa Nestle, MD;  Location: [facility];  Service: Urology;  Laterality: Right;  Right Ureteral Stone Pension scheme manager  .  UMBILICAL HERNIA REPAIR N/A 02/12/2020   Procedure: HERNIA REPAIR UMBILICAL ADULT;  Surgeon: Virl Cagey, MD;  Location: [facility];  Service: General;  Laterality: N/A;     Review of Systems:   Review of Systems    All other systems reviewed and are negative.   Social History   reports that he quit smoking about 23 years ago. His smoking use included cigarettes. He has a 30.00 pack-year smoking history. He has never used smokeless tobacco. He reports that he does not drink alcohol and does not use drugs.   Family History   His family history includes Cancer in his mother and sister; Liver disease in his brother.   Allergies Allergies  Allergen Reactions  . Benadryl [Diphenhydramine Hcl] Swelling  . Iodinated Diagnostic Agents Other (See Comments)    Unknown  . Morphine And Related Other (See Comments)    Unknown     Home Medications  Prior to Admission medications   Medication Sig Start Date End Date Taking? Authorizing Provider  aspirin EC 81 MG tablet Take 1 tablet (81 mg total) by mouth daily. 01/20/20  Yes Rehman, Mechele Dawley, MD  atorvastatin (LIPITOR) 80 MG tablet Take 1 tablet (80 mg total) by mouth daily at 6 PM. 02/13/18  Yes Tacy Dura, Donielle M, PA-C  carvedilol (COREG) 3.125 MG tablet TAKE 1 TABLET BY MOUTH TWICE DAILY WITH A MEAL Patient taking differently: Take 3.125 mg by mouth 2 (two) times daily with a meal.  12/20/19  Yes Branch, Alphonse Guild, MD  furosemide (LASIX) 40 MG tablet Take 1 tablet (40 mg total) by mouth daily. 02/13/18  Yes Lars Pinks M, PA-C  metFORMIN (GLUCOPHAGE-XR) 500 MG 24 hr tablet Take 1,000 mg by mouth 2 (two) times daily.    Yes [provider]  triamcinolone cream (KENALOG) 0.1 % Apply 1 application topically in the morning, at noon, and at bedtime.  12/05/19  Yes [provider]  docusate sodium (COLACE) 100 MG capsule Take 100 mg by mouth 2 (two) times daily as needed. 02/12/20   [provider]  lisinopril (ZESTRIL) 2.5 MG tablet Take 1 tablet by mouth once daily 04/09/20   Arnoldo Lenis, MD  pantoprazole (PROTONIX) 40 MG tablet Take 1 tablet (40 mg total) by mouth daily before breakfast. 01/17/20   Rehman, Mechele Dawley, MD      Interim history/subjective:  Currently he denies any worsening dyspnea.  Objective   Blood pressure 121/62, pulse 79, temperature (!) 96.6 F (35.9 C), temperature source Axillary, resp. rate (!) 30, height 6\' 2"  (1.88 m), weight 95.9 kg, SpO2 (!) 81 %.    Vent Mode: BIPAP FiO2 (%):  [90 %-100 %] 90 % Set Rate:  [10 bmp-18 bmp] 10 bmp PEEP:  [8 cmH20-10 cmH20] 8 cmH20 Pressure Support:  [14 cmH20] 14 cmH20   Intake/Output Summary (Last 24 hours) at 04/23/2020 1004 Last data filed at 04/23/2020 0900 Gross per 24 hour  Intake 566.13 ml  Output 1050 ml  Net -483.87 ml   Filed Weights   04/07/20 1738 04/09/20 0400 04/10/20 0500  Weight: 96.6 kg 96.6 kg 95.9 kg    Examination: Physical Exam Constitutional:      General: He is not in acute distress.    Appearance: He is normal weight. He is ill-appearing.  HENT:     Mouth/Throat:     Mouth: Mucous membranes are moist.  Eyes:     Conjunctiva/sclera: Conjunctivae normal.     Pupils: Pupils are equal,  round, and reactive to light.  Cardiovascular:     Rate and Rhythm: Normal rate and regular rhythm.  Pulmonary:     Breath sounds: Normal breath sounds. No wheezing or rhonchi.  Abdominal:     General: Abdomen is flat.     Palpations: Abdomen is soft.  Musculoskeletal:        General: No swelling.  Skin:    General: Skin is warm.  Neurological:     General: No focal deficit present.     Mental Status: He is alert.      Ancillary tests (personally reviewed)  CBC: Recent Labs  Lab 04/18/20 0508 04/19/20 0238 04/20/20 0251 04/21/20 0345 04/23/20 0248  WBC 20.2* 17.3* 16.6* 19.0* 21.4*  NEUTROABS 17.6* 15.7* 14.7* 16.6*  --   HGB 13.1 12.2* 12.6* 12.2* 11.7*  HCT 40.4 37.1* 38.1* 37.0* 35.8*  MCV 84.2 83.6 84.1 84.3 84.6  PLT 174 143* 157 185 147*    Basic Metabolic Panel: Recent Labs  Lab 04/17/20 0322 04/17/20 0322 04/18/20 0508 04/19/20 0238 04/20/20 0251 04/21/20 0345 04/23/20 0248  NA 134*   < >  131* 133* 133* 132* 136  K 4.3   < > 4.1 4.7 4.3 4.1 4.2  CL 99   < > 95* 99 98 96* 101  CO2 26   < > 25 26 24 24 22   GLUCOSE 167*   < > 143* 146* 197* 106* 108*  BUN 47*   < > 57* 55* 75* 90* 119*  CREATININE 1.02   < > 1.33* 1.06 1.21 1.25* 1.31*  CALCIUM 9.0   < > 8.8* 8.8* 8.9 8.6* 8.3*  MG 2.1  --  2.3 2.3 2.5* 2.5*  --    < > = values in this interval not displayed.   GFR: Estimated Creatinine Clearance: 61.9 mL/min (A) (by C-G formula based on SCr of 1.31 mg/dL (H)). Recent Labs  Lab 04/19/20 0238 04/20/20 0251 04/21/20 0345 04/22/20 0543 04/23/20 0248  PROCALCITON 0.11 <0.10 <0.10 0.10  --   WBC 17.3* 16.6* 19.0*  --  21.4*    Liver Function Tests: Recent Labs  Lab 04/18/20 0508 04/19/20 0238 04/20/20 0251 04/21/20 0345 04/23/20 0248  AST 57* 64* 59* 76* 85*  ALT 77* 79* 82* 94* 106*  ALKPHOS 88 85 103 102 103  BILITOT 1.9* 1.8* 1.2 1.8* 1.5*  PROT 6.9 6.5 6.5 6.3* 6.3*  ALBUMIN 2.2* 2.0* 2.0* 2.1* 2.1*   No results for input(s): LIPASE, AMYLASE in the last 168 hours. No results for input(s): AMMONIA in the last 168 hours.  ABG    Component Value Date/Time   PHART 7.387 02/09/2018 0558   PCO2ART 38.4 02/09/2018 0558   PO2ART 90.0 02/09/2018 0558   HCO3 23.0 02/09/2018 0558   TCO2 21 (L) 02/09/2018 1620   ACIDBASEDEF 2.0 02/09/2018 0558   O2SAT 67.4 02/10/2018 0335     Coagulation Profile: No results for input(s): INR, PROTIME in the last 168 hours.  Cardiac Enzymes: No results for input(s): CKTOTAL, CKMB, CKMBINDEX, TROPONINI in the last 168 hours.  HbA1C: Hemoglobin A1C  Date/Time Value Ref Range Status  04/29/2014 03:24 PM 8.8 (H) 4.2 - 6.3 % Final    Comment:    The American Diabetes Association recommends that a primary goal of therapy should be <7% and that physicians should reevaluate the treatment regimen in patients with HbA1c values consistently >8%.    Hgb A1c MFr Bld  Date/Time Value Ref Range Status  03/29/2020 11:59  AM 7.9  (H) 4.8 - 5.6 % Final    Comment:    (NOTE)         Prediabetes: 5.7 - 6.4         Diabetes: >6.4         Glycemic control for adults with diabetes: <7.0   02/10/2020 09:32 AM 7.3 (H) 4.8 - 5.6 % Final    Comment:    (NOTE) Pre diabetes:          5.7%-6.4%  Diabetes:              >6.4%  Glycemic control for   <7.0% adults with diabetes     CBG: Recent Labs  Lab 04/23/20 0023 04/23/20 0347 04/23/20 0406 04/23/20 0804 04/23/20 0846  GLUCAP 91 63* 185* 68* 124*     Assessment & Plan:   Critically ill due to acute hypoxic respiratory failure requiring high flow nasal cannula and noninvasive ventilation. While the patient is minimally symptomatic his oxygenation is very marginal. He is at high risk for intubation. 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 As such prognosis for recovery is less favorable and likely to be slow.  He would likely require prolonged mechanical ventilation and tracheostomy and he was advised of this. -Continue current combination of noninvasive ventilation and high flow nasal cannula -Encourage incentive spirometry and self proning. -Repeat chest x-ray as last film several days ago was relatively normal.  Is almost lunchtime Has completed course of remdesivir and baricitinib On extended course of steroids Generally no evidence for steroids in late phase ARDS, will taper at this time  Mild AKI.  Diuretics on hold.  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  Type 2 diabetes with worsening with steroids.  Daily Goals Checklist  Pain/Anxiety/Delirium protocol (if indicated): Dexmedetomidine VAP protocol (if indicated): Encourage self proning, incentive spirometry. Respiratory support goals: High  flow nasal cannula with intermittent BiPAP Blood pressure target: Allow autoregulation DVT prophylaxis: High-dose Lovenox Nutritional status and feeding goals: High nutritional risk.  Oral supplementation. GI prophylaxis: Pantoprazole. Fluid status goals: In negative fluid balance and appears euvolemic.  No diuresis. Urinary catheter: External urinary catheter Central lines: Peripheral IVs only Glucose control: Euglycemic.  Watch for hypoglycemia once prednisone tapered Mobility/therapy needs: Up to chair as tolerated Antibiotic de-escalation: No antibiotics Home medication reconciliation: Continue home antihypertensives Daily labs: CBC, BMP Code Status: Full code Family Communication: We will update family today. Disposition: ICU  CRITICAL CARE Performed by: Kipp Brood   Total critical care time: 35 minutes  Critical care time was exclusive of separately billable procedures and treating other patients.  Critical care was necessary to treat or prevent imminent or life-threatening deterioration.  Critical care was time spent personally by me on the following activities: development of treatment plan with patient and/or surrogate as well as nursing, discussions with consultants, evaluation of patient's response to treatment, examination of patient, obtaining history from patient or surrogate, ordering and performing treatments and interventions, ordering and review of laboratory studies, ordering and review of radiographic studies, pulse oximetry, re-evaluation of patient's condition and participation in multidisciplinary rounds.  Kipp Brood, MD [facility] ICU Physician [place]  Pager: [phone] Mobile: [phone] After hours: [phone].    Kipp Brood, MD [facility] ICU Physician [place]  Pager: [phone] Mobile: [phone]  After hours: [phone].  04/23/2020, 10:04 AM

## 2020-04-23 NOTE — Progress Notes (Signed)
[facility] Progress Note Patient Name: Alexander Parks DOB: 1950-11-30 MRN: [id number]   Date of Service  04/23/2020  HPI/Events of Note  Notified of hypoglycemic events.   The patient has had 2 events requiring D50.   eICU Interventions  Start on D5NS.     Intervention Category Minor Interventions: Other:  Elsie Lincoln 04/23/2020, 4:34 AM

## 2020-04-23 NOTE — Procedures (Signed)
Intubation Procedure Note  RUTILIO YELLOWHAIR  [id number]  02-16-1951  Date:04/23/20  Time:6:34 PM   Provider Performing:Ailed Defibaugh    Procedure: Intubation (31500)  Indication(s) Respiratory Failure  Consent Risks of the procedure as well as the alternatives and risks of each were explained to the patient and/or caregiver.  Consent for the procedure was obtained and is signed in the bedside chart   Anesthesia Etomidate, Versed, Fentanyl and Succinylcholine   Time Out Verified patient identification, verified procedure, site/side was marked, verified correct patient position, special equipment/implants available, medications/allergies/relevant history reviewed, required imaging and test results available.   Sterile Technique Usual hand hygeine, masks, and gloves were used   Procedure Description Patient positioned in bed supine.  Sedation given as noted above.  Patient was intubated with endotracheal tube using Glidescope.  View was Grade 1 full glottis .  Number of attempts was 1.  Colorimetric CO2 detector was consistent with tracheal placement.   Complications/Tolerance None; patient tolerated the procedure well. Chest X-ray is ordered to verify placement.  Kipp Brood, MD [facility] ICU Physician [place]  Pager: [phone] Mobile: [phone] After hours: [phone].  04/23/2020, 6:35 PM

## 2020-04-23 NOTE — Progress Notes (Signed)
Pt taken off bipap and placed on HHFNC at 50L/100% plus 15L NRB. Pt spo2 80% however no increased WOB. Pt denies SOB but asking for water. Pt had dark blister on bottom R lip and small abrasion to bridge of nose from bipap mask. Mepilex will be placed if pt goes back on bipap

## 2020-04-23 NOTE — Progress Notes (Signed)
PT Cancellation Note  Patient Details Name: Alexander Parks MRN: [id number] DOB: [birth year]   Cancelled Treatment:    Reason Eval/Treat Not Completed: Medical issues which prohibited therapy (Pt with incr O2 needs and possibly to be intubated per nsg. )   Alexander Parks 04/23/2020, 10:07 AM Ima Hafner W,PT Acute Rehabilitation Services Pager:  [phone]  Office:  [phone]

## 2020-04-23 NOTE — Progress Notes (Signed)
Settings modified per very large tidal and consistent volumes in high 1200's to 1300's

## 2020-04-23 NOTE — Progress Notes (Signed)
PROGRESS NOTE                                                                                                                                                                                                             Patient Demographics:    Alexander Parks, is a 69 y.o. male, DOB - 05-Apr-1951, NIO:[id number]  Outpatient Primary MD for the patient is Sasser, Silvestre Moment, MD    LOS - 17  Admit date - 03/18/2020    Chief Complaint  Patient presents with  . covid       Brief Narrative - 69 y.o.malewith medical history significantfor coronary artery disease status post CABG x5, cirrhosis of the liver, CHF, diabetes mellitus and nephrolithiasis currently works as a Administrator and not been vaccinated for COVID-19 was admitted to the hospital for severe acute hypoxic respiratory failure due to COVID-19 pneumonia at [facility] and thereafter transferred to [facility] for further care on heated high flow 35 L, continued to get worse despite full medical care, he was transferred to ICU team on 04/14/2020 as he became BiPAP dependent for several days, he has been stabilized somewhat and currently on 30 to 35 L heated high flow oxygen and transferred back to hospitalist service on 04/17/2020.   Subjective:   Patient did require BIPAP overnight, this am he is on 50L Heated HF and 15L NRB.  He had an episode of hypoglycemia yesterday.   Assessment  & Plan :   Acute Hypoxic Resp. Failure due to Acute Covid 19 Viral Pneumonitis during the ongoing 2020 Covid 19 Pandemic  - he is unvaccinated -He is with severe parenchymal lung injury, severe COVID-19 for pneumonia, he is with severe oxygen requirement, respiratory status remains very tenuous, he is transferred back to ICU 9/8 given increased oxygen requirement where he is on 60 L heated high flow and 15 L nonrebreather, he did require BiPAP overnight, I did discuss with PCCM this morning, he is a  high risk for intubation .  -Treated with IV remdesivir  -He was treated with baricitinib  -Remains on IV Solu-Medrol. -On Lasix as needed, will hold on Lasix today given creatinine of 1.3. -Discussed with staff, will try to prone today.   -His D-dimer is elevated today at 3.28, will increase his Lovenox to 0.5 mg/kg every 12 hours.  SpO2: (!) 81 % O2 Flow Rate (L/min): 65 L/min FiO2 (%): 90 %  Recent Labs  Lab 04/17/20 0322 04/17/20 0322 04/18/20 0508 04/19/20 0238 04/20/20 0251 04/21/20 0345 04/22/20 0543 04/23/20 0248  WBC 20.7*   < > 20.2* 17.3* 16.6* 19.0*  --  21.4*  PLT 158   < > 174 143* 157 185  --  147*  CRP 4.6*   < > 5.0* 8.5* 5.5* 2.6*  --  4.7*  BNP 307.2*  --  202.2* 176.3* 174.5* 193.3*  --   --   DDIMER 1.70*  --   --   --   --   --   --  3.28*  PROCALCITON <0.10   < > 0.14 0.11 <0.10 <0.10 0.10  --   AST 57*   < > 57* 64* 59* 76*  --  85*  ALT 87*   < > 77* 79* 82* 94*  --  106*  ALKPHOS 107   < > 88 85 103 102  --  103  BILITOT 2.2*   < > 1.9* 1.8* 1.2 1.8*  --  1.5*  ALBUMIN 2.4*   < > 2.2* 2.0* 2.0* 2.1*  --  2.1*   < > = values in this interval not displayed.    CAD post CABG - no acute issues chest pain-free on aspirin, Coreg, Imdur and statin for secondary prevention.  Chronic diastolic CHF EF 35%.  As needed Lasix, repeat IV Lasix on 04/20/2020.  Dyslipidemia.  On statin.  GERD.  On PPI.  Cryptogenic cirrhosis recently diagnosed through biopsy .  Follow with Dr. Laural Golden in [place].    Rising BUN and AKI.  Likely due to combination of high-dose steroids and some dehydration, and he remains on low-dose Lasix, I will hold his Lasix today.  DM type II.  Currently on steroids.  He is with hypoglycemia this morning.  I have lowered his Levemir to 15 units twice daily    Lab Results  Component Value Date   HGBA1C 7.9 (H) 04/04/2020   CBG (last 3)  Recent Labs    04/23/20 0406 04/23/20 0804 04/23/20 0846  GLUCAP 185* 68* 124*       Condition - Extremely Guarded  Family Communication  :  Wife [phone] is updated daily, I have informed her about his transfer to ICU and he is high risk for intubation.  Code Status :  Full  Consults  : PCCM  Procedures  :  None  PUD Prophylaxis : PPI  Disposition Plan  :    Status is: Inpatient  Remains inpatient appropriate because:IV treatments appropriate due to intensity of illness or inability to take PO   Dispo:  Patient From:  home  Planned Disposition:  unstable  Expected discharge date: 2020-05-27  Medically stable for discharge:  No   DVT Prophylaxis  :  Lovenox   Lab Results  Component Value Date   PLT 147 (L) 04/23/2020    Diet :  Diet Order            Diet heart healthy/carb modified Room service appropriate? Yes; Fluid consistency: Thin  Diet effective now                  Inpatient Medications  Scheduled Meds: . amLODipine  10 mg Oral Daily  . vitamin C  500 mg Oral Daily  . aspirin EC  81 mg Oral Daily  . atorvastatin  80 mg Oral q1800  . carvedilol  3.125 mg Oral BID WC  . chlorhexidine  15 mL Mouth Rinse BID  . Chlorhexidine Gluconate Cloth  6 each Topical Daily  . enoxaparin (LOVENOX) injection  50 mg Subcutaneous Q24H  . feeding supplement (GLUCERNA SHAKE)  237 mL Oral TID BM  . insulin aspart  0-20 Units Subcutaneous Q4H  . insulin aspart  6 Units Subcutaneous TID WC  . insulin detemir  15 Units Subcutaneous BID  . Ipratropium-Albuterol  1 puff Inhalation Q6H  . isosorbide mononitrate  60 mg Oral Daily  . mouth rinse  15 mL Mouth Rinse q12n4p  . methylPREDNISolone (SOLU-MEDROL) injection  40 mg Intravenous Q12H  . pantoprazole  40 mg Oral Daily  . polyethylene glycol  17 g Oral Daily  . zinc sulfate  220 mg Oral Daily   Continuous Infusions: . dexmedetomidine (PRECEDEX) IV infusion Stopped (04/23/20 0359)  . dextrose 5 % and 0.9% NaCl 50 mL/hr at 04/23/20 0900   PRN Meds:.acetaminophen, bisacodyl,  chlorpheniramine-HYDROcodone, docusate sodium, [DISCONTINUED] ondansetron **OR** ondansetron (ZOFRAN) IV, phenol  Antibiotics  :    Anti-infectives (From admission, onward)   Start     Dose/Rate Route Frequency Ordered Stop   04/10/20 1315  remdesivir 100 mg in sodium chloride 0.9 % 100 mL IVPB        100 mg 200 mL/hr over 30 Minutes Intravenous  Once 04/10/20 1301 04/10/20 1355   04/07/20 1000  remdesivir 100 mg in sodium chloride 0.9 % 100 mL IVPB  Status:  Discontinued       "Followed by" Linked Group Details   100 mg 200 mL/hr over 30 Minutes Intravenous Daily 04/13/2020 1348 04/10/20 1301   03/31/2020 1400  remdesivir 100 mg in sodium chloride 0.9 % 100 mL IVPB       "Followed by" Linked Group Details   100 mg 200 mL/hr over 30 Minutes Intravenous Every 1 hr x 2 04/07/2020 1348 04/05/2020 1559        Shandria Clinch M.D on 04/23/2020 at 11:06 AM  To page go to [email or web address]   [facility] -  Office  [phone]    See all Orders from today for further details    Objective:   Vitals:   04/23/20 0700 04/23/20 0745 04/23/20 0800 04/23/20 0807  BP: 124/67 124/67 121/62   Pulse: 75 78 79   Resp: (!) 27 (!) 29 (!) 30   Temp:    (!) 96.6 F (35.9 C)  TempSrc:    Axillary  SpO2: (!) 87% (!) 86% (!) 81%   Weight:      Height:        Wt Readings from Last 3 Encounters:  04/10/20 95.9 kg  03/26/20 102.5 kg  02/21/20 103 kg     Intake/Output Summary (Last 24 hours) at 04/23/2020 1106 Last data filed at 04/23/2020 0900 Gross per 24 hour  Intake 566.13 ml  Output 1050 ml  Net -483.87 ml     Physical Exam  Awake Alert, extremely frail, deconditioned, in mild respiratory discomfort . Symmetrical Chest wall movement, in mild respiratory distress, with diminished air entry at the bases, tachypneic. RRR,No Gallops,Rubs or new Murmurs, No Parasternal Heave +ve B.Sounds, Abd Soft, No tenderness, No rebound - guarding or rigidity. No Cyanosis, Clubbing or edema, No new  Rash or bruise        Data Review:    CBC Recent Labs  Lab 04/18/20 0508 04/19/20 0238 04/20/20 0251 04/21/20 0345 04/23/20 0248  WBC 20.2* 17.3* 16.6* 19.0* 21.4*  HGB 13.1 12.2* 12.6* 12.2* 11.7*  HCT 40.4 37.1* 38.1* 37.0* 35.8*  PLT 174 143* 157 185 147*  MCV 84.2 83.6 84.1 84.3 84.6  MCH 27.3 27.5 27.8 27.8 27.7  MCHC 32.4 32.9 33.1 33.0 32.7  RDW 16.7* 16.4* 16.5* 16.6* 16.8*  LYMPHSABS 1.2 0.7 0.7 0.8  --   MONOABS 1.1* 0.6 1.0 1.3*  --   EOSABS 0.0 0.0 0.0 0.0  --   BASOSABS 0.0 0.0 0.0 0.0  --     Recent Labs  Lab 04/17/20 0322 04/17/20 0322 04/18/20 0508 04/19/20 0238 04/20/20 0251 04/21/20 0345 04/22/20 0543 04/23/20 0248  NA 134*   < > 131* 133* 133* 132*  --  136  K 4.3   < > 4.1 4.7 4.3 4.1  --  4.2  CL 99   < > 95* 99 98 96*  --  101  CO2 26   < > 25 26 24 24   --  22  GLUCOSE 167*   < > 143* 146* 197* 106*  --  108*  BUN 47*   < > 57* 55* 75* 90*  --  119*  CREATININE 1.02   < > 1.33* 1.06 1.21 1.25*  --  1.31*  CALCIUM 9.0   < > 8.8* 8.8* 8.9 8.6*  --  8.3*  AST 57*   < > 57* 64* 59* 76*  --  85*  ALT 87*   < > 77* 79* 82* 94*  --  106*  ALKPHOS 107   < > 88 85 103 102  --  103  BILITOT 2.2*   < > 1.9* 1.8* 1.2 1.8*  --  1.5*  ALBUMIN 2.4*   < > 2.2* 2.0* 2.0* 2.1*  --  2.1*  MG 2.1  --  2.3 2.3 2.5* 2.5*  --   --   CRP 4.6*   < > 5.0* 8.5* 5.5* 2.6*  --  4.7*  DDIMER 1.70*  --   --   --   --   --   --  3.28*  PROCALCITON <0.10   < > 0.14 0.11 <0.10 <0.10 0.10  --   BNP 307.2*  --  202.2* 176.3* 174.5* 193.3*  --   --    < > = values in this interval not displayed.    ------------------------------------------------------------------------------------------------------------------ No results for input(s): CHOL, HDL, LDLCALC, TRIG, CHOLHDL, LDLDIRECT in the last 72 hours.  Lab Results  Component Value Date   HGBA1C 7.9 (H) 03/23/2020    ------------------------------------------------------------------------------------------------------------------ No results for input(s): TSH, T4TOTAL, T3FREE, THYROIDAB in the last 72 hours.  Invalid input(s): FREET3  Cardiac Enzymes No results for input(s): CKMB, TROPONINI, MYOGLOBIN in the last 168 hours.  Invalid input(s): CK ------------------------------------------------------------------------------------------------------------------    Component Value Date/Time   BNP 193.3 (H) 04/21/2020 0345    Micro Results No results found for this or any previous visit (from the past 240 hour(s)).  Radiology Reports DG Chest Port 1 View  Result Date: 04/20/2020 CLINICAL DATA:  Shortness of breath, COVID-19 positive. EXAM: PORTABLE CHEST 1 VIEW COMPARISON:  April 18, 2020. FINDINGS: Stable cardiomediastinal silhouette. Status post coronary bypass graft. No pneumothorax or pleural effusion is noted. Stable bilateral lung opacities are noted concerning for multifocal pneumonia. Bony thorax is unremarkable. IMPRESSION: Stable bilateral lung opacities are noted concerning for multifocal pneumonia. Electronically Signed   By: Marijo Conception M.D.   On: 04/20/2020 08:22   DG Chest Port 1 View  Result Date: 04/18/2020 CLINICAL  DATA:  COVID. EXAM: PORTABLE CHEST 1 VIEW COMPARISON:  04/13/2020 FINDINGS: Bilateral indistinct pulmonary opacities with mild progression from before. Borderline heart size. Stable aortic tortuosity. There has been CABG. No visible effusion or pneumothorax. IMPRESSION: Mild progression of bilateral pneumonia. Electronically Signed   By: Monte Fantasia M.D.   On: 04/18/2020 09:05   DG Chest Port 1 View  Result Date: 04/13/2020 CLINICAL DATA:  Shortness of breath, COVID-19 positive EXAM: PORTABLE CHEST 1 VIEW COMPARISON:  04/10/2020 FINDINGS: Post CABG changes. Stable cardiomediastinal contours. Atherosclerotic calcification of the aortic knob. Patchy opacities within  the periphery of the left mid to lower lung fields, slightly progressed from prior. Streaky right basilar interstitial opacity. No large pleural fluid collection. No pneumothorax. IMPRESSION: Slight progression of airspace opacities, most pronounced in the periphery of the left lung. Findings favored to represent atypical/viral infection in the setting of known COVID 19. Electronically Signed   By: Davina Poke D.O.   On: 04/13/2020 08:26   DG Chest Port 1 View  Result Date: 04/10/2020 CLINICAL DATA:  Chest pain, shortness of breath and low oxygen saturation, recent diagnosis of COVID-19, diabetes mellitus, former smoker, cirrhosis, coronary artery disease post CABG EXAM: PORTABLE CHEST 1 VIEW COMPARISON:  Portable exam 1057 hours compared to 03/28/2020 FINDINGS: Upper normal heart size post CABG. Tortuous aorta with minimal atherosclerotic calcification. Pulmonary vascularity normal. Interstitial infiltrates LEFT lung question atypical infection. No pleural effusion or pneumothorax. Osseous structures unremarkable. IMPRESSION: Infiltrates in mid to inferior LEFT lung question atypical infection. Electronically Signed   By: Lavonia Dana M.D.   On: 04/10/2020 11:23   DG Chest Port 1 View  Result Date: 04/08/2020 CLINICAL DATA:  69 year old male positive COVID-19.  Cough. EXAM: PORTABLE CHEST 1 VIEW COMPARISON:  02/21/2020 chest radiographs and earlier. FINDINGS: AP view at 1137 hours. Increased lordotic positioning. Possibly lower lung volumes. Stable cardiac size and mediastinal contours. Prior CABG. Chronic increased interstitial markings in both lungs, with mild new patchy and vague opacity along the right minor fissure and possibly also at the left lung base. No superimposed pneumothorax or pleural effusion. Visualized tracheal air column is within normal limits. No acute osseous abnormality identified. Paucity of bowel gas in the upper abdomen. IMPRESSION: Chronic pulmonary interstitial changes  suspected with evidence of mild superimposed COVID-19 pneumonia. Electronically Signed   By: Genevie Ann M.D.   On: 03/24/2020 11:51

## 2020-04-23 NOTE — Progress Notes (Signed)
OT Cancellation Note  Patient Details Name: Alexander Parks MRN: [id number] DOB: 09/11/1950   Cancelled Treatment:    Reason Eval/Treat Not Completed: Patient not medically ready (RN state hold. Pt possible intubation today. Requiring increased O2 and SpO2 85-83%. Will return as pt medically ready. Thank you.)  Dent, OTR/L Acute Rehab Pager: [phone] Office: [phone] 04/23/2020, 10:41 AM

## 2020-04-23 NOTE — Progress Notes (Signed)
Dr. Waldron Labs was made aware that patient's CBG at 0800 was 64 and was 68 at 1200.   Orders to increase D5NS to 75 ml/hr given.

## 2020-04-23 NOTE — Progress Notes (Signed)
Jasmine Maceachern (wife) was notified that patient needs to be placed on the ventilator at 1645. All questions were answered at this time.   She was also notified after intubation that he's currently stable and tolerated the procedure. Password for family notification is Sam. All questions were answered at this time.

## 2020-04-24 ENCOUNTER — Inpatient Hospital Stay: Payer: Self-pay

## 2020-04-24 ENCOUNTER — Inpatient Hospital Stay (HOSPITAL_COMMUNITY): Payer: Medicare HMO

## 2020-04-24 DIAGNOSIS — R579 Shock, unspecified: Secondary | ICD-10-CM

## 2020-04-24 DIAGNOSIS — R7989 Other specified abnormal findings of blood chemistry: Secondary | ICD-10-CM

## 2020-04-24 DIAGNOSIS — I2510 Atherosclerotic heart disease of native coronary artery without angina pectoris: Secondary | ICD-10-CM

## 2020-04-24 LAB — POCT I-STAT 7, (LYTES, BLD GAS, ICA,H+H)
Acid-Base Excess: 1 mmol/L (ref 0.0–2.0)
Acid-Base Excess: 2 mmol/L (ref 0.0–2.0)
Acid-base deficit: 3 mmol/L — ABNORMAL HIGH (ref 0.0–2.0)
Acid-base deficit: 4 mmol/L — ABNORMAL HIGH (ref 0.0–2.0)
Bicarbonate: 26.5 mmol/L (ref 20.0–28.0)
Bicarbonate: 27 mmol/L (ref 20.0–28.0)
Bicarbonate: 28.8 mmol/L — ABNORMAL HIGH (ref 20.0–28.0)
Bicarbonate: 29.1 mmol/L — ABNORMAL HIGH (ref 20.0–28.0)
Calcium, Ion: 1.04 mmol/L — ABNORMAL LOW (ref 1.15–1.40)
Calcium, Ion: 1.11 mmol/L — ABNORMAL LOW (ref 1.15–1.40)
Calcium, Ion: 1.13 mmol/L — ABNORMAL LOW (ref 1.15–1.40)
Calcium, Ion: 1.13 mmol/L — ABNORMAL LOW (ref 1.15–1.40)
HCT: 34 % — ABNORMAL LOW (ref 39.0–52.0)
HCT: 35 % — ABNORMAL LOW (ref 39.0–52.0)
HCT: 35 % — ABNORMAL LOW (ref 39.0–52.0)
HCT: 36 % — ABNORMAL LOW (ref 39.0–52.0)
Hemoglobin: 11.6 g/dL — ABNORMAL LOW (ref 13.0–17.0)
Hemoglobin: 11.9 g/dL — ABNORMAL LOW (ref 13.0–17.0)
Hemoglobin: 11.9 g/dL — ABNORMAL LOW (ref 13.0–17.0)
Hemoglobin: 12.2 g/dL — ABNORMAL LOW (ref 13.0–17.0)
O2 Saturation: 73 %
O2 Saturation: 76 %
O2 Saturation: 85 %
O2 Saturation: 88 %
Patient temperature: 96.5
Patient temperature: 96.5
Patient temperature: 99.3
Patient temperature: 99.3
Potassium: 5.4 mmol/L — ABNORMAL HIGH (ref 3.5–5.1)
Potassium: 5.5 mmol/L — ABNORMAL HIGH (ref 3.5–5.1)
Potassium: 5.6 mmol/L — ABNORMAL HIGH (ref 3.5–5.1)
Potassium: 6 mmol/L — ABNORMAL HIGH (ref 3.5–5.1)
Sodium: 141 mmol/L (ref 135–145)
Sodium: 142 mmol/L (ref 135–145)
Sodium: 144 mmol/L (ref 135–145)
Sodium: 146 mmol/L — ABNORMAL HIGH (ref 135–145)
TCO2: 29 mmol/L (ref 22–32)
TCO2: 29 mmol/L (ref 22–32)
TCO2: 31 mmol/L (ref 22–32)
TCO2: 31 mmol/L (ref 22–32)
pCO2 arterial: 55.3 mmHg — ABNORMAL HIGH (ref 32.0–48.0)
pCO2 arterial: 57.5 mmHg — ABNORMAL HIGH (ref 32.0–48.0)
pCO2 arterial: 73.3 mmHg (ref 32.0–48.0)
pCO2 arterial: 75.7 mmHg (ref 32.0–48.0)
pH, Arterial: 7.155 — CL (ref 7.350–7.450)
pH, Arterial: 7.177 — CL (ref 7.350–7.450)
pH, Arterial: 7.302 — ABNORMAL LOW (ref 7.350–7.450)
pH, Arterial: 7.323 — ABNORMAL LOW (ref 7.350–7.450)
pO2, Arterial: 52 mmHg — ABNORMAL LOW (ref 83.0–108.0)
pO2, Arterial: 52 mmHg — ABNORMAL LOW (ref 83.0–108.0)
pO2, Arterial: 54 mmHg — ABNORMAL LOW (ref 83.0–108.0)
pO2, Arterial: 58 mmHg — ABNORMAL LOW (ref 83.0–108.0)

## 2020-04-24 LAB — CBC
HCT: 40 % (ref 39.0–52.0)
Hemoglobin: 11.9 g/dL — ABNORMAL LOW (ref 13.0–17.0)
MCH: 28.1 pg (ref 26.0–34.0)
MCHC: 29.8 g/dL — ABNORMAL LOW (ref 30.0–36.0)
MCV: 94.3 fL (ref 80.0–100.0)
Platelets: 165 10*3/uL (ref 150–400)
RBC: 4.24 MIL/uL (ref 4.22–5.81)
RDW: 17.7 % — ABNORMAL HIGH (ref 11.5–15.5)
WBC: 24.7 10*3/uL — ABNORMAL HIGH (ref 4.0–10.5)
nRBC: 0 % (ref 0.0–0.2)

## 2020-04-24 LAB — BASIC METABOLIC PANEL
Anion gap: 16 — ABNORMAL HIGH (ref 5–15)
Anion gap: 17 — ABNORMAL HIGH (ref 5–15)
BUN: 144 mg/dL — ABNORMAL HIGH (ref 8–23)
BUN: 146 mg/dL — ABNORMAL HIGH (ref 8–23)
CO2: 24 mmol/L (ref 22–32)
CO2: 21 mmol/L — ABNORMAL LOW (ref 22–32)
Calcium: 8 mg/dL — ABNORMAL LOW (ref 8.9–10.3)
Calcium: 8.1 mg/dL — ABNORMAL LOW (ref 8.9–10.3)
Chloride: 106 mmol/L (ref 98–111)
Chloride: 108 mmol/L (ref 98–111)
Creatinine, Ser: 2.31 mg/dL — ABNORMAL HIGH (ref 0.61–1.24)
Creatinine, Ser: 2.2 mg/dL — ABNORMAL HIGH (ref 0.61–1.24)
GFR calc Af Amer: 32 mL/min — ABNORMAL LOW (ref >60)
GFR calc Af Amer: 34 mL/min — ABNORMAL LOW (ref >60)
GFR calc non Af Amer: 28 mL/min — ABNORMAL LOW (ref >60)
GFR calc non Af Amer: 29 mL/min — ABNORMAL LOW (ref >60)
Glucose, Bld: 246 mg/dL — ABNORMAL HIGH (ref 70–99)
Glucose, Bld: 234 mg/dL — ABNORMAL HIGH (ref 70–99)
Potassium: 5.7 mmol/L — ABNORMAL HIGH (ref 3.5–5.1)
Potassium: 6 mmol/L — ABNORMAL HIGH (ref 3.5–5.1)
Sodium: 146 mmol/L — ABNORMAL HIGH (ref 135–145)
Sodium: 146 mmol/L — ABNORMAL HIGH (ref 135–145)

## 2020-04-24 LAB — COMPREHENSIVE METABOLIC PANEL
ALT: 114 U/L — ABNORMAL HIGH (ref 0–44)
AST: 104 U/L — ABNORMAL HIGH (ref 15–41)
Albumin: 1.9 g/dL — ABNORMAL LOW (ref 3.5–5.0)
Alkaline Phosphatase: 107 U/L (ref 38–126)
Anion gap: 15 (ref 5–15)
BUN: 138 mg/dL — ABNORMAL HIGH (ref 8–23)
CO2: 19 mmol/L — ABNORMAL LOW (ref 22–32)
Calcium: 7.6 mg/dL — ABNORMAL LOW (ref 8.9–10.3)
Chloride: 108 mmol/L (ref 98–111)
Creatinine, Ser: 2.15 mg/dL — ABNORMAL HIGH (ref 0.61–1.24)
GFR calc Af Amer: 35 mL/min — ABNORMAL LOW (ref >60)
GFR calc non Af Amer: 30 mL/min — ABNORMAL LOW (ref >60)
Glucose, Bld: 225 mg/dL — ABNORMAL HIGH (ref 70–99)
Potassium: 6 mmol/L — ABNORMAL HIGH (ref 3.5–5.1)
Sodium: 142 mmol/L (ref 135–145)
Total Bilirubin: 1.1 mg/dL (ref 0.3–1.2)
Total Protein: 5.9 g/dL — ABNORMAL LOW (ref 6.5–8.1)

## 2020-04-24 LAB — MAGNESIUM
Magnesium: 3.4 mg/dL — ABNORMAL HIGH (ref 1.7–2.4)
Magnesium: 3.4 mg/dL — ABNORMAL HIGH (ref 1.7–2.4)

## 2020-04-24 LAB — GLUCOSE, CAPILLARY
Glucose-Capillary: 139 mg/dL — ABNORMAL HIGH (ref 70–99)
Glucose-Capillary: 202 mg/dL — ABNORMAL HIGH (ref 70–99)
Glucose-Capillary: 206 mg/dL — ABNORMAL HIGH (ref 70–99)
Glucose-Capillary: 210 mg/dL — ABNORMAL HIGH (ref 70–99)
Glucose-Capillary: 217 mg/dL — ABNORMAL HIGH (ref 70–99)
Glucose-Capillary: 229 mg/dL — ABNORMAL HIGH (ref 70–99)
Glucose-Capillary: 239 mg/dL — ABNORMAL HIGH (ref 70–99)

## 2020-04-24 LAB — PHOSPHORUS
Phosphorus: 11.6 mg/dL — ABNORMAL HIGH (ref 2.5–4.6)
Phosphorus: 10.3 mg/dL — ABNORMAL HIGH (ref 2.5–4.6)

## 2020-04-24 LAB — ECHOCARDIOGRAM LIMITED
Area-P 1/2: 3.27 cm2
Height: 74 in
S' Lateral: 2.6 cm
Weight: 3164.04 oz

## 2020-04-24 LAB — D-DIMER, QUANTITATIVE: D-Dimer, Quant: 3.95 ug/mL-FEU — ABNORMAL HIGH (ref 0.00–0.50)

## 2020-04-24 MED ORDER — ARTIFICIAL TEARS OPHTHALMIC OINT
1.0000 "application " | TOPICAL_OINTMENT | Freq: Three times a day (TID) | OPHTHALMIC | Status: DC
  Administered 2020-04-24 – 2020-04-25 (×4): 1 via OPHTHALMIC
  Filled 2020-04-24: qty 3.5

## 2020-04-24 MED ORDER — SODIUM BICARBONATE 8.4 % IV SOLN
50.0000 meq | Freq: Once | INTRAVENOUS | Status: AC
  Administered 2020-04-24: 50 meq via INTRAVENOUS
  Filled 2020-04-24: qty 50

## 2020-04-24 MED ORDER — MIDAZOLAM 50MG/50ML (1MG/ML) PREMIX INFUSION
0.5000 mg/h | INTRAVENOUS | Status: DC
  Administered 2020-04-24: 2 mg/h via INTRAVENOUS
  Administered 2020-04-24: 0.5 mg/h via INTRAVENOUS
  Administered 2020-04-25 – 2020-04-27 (×4): 3 mg/h via INTRAVENOUS
  Administered 2020-04-27 – 2020-04-29 (×7): 6 mg/h via INTRAVENOUS
  Administered 2020-04-30: 5 mg/h via INTRAVENOUS
  Administered 2020-04-30: 4 mg/h via INTRAVENOUS
  Administered 2020-05-01: 6 mg/h via INTRAVENOUS
  Administered 2020-05-01: 4 mg/h via INTRAVENOUS
  Filled 2020-04-24 (×17): qty 50

## 2020-04-24 MED ORDER — ASCORBIC ACID 500 MG PO TABS
500.0000 mg | ORAL_TABLET | Freq: Every day | ORAL | Status: DC
  Administered 2020-04-24 – 2020-05-01 (×8): 500 mg
  Filled 2020-04-24 (×8): qty 1

## 2020-04-24 MED ORDER — SODIUM ZIRCONIUM CYCLOSILICATE 10 G PO PACK
10.0000 g | PACK | Freq: Once | ORAL | Status: AC
  Administered 2020-04-24: 10 g
  Filled 2020-04-24: qty 1

## 2020-04-24 MED ORDER — CALCIUM GLUCONATE-NACL 1-0.675 GM/50ML-% IV SOLN
1.0000 g | Freq: Once | INTRAVENOUS | Status: AC
  Administered 2020-04-24: 1000 mg via INTRAVENOUS
  Filled 2020-04-24: qty 50

## 2020-04-24 MED ORDER — ASPIRIN 81 MG PO CHEW
81.0000 mg | CHEWABLE_TABLET | Freq: Every day | ORAL | Status: DC
  Administered 2020-04-24 – 2020-05-01 (×8): 81 mg
  Filled 2020-04-24 (×8): qty 1

## 2020-04-24 MED ORDER — DOCUSATE SODIUM 50 MG/5ML PO LIQD
100.0000 mg | Freq: Two times a day (BID) | ORAL | Status: DC
  Administered 2020-04-24 – 2020-05-01 (×15): 100 mg
  Filled 2020-04-24 (×15): qty 10

## 2020-04-24 MED ORDER — MIDAZOLAM HCL 2 MG/2ML IJ SOLN: 2.0000 mg | INTRAMUSCULAR | Status: AC | PRN

## 2020-04-24 MED ORDER — VITAL 1.5 CAL PO LIQD
1000.0000 mL | ORAL | Status: DC
  Administered 2020-04-24: 1000 mL

## 2020-04-24 MED ORDER — SEVELAMER CARBONATE 0.8 G PO PACK
0.8000 g | PACK | Freq: Three times a day (TID) | ORAL | Status: DC
  Administered 2020-04-24 – 2020-05-01 (×21): 0.8 g
  Filled 2020-04-24 (×26): qty 1

## 2020-04-24 MED ORDER — BISACODYL 10 MG RE SUPP: 10.0000 mg | Freq: Every day | RECTAL | Status: DC | PRN

## 2020-04-24 MED ORDER — NOREPINEPHRINE 16 MG/250ML-% IV SOLN
0.0000 ug/min | INTRAVENOUS | Status: DC
  Administered 2020-04-24: 20 ug/min via INTRAVENOUS
  Administered 2020-04-25 (×3): 40 ug/min via INTRAVENOUS
  Administered 2020-04-26: 29 ug/min via INTRAVENOUS
  Administered 2020-04-26: 32 ug/min via INTRAVENOUS
  Administered 2020-04-26: 28 ug/min via INTRAVENOUS
  Administered 2020-04-27: 14 ug/min via INTRAVENOUS
  Administered 2020-04-27: 34 ug/min via INTRAVENOUS
  Administered 2020-04-28: 24 ug/min via INTRAVENOUS
  Administered 2020-04-28: 40 ug/min via INTRAVENOUS
  Administered 2020-04-29: 36 ug/min via INTRAVENOUS
  Administered 2020-04-29: 20 ug/min via INTRAVENOUS
  Administered 2020-04-30: 27 ug/min via INTRAVENOUS
  Administered 2020-04-30: 30 ug/min via INTRAVENOUS
  Administered 2020-04-30: 32 ug/min via INTRAVENOUS
  Administered 2020-05-01: 17 ug/min via INTRAVENOUS
  Filled 2020-04-24 (×17): qty 250

## 2020-04-24 MED ORDER — ACETAMINOPHEN 160 MG/5ML PO SOLN
650.0000 mg | Freq: Four times a day (QID) | ORAL | Status: DC | PRN
  Filled 2020-04-24: qty 20.3

## 2020-04-24 MED ORDER — SODIUM ZIRCONIUM CYCLOSILICATE 10 G PO PACK
10.0000 g | PACK | Freq: Three times a day (TID) | ORAL | Status: DC
  Administered 2020-04-24 – 2020-04-26 (×6): 10 g
  Filled 2020-04-24 (×6): qty 1

## 2020-04-24 MED ORDER — ROCURONIUM BROMIDE 10 MG/ML (PF) SYRINGE
PREFILLED_SYRINGE | INTRAVENOUS | Status: AC
  Filled 2020-04-24: qty 10

## 2020-04-24 MED ORDER — VASOPRESSIN 20 UNITS/100 ML INFUSION FOR SHOCK
0.0000 [IU]/min | INTRAVENOUS | Status: DC
  Administered 2020-04-24 – 2020-05-01 (×15): 0.03 [IU]/min via INTRAVENOUS
  Filled 2020-04-24 (×15): qty 100

## 2020-04-24 MED ORDER — AMLODIPINE BESYLATE 10 MG PO TABS: 10.0000 mg | ORAL_TABLET | Freq: Every day | ORAL | Status: DC

## 2020-04-24 MED ORDER — CALCIUM GLUCONATE-NACL 2-0.675 GM/100ML-% IV SOLN
2.0000 g | Freq: Once | INTRAVENOUS | Status: AC
  Administered 2020-04-24: 2000 mg via INTRAVENOUS
  Filled 2020-04-24: qty 100

## 2020-04-24 MED ORDER — NOREPINEPHRINE 4 MG/250ML-% IV SOLN
2.0000 ug/min | INTRAVENOUS | Status: DC
  Administered 2020-04-24: 6 ug/min via INTRAVENOUS
  Filled 2020-04-24: qty 250

## 2020-04-24 MED ORDER — HEPARIN SODIUM (PORCINE) 5000 UNIT/ML IJ SOLN
5000.0000 [IU] | Freq: Three times a day (TID) | INTRAMUSCULAR | Status: DC
  Administered 2020-04-24 – 2020-04-29 (×13): 5000 [IU] via SUBCUTANEOUS
  Filled 2020-04-24 (×14): qty 1

## 2020-04-24 MED ORDER — POLYETHYLENE GLYCOL 3350 17 G PO PACK
17.0000 g | PACK | Freq: Every day | ORAL | Status: DC
  Administered 2020-04-24 – 2020-05-01 (×8): 17 g
  Filled 2020-04-24 (×8): qty 1

## 2020-04-24 MED ORDER — HYDROCOD POLST-CPM POLST ER 10-8 MG/5ML PO SUER: 5.0000 mL | Freq: Two times a day (BID) | ORAL | Status: DC | PRN

## 2020-04-24 MED ORDER — PROSOURCE TF PO LIQD
90.0000 mL | Freq: Four times a day (QID) | ORAL | Status: DC
  Administered 2020-04-24 – 2020-04-25 (×4): 90 mL
  Filled 2020-04-24 (×3): qty 90

## 2020-04-24 MED ORDER — CARVEDILOL 3.125 MG PO TABS: 3.1250 mg | ORAL_TABLET | Freq: Two times a day (BID) | ORAL | Status: DC

## 2020-04-24 MED ORDER — INSULIN ASPART 100 UNIT/ML ~~LOC~~ SOLN
10.0000 [IU] | Freq: Once | SUBCUTANEOUS | Status: AC
  Administered 2020-04-24: 10 [IU] via SUBCUTANEOUS

## 2020-04-24 MED ORDER — SODIUM BICARBONATE 8.4 % IV SOLN
100.0000 meq | Freq: Once | INTRAVENOUS | Status: AC
  Administered 2020-04-24: 100 meq via INTRAVENOUS
  Filled 2020-04-24: qty 50

## 2020-04-24 MED ORDER — PANTOPRAZOLE SODIUM 40 MG IV SOLR
40.0000 mg | Freq: Every day | INTRAVENOUS | Status: DC
  Administered 2020-04-24 – 2020-04-30 (×7): 40 mg via INTRAVENOUS
  Filled 2020-04-24 (×7): qty 40

## 2020-04-24 MED ORDER — ATORVASTATIN CALCIUM 80 MG PO TABS: 80.0000 mg | ORAL_TABLET | Freq: Every day | ORAL | Status: DC

## 2020-04-24 MED ORDER — PHENYLEPHRINE CONCENTRATED 100MG/250ML (0.4 MG/ML) INFUSION SIMPLE
0.0000 ug/min | INTRAVENOUS | Status: DC
  Administered 2020-04-24: 90 ug/min via INTRAVENOUS
  Filled 2020-04-24 (×2): qty 250

## 2020-04-24 MED ORDER — ROCURONIUM BROMIDE 50 MG/5ML IV SOSY
100.0000 mg | PREFILLED_SYRINGE | Freq: Once | INTRAVENOUS | Status: AC
  Administered 2020-04-24: 100 mg via INTRAVENOUS
  Filled 2020-04-24: qty 10

## 2020-04-24 MED ORDER — SODIUM CHLORIDE 0.9 % IV SOLN
2.0000 g | Freq: Two times a day (BID) | INTRAVENOUS | Status: DC
  Administered 2020-04-24 – 2020-04-27 (×6): 2 g via INTRAVENOUS
  Filled 2020-04-24 (×6): qty 2

## 2020-04-24 MED ORDER — PHENYLEPHRINE HCL-NACL 10-0.9 MG/250ML-% IV SOLN
0.0000 ug/min | INTRAVENOUS | Status: DC
  Administered 2020-04-24: 20 ug/min via INTRAVENOUS
  Administered 2020-04-24: 150 ug/min via INTRAVENOUS
  Administered 2020-04-24: 100 ug/min via INTRAVENOUS
  Administered 2020-04-24: 60 ug/min via INTRAVENOUS
  Filled 2020-04-24 (×6): qty 250

## 2020-04-24 MED ORDER — SODIUM CHLORIDE 0.9 % IV SOLN
250.0000 mL | INTRAVENOUS | Status: DC
  Administered 2020-04-24 – 2020-04-28 (×2): 250 mL via INTRAVENOUS

## 2020-04-24 MED ORDER — CISATRACURIUM BESYLATE 20 MG/10ML IV SOLN
0.1000 mg/kg | INTRAVENOUS | Status: DC | PRN
  Administered 2020-04-29: 9 mg via INTRAVENOUS
  Filled 2020-04-24 (×2): qty 10

## 2020-04-24 MED ORDER — ZINC SULFATE 220 (50 ZN) MG PO CAPS
220.0000 mg | ORAL_CAPSULE | Freq: Every day | ORAL | Status: DC
  Administered 2020-04-24 – 2020-04-25 (×2): 220 mg
  Filled 2020-04-24 (×2): qty 1

## 2020-04-24 NOTE — Progress Notes (Signed)
Pt proned at this time. No complications noted.

## 2020-04-24 NOTE — Consult Note (Signed)
Alexander Parks  HISTORY AND PHYSICAL  Alexander Parks is an 69 y.o. male.    Chief Complaint: SOB  HPI: 69 y.o.malewith HTN, HLD, CAD s/p CABG, cirrhosis of the liver, nephrolithiasis who is now seen in consultation at the request of Dr. Eulah Citizen NP for eval and recs re: AKI on CKD in the setting of COVID pneumonia. Pt was admitted 8/23 at [facility], transferred to [facility] for increasing O2 requirements.  He was on BiPaP for several days and improved and was transferred to the floor.  However, he declined again and then was eventually intubated yesterday for respiratory distress.  Developed distributive shock requiring 3 pressors now.  Becoming oliguric, hyperkalemic to 5.7-6.0.    He is on FiO2 90% and is going to be proned shortly.  In this setting we are asked to see.  Baseline Cr appears to be 1.06, has trended up to 1.31 04/23/20--> 2.31 9/10.  UOP was  1.4L yesterday and is 275 mL today.  Has been started on cefepime, cultures pending  PMH: Past Medical History:  Diagnosis Date  . Allergy   . Cirrhosis ([place])   . Coronary artery disease   . Diabetes mellitus without complication ([place])   . History of kidney stones    PSH: Past Surgical History:  Procedure Laterality Date  . BALLOON DILATION  08/21/2012   Procedure: BALLOON DILATION;  Surgeon: Marissa Nestle, MD;  Location: [facility];  Service: Urology;  Laterality: Right;  Balloon Dilation Right Ureter  . BIOPSY  01/17/2020   Procedure: BIOPSY;  Surgeon: Rogene Houston, MD;  Location: [facility];  Service: Endoscopy;;  antral esophageal  . CHOLECYSTECTOMY N/A 02/12/2020   Procedure: LAPAROSCOPIC CHOLECYSTECTOMY;  Surgeon: Virl Cagey, MD;  Location: [facility];  Service: General;  Laterality: N/A;  . COLONOSCOPY WITH PROPOFOL N/A 01/17/2020   Procedure: COLONOSCOPY WITH PROPOFOL;  Surgeon: Rogene Houston, MD;  Location[facility];  Service: Endoscopy;  Laterality: N/A;  730  . CORONARY ARTERY BYPASS GRAFT  N/A 02/08/2018   Procedure: CORONARY ARTERY BYPASS GRAFT times five, LIMA-LAD, SVG-DIAG, SVG-OM, SVG-PD-PL(SEQ), using left internal mammary artery and Endoharvest of Right greater saphenous vein;  Surgeon: Grace Isaac, MD;  Location: [facility];  Service: Open Heart Surgery;  Laterality: N/A;  . CYSTOSCOPY W/ URETERAL STENT PLACEMENT  08/21/2012   Procedure: CYSTOSCOPY WITH RETROGRADE PYELOGRAM/URETERAL STENT PLACEMENT;  Surgeon: Marissa Nestle, MD;  Location: [facility];  Service: Urology;  Laterality: Right;  Cystoscopy with Right Retrograde Pyelogram, Right Ureteral Stent Placement  . ESOPHAGOGASTRODUODENOSCOPY (EGD) WITH PROPOFOL N/A 01/17/2020   Procedure: ESOPHAGOGASTRODUODENOSCOPY (EGD) WITH PROPOFOL;  Surgeon: Rogene Houston, MD;  [facility];  Service: Endoscopy;  Laterality: N/A;  . HERNIA REPAIR    . HOLMIUM LASER APPLICATION  01/21/6294   Procedure: HOLMIUM LASER APPLICATION;  Surgeon: Marissa Nestle, MD;  Location: [facility];  Service: Urology;  Laterality: Right;  Holmium Laser Application to Right Ureteral Calculus  . INTRAOPERATIVE TRANSESOPHAGEAL ECHOCARDIOGRAM N/A 02/08/2018   Procedure: INTRAOPERATIVE TRANSESOPHAGEAL ECHOCARDIOGRAM;  Surgeon: Grace Isaac, MD;  Location: [facility] OR;  Service: Open Heart Surgery;  Laterality: N/A;  . LIVER BIOPSY N/A 02/12/2020   Procedure: LIVER BIOPSY;  Surgeon: Virl Cagey, MD;  Location: [facility];  Service: General;  Laterality: N/A;  . POLYPECTOMY  01/17/2020   Procedure: POLYPECTOMY;  Surgeon: Rogene Houston, MD;  [facility];  Service: Endoscopy;;  colon  .  RIGHT/LEFT HEART CATH AND CORONARY ANGIOGRAPHY N/A 02/07/2018   Procedure: RIGHT/LEFT HEART CATH AND CORONARY ANGIOGRAPHY;  Surgeon: Burnell Blanks, MD;  Location: [place] [facility];  Service: Cardiovascular;  Laterality: N/A;  . STONE EXTRACTION WITH BASKET  08/21/2012   Procedure: STONE EXTRACTION WITH BASKET;  Surgeon: Marissa Nestle, MD;  Location:  [facility];  Service: Urology;  Laterality: Right;  Right Ureteral Stone Pension scheme manager  . UMBILICAL HERNIA REPAIR N/A 02/12/2020   Procedure: HERNIA REPAIR UMBILICAL ADULT;  Surgeon: Virl Cagey, MD;  Location: [facility];  Service: General;  Laterality: N/A;    Past Medical History:  Diagnosis Date  . Allergy   . Cirrhosis ([place])   . Coronary artery disease   . Diabetes mellitus without complication ([place])   . History of kidney stones     Medications:   Scheduled: . vitamin C  500 mg Per Tube Daily  . aspirin  81 mg Per Tube Daily  . chlorhexidine gluconate (MEDLINE KIT)  15 mL Mouth Rinse BID  . Chlorhexidine Gluconate Cloth  6 each Topical Daily  . docusate  100 mg Per Tube BID  . feeding supplement (PROSource TF)  90 mL Per Tube QID  . heparin injection (subcutaneous)  5,000 Units Subcutaneous Q8H  . insulin aspart  0-20 Units Subcutaneous Q4H  . mouth rinse  15 mL Mouth Rinse 10 times per day  . methylPREDNISolone (SOLU-MEDROL) injection  20 mg Intravenous Q12H  . pantoprazole (PROTONIX) IV  40 mg Intravenous Daily  . polyethylene glycol  17 g Per Tube Daily  . sevelamer carbonate  0.8 g Per Tube TID WC  . zinc sulfate  220 mg Per Tube Daily    Medications Prior to Admission  Medication Sig Dispense Refill  . aspirin EC 81 MG tablet Take 1 tablet (81 mg total) by mouth daily.    Marland Kitchen atorvastatin (LIPITOR) 80 MG tablet Take 1 tablet (80 mg total) by mouth daily at 6 PM. 30 tablet 1  . carvedilol (COREG) 3.125 MG tablet TAKE 1 TABLET BY MOUTH TWICE DAILY WITH A MEAL (Patient taking differently: Take 3.125 mg by mouth 2 (two) times daily with a meal. ) 180 tablet 2  . furosemide (LASIX) 40 MG tablet Take 1 tablet (40 mg total) by mouth daily. 30 tablet 0  . metFORMIN (GLUCOPHAGE-XR) 500 MG 24 hr tablet Take 1,000 mg by mouth 2 (two) times daily.     Marland Kitchen triamcinolone cream (KENALOG) 0.1 % Apply 1 application topically in the morning, at noon, and at bedtime.     . docusate  sodium (COLACE) 100 MG capsule Take 100 mg by mouth 2 (two) times daily as needed.    . pantoprazole (PROTONIX) 40 MG tablet Take 1 tablet (40 mg total) by mouth daily before breakfast. 90 tablet 1    ALLERGIES:   Allergies  Allergen Reactions  . Benadryl [Diphenhydramine Hcl] Swelling  . Iodinated Diagnostic Agents Other (See Comments)    Unknown  . Morphine And Related Other (See Comments)    Unknown    FAM HX: Family History  Problem Relation Age of Onset  . Cancer Mother   . Cancer Sister   . Liver disease Brother     Social History:   reports that he quit smoking about 23 years ago. His smoking use included cigarettes. He has a 30.00 pack-year smoking history. He has never used smokeless tobacco. He reports that he does not drink alcohol and does not  use drugs.  ROS: ROS: unable to be obtained d/t intubation  Blood pressure (!) 141/62, pulse 82, temperature 97.7 F (36.5 C), temperature source Oral, resp. rate (!) 22, height _0  (1.88 m), weight 89.7 kg, SpO2 92 %. PHYSICAL EXAM: Physical Exam  GEN intubated, sedated HEENT eyes closed NECK no JVD PULM coarse breath sounds bilaterally diffusely CV RRR no m/r/g ABD soft, nontender EXT 1+ LE edema NEURO intubated, sedated SKIN no rashes   Results for orders placed or performed during the hospital encounter of 04/05/2020 (from the past 48 hour(s))  Glucose, capillary     Status: Abnormal   Collection Time: 04/22/20  5:28 PM  Result Value Ref Range   Glucose-Capillary 137 (H) 70 - 99 mg/dL    Comment: Glucose reference range applies only to samples taken after fasting for at least 8 hours.  Glucose, capillary     Status: Abnormal   Collection Time: 04/22/20  6:40 PM  Result Value Ref Range   Glucose-Capillary 120 (H) 70 - 99 mg/dL    Comment: Glucose reference range applies only to samples taken after fasting for at least 8 hours.  Glucose, capillary     Status: None   Collection Time: 04/22/20  7:58 PM  Result  Value Ref Range   Glucose-Capillary 95 70 - 99 mg/dL    Comment: Glucose reference range applies only to samples taken after fasting for at least 8 hours.  Glucose, capillary     Status: Abnormal   Collection Time: 04/22/20 11:55 PM  Result Value Ref Range   Glucose-Capillary 38 (LL) 70 - 99 mg/dL    Comment: Glucose reference range applies only to samples taken after fasting for at least 8 hours. REPEATED TO VERIFY, CHARGE CREDITED Performed at [facility], [place] [address], [place], [place] [address]    Comment 1 Repeat Test   Glucose, capillary     Status: Abnormal   Collection Time: 04/22/20 11:58 PM  Result Value Ref Range   Glucose-Capillary 45 (L) 70 - 99 mg/dL    Comment: Glucose reference range applies only to samples taken after fasting for at least 8 hours.  Glucose, capillary     Status: None   Collection Time: 04/23/20 12:23 AM  Result Value Ref Range   Glucose-Capillary 91 70 - 99 mg/dL    Comment: Glucose reference range applies only to samples taken after fasting for at least 8 hours.  CBC     Status: Abnormal   Collection Time: 04/23/20  2:48 AM  Result Value Ref Range   WBC 21.4 (H) 4.0 - 10.5 K/uL   RBC 4.23 4.22 - 5.81 MIL/uL   Hemoglobin 11.7 (L) 13.0 - 17.0 g/dL   HCT 35.8 (L) 39 - 52 %   MCV 84.6 80.0 - 100.0 fL   MCH 27.7 26.0 - 34.0 pg   MCHC 32.7 30.0 - 36.0 g/dL   RDW 16.8 (H) 11.5 - 15.5 %   Platelets 147 (L) 150 - 400 K/uL   nRBC 0.0 0.0 - 0.2 %    Comment: Performed at [facility] [address], [place], [place] [address]  Comprehensive metabolic panel     Status: Abnormal   Collection Time: 04/23/20  2:48 AM  Result Value Ref Range   Sodium 136 135 - 145 mmol/L   Potassium 4.2 3.5 - 5.1 mmol/L   Chloride 101 98 - 111 mmol/L   CO2 22 22 - 32 mmol/L   Glucose,  Bld 108 (H) 70 - 99 mg/dL    Comment: Glucose reference range applies only to samples taken after fasting for at least 8 hours.   BUN 119 (H) 8 - 23 mg/dL   Creatinine,  Ser 1.31 (H) 0.61 - 1.24 mg/dL   Calcium 8.3 (L) 8.9 - 10.3 mg/dL   Total Protein 6.3 (L) 6.5 - 8.1 g/dL   Albumin 2.1 (L) 3.5 - 5.0 g/dL   AST 85 (H) 15 - 41 U/L   ALT 106 (H) 0 - 44 U/L   Alkaline Phosphatase 103 38 - 126 U/L   Total Bilirubin 1.5 (H) 0.3 - 1.2 mg/dL   GFR calc non Af Amer 55 (L) >60 mL/min   GFR calc Af Amer >60 >60 mL/min   Anion gap 13 5 - 15    Comment: Performed at [facility], [place] [address] [place], [place] [address]  C-reactive protein     Status: Abnormal   Collection Time: 04/23/20  2:48 AM  Result Value Ref Range   CRP 4.7 (H) <1.0 mg/dL    Comment: Performed at [facility] [address], [place], [place] [address]  D-dimer, quantitative (not at [facility])     Status: Abnormal   Collection Time: 04/23/20  2:48 AM  Result Value Ref Range   D-Dimer, Quant 3.28 (H) 0.00 - 0.50 ug/mL-FEU    Comment: (NOTE) At the manufacturer cut-off of 0.50 ug/mL FEU, this assay has been documented to exclude PE with a sensitivity and negative predictive value of 97 to 99%.  At this time, this assay has not been approved by the FDA to exclude DVT/VTE. Results should be correlated with clinical presentation. Performed at [facility], [place] [address] [place], [place] [address]   Glucose, capillary     Status: Abnormal   Collection Time: 04/23/20  3:47 AM  Result Value Ref Range   Glucose-Capillary 63 (L) 70 - 99 mg/dL    Comment: Glucose reference range applies only to samples taken after fasting for at least 8 hours.  Glucose, capillary     Status: Abnormal   Collection Time: 04/23/20  4:06 AM  Result Value Ref Range   Glucose-Capillary 185 (H) 70 - 99 mg/dL    Comment: Glucose reference range applies only to samples taken after fasting for at least 8 hours.  Glucose, capillary     Status: Abnormal   Collection Time: 04/23/20  8:04 AM  Result Value Ref Range   Glucose-Capillary 68 (L) 70 - 99 mg/dL    Comment: Glucose reference range applies  only to samples taken after fasting for at least 8 hours.  Glucose, capillary     Status: Abnormal   Collection Time: 04/23/20  8:46 AM  Result Value Ref Range   Glucose-Capillary 124 (H) 70 - 99 mg/dL    Comment: Glucose reference range applies only to samples taken after fasting for at least 8 hours.  Glucose, capillary     Status: Abnormal   Collection Time: 04/23/20 11:51 AM  Result Value Ref Range   Glucose-Capillary 64 (L) 70 - 99 mg/dL    Comment: Glucose reference range applies only to samples taken after fasting for at least 8 hours.  Glucose, capillary     Status: Abnormal   Collection Time: 04/23/20 12:42 PM  Result Value Ref Range   Glucose-Capillary 136 (H) 70 - 99 mg/dL    Comment: Glucose reference range applies only to samples taken after fasting  for at least 8 hours.  Glucose, capillary     Status: Abnormal   Collection Time: 04/23/20  3:43 PM  Result Value Ref Range   Glucose-Capillary 100 (H) 70 - 99 mg/dL    Comment: Glucose reference range applies only to samples taken after fasting for at least 8 hours.  I-STAT 7, (LYTES, BLD GAS, ICA, H+H)     Status: Abnormal   Collection Time: 04/23/20  6:05 PM  Result Value Ref Range   pH, Arterial 7.274 (L) 7.35 - 7.45   pCO2 arterial 58.1 (H) 32 - 48 mmHg   pO2, Arterial 67 (L) 83 - 108 mmHg   Bicarbonate 26.9 20.0 - 28.0 mmol/L   TCO2 29 22 - 32 mmol/L   O2 Saturation 90.0 %   Acid-base deficit 1.0 0.0 - 2.0 mmol/L   Sodium 143 135 - 145 mmol/L   Potassium 4.0 3.5 - 5.1 mmol/L   Calcium, Ion 1.15 1.15 - 1.40 mmol/L   HCT 36.0 (L) 39 - 52 %   Hemoglobin 12.2 (L) 13.0 - 17.0 g/dL   Collection site Radial    Drawn by RT    Sample type ARTERIAL   Glucose, capillary     Status: Abnormal   Collection Time: 04/23/20  7:51 PM  Result Value Ref Range   Glucose-Capillary 151 (H) 70 - 99 mg/dL    Comment: Glucose reference range applies only to samples taken after fasting for at least 8 hours.  Magnesium     Status:  Abnormal   Collection Time: 04/23/20  9:03 PM  Result Value Ref Range   Magnesium 3.3 (H) 1.7 - 2.4 mg/dL    Comment: Performed at [facility] [address] [place], [place] [address]  Phosphorus     Status: Abnormal   Collection Time: 04/23/20  9:03 PM  Result Value Ref Range   Phosphorus 8.7 (H) 2.5 - 4.6 mg/dL    Comment: Performed at [facility] [address] [place], [place] [address]  Glucose, capillary     Status: Abnormal   Collection Time: 04/23/20 11:43 PM  Result Value Ref Range   Glucose-Capillary 182 (H) 70 - 99 mg/dL    Comment: Glucose reference range applies only to samples taken after fasting for at least 8 hours.  I-STAT 7, (LYTES, BLD GAS, ICA, H+H)     Status: Abnormal   Collection Time: 04/23/20 11:54 PM  Result Value Ref Range   pH, Arterial 7.155 (LL) 7.35 - 7.45   pCO2 arterial 75.7 (HH) 32 - 48 mmHg   pO2, Arterial 52 (L) 83 - 108 mmHg   Bicarbonate 26.5 20.0 - 28.0 mmol/L   TCO2 29 22 - 32 mmol/L   O2 Saturation 73.0 %   Acid-base deficit 4.0 (H) 0.0 - 2.0 mmol/L   Sodium 142 135 - 145 mmol/L   Potassium 5.5 (H) 3.5 - 5.1 mmol/L   Calcium, Ion 1.13 (L) 1.15 - 1.40 mmol/L   HCT 36.0 (L) 39 - 52 %   Hemoglobin 12.2 (L) 13.0 - 17.0 g/dL   Patient temperature 99.3 F    Collection site Radial    Drawn by RT    Sample type ARTERIAL   I-STAT 7, (LYTES, BLD GAS, ICA, H+H)     Status: Abnormal   Collection Time: 04/24/20 12:06 AM  Result Value Ref Range   pH, Arterial 7.177 (LL) 7.35 - 7.45   pCO2 arterial 73.3 (HH) 32 - 48 mmHg  pO2, Arterial 54 (L) 83 - 108 mmHg   Bicarbonate 27.0 20.0 - 28.0 mmol/L   TCO2 29 22 - 32 mmol/L   O2 Saturation 76.0 %   Acid-base deficit 3.0 (H) 0.0 - 2.0 mmol/L   Sodium 141 135 - 145 mmol/L   Potassium 5.4 (H) 3.5 - 5.1 mmol/L   Calcium, Ion 1.13 (L) 1.15 - 1.40 mmol/L   HCT 35.0 (L) 39 - 52 %   Hemoglobin 11.9 (L) 13.0 - 17.0 g/dL   Patient temperature 99.3 F    Collection site Radial    Drawn by  RT    Sample type ARTERIAL   Glucose, capillary     Status: Abnormal   Collection Time: 04/24/20  3:53 AM  Result Value Ref Range   Glucose-Capillary 202 (H) 70 - 99 mg/dL    Comment: Glucose reference range applies only to samples taken after fasting for at least 8 hours.  Comprehensive metabolic panel     Status: Abnormal   Collection Time: 04/24/20  5:00 AM  Result Value Ref Range   Sodium 142 135 - 145 mmol/L   Potassium 6.0 (H) 3.5 - 5.1 mmol/L   Chloride 108 98 - 111 mmol/L   CO2 19 (L) 22 - 32 mmol/L   Glucose, Bld 225 (H) 70 - 99 mg/dL    Comment: Glucose reference range applies only to samples taken after fasting for at least 8 hours.   BUN 138 (H) 8 - 23 mg/dL   Creatinine, Ser 2.15 (H) 0.61 - 1.24 mg/dL   Calcium 7.6 (L) 8.9 - 10.3 mg/dL   Total Protein 5.9 (L) 6.5 - 8.1 g/dL   Albumin 1.9 (L) 3.5 - 5.0 g/dL   AST 104 (H) 15 - 41 U/L   ALT 114 (H) 0 - 44 U/L   Alkaline Phosphatase 107 38 - 126 U/L   Total Bilirubin 1.1 0.3 - 1.2 mg/dL   GFR calc non Af Amer 30 (L) >60 mL/min   GFR calc Af Amer 35 (L) >60 mL/min   Anion gap 15 5 - 15    Comment: Performed at [facility] [address], [place], [place] [address]  D-dimer, quantitative (not at [facility])     Status: Abnormal   Collection Time: 04/24/20  5:00 AM  Result Value Ref Range   D-Dimer, Quant 3.95 (H) 0.00 - 0.50 ug/mL-FEU    Comment: (NOTE) At the manufacturer cut-off of 0.50 ug/mL FEU, this assay has been documented to exclude PE with a sensitivity and negative predictive value of 97 to 99%.  At this time, this assay has not been approved by the FDA to exclude DVT/VTE. Results should be correlated with clinical presentation. Performed at [facility], [place] [address] [place], [place] [address]   Magnesium     Status: Abnormal   Collection Time: 04/24/20  5:00 AM  Result Value Ref Range   Magnesium 3.4 (H) 1.7 - 2.4 mg/dL    Comment: Performed at [facility] [address],  [place], [place] [address]  Phosphorus     Status: Abnormal   Collection Time: 04/24/20  5:00 AM  Result Value Ref Range   Phosphorus 11.6 (H) 2.5 - 4.6 mg/dL    Comment: Performed at [facility] [address] [place],  [address]  Glucose, capillary     Status: Abnormal   Collection Time: 04/24/20  7:17 AM  Result Value Ref Range   Glucose-Capillary  210 (H) 70 - 99 mg/dL    Comment: Glucose reference range applies only to samples taken after fasting for at least 8 hours.  Glucose, capillary     Status: Abnormal   Collection Time: 04/24/20  7:58 AM  Result Value Ref Range   Glucose-Capillary 217 (H) 70 - 99 mg/dL    Comment: Glucose reference range applies only to samples taken after fasting for at least 8 hours.  CBC     Status: Abnormal   Collection Time: 04/24/20  9:13 AM  Result Value Ref Range   WBC 24.7 (H) 4.0 - 10.5 K/uL   RBC 4.24 4.22 - 5.81 MIL/uL   Hemoglobin 11.9 (L) 13.0 - 17.0 g/dL   HCT 40.0 39 - 52 %   MCV 94.3 80.0 - 100.0 fL    Comment: REPEATED TO VERIFY PT RECEIVING CONTINUOUS FLUIDS    MCH 28.1 26.0 - 34.0 pg   MCHC 29.8 (L) 30.0 - 36.0 g/dL   RDW 17.7 (H) 11.5 - 15.5 %   Platelets 165 150 - 400 K/uL   nRBC 0.0 0.0 - 0.2 %    Comment: Performed at [facility], [place] [address], [place], [place] [address]  Glucose, capillary     Status: Abnormal   Collection Time: 04/24/20 12:35 PM  Result Value Ref Range   Glucose-Capillary 229 (H) 70 - 99 mg/dL    Comment: Glucose reference range applies only to samples taken after fasting for at least 8 hours.  Basic metabolic panel     Status: Abnormal   Collection Time: 04/24/20  1:11 PM  Result Value Ref Range   Sodium 146 (H) 135 - 145 mmol/L   Potassium 5.7 (H) 3.5 - 5.1 mmol/L   Chloride 106 98 - 111 mmol/L   CO2 24 22 - 32 mmol/L   Glucose, Bld 246 (H) 70 - 99 mg/dL    Comment: Glucose reference range applies only to samples taken after fasting for at least 8 hours.   BUN 144 (H) 8 - 23  mg/dL   Creatinine, Ser 2.31 (H) 0.61 - 1.24 mg/dL   Calcium 8.0 (L) 8.9 - 10.3 mg/dL   GFR calc non Af Amer 28 (L) >60 mL/min   GFR calc Af Amer 32 (L) >60 mL/min   Anion gap 16 (H) 5 - 15    Comment: Performed at [facility] [address], [place], [place] [address]  I-STAT 7, (LYTES, BLD GAS, ICA, H+H)     Status: Abnormal   Collection Time: 04/24/20  1:55 PM  Result Value Ref Range   pH, Arterial 7.302 (L) 7.35 - 7.45   pCO2 arterial 57.5 (H) 32 - 48 mmHg   pO2, Arterial 58 (L) 83 - 108 mmHg   Bicarbonate 28.8 (H) 20.0 - 28.0 mmol/L   TCO2 31 22 - 32 mmol/L   O2 Saturation 88.0 %   Acid-Base Excess 1.0 0.0 - 2.0 mmol/L   Sodium 144 135 - 145 mmol/L   Potassium 6.0 (H) 3.5 - 5.1 mmol/L   Calcium, Ion 1.04 (L) 1.15 - 1.40 mmol/L   HCT 35.0 (L) 39 - 52 %   Hemoglobin 11.9 (L) 13.0 - 17.0 g/dL   Patient temperature 96.5 F    Collection site Magazine features editor by Operator    Sample type ARTERIAL   Glucose, capillary     Status: Abnormal   Collection Time: 04/24/20  4:24 PM  Result Value Ref  Range   Glucose-Capillary 239 (H) 70 - 99 mg/dL    Comment: Glucose reference range applies only to samples taken after fasting for at least 8 hours.    DG CHEST PORT 1 VIEW  Result Date: 04/24/2020 CLINICAL DATA:  Central line placement. EXAM: PORTABLE CHEST 1 VIEW COMPARISON:  04/24/2020. FINDINGS: Endotracheal tube, NG tube, right IJ line stable position. Prior CABG. Heart size stable. Diffuse bilateral pulmonary infiltrates/edema again noted. Slight worsening noted on today's exam. No pleural effusion or pneumothorax. Costophrenic angles incompletely imaged. IMPRESSION: 1. Interim placement right IJ line. Tip over SVC. Endotracheal tube and NG tube stable position. 2.  Prior CABG.  Heart size stable. 3. Diffuse bilateral pulmonary infiltrates/edema again noted. Slight worsening noted on today's exam. Electronically Signed   By: Marcello Moores  Register   On: 04/24/2020 11:29   DG CHEST PORT  1 VIEW  Result Date: 04/24/2020 CLINICAL DATA:  Intubation.  COVID. EXAM: PORTABLE CHEST 1 VIEW COMPARISON:  04/23/2020. FINDINGS: Endotracheal tube and NG tube in stable position. Prior CABG. Heart size normal. Diffuse bilateral pulmonary infiltrates again noted without interim change. No pleural effusion or pneumothorax. Degenerative change thoracic spine. IMPRESSION: 1.  Endotracheal tube and NG tube in stable position. 2.  Prior CABG.  Heart size normal. 3. Diffuse bilateral pulmonary infiltrates again noted without interim change. Electronically Signed   By: Marcello Moores  Register   On: 04/24/2020 05:22   DG CHEST PORT 1 VIEW  Result Date: 04/23/2020 CLINICAL DATA:  69 year old male intubated.  COVID-19. EXAM: PORTABLE CHEST 1 VIEW COMPARISON:  Portable chest 1236 hours today and earlier. FINDINGS: Portable AP semi upright view at 1735 hours. Intubated. Endotracheal tube tip in good position between the level the clavicles and carina. Enteric tube placed, side hole the level of the gastric body. Mildly improved lung volumes from earlier today. Mediastinal contours remain normal. Prior CABG. Coarse bilateral pulmonary interstitial opacity most pronounced at the lung bases appear stable since 04/20/2020. No pneumothorax or pleural effusion. Negative visible bowel gas pattern. Stable cholecystectomy clips. IMPRESSION: 1. Endotracheal tube and enteric tube in good position. 2. Stable bilateral COVID-19 pneumonia since 04/20/2020. Electronically Signed   By: Genevie Ann M.D.   On: 04/23/2020 17:51   DG CHEST PORT 1 VIEW  Result Date: 04/23/2020 CLINICAL DATA:  Respiratory failure EXAM: PORTABLE CHEST 1 VIEW COMPARISON:  Radiograph 04/20/2020 FINDINGS: Sternotomy wires overlie normal cardiac silhouette. Mild patchy bilateral airspace disease. Some increased density in the LEFT lower lobe. Improvement in RIGHT lower lobe opacities. No pneumothorax. IMPRESSION: 1. Overall minimal interval change in bilateral airspace  disease. 2. Some mild improvement in the RIGHT lower lobe and increased density LEFT lung. Electronically Signed   By: Suzy Bouchard M.D.   On: 04/23/2020 12:53   VAS [place] LOWER EXTREMITY VENOUS (DVT)  Result Date: 04/24/2020  Lower Venous DVTStudy Indications: Elevated D-dimer.  Comparison Study: No prior studies. Performing Technologist: Darlin Coco  Examination Guidelines: A complete evaluation includes B-mode imaging, spectral Doppler, color Doppler, and power Doppler as needed of all accessible portions of each vessel. Bilateral testing is considered an integral part of a complete examination. Limited examinations for reoccurring indications may be performed as noted. The reflux portion of the exam is performed with the patient in reverse Trendelenburg.  +---------+---------------+---------+-----------+----------+--------------+ RIGHT    CompressibilityPhasicitySpontaneityPropertiesThrombus Aging +---------+---------------+---------+-----------+----------+--------------+ CFV      Full           Yes      Yes                                 +---------+---------------+---------+-----------+----------+--------------+  SFJ      Full                                                        +---------+---------------+---------+-----------+----------+--------------+ FV Prox  Full                                                        +---------+---------------+---------+-----------+----------+--------------+ FV Mid   Full                                                        +---------+---------------+---------+-----------+----------+--------------+ FV DistalFull                                                        +---------+---------------+---------+-----------+----------+--------------+ PFV      Full                                                        +---------+---------------+---------+-----------+----------+--------------+ POP      Full           Yes       Yes                                 +---------+---------------+---------+-----------+----------+--------------+ PTV      Full                                                        +---------+---------------+---------+-----------+----------+--------------+ PERO     Full                                                        +---------+---------------+---------+-----------+----------+--------------+   +---------+---------------+---------+-----------+----------+--------------+ LEFT     CompressibilityPhasicitySpontaneityPropertiesThrombus Aging +---------+---------------+---------+-----------+----------+--------------+ CFV      Full           Yes      Yes                                 +---------+---------------+---------+-----------+----------+--------------+ SFJ      Full                                                        +---------+---------------+---------+-----------+----------+--------------+  FV Prox  Full                                                        +---------+---------------+---------+-----------+----------+--------------+ FV Mid   Full                                                        +---------+---------------+---------+-----------+----------+--------------+ FV DistalFull                                                        +---------+---------------+---------+-----------+----------+--------------+ PFV      Full                                                        +---------+---------------+---------+-----------+----------+--------------+ POP      Full           Yes      Yes                                 +---------+---------------+---------+-----------+----------+--------------+ PTV      Full                                                        +---------+---------------+---------+-----------+----------+--------------+ PERO     Full                                                         +---------+---------------+---------+-----------+----------+--------------+     Summary: RIGHT: - There is no evidence of deep vein thrombosis in the lower extremity.  - No cystic structure found in the popliteal fossa.  LEFT: - There is no evidence of deep vein thrombosis in the lower extremity.  - No cystic structure found in the popliteal fossa.  *See table(s) above for measurements and observations. Electronically signed by Monica Martinez MD on 04/24/2020 at 4:43:11 PM.    Final    ECHOCARDIOGRAM LIMITED  Result Date: 04/24/2020    ECHOCARDIOGRAM LIMITED REPORT   Patient Name:   CHINEDU AGUSTIN Date of Exam: 04/24/2020 Medical Rec #:  [id number]        Height:       74.0 in Accession #:    [id number]       Weight:       197.8 lb Date of Birth:  1951/02/21        BSA:          2.163 m Patient Age:  69 years         BP:           133/59 mmHg Patient Gender: M                HR:           77 bpm. Exam Location:  Inpatient Procedure: Limited Color Doppler and Cardiac Doppler Indications:    shock  History:        Patient has prior history of Echocardiogram examinations, most                 recent 06/27/2018. Prior CABG, Covid. cirrhosis; Risk                 Factors:Dyslipidemia and Diabetes.  Sonographer:    Johny Chess Referring Phys: [id number] [place]  1. Left ventricular ejection fraction, by estimation, is 60 to 65%. The left ventricle has normal function. The left ventricle has no regional wall motion abnormalities.  2. Right ventricular systolic function is normal. The right ventricular size is normal. There is normal pulmonary artery systolic pressure.  3. The mitral valve is normal in structure. No evidence of mitral valve regurgitation. No evidence of mitral stenosis.  4. The aortic valve is normal in structure. Aortic valve regurgitation is not visualized. No aortic stenosis is present. FINDINGS  Left Ventricle: Left ventricular ejection fraction, by estimation, is 60 to  65%. The left ventricle has normal function. The left ventricle has no regional wall motion abnormalities. The left ventricular internal cavity size was normal in size. Right Ventricle: The right ventricular size is normal. No increase in right ventricular wall thickness. Right ventricular systolic function is normal. There is normal pulmonary artery systolic pressure. The tricuspid regurgitant velocity is 2.59 m/s, and  with an assumed right atrial pressure of 3 mmHg, the estimated right ventricular systolic pressure is 58.5 mmHg. Mitral Valve: The mitral valve is normal in structure. No evidence of mitral valve stenosis. Tricuspid Valve: The tricuspid valve is normal in structure. Tricuspid valve regurgitation is trivial. Aortic Valve: The aortic valve is normal in structure. Aortic valve regurgitation is not visualized. No aortic stenosis is present. Pulmonic Valve: The pulmonic valve was normal in structure. Pulmonic valve regurgitation is not visualized. LEFT VENTRICLE PLAX 2D LVIDd:         4.90 cm  Diastology LVIDs:         2.60 cm  LV e' medial:    7.07 cm/s LV PW:         0.90 cm  LV E/e' medial:  8.7 LV IVS:        1.00 cm  LV e' lateral:   11.10 cm/s LVOT diam:     2.10 cm  LV E/e' lateral: 5.6 LV SV:         56 LV SV Index:   26 LVOT Area:     3.46 cm  IVC IVC diam: 2.10 cm LEFT ATRIUM         Index LA diam:    3.10 cm 1.43 cm/m  AORTIC VALVE LVOT Vmax:   73.10 cm/s LVOT Vmean:  50.000 cm/s LVOT VTI:    0.161 m  AORTA Ao Root diam: 3.90 cm Ao Asc diam:  3.60 cm MITRAL VALVE               TRICUSPID VALVE MV Area (PHT): 3.27 cm    TR Peak grad:   26.8 mmHg MV Decel Time: 232 msec  TR Vmax:        259.00 cm/s MV E velocity: 61.70 cm/s MV A velocity: 70.70 cm/s  SHUNTS MV E/A ratio:  0.87        Systemic VTI:  0.16 m                            Systemic Diam: 2.10 cm Mertie Moores MD Electronically signed by Mertie Moores MD Signature Date/Time: 04/24/2020/3:42:03 PM    Final    [place] EKG SITE  RITE  Result Date: 04/24/2020 If Site Rite image not attached, placement could not be confirmed due to current cardiac rhythm.   Assessment/Plan 1.  AKI: secondary to distributive shock (septic).  Cr 1.01-->2.3 with some hyperkalemia.  Azotemic to 144.  Will need RRT in the next 12-24 hours if doesn't turn around.  I think if we can temporize K we have time to prone him for lung recruitment maneuvers and then place HD catheter and start CRRT when supine again.    2.  Acute hypoxic RF 2/2 COVID pneumonia: got remdesivir, baricitinib, steroid taper.  On Cefepime.  Vented per PCCM, proning this afternoon  3.  Hyperkalemia: up to 5.7/ 6.0.  S/p Ca gluconate, bicarb, insulin/ dextrose, one dose of lokelma.  Increase lokelma to 10 TID.  Recommend changing tube feeds to nepro  4.  Shock: likely septic/ distributive, TTE without evidence of PE.  Cultures/ pressors/ antibiotics per PCCM.    5.  Dispo: ICU  Keenon Leitzel, [place] 04/24/2020, 5:06 PM

## 2020-04-24 NOTE — Procedures (Signed)
Arterial Catheter Insertion Procedure Note  TAYTUM WHELLER  [id number]  10/06/1950  Date:04/24/20  Time:9:53 AM    Provider Performing: Cordella Register    Procedure: Insertion of Arterial Line [id number]) without US guidance  Indication(s) Blood pressure monitoring and/or need for frequent ABGs  Consent Unable to obtain consent due to emergent nature of procedure.  Anesthesia None   Time Out Verified patient identification, verified procedure, site/side was marked, verified correct patient position, special equipment/implants available, medications/allergies/relevant history reviewed, required imaging and test results available.   Sterile Technique Maximal sterile technique including full sterile barrier drape, hand hygiene, sterile gown, sterile gloves, mask, hair covering, sterile ultrasound probe cover (if used).   Procedure Description Area of catheter insertion was cleaned with chlorhexidine and draped in sterile fashion. Without real-time ultrasound guidance an arterial catheter was placed into the right radial artery.  Appropriate arterial tracings confirmed on monitor.     Complications/Tolerance None; patient tolerated the procedure well.   EBL Minimal   Specimen(s) None

## 2020-04-24 NOTE — Progress Notes (Signed)
Lower extremity venous bilateral study completed  Preliminary results relayed to RN at bedside.  See CV Proc for preliminary results report.   Darlin Coco

## 2020-04-24 NOTE — Progress Notes (Signed)
Clinical updates provided to pt's wife -- worsening shock, likely plan for proning, AKI with hyperkalemia. I expressed concern for pt's rapid decline and we discussed GOC.  Pt wife understandably feels in shock at her husband's decline over the past 24-48 hrs, and at this time wishes for continued aggressive care.  I will consult palliative care to assist in establishing [place].   Eliseo Gum MSN, AGACNP-BC [place] [phone] If no answer, [phone] 04/24/2020, 5:14 PM

## 2020-04-24 NOTE — Progress Notes (Signed)
In correspondence with Dr. Lucile Shutters via Warren Lacy RN, Elzie Rings, concerning pt's hypotension trend & worsening acidosis.  Awaiting new orders.

## 2020-04-24 NOTE — Progress Notes (Signed)
OT Cancellation Note  Patient Details Name: Alexander Parks MRN: [id number] DOB: [birth year]   Cancelled Treatment:    Reason Eval/Treat Not Completed: Patient not medically ready (Intubated and sedated. Will return as pt medically stable. Thank you.)  [place], OTR/L Acute Rehab Pager: [phone] Office: [phone] 04/24/2020, 7:28 AM

## 2020-04-24 NOTE — Progress Notes (Signed)
NAME:  Alexander Parks, MRN:  [id number], DOB:  12/07/1950, LOS: 39 ADMISSION DATE:  03/26/2020, CONSULTATION DATE: 04/23/2020 REFERRING MD: Elgergawy-TRH, CHIEF COMPLAINT: Hypoxia  HPI/course in hospital  69 year old man with worsening hypoxia related to COVID-19 pneumonia.  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  Admitted to [facility] 8/23.  Transfer to ICU [facility] 8/31 required BiPAP for several days then stabilized.  Transferred back to hospitalist service 9/3. Returns for worsening hypoxia started on BiPAP overnight.   Past Medical History   Past Medical History:  Diagnosis Date  . Allergy   . Cirrhosis ([place])   . Coronary artery disease   . Diabetes mellitus without complication ([place])   . History of kidney stones      Past Surgical History:  Procedure Laterality Date  . BALLOON DILATION  08/21/2012   Procedure: BALLOON DILATION;  Surgeon: Marissa Nestle, MD;  Location: [facility];  Service: Urology;  Laterality: Right;  Balloon Dilation Right Ureter  . BIOPSY  01/17/2020   Procedure: BIOPSY;  Surgeon: Rogene Houston, MD;  Location: [facility];  Service: Endoscopy;;  antral esophageal  . CHOLECYSTECTOMY N/A 02/12/2020   Procedure: LAPAROSCOPIC CHOLECYSTECTOMY;  Surgeon: Virl Cagey, MD;  Location: [facility];  Service: General;  Laterality: N/A;  . COLONOSCOPY WITH PROPOFOL N/A 01/17/2020   Procedure: COLONOSCOPY WITH PROPOFOL;  Surgeon: Rogene Houston, MD;  Location[facility];  Service: Endoscopy;  Laterality: N/A;  730  . CORONARY ARTERY BYPASS GRAFT N/A 02/08/2018   Procedure: CORONARY ARTERY BYPASS GRAFT times five, LIMA-LAD, SVG-DIAG, SVG-OM, SVG-PD-PL(SEQ), using left internal mammary artery and Endoharvest of Right greater saphenous vein;  Surgeon: Lori Popowski Isaac, MD;  Location: [facility];  Service: Open Heart Surgery;  Laterality: N/A;  . CYSTOSCOPY W/  URETERAL STENT PLACEMENT  08/21/2012   Procedure: CYSTOSCOPY WITH RETROGRADE PYELOGRAM/URETERAL STENT PLACEMENT;  Surgeon: Marissa Nestle, MD;  Location: [facility];  Service: Urology;  Laterality: Right;  Cystoscopy with Right Retrograde Pyelogram, Right Ureteral Stent Placement  . ESOPHAGOGASTRODUODENOSCOPY (EGD) WITH PROPOFOL N/A 01/17/2020   Procedure: ESOPHAGOGASTRODUODENOSCOPY (EGD) WITH PROPOFOL;  Surgeon: Rogene Houston, MD;  [facility];  Service: Endoscopy;  Laterality: N/A;  . HERNIA REPAIR    . HOLMIUM LASER APPLICATION  10/18/7015   Procedure: HOLMIUM LASER APPLICATION;  Surgeon: Marissa Nestle, MD;  Location: [facility];  Service: Urology;  Laterality: Right;  Holmium Laser Application to Right Ureteral Calculus  . INTRAOPERATIVE TRANSESOPHAGEAL ECHOCARDIOGRAM N/A 02/08/2018   Procedure: INTRAOPERATIVE TRANSESOPHAGEAL ECHOCARDIOGRAM;  Surgeon: Cimberly Stoffel Isaac, MD;  Location: [facility];  Service: Open Heart Surgery;  Laterality: N/A;  . LIVER BIOPSY N/A 02/12/2020   Procedure: LIVER BIOPSY;  Surgeon: Virl Cagey, MD;  Location: [facility];  Service: General;  Laterality: N/A;  . POLYPECTOMY  01/17/2020   Procedure: POLYPECTOMY;  Surgeon: Rogene Houston, MD;  [facility];  Service: Endoscopy;;  colon  . RIGHT/LEFT HEART CATH AND CORONARY ANGIOGRAPHY N/A 02/07/2018   Procedure: RIGHT/LEFT HEART CATH AND CORONARY ANGIOGRAPHY;  Surgeon: Burnell Blanks, MD;  Location: [facility];  Service: Cardiovascular;  Laterality: N/A;  . STONE EXTRACTION WITH BASKET  08/21/2012   Procedure: STONE EXTRACTION WITH BASKET;  Surgeon: Marissa Nestle, MD;  Location: [facility];  Service: Urology;  Laterality: Right;  Right Ureteral Stone Pension scheme manager  .  UMBILICAL HERNIA REPAIR N/A 02/12/2020   Procedure: HERNIA REPAIR UMBILICAL ADULT;  Surgeon: Virl Cagey, MD;  Location: [facility];  Service: General;  Laterality: N/A;     Interim history/subjective:   Intubated  9/9 Overnight was dyssynchronous with vent + respiratory acidosis. Patient was given 1 amp bicarb and received 1x push of roc  RR increased to 35  Has been on neo overnight for hypotension, this morning this dose is rising  Pt with worsening hypoxia this morning, with SpO2 77% at 0500. This has improved to 87% at 0800  AM labs are pending 9/10   POCGlucose elevated >200   Objective   Blood pressure (!) 100/51, pulse 80, temperature (!) 96.8 F (36 C), temperature source Axillary, resp. rate (!) 35, height 6\' 2"  (1.88 m), weight 89.7 kg, SpO2 90 %.    Vent Mode: PRVC FiO2 (%):  [100 %] 100 % Set Rate:  [30 bmp-35 bmp] 35 bmp Vt Set:  [490 mL] 490 mL PEEP:  [15 cmH20] 15 cmH20 Plateau Pressure:  [35 cmH20-44 cmH20] 35 cmH20   Intake/Output Summary (Last 24 hours) at 04/24/2020 0836 Last data filed at 04/24/2020 0631 Gross per 24 hour  Intake 2622.62 ml  Output 1210 ml  Net 1412.62 ml   Filed Weights   04/09/20 0400 04/10/20 0500 04/24/20 0500  Weight: 96.6 kg 95.9 kg 89.7 kg     Physical Exam  General: Critically ill appearing older adult M, intubated sedated NAD  Neuro: Sedated. 15mm pupils, sluggish. Does not follow commands HEENT: NCAT ETT secure pink mmm trachea midline anicteric sclera Pulm: Coarse bilaterally. Symmetrical chest expansion. Mechanically ventilated  CV: RRR s1s2 no rgm cap refill >3 seconds  GI: soft flat ndnt  GU: clear yellow urine Extremity: No obvious joint deformity. Distal lower extremities with mild cyanosis. Trace edema Skin: pale, c/d/cool to touch   Ancillary tests (personally reviewed)  CBC: Recent Labs  Lab 04/18/20 0508 04/18/20 0508 04/19/20 0238 04/19/20 0238 04/20/20 0251 04/20/20 0251 04/21/20 0345 04/23/20 0248 04/23/20 1805 04/23/20 2354 04/24/20 0006  WBC 20.2*  --  17.3*  --  16.6*  --  19.0* 21.4*  --   --   --   NEUTROABS 17.6*  --  15.7*  --  14.7*  --  16.6*  --   --   --   --   HGB 13.1   < > 12.2*   < > 12.6*   <  > 12.2* 11.7* 12.2* 12.2* 11.9*  HCT 40.4   < > 37.1*   < > 38.1*   < > 37.0* 35.8* 36.0* 36.0* 35.0*  MCV 84.2  --  83.6  --  84.1  --  84.3 84.6  --   --   --   PLT 174  --  143*  --  157  --  185 147*  --   --   --    < > = values in this interval not displayed.    Basic Metabolic Panel: Recent Labs  Lab 04/18/20 0508 04/18/20 0508 04/19/20 0238 04/19/20 0238 04/20/20 0251 04/20/20 0251 04/21/20 0345 04/23/20 0248 04/23/20 1805 04/23/20 2103 04/23/20 2354 04/24/20 0006  NA 131*   < > 133*   < > 133*   < > 132* 136 143  --  142 141  K 4.1   < > 4.7   < > 4.3   < > 4.1 4.2 4.0  --  5.5* 5.4*  CL 95*  --  99  --  98  --  96* 101  --   --   --   --   CO2 25  --  26  --  24  --  24 22  --   --   --   --   GLUCOSE 143*  --  146*  --  197*  --  106* 108*  --   --   --   --   BUN 57*  --  55*  --  75*  --  90* 119*  --   --   --   --   CREATININE 1.33*  --  1.06  --  1.21  --  1.25* 1.31*  --   --   --   --   CALCIUM 8.8*  --  8.8*  --  8.9  --  8.6* 8.3*  --   --   --   --   MG 2.3  --  2.3  --  2.5*  --  2.5*  --   --  3.3*  --   --   PHOS  --   --   --   --   --   --   --   --   --  8.7*  --   --    < > = values in this interval not displayed.   GFR: Estimated Creatinine Clearance: 61.9 mL/min (A) (by C-G formula based on SCr of 1.31 mg/dL (H)). Recent Labs  Lab 04/19/20 0238 04/20/20 0251 04/21/20 0345 04/22/20 0543 04/23/20 0248  PROCALCITON 0.11 <0.10 <0.10 0.10  --   WBC 17.3* 16.6* 19.0*  --  21.4*    Liver Function Tests: Recent Labs  Lab 04/18/20 0508 04/19/20 0238 04/20/20 0251 04/21/20 0345 04/23/20 0248  AST 57* 64* 59* 76* 85*  ALT 77* 79* 82* 94* 106*  ALKPHOS 88 85 103 102 103  BILITOT 1.9* 1.8* 1.2 1.8* 1.5*  PROT 6.9 6.5 6.5 6.3* 6.3*  ALBUMIN 2.2* 2.0* 2.0* 2.1* 2.1*   No results for input(s): LIPASE, AMYLASE in the last 168 hours. No results for input(s): AMMONIA in the last 168 hours.  ABG    Component Value Date/Time   PHART 7.177  (LL) 04/24/2020 0006   PCO2ART 73.3 (HH) 04/24/2020 0006   PO2ART 54 (L) 04/24/2020 0006   HCO3 27.0 04/24/2020 0006   TCO2 29 04/24/2020 0006   ACIDBASEDEF 3.0 (H) 04/24/2020 0006   O2SAT 76.0 04/24/2020 0006     Coagulation Profile: No results for input(s): INR, PROTIME in the last 168 hours.  Cardiac Enzymes: No results for input(s): CKTOTAL, CKMB, CKMBINDEX, TROPONINI in the last 168 hours.  HbA1C: Hemoglobin A1C  Date/Time Value Ref Range Status  04/29/2014 03:24 PM 8.8 (H) 4.2 - 6.3 % Final    Comment:    The American Diabetes Association recommends that a primary goal of therapy should be <7% and that physicians should reevaluate the treatment regimen in patients with HbA1c values consistently >8%.    Hgb A1c MFr Bld  Date/Time Value Ref Range Status  03/17/2020 11:59 AM 7.9 (H) 4.8 - 5.6 % Final    Comment:    (NOTE)         Prediabetes: 5.7 - 6.4         Diabetes: >6.4         Glycemic control for adults with diabetes: <7.0   02/10/2020 09:32 AM 7.3 (H) 4.8 - 5.6 % Final  Comment:    (NOTE) Pre diabetes:          5.7%-6.4%  Diabetes:              >6.4%  Glycemic control for   <7.0% adults with diabetes     CBG: Recent Labs  Lab 04/23/20 1951 04/23/20 2343 04/24/20 0353 04/24/20 0717 04/24/20 0758  GLUCAP 151* 182* 202* 210* 217*     Assessment & Plan:   Acute hypoxic respiratory failure due to COVID-19 PNA  -likely fibrotic component given late stage from initial presentation  As such prognosis for recovery is less favorable and likely to be slow.  He would likely require prolonged mechanical ventilation and tracheostomy and he was advised of this 9/9 -Continue MV -VAP, PAD -s/p remdesivir, baricitinib -steroid taper   Shock -with resp acidosis, wonder if some is mediated by acidosis? With concomitant worsening hypoxia also wonder if PE? Ddimer somewhat elevated (increased with decompensation) but has been on lovenox. 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 Fluid status goals: euvolemic to net negative  Urinary catheter: foley Central lines: Peripheral IVs only Glucose control: SSI Mobility/therapy needs: BR  Antibiotic de-escalation: No antibiotics Home medication reconciliation: holding home antihypertensives in setting of shock  Daily labs: CBC, BMP Code Status: Full code Family Communication:  D/w wife 9/10 Disposition: ICU   CRITICAL CARE Performed by: Cristal Generous   Total critical care time: 55 minutes  Critical care time was exclusive of separately billable procedures and treating other patients.  Critical care was necessary to treat or prevent imminent or life-threatening deterioration.  Critical care was time spent personally by me on the following activities: development of treatment plan with patient and/or surrogate as well as nursing, discussions with consultants, evaluation of patient's response to treatment, examination of patient, obtaining  history from patient or surrogate, ordering and performing treatments and interventions, ordering and review of laboratory studies, ordering and review of radiographic studies, pulse oximetry and re-evaluation of patient's condition.   Eliseo Gum MSN, AGACNP-BC [place] [phone] If no answer, [phone] 04/24/2020, 9:24 AM

## 2020-04-24 NOTE — Procedures (Signed)
Cortrak  Person Inserting Tube:  Alexander Parks, RD Tube Type:  Cortrak - 43 inches Tube Location:  Right nare Initial Placement:  Stomach Secured by: Bridle Technique Used to Measure Tube Placement:  Documented cm marking at nare/ corner of mouth Cortrak Secured At:  80 cm   Cortrak Tube Team Note:  Consult received to place a Cortrak feeding tube.   No x-ray is required. RN may begin using tube.   If the tube becomes dislodged please keep the tube and contact the Cortrak team at [email or web address] (password [email or web address]) for replacement.  If after hours and replacement cannot be delayed, place a NG tube and confirm placement with an abdominal x-ray.    Mariana Single RD, LDN Clinical Nutrition Pager listed in [place]

## 2020-04-24 NOTE — Procedures (Signed)
Central Venous Catheter Insertion Procedure Note  Alexander Parks  [id number]  April 15, 1951  Date:04/24/20  Time:11:21 AM   Provider Performing:Amybeth Sieg E [place] Pech[place]k   Procedure: Insertion of Non-tunneled Central Venous [phone]) with US guidance ([id number])   Indication(s) Medication administration  Consent Risks of the procedure as well as the alternatives and risks of each were explained to the patient and/or caregiver.  Consent for the procedure was obtained and is signed in the bedside chart  Anesthesia continuous fentanyl, versed  Timeout Verified patient identification, verified procedure, site/side was marked, verified correct patient position, special equipment/implants available, medications/allergies/relevant history reviewed, required imaging and test results available.  Sterile Technique Maximal sterile technique including full sterile barrier drape, hand hygiene, sterile gown, sterile gloves, mask, hair covering, sterile ultrasound probe cover (if used).  Procedure Description Area of catheter insertion was cleaned with chlorhexidine and draped in sterile fashion.  With real-time ultrasound guidance a central venous catheter was placed into the right internal jugular vein. Nonpulsatile blood flow and easy flushing noted in all ports.  The catheter was sutured in place and sterile dressing applied.  Complications/Tolerance None; patient tolerated the procedure well. Chest X-ray is ordered to verify placement for internal jugular cannulation and is pending.   EBL Minimal  Specimen(s) None   Eliseo Gum MSN, AGACNP-BC [place] [phone] If no answer, [phone] 04/24/2020, 11:21 AM

## 2020-04-24 NOTE — Progress Notes (Signed)
Initial Nutrition Assessment  DOCUMENTATION CODES:   Not applicable (High Nutritional Risk)  INTERVENTION:   Tube Feeding via Cortrak: Initiate Vital 1.5 at 20 ml/hr; monitor clinical status, renal function  Goal TF prescription pending renal function, clinical status: Vital 1.5 at 60 ml/hr Pro-Source TF 45 mL QID Provides 144 g of protein, 2320 kcals and 1094 mL of free water  NUTRITION DIAGNOSIS:   Increased nutrient needs related to acute illness as evidenced by estimated needs.  GOAL:   Patient will meet greater than or equal to 90% of their needs  MONITOR:   Vent status, Labs, Weight trends, TF tolerance, Skin  REASON FOR ASSESSMENT:   Consult, Ventilator Enteral/tube feeding initiation and management  ASSESSMENT:   69 yo male admitted on 8/23 with acute respiratory failure related to COVID 19 pna, worsening with likely fibrotic component, requiring transfer to ICU on 9/9 with  intubation, shock requiring pressors, AKI with electrolyte abnormalities. PMH includes CAD/CABG x 5, cirrhosis, CHF, DM  8/23 Admitted  9/09 Intubated 9/10 Cortrak placed  Pt sedated on vent support; on fentanyl and versed gtt. Requiring phenylephrine with increasing dosage; MAP previously <65 with cuff, improved with increase in pressor support. Dyssynchronous with vent overnight OG tube in stomach per chest xray  Pt had been eating ok up until 9/8. Unable to obtain diet and weight history from patient at this time  Current weight 89.7 kg; admit weight 96.6 kg; based on weight encounters pt has experienced weight loss with weights around 102-103 kg 1 month ago. Potential weight loss of 12% in 1 month. Pt at high nutritional risk. Suspect pt is malnourished but will need nutrition focused physical exam to confirm; plan to obtain on follow as able   Worsening renal function(non oliguric) with hyperkalemia and hyperphosphatemia. No RRT at this time  CBGs >200 but also on D5 infusion.    Labs: sodium 141 (wdl), potassium 6.0 (H), CBGs 202-217 (ICU goal 140-180), BUN 131 (H), Creatinine 2.15 (H), phosphorus 11.6 (H) Meds:  D5-NS at 75 ml/hr, colace, Vit C,  ss novolog, solumedrol, zinc sulfate                                    Diet Order:   Diet Order            Diet NPO time specified  Diet effective now                 EDUCATION NEEDS:   Not appropriate for education at this time  Skin:  Skin Assessment: Skin Integrity Issues: Skin Integrity Issues:: Stage II Stage II: ear  Last BM:  9/08  Height:   Ht Readings from Last 1 Encounters:  04/07/20 6\' 2"  (1.88 m)    Weight:   Wt Readings from Last 1 Encounters:  04/24/20 89.7 kg   BMI:  Body mass index is 25.39 kg/m.  Estimated Nutritional Needs:   Kcal:  2250-2700 kcals  Protein:  115-150 g  Fluid:  >/= 2 L    Kerman Passey MS, RDN, LDN, CNSC Registered Dietitian III Clinical Nutrition RD Pager and On-Call Pager Number Located in [place]

## 2020-04-24 NOTE — Progress Notes (Signed)
  Echocardiogram 2D Echocardiogram has been performed.  Alexander Parks 04/24/2020, 3:07 PM

## 2020-04-24 NOTE — Progress Notes (Signed)
[facility] Progress Note Patient Name: Alexander Parks DOB: 11/09/1950 MRN: [id number]   Date of Service  04/24/2020  HPI/Events of Note  Respiratory acidosis, patient is dyssynchronous on the ventilator, hypotension likely aggravated by acidosis.  eICU Interventions  Will sedate and paralyze patient to optimize ventilation, Phenylephrine for hypotension, will give 1 amp of sodium bicarbonate for hemodynamically significant acidosis pending optimization of his ventilation and correction of respiratory acidosis.        [name] [name] 04/24/2020, 1:08 AM

## 2020-04-24 NOTE — Progress Notes (Signed)
PT Cancellation Note  Patient Details Name: Alexander Parks MRN: [id number] DOB: 03-16-1951   Cancelled Treatment:    Reason Eval/Treat Not Completed: Medical issues which prohibited therapy. Pt now intubated and sedated. PT to hold. Will follow up Monday to re-assess ability to participate.   Lorriane Shire 04/24/2020, 8:34 AM  Lorrin Goodell, PT  Office # [phone] Pager [phone]

## 2020-04-24 NOTE — Progress Notes (Addendum)
  PCCM brief progress note  69yo M with hypoxic respiratory failure in setting of COVID-19 PNA who is critically ill: intubated, in shock on pressors, with AKI and hyperkalemia.   AKI, Hyperkalemia -K remains elevated at 5.7 -ionized calcium noted to be low  P Kayexalate 2g Calcium 1 amp Bicarb 10 units novolog Follow up BMP, trend UOP-- has been decreasing this shift. May need nephro consult depending on response to above interventions  Shock -doesn't seem likely PE or cardiogenic shock. ? Sedation related although would not expect pressor requirement to increase progressively. Acidosis improving. ?septic shock  W/ leukocytosis (although is on steroids) low temp  P -bcx, tracheal aspirate -empiric cefepime   -MAP goal > 65   ARDS in setting of COVID-19 -checking follow up ABG -may need prone positioning      Additional critical care time 30 minutes   Eliseo Gum MSN, AGACNP-BC [place] [phone] If no answer, [phone] 04/24/2020, 2:23 PM  ________________________________________ Addendum 18:31 04/24/20  ARDS ABG reviewed, pf 62 -- will prone 16:8 prone/supinate cycle RASS -4 -5 PRN nimbex   AKI with azotemia, hyperkalemia Nephrology consulted, will try temporizing hyperkalemia -- likely heading toward CRRT  Repeat BMP [place] MSN, AGACNP-BC [place] [phone] If no answer, [phone] 04/24/2020, 6:31 PM

## 2020-04-25 ENCOUNTER — Inpatient Hospital Stay (HOSPITAL_COMMUNITY): Payer: Medicare HMO

## 2020-04-25 DIAGNOSIS — A419 Sepsis, unspecified organism: Secondary | ICD-10-CM

## 2020-04-25 DIAGNOSIS — J181 Lobar pneumonia, unspecified organism: Secondary | ICD-10-CM

## 2020-04-25 DIAGNOSIS — Z515 Encounter for palliative care: Secondary | ICD-10-CM

## 2020-04-25 DIAGNOSIS — R6521 Severe sepsis with septic shock: Secondary | ICD-10-CM

## 2020-04-25 DIAGNOSIS — Z9911 Dependence on respirator [ventilator] status: Secondary | ICD-10-CM

## 2020-04-25 LAB — COMPREHENSIVE METABOLIC PANEL
ALT: 137 U/L — ABNORMAL HIGH (ref 0–44)
AST: 95 U/L — ABNORMAL HIGH (ref 15–41)
Albumin: 1.8 g/dL — ABNORMAL LOW (ref 3.5–5.0)
Alkaline Phosphatase: 151 U/L — ABNORMAL HIGH (ref 38–126)
Anion gap: 12 (ref 5–15)
BUN: 155 mg/dL — ABNORMAL HIGH (ref 8–23)
CO2: 27 mmol/L (ref 22–32)
Calcium: 8.1 mg/dL — ABNORMAL LOW (ref 8.9–10.3)
Chloride: 109 mmol/L (ref 98–111)
Creatinine, Ser: 2.13 mg/dL — ABNORMAL HIGH (ref 0.61–1.24)
GFR calc Af Amer: 36 mL/min — ABNORMAL LOW (ref >60)
GFR calc non Af Amer: 31 mL/min — ABNORMAL LOW (ref >60)
Glucose, Bld: 146 mg/dL — ABNORMAL HIGH (ref 70–99)
Potassium: 4.6 mmol/L (ref 3.5–5.1)
Sodium: 148 mmol/L — ABNORMAL HIGH (ref 135–145)
Total Bilirubin: 1.1 mg/dL (ref 0.3–1.2)
Total Protein: 6 g/dL — ABNORMAL LOW (ref 6.5–8.1)

## 2020-04-25 LAB — BASIC METABOLIC PANEL
Anion gap: 12 (ref 5–15)
BUN: 155 mg/dL — ABNORMAL HIGH (ref 8–23)
CO2: 28 mmol/L (ref 22–32)
Calcium: 8 mg/dL — ABNORMAL LOW (ref 8.9–10.3)
Chloride: 110 mmol/L (ref 98–111)
Creatinine, Ser: 2.12 mg/dL — ABNORMAL HIGH (ref 0.61–1.24)
GFR calc Af Amer: 36 mL/min — ABNORMAL LOW (ref >60)
GFR calc non Af Amer: 31 mL/min — ABNORMAL LOW (ref >60)
Glucose, Bld: 168 mg/dL — ABNORMAL HIGH (ref 70–99)
Potassium: 4.5 mmol/L (ref 3.5–5.1)
Sodium: 150 mmol/L — ABNORMAL HIGH (ref 135–145)

## 2020-04-25 LAB — POCT I-STAT 7, (LYTES, BLD GAS, ICA,H+H)
Acid-Base Excess: 2 mmol/L (ref 0.0–2.0)
Acid-Base Excess: 3 mmol/L — ABNORMAL HIGH (ref 0.0–2.0)
Bicarbonate: 31.5 mmol/L — ABNORMAL HIGH (ref 20.0–28.0)
Bicarbonate: 31.2 mmol/L — ABNORMAL HIGH (ref 20.0–28.0)
Calcium, Ion: 1.13 mmol/L — ABNORMAL LOW (ref 1.15–1.40)
Calcium, Ion: 1.1 mmol/L — ABNORMAL LOW (ref 1.15–1.40)
HCT: 32 % — ABNORMAL LOW (ref 39.0–52.0)
HCT: 30 % — ABNORMAL LOW (ref 39.0–52.0)
Hemoglobin: 10.9 g/dL — ABNORMAL LOW (ref 13.0–17.0)
Hemoglobin: 10.2 g/dL — ABNORMAL LOW (ref 13.0–17.0)
O2 Saturation: 80 %
O2 Saturation: 92 %
Patient temperature: 97.9
Potassium: 4.3 mmol/L (ref 3.5–5.1)
Potassium: 4.4 mmol/L (ref 3.5–5.1)
Sodium: 147 mmol/L — ABNORMAL HIGH (ref 135–145)
Sodium: 148 mmol/L — ABNORMAL HIGH (ref 135–145)
TCO2: 34 mmol/L — ABNORMAL HIGH (ref 22–32)
TCO2: 33 mmol/L — ABNORMAL HIGH (ref 22–32)
pCO2 arterial: 73.8 mmHg (ref 32.0–48.0)
pCO2 arterial: 70.9 mmHg (ref 32.0–48.0)
pH, Arterial: 7.236 — ABNORMAL LOW (ref 7.350–7.450)
pH, Arterial: 7.252 — ABNORMAL LOW (ref 7.350–7.450)
pO2, Arterial: 53 mmHg — ABNORMAL LOW (ref 83.0–108.0)
pO2, Arterial: 77 mmHg — ABNORMAL LOW (ref 83.0–108.0)

## 2020-04-25 LAB — GLUCOSE, CAPILLARY
Glucose-Capillary: 140 mg/dL — ABNORMAL HIGH (ref 70–99)
Glucose-Capillary: 140 mg/dL — ABNORMAL HIGH (ref 70–99)
Glucose-Capillary: 151 mg/dL — ABNORMAL HIGH (ref 70–99)
Glucose-Capillary: 180 mg/dL — ABNORMAL HIGH (ref 70–99)
Glucose-Capillary: 225 mg/dL — ABNORMAL HIGH (ref 70–99)
Glucose-Capillary: 248 mg/dL — ABNORMAL HIGH (ref 70–99)

## 2020-04-25 LAB — CBC
HCT: 36.5 % — ABNORMAL LOW (ref 39.0–52.0)
Hemoglobin: 11 g/dL — ABNORMAL LOW (ref 13.0–17.0)
MCH: 27.8 pg (ref 26.0–34.0)
MCHC: 30.1 g/dL (ref 30.0–36.0)
MCV: 92.2 fL (ref 80.0–100.0)
Platelets: 173 10*3/uL (ref 150–400)
RBC: 3.96 MIL/uL — ABNORMAL LOW (ref 4.22–5.81)
RDW: 17.6 % — ABNORMAL HIGH (ref 11.5–15.5)
WBC: 25.1 10*3/uL — ABNORMAL HIGH (ref 4.0–10.5)
nRBC: 0 % (ref 0.0–0.2)

## 2020-04-25 LAB — MAGNESIUM: Magnesium: 3.6 mg/dL — ABNORMAL HIGH (ref 1.7–2.4)

## 2020-04-25 LAB — PHOSPHORUS: Phosphorus: 6.9 mg/dL — ABNORMAL HIGH (ref 2.5–4.6)

## 2020-04-25 MED ORDER — PROSOURCE TF PO LIQD
45.0000 mL | Freq: Four times a day (QID) | ORAL | Status: DC
  Administered 2020-04-25 – 2020-05-01 (×25): 45 mL
  Filled 2020-04-25 (×23): qty 45

## 2020-04-25 MED ORDER — SODIUM CHLORIDE 0.9% FLUSH
10.0000 mL | Freq: Two times a day (BID) | INTRAVENOUS | Status: DC
  Administered 2020-04-25 – 2020-05-01 (×13): 10 mL

## 2020-04-25 MED ORDER — VITAL 1.5 CAL PO LIQD
1000.0000 mL | ORAL | Status: DC
  Administered 2020-04-25 – 2020-04-26 (×2): 1000 mL

## 2020-04-25 MED ORDER — DEXTROSE 5 % IV SOLN: INTRAVENOUS | Status: DC

## 2020-04-25 MED ORDER — HYDROCORTISONE NA SUCCINATE PF 100 MG IJ SOLR
50.0000 mg | Freq: Four times a day (QID) | INTRAMUSCULAR | Status: DC
  Administered 2020-04-25 – 2020-05-01 (×25): 50 mg via INTRAVENOUS
  Filled 2020-04-25 (×25): qty 2

## 2020-04-25 MED ORDER — SODIUM CHLORIDE 0.9% FLUSH: 10.0000 mL | INTRAVENOUS | Status: DC | PRN

## 2020-04-25 NOTE — Progress Notes (Signed)
Patient placed in supine position by RT x 2, and RN x 3, and NT x 1 without complications.  ETT re-secured with a commercial tube holder at 23cm on the right.

## 2020-04-25 NOTE — Progress Notes (Addendum)
NAME:  Alexander Parks, MRN:  [id number], DOB:  11-04-1950, LOS: 16 ADMISSION DATE:  04/14/2020, CONSULTATION DATE: 04/23/2020 REFERRING MD: Elgergawy-TRH, CHIEF COMPLAINT: Hypoxia  HPI/course in hospital  69 year old man with worsening hypoxia related to COVID-19 pneumonia.  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  Admitted to [facility] 8/23.  Transfer to [facility] 8/31 required BiPAP for several days then stabilized.  Transferred back to hospitalist service 9/3. Returns for worsening hypoxia started on BiPAP overnight.   Past Medical History   Past Medical History:  Diagnosis Date  . Allergy   . Cirrhosis ([place])   . Coronary artery disease   . Diabetes mellitus without complication ([place])   . History of kidney stones         Significant diagnostic tests:  Echocardiogram 9/10: LVEF 60 to 65%, no regional wall motion abnormalities.  Normal RV.  CXR 9/11-bilateral lower lobe infiltrates   Micro:  9/10 respiratory-moderate GNR> 9/10 blood> 8/23 Covid +   Antimicrobials:   Cefepime 9/10>   Interim history/subjective:  Proned overnight.  Objective   Blood pressure (!) 125/47, pulse 86, temperature 97.9 F (36.6 C), temperature source Oral, resp. rate (!) 31, height 6\' 2"  (1.88 m), weight 91.3 kg, SpO2 (!) 89 %.    Vent Mode: PRVC FiO2 (%):  [70 %-100 %] 70 % Set Rate:  [35 bmp] 35 bmp Vt Set:  [490 mL] 490 mL PEEP:  [15 cmH20] 15 cmH20 Plateau Pressure:  [33 cmH20-37 cmH20] 34 cmH20   Intake/Output Summary (Last 24 hours) at 04/25/2020 1252 Last data filed at 04/25/2020 1005 Gross per 24 hour  Intake 2545.47 ml  Output 1050 ml  Net 1495.47 ml   Filed Weights   04/10/20 0500 04/24/20 0500 04/25/20 0500  Weight: 95.9 kg 89.7 kg 91.3 kg     Physical Exam  General: Critically ill-appearing man lying in bed intubated, heavily sedated Neuro: RASS -5,  not responsive to stimulation, no tracheal gag with suctioning HEENT: [place]/AT, eyes anicteric Pulm: Rales bilaterally, normal tracheal secretions CV: Regular rate and rhythm, no murmurs GI: Soft, nontender GU: Cloudy urine, Foley catheter in place Extremity: No significant edema, no clubbing or cyanosis Skin: Pallor, no mottling  Ancillary tests (personally reviewed)  CBC: Recent Labs  Lab 04/19/20 0238 04/19/20 0238 04/20/20 0251 04/20/20 0251 04/21/20 0345 04/21/20 0345 04/23/20 0248 04/23/20 1805 04/24/20 0006 04/24/20 0913 04/24/20 1355 04/24/20 1815 04/25/20 0500  WBC 17.3*   < > 16.6*  --  19.0*  --  21.4*  --   --  24.7*  --   --  25.1*  NEUTROABS 15.7*  --  14.7*  --  16.6*  --   --   --   --   --   --   --   --   HGB 12.2*   < > 12.6*   < > 12.2*   < > 11.7*   < > 11.9* 11.9* 11.9* 11.6* 11.0*  HCT 37.1*   < > 38.1*   < > 37.0*   < > 35.8*   < > 35.0* 40.0 35.0* 34.0* 36.5*  MCV 83.6   < > 84.1  --  84.3  --  84.6  --   --  94.3  --   --  92.2  PLT 143*   < > 157  --  185  --  147*  --   --  165  --   --  173   < > = values in this interval not displayed.    Basic Metabolic Panel: Recent Labs  Lab 04/21/20 0345 04/23/20 0248 04/23/20 2103 04/23/20 2354 04/24/20 0500 04/24/20 0500 04/24/20 1311 04/24/20 1311 04/24/20 1355 04/24/20 1646 04/24/20 1815 04/25/20 0500 04/25/20 0720  NA 132*   < >  --    < > 142   < > 146*   < > 144 146* 146* 148* 150*  K 4.1   < >  --    < > 6.0*   < > 5.7*   < > 6.0* 6.0* 5.6* 4.6 4.5  CL 96*   < >  --   --  108  --  106  --   --  108  --  109 110  CO2 24   < >  --   --  19*  --  24  --   --  21*  --  27 28  GLUCOSE 106*   < >  --   --  225*  --  246*  --   --  234*  --  146* 168*  BUN 90*   < >  --   --  138*  --  144*  --   --  146*  --  155* 155*  CREATININE 1.25*   < >  --   --  2.15*  --  2.31*  --   --  2.20*  --  2.13* 2.12*  CALCIUM 8.6*   < >  --   --  7.6*  --  8.0*  --   --  8.1*  --  8.1* 8.0*  MG 2.5*  --   3.3*  --  3.4*  --   --   --   --  3.4*  --  3.6*  --   PHOS  --   --  8.7*  --  11.6*  --   --   --   --  10.3*  --  6.9*  --    < > = values in this interval not displayed.   GFR: Estimated Creatinine Clearance: 38.2 mL/min (A) (by C-G formula based on SCr of 2.12 mg/dL (H)). Recent Labs  Lab 04/19/20 0238 04/19/20 0238 04/20/20 0251 04/20/20 0251 04/21/20 0345 04/22/20 0543 04/23/20 0248 04/24/20 0913 04/25/20 0500  PROCALCITON 0.11  --  <0.10  --  <0.10 0.10  --   --   --   WBC 17.3*   < > 16.6*   < > 19.0*  --  21.4* 24.7* 25.1*   < > = values in this interval not displayed.    Liver Function Tests: Recent Labs  Lab 04/20/20 0251 04/21/20 0345 04/23/20 0248 04/24/20 0500 04/25/20 0500  AST 59* 76* 85* 104* 95*  ALT 82* 94* 106* 114* 137*  ALKPHOS 103 102 103 107 151*  BILITOT 1.2 1.8* 1.5* 1.1 1.1  PROT 6.5 6.3* 6.3* 5.9* 6.0*  ALBUMIN 2.0* 2.1* 2.1* 1.9* 1.8*   No results for input(s): LIPASE, AMYLASE in the last 168 hours. No results for input(s): AMMONIA in the last 168 hours.  ABG    Component Value Date/Time   PHART 7.323 (L) 04/24/2020 1815   PCO2ART 55.3 (H) 04/24/2020 1815   PO2ART 52 (L) 04/24/2020 1815   HCO3 29.1 (H) 04/24/2020 1815   TCO2 31 04/24/2020 1815   ACIDBASEDEF 3.0 (H) 04/24/2020 0006   O2SAT 85.0 04/24/2020  1815     Coagulation Profile: No results for input(s): INR, PROTIME in the last 168 hours.  Cardiac Enzymes: No results for input(s): CKTOTAL, CKMB, CKMBINDEX, TROPONINI in the last 168 hours.  HbA1C: Hemoglobin A1C  Date/Time Value Ref Range Status  04/29/2014 03:24 PM 8.8 (H) 4.2 - 6.3 % Final    Comment:    The American Diabetes Association recommends that a primary goal of therapy should be <7% and that physicians should reevaluate the treatment regimen in patients with HbA1c values consistently >8%.    Hgb A1c MFr Bld  Date/Time Value Ref Range Status  03/23/2020 11:59 AM 7.9 (H) 4.8 - 5.6 % Final    Comment:      (NOTE)         Prediabetes: 5.7 - 6.4         Diabetes: >6.4         Glycemic control for adults with diabetes: <7.0   02/10/2020 09:32 AM 7.3 (H) 4.8 - 5.6 % Final    Comment:    (NOTE) Pre diabetes:          5.7%-6.4%  Diabetes:              >6.4%  Glycemic control for   <7.0% adults with diabetes     CBG: Recent Labs  Lab 04/24/20 1919 04/24/20 2341 04/25/20 0338 04/25/20 0806 04/25/20 1159  GLUCAP 206* 139* 140* 151* 140*     Assessment & Plan:   Acute hypoxic respiratory failure due to COVID-19 PNA GNR bacterial pneumonia -likely fibrotic component given late stage from initial presentation  As such prognosis for recovery is less favorable and likely to be slow.  He would likely require prolonged mechanical ventilation and tracheostomy and he was advised of this 9/9 -Continue low tidal line ventilation, 4 to 8 cc/kg ideal body weight goal plateau less than 30 driving pressure less than 15.  Not meeting goals, suspect this is due to a more fibrotic component of his disease given the late presentation. -SAT and SBT when appropriate. -VAP, PAD protocols -s/p remdesivir, baricitinib -steroid taper to be completed -Sedation as required to tolerate mechanical ventilation  Septic shock-GNR pneumonia -Continue cefepime -Continue to follow respiratory culture and sensitivities -Continue vasopressors as required to maintain MAP greater than 65 -Starting stress dose steroids today given ongoing requirement for 3 pressors.  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 or hepatic encephalopathy AST, ALT  elevated, normal bilirubin -Holding statin currently -Continue  to monitor hepatic function periodically  Coronary artery disease status post bypass HTN HLD -holding antihypertensives in setting of shock -ASA -dc statin with worsening AKI, hyperkalemia   Acute anemia, likely due to critical illness -Transfuse for hemoglobin less than 7 or hemodynamically significant bleeding -Continue to monitor  Daily Goals Checklist  Pain/Anxiety/Delirium protocol (if indicated): fent, versed VAP protocol (if indicated): VAP Blood pressure target:MAP > 65 SBP > 90 DVT prophylaxis: High-dose Lovenox Nutritional status and feeding goals: EN  GI prophylaxis: Pantoprazole. Fluid status goals: euvolemic to net negative  Urinary catheter: foley Central lines: Peripheral IVs only Glucose control: SSI Mobility/therapy needs: BR  Antibiotic de-escalation: No antibiotics Home medication reconciliation: holding home antihypertensives in setting of shock  Daily labs: CBC, BMP Code Status: Full code Family Communication:  wife  Disposition: ICU    This patient is critically ill with multiple organ system failure which requires frequent high complexity decision making, assessment, support, evaluation, and titration of therapies. This was completed through the application of advanced monitoring technologies and extensive interpretation of multiple databases. During this encounter critical care time was devoted to patient care services described in this note for 33 minutes.   Julian Hy, DO 04/25/20 1:30 PM [facility]

## 2020-04-25 NOTE — Consult Note (Addendum)
Consultation Note Date: 04/25/2020   Patient Name: Alexander Parks  DOB: Oct 02, 1950  MRN: [id number]  Age / Sex: 69 y.o., male  PCP: Quintin Alto Silvestre Moment, MD Referring Physician: Julian Hy, DO  Reason for Consultation: Establishing goals of care  HPI/Patient Profile: 69 y.o. male "Alexander Parks"  with past medical history of cirrhosis (stg 4 of 4), Barretts esophagus, DM, CAD s/p CABG x 5 (01/2018), Lap chole w/ liver biopsy and umbilical hernia repair (01/2020) who was admitted on 03/24/2020 with septic shock from COVID pneumonia.  He was originally admitted at [facility] but was transferred to [facility] on 8/31.  He improved and has since worsened again.  He is currently intubated and responsive only to pain. Current xrays show bilateral lung infiltrates and sputum cultures show GNR.  Labs including BUN/creatinine and WBC are trending in the wrong direction.    Clinical Assessment and Goals of Care:  I have reviewed medical records including EPIC notes, labs and imaging, received report from the ICU RN and spoke on the phone with his wife Alexander Parks to discuss diagnosis prognosis, [place], EOL wishes, disposition and options.  I introduced Palliative Medicine as specialized medical care for people living with serious illness. It focuses on providing relief from the symptoms and stress of a serious illness.   We discussed a brief life review of the patient.  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  He goes back and forth to [place] each week.  He loves to talk with people and use the CB radio.  He has lots of friends.  Loves fishing, guns, Public librarian and especially [place].  Alexander Parks says he can tell you anything about Cristino Martes.  Alexander Parks was getting ready to retire.  Alexander Parks states he will not work anymore after discharge from the hospital. Finally Alexander Parks tells me her husband is very strong willed.  He stated he was going to be ok before  they re intubated him - and she is praying that he will be ok.  Alexander Parks has 3 children and Alexander Parks has 2 daughters from previous marriages.   Alexander Parks is [place] and states that Alexander Parks was [place] as well.  We discussed his current illness and what it means in the larger context of his on-going co-morbidities.  Natural disease trajectory and expectations at EOL were discussed.  Alexander Parks explains that despite his CABG surgery and his most recent surgery they never had conversations about EOL.  She relied on his strong will and his word when he said he would be alright.  I attempted to elicit values and goals of care important to the patient.  Alexander Parks feels strongly that her husband would want to continue to fight to pull thru this illness.  We discussed the potential need for trach/PEG given the likelihood of a very prolonged illness.  Alexander Parks felt her husband would not want to live a life connected to machines and unable to care for himself.  Questions and concerns were addressed.  I thanked Alexander Parks for speaking with me  and committed to follow up with her tomorrow.     Primary Decision Maker:  NEXT OF KIN Wife Alexander Parks.    SUMMARY OF RECOMMENDATIONS    Full scope full code. Will request a family meeting with wife and family members she deems appropriate for Sun/Mon.  Code Status/Advance Care Planning:  Full code   Symptom Management:   Per CCM  Additional Recommendations (Limitations, Scope, Preferences):  Full Scope Treatment  Palliative Prophylaxis:   Frequent Pain Assessment  Psycho-social/Spiritual:   Desire for further Chaplaincy support: welcomed.  Prognosis: very concerning given prolonged severe COVID illness in the setting of severe CAD, DM, cirrhosis.    Discharge Planning: To Be Determined      Primary Diagnoses: Present on Admission:  Acute respiratory failure with hypoxia ([place])  Pneumonia due to COVID-19 virus  Mixed hyperlipidemia  Ischemic cardiomyopathy   Hepatic cirrhosis (HCC)  Coronary artery disease involving native coronary artery of native heart without angina pectoris  Chronic systolic heart failure ([place])  AKI (acute kidney injury) ([place])  COVID-19   I have reviewed the medical record, interviewed the patient and family, and examined the patient. The following aspects are pertinent.  Past Medical History:  Diagnosis Date   Allergy    Cirrhosis ([place])    Coronary artery disease    Diabetes mellitus without complication ([place])    History of kidney stones    Social History   Socioeconomic History   Marital status: Married    Spouse name: Alexander Parks   Number of children: 5   Years of education: 8 years   Highest education level: 8th grade  Occupational History   Occupation: Trucker    Comment: [employer]  Tobacco Use   Smoking status: Former Smoker    Packs/day: 2.00    Years: 15.00    Pack years: 30.00    Types: Cigarettes    Quit date: 08/16/1996    Years since quitting: 23.7   Smokeless tobacco: Never Used  Substance and Sexual Activity   Alcohol use: No   Drug use: No   Sexual activity: Yes    Birth control/protection: None  Other Topics Concern   Not on file  Social History Narrative   Not on file   Social Determinants of Health   Financial Resource Strain:    Difficulty of Paying Living Expenses: Not on file  Food Insecurity:    Worried About Charity fundraiser in the Last Year: Not on file   [employer] of Food in the Last Year: Not on file  Transportation Needs:    Lack of Transportation (Medical): Not on file   Lack of Transportation (Non-Medical): Not on file  Physical Activity:    Days of Exercise per Week: Not on file   Minutes of Exercise per Session: Not on file  Stress:    Feeling of Stress : Not on file  Social Connections:    Frequency of Communication with Friends and Family: Not on file   Frequency of Social Gatherings with Friends and Family:  Not on file   Attends Religious Services: Not on file   Active Member of Clubs or Organizations: Not on file   Attends Archivist Meetings: Not on file   Marital Status: Not on file   Family History  Problem Relation Age of Onset   Cancer Mother    Cancer Sister    Liver disease Brother     Allergies  Allergen Reactions  Benadryl [Diphenhydramine Hcl] Swelling   Iodinated Diagnostic Agents Other (See Comments)    Unknown   Morphine And Related Other (See Comments)    Unknown      Vital Signs: BP (!) 129/52    Pulse 84    Temp 97.9 F (36.6 C) (Oral)    Resp (!) 35    Ht 6\' 2"  (1.88 m)    Wt 91.3 kg    SpO2 92%    BMI 25.84 kg/m  Pain Scale: CPOT POSS *See Group Information*: 1-Acceptable,Awake and alert Pain Score: 0-No pain   SpO2: SpO2: 92 % O2 Device:SpO2: 92 % O2 Flow Rate: .O2 Flow Rate (L/min): 45 L/min (45L HHFNC/15L NRB)    Palliative Assessment/Data: 10%     Time In: 3:00 Time Out: 4:00 Time Total: 60 min. Visit consisted of counseling and education dealing with the complex and emotionally intense issues surrounding the need for palliative care and symptom management in the setting of serious and potentially life-threatening illness. Greater than 50%  of this time was spent counseling and coordinating care related to the above assessment and plan.  Signed by: Florentina Jenny, PA-C Palliative Medicine  Please contact Palliative Medicine Team phone at [phone] for questions and concerns.  For individual provider: See Shea Evans

## 2020-04-25 NOTE — Progress Notes (Signed)
Head turned at this time, no complications noted.  

## 2020-04-25 NOTE — Progress Notes (Signed)
Proned pt @2220 . RTx2 RNx4. Removed tube holder and secured ETT with cloth tape. No breakdown on face or mouth noted. Suctioned mouth and airway . Pt tolerated well.

## 2020-04-25 NOTE — Progress Notes (Signed)
[facility] Progress Note    Assessment/ Plan:    1.  AKI: secondary to distributive shock (septic).  Cr 1.01-->2.3 with some hyperkalemia.  Azotemic to 144.  His UOP is improved as is his K after aggressive treatment.  Azotemia persists; no hard indication to start RRT so will hold off.  2.  Acute hypoxic RF 2/2 COVID pneumonia: got remdesivir, baricitinib, steroid taper.  On Cefepime.  Vented per PCCM, proning protocol in place  3.  Hyperkalemia: up to 5.7/ 6.0.  S/p Ca gluconate, bicarb, insulin/ dextrose, one dose of lokelma. Continue Lokelma 10 TID.    4.  Shock: likely septic/ distributive, TTE without evidence of PE.  Cultures/ pressors/ antibiotics per PCCM.   Lower extremity dopplers no DVT  5.  Dispo: ICU  Subjective:    UOP increased and K down.  Cr down as well.     Objective:   BP (!) 122/48   Pulse 87   Temp 97.9 F (36.6 C) (Oral)   Resp (!) 35   Ht $R'6\' 2"'Lk$  (1.88 m)   Wt 91.3 kg   SpO2 96%   BMI 25.84 kg/m   Intake/Output Summary (Last 24 hours) at 04/25/2020 1024 Last data filed at 04/25/2020 1005 Gross per 24 hour  Intake 3215.73 ml  Output 1150 ml  Net 2065.73 ml   Weight change: 1.6 kg  Physical Exam see exam from 9/10, not examined today d/t COVID 19 status to limit exposure to other providers and conserve PPE: GEN intubated, sedated HEENT eyes closed NECK no JVD PULM coarse breath sounds bilaterally diffusely CV RRR no m/r/g ABD soft, nontender EXT 1+ LE edema NEURO intubated, sedated SKIN no rashes  Imaging: DG CHEST PORT 1 VIEW  Result Date: 04/25/2020 CLINICAL DATA:  COVID positive.  Respiratory failure EXAM: PORTABLE CHEST 1 VIEW COMPARISON:  04/25/2019 FINDINGS: Endotracheal tube and central venous line unchanged. Removal of NG tube and replacement with feeding tube. Feeding tube extends the stomach. Bibasilar mild airspace disease. No pneumothorax. IMPRESSION: 1. Stable support apparatus. 2. Mild bibasilar airspace  disease. 3. Feeding tube extends into the stomach. Electronically Signed   By: Suzy Bouchard M.D.   On: 04/25/2020 07:25   DG CHEST PORT 1 VIEW  Result Date: 04/24/2020 CLINICAL DATA:  Central line placement. EXAM: PORTABLE CHEST 1 VIEW COMPARISON:  04/24/2020. FINDINGS: Endotracheal tube, NG tube, right IJ line stable position. Prior CABG. Heart size stable. Diffuse bilateral pulmonary infiltrates/edema again noted. Slight worsening noted on today's exam. No pleural effusion or pneumothorax. Costophrenic angles incompletely imaged. IMPRESSION: 1. Interim placement right IJ line. Tip over SVC. Endotracheal tube and NG tube stable position. 2.  Prior CABG.  Heart size stable. 3. Diffuse bilateral pulmonary infiltrates/edema again noted. Slight worsening noted on today's exam. Electronically Signed   By: Marcello Moores  Register   On: 04/24/2020 11:29   DG CHEST PORT 1 VIEW  Result Date: 04/24/2020 CLINICAL DATA:  Intubation.  COVID. EXAM: PORTABLE CHEST 1 VIEW COMPARISON:  04/23/2020. FINDINGS: Endotracheal tube and NG tube in stable position. Prior CABG. Heart size normal. Diffuse bilateral pulmonary infiltrates again noted without interim change. No pleural effusion or pneumothorax. Degenerative change thoracic spine. IMPRESSION: 1.  Endotracheal tube and NG tube in stable position. 2.  Prior CABG.  Heart size normal. 3. Diffuse bilateral pulmonary infiltrates again noted without interim change. Electronically Signed   By: Hancock   On: 04/24/2020 05:22   DG CHEST PORT 1 VIEW  Result Date:  04/23/2020 CLINICAL DATA:  69 year old male intubated.  COVID-19. EXAM: PORTABLE CHEST 1 VIEW COMPARISON:  Portable chest 1236 hours today and earlier. FINDINGS: Portable AP semi upright view at 1735 hours. Intubated. Endotracheal tube tip in good position between the level the clavicles and carina. Enteric tube placed, side hole the level of the gastric body. Mildly improved lung volumes from earlier today.  Mediastinal contours remain normal. Prior CABG. Coarse bilateral pulmonary interstitial opacity most pronounced at the lung bases appear stable since 04/20/2020. No pneumothorax or pleural effusion. Negative visible bowel gas pattern. Stable cholecystectomy clips. IMPRESSION: 1. Endotracheal tube and enteric tube in good position. 2. Stable bilateral COVID-19 pneumonia since 04/20/2020. Electronically Signed   By: Genevie Ann M.D.   On: 04/23/2020 17:51   DG CHEST PORT 1 VIEW  Result Date: 04/23/2020 CLINICAL DATA:  Respiratory failure EXAM: PORTABLE CHEST 1 VIEW COMPARISON:  Radiograph 04/20/2020 FINDINGS: Sternotomy wires overlie normal cardiac silhouette. Mild patchy bilateral airspace disease. Some increased density in the LEFT lower lobe. Improvement in RIGHT lower lobe opacities. No pneumothorax. IMPRESSION: 1. Overall minimal interval change in bilateral airspace disease. 2. Some mild improvement in the RIGHT lower lobe and increased density LEFT lung. Electronically Signed   By: Suzy Bouchard M.D.   On: 04/23/2020 12:53   VAS [place] LOWER EXTREMITY VENOUS (DVT)  Result Date: 04/24/2020  Lower Venous DVTStudy Indications: Elevated D-dimer.  Comparison Study: No prior studies. Performing Technologist: Darlin Coco  Examination Guidelines: A complete evaluation includes B-mode imaging, spectral Doppler, color Doppler, and power Doppler as needed of all accessible portions of each vessel. Bilateral testing is considered an integral part of a complete examination. Limited examinations for reoccurring indications may be performed as noted. The reflux portion of the exam is performed with the patient in reverse Trendelenburg.  +---------+---------------+---------+-----------+----------+--------------+ RIGHT    CompressibilityPhasicitySpontaneityPropertiesThrombus Aging +---------+---------------+---------+-----------+----------+--------------+ CFV      Full           Yes      Yes                                  +---------+---------------+---------+-----------+----------+--------------+ SFJ      Full                                                        +---------+---------------+---------+-----------+----------+--------------+ FV Prox  Full                                                        +---------+---------------+---------+-----------+----------+--------------+ FV Mid   Full                                                        +---------+---------------+---------+-----------+----------+--------------+ FV DistalFull                                                        +---------+---------------+---------+-----------+----------+--------------+  PFV      Full                                                        +---------+---------------+---------+-----------+----------+--------------+ POP      Full           Yes      Yes                                 +---------+---------------+---------+-----------+----------+--------------+ PTV      Full                                                        +---------+---------------+---------+-----------+----------+--------------+ PERO     Full                                                        +---------+---------------+---------+-----------+----------+--------------+   +---------+---------------+---------+-----------+----------+--------------+ LEFT     CompressibilityPhasicitySpontaneityPropertiesThrombus Aging +---------+---------------+---------+-----------+----------+--------------+ CFV      Full           Yes      Yes                                 +---------+---------------+---------+-----------+----------+--------------+ SFJ      Full                                                        +---------+---------------+---------+-----------+----------+--------------+ FV Prox  Full                                                         +---------+---------------+---------+-----------+----------+--------------+ FV Mid   Full                                                        +---------+---------------+---------+-----------+----------+--------------+ FV DistalFull                                                        +---------+---------------+---------+-----------+----------+--------------+ PFV      Full                                                        +---------+---------------+---------+-----------+----------+--------------+  POP      Full           Yes      Yes                                 +---------+---------------+---------+-----------+----------+--------------+ PTV      Full                                                        +---------+---------------+---------+-----------+----------+--------------+ PERO     Full                                                        +---------+---------------+---------+-----------+----------+--------------+     Summary: RIGHT: - There is no evidence of deep vein thrombosis in the lower extremity.  - No cystic structure found in the popliteal fossa.  LEFT: - There is no evidence of deep vein thrombosis in the lower extremity.  - No cystic structure found in the popliteal fossa.  *See table(s) above for measurements and observations. Electronically signed by Monica Martinez MD on 04/24/2020 at 4:43:11 PM.    Final    ECHOCARDIOGRAM LIMITED  Result Date: 04/24/2020    ECHOCARDIOGRAM LIMITED REPORT   Patient Name:   DMITRY MACOMBER Date of Exam: 04/24/2020 Medical Rec #:  [id number]        Height:       74.0 in Accession #:    [id number]       Weight:       197.8 lb Date of Birth:  [birth year]        BSA:          2.163 m Patient Age:    26 years         BP:           133/59 mmHg Patient Gender: M                HR:           77 bpm. Exam Location:  Inpatient Procedure: Limited Color Doppler and Cardiac Doppler Indications:    shock  History:         Patient has prior history of Echocardiogram examinations, most                 recent 06/27/2018. Prior CABG, Covid. cirrhosis; Risk                 Factors:Dyslipidemia and Diabetes.  Sonographer:    Johny Chess Referring Phys: [id number] [place]  1. Left ventricular ejection fraction, by estimation, is 60 to 65%. The left ventricle has normal function. The left ventricle has no regional wall motion abnormalities.  2. Right ventricular systolic function is normal. The right ventricular size is normal. There is normal pulmonary artery systolic pressure.  3. The mitral valve is normal in structure. No evidence of mitral valve regurgitation. No evidence of mitral stenosis.  4. The aortic valve is normal in structure. Aortic valve regurgitation is not visualized. No aortic stenosis is present. FINDINGS  Left Ventricle: Left ventricular ejection fraction, by estimation,  is 60 to 65%. The left ventricle has normal function. The left ventricle has no regional wall motion abnormalities. The left ventricular internal cavity size was normal in size. Right Ventricle: The right ventricular size is normal. No increase in right ventricular wall thickness. Right ventricular systolic function is normal. There is normal pulmonary artery systolic pressure. The tricuspid regurgitant velocity is 2.59 m/s, and  with an assumed right atrial pressure of 3 mmHg, the estimated right ventricular systolic pressure is 81.1 mmHg. Mitral Valve: The mitral valve is normal in structure. No evidence of mitral valve stenosis. Tricuspid Valve: The tricuspid valve is normal in structure. Tricuspid valve regurgitation is trivial. Aortic Valve: The aortic valve is normal in structure. Aortic valve regurgitation is not visualized. No aortic stenosis is present. Pulmonic Valve: The pulmonic valve was normal in structure. Pulmonic valve regurgitation is not visualized. LEFT VENTRICLE PLAX 2D LVIDd:         4.90 cm  Diastology LVIDs:          2.60 cm  LV e' medial:    7.07 cm/s LV PW:         0.90 cm  LV E/e' medial:  8.7 LV IVS:        1.00 cm  LV e' lateral:   11.10 cm/s LVOT diam:     2.10 cm  LV E/e' lateral: 5.6 LV SV:         56 LV SV Index:   26 LVOT Area:     3.46 cm  IVC IVC diam: 2.10 cm LEFT ATRIUM         Index LA diam:    3.10 cm 1.43 cm/m  AORTIC VALVE LVOT Vmax:   73.10 cm/s LVOT Vmean:  50.000 cm/s LVOT VTI:    0.161 m  AORTA Ao Root diam: 3.90 cm Ao Asc diam:  3.60 cm MITRAL VALVE               TRICUSPID VALVE MV Area (PHT): 3.27 cm    TR Peak grad:   26.8 mmHg MV Decel Time: 232 msec    TR Vmax:        259.00 cm/s MV E velocity: 61.70 cm/s MV A velocity: 70.70 cm/s  SHUNTS MV E/A ratio:  0.87        Systemic VTI:  0.16 m                            Systemic Diam: 2.10 cm Mertie Moores MD Electronically signed by Mertie Moores MD Signature Date/Time: 04/24/2020/3:42:03 PM    Final    [place] EKG SITE RITE  Result Date: 04/24/2020 If Site Rite image not attached, placement could not be confirmed due to current cardiac rhythm.   Labs: BMET Recent Labs  Lab 04/21/20 0345 04/21/20 0345 04/23/20 0248 04/23/20 1805 04/23/20 2103 04/23/20 2354 04/24/20 0500 04/24/20 1311 04/24/20 1355 04/24/20 1646 04/24/20 1815 04/25/20 0500 04/25/20 0720  NA 132*   < > 136   < >  --    < > 142 146* 144 146* 146* 148* 150*  K 4.1   < > 4.2   < >  --    < > 6.0* 5.7* 6.0* 6.0* 5.6* 4.6 4.5  CL 96*  --  101  --   --   --  108 106  --  108  --  109 110  CO2 24  --  22  --   --   --  19* 24  --  21*  --  27 28  GLUCOSE 106*  --  108*  --   --   --  225* 246*  --  234*  --  146* 168*  BUN 90*  --  119*  --   --   --  138* 144*  --  146*  --  155* 155*  CREATININE 1.25*  --  1.31*  --   --   --  2.15* 2.31*  --  2.20*  --  2.13* 2.12*  CALCIUM 8.6*  --  8.3*  --   --   --  7.6* 8.0*  --  8.1*  --  8.1* 8.0*  PHOS  --   --   --   --  8.7*  --  11.6*  --   --  10.3*  --  6.9*  --    < > = values in this interval not displayed.    CBC Recent Labs  Lab 04/19/20 0238 04/19/20 0238 04/20/20 0251 04/20/20 0251 04/21/20 0345 04/21/20 0345 04/23/20 0248 04/23/20 1805 04/24/20 0913 04/24/20 1355 04/24/20 1815 04/25/20 0500  WBC 17.3*   < > 16.6*   < > 19.0*  --  21.4*  --  24.7*  --   --  25.1*  NEUTROABS 15.7*  --  14.7*  --  16.6*  --   --   --   --   --   --   --   HGB 12.2*   < > 12.6*   < > 12.2*   < > 11.7*   < > 11.9* 11.9* 11.6* 11.0*  HCT 37.1*   < > 38.1*   < > 37.0*   < > 35.8*   < > 40.0 35.0* 34.0* 36.5*  MCV 83.6   < > 84.1   < > 84.3  --  84.6  --  94.3  --   --  92.2  PLT 143*   < > 157   < > 185  --  147*  --  165  --   --  173   < > = values in this interval not displayed.    Medications:    . artificial tears  1 application Both Eyes E5I  . vitamin C  500 mg Per Tube Daily  . aspirin  81 mg Per Tube Daily  . chlorhexidine gluconate (MEDLINE KIT)  15 mL Mouth Rinse BID  . Chlorhexidine Gluconate Cloth  6 each Topical Daily  . docusate  100 mg Per Tube BID  . feeding supplement (PROSource TF)  90 mL Per Tube QID  . heparin injection (subcutaneous)  5,000 Units Subcutaneous Q8H  . insulin aspart  0-20 Units Subcutaneous Q4H  . mouth rinse  15 mL Mouth Rinse 10 times per day  . methylPREDNISolone (SOLU-MEDROL) injection  20 mg Intravenous Q12H  . pantoprazole (PROTONIX) IV  40 mg Intravenous Daily  . polyethylene glycol  17 g Per Tube Daily  . sevelamer carbonate  0.8 g Per Tube TID WC  . sodium chloride flush  10-40 mL Intracatheter Q12H  . sodium zirconium cyclosilicate  10 g Per Tube TID  . zinc sulfate  220 mg Per Tube Daily      Madelon Lips MD 04/25/2020, 10:24 AM

## 2020-04-25 NOTE — Progress Notes (Signed)
Nutrition Follow-up  DOCUMENTATION CODES:   Not applicable (High Nutritional Risk)  INTERVENTION:   Tube Feeding via Cortrak: Vital 1.5 at 55 ml/hr Pro-Source TF 45 mL QID Begin at 20 ml/hr, titrate by 10 mL q 8 hours until goal rate of 55 ml/hr  Goal rate provides 2140 kcals, 133 g of protein and 1003 mL of free water.   If hyperkalemia returns, consider changing to Nepro TF at 45 ml/hr (goal rate)   NUTRITION DIAGNOSIS:   Increased nutrient needs related to acute illness as evidenced by estimated needs.  Being addressed via TF   GOAL:   Patient will meet greater than or equal to 90% of their needs  Progressing  MONITOR:   Vent status, Labs, Weight trends, TF tolerance, Skin  REASON FOR ASSESSMENT:   Consult, Ventilator Enteral/tube feeding initiation and management  ASSESSMENT:   69 yo male admitted on 8/23 with acute respiratory failure related to COVID 19 pna, worsening with likely fibrotic component, requiring transfer to ICU on 9/9 with  intubation, shock requiring pressors, AKI with electrolyte abnormalities. PMH includes CAD/CABG x 5, cirrhosis, CHF, DM   Pt remains on vent support, sedated on fentanyl and versed, requiring pressors  Drips:  Levophed 40 mcg/min Phenylephrine 30 mcg/min Vasopressin 0.03 units/min  Azotemia persists, UOP improved, not on RRT.  Hyperkalemia improved.   Vital formula infusing at 20 ml/hr (although Vital 1.5 TF order discontinued yesterday at request of possible change to Nepro by MD). Potassium improved despite patient remaining on Vital formula. Concerned that given pt's critical illness, requiring 3 pressors, that he may not tolerate Nepro and would best benefit from a semi-elemental formula.   Labs: sodium 150 (H), BUN 155 (H), Creatinine 2.12, phosphorus 6.9 (H), potassium 4.5 (wdl), CBGs 139-206 Meds: Vit C 500 mg, D5 at 75 ml/hr, colace, miralax, ss novolog, solumedrol, renvela, lokelma, zinc sulfate   Diet Order:    Diet Order            Diet NPO time specified  Diet effective now                 EDUCATION NEEDS:   Not appropriate for education at this time  Skin:  Skin Assessment: Skin Integrity Issues: Skin Integrity Issues:: Stage II Stage II: ear  Last BM:  9/08  Height:   Ht Readings from Last 1 Encounters:  04/07/20 6\' 2"  (1.88 m)    Weight:   Wt Readings from Last 1 Encounters:  04/25/20 91.3 kg     BMI:  Body mass index is 25.84 kg/m.  Estimated Nutritional Needs:   Kcal:  2250-2700 kcals  Protein:  115-150 g  Fluid:  >/= 2 L    Kerman Passey MS, RDN, LDN, CNSC Registered Dietitian III Clinical Nutrition RD Pager and On-Call Pager Number Located in [place]

## 2020-04-26 DIAGNOSIS — J155 Pneumonia due to Escherichia coli: Secondary | ICD-10-CM

## 2020-04-26 DIAGNOSIS — J80 Acute respiratory distress syndrome: Secondary | ICD-10-CM

## 2020-04-26 LAB — CBC
HCT: 33.4 % — ABNORMAL LOW (ref 39.0–52.0)
Hemoglobin: 9.9 g/dL — ABNORMAL LOW (ref 13.0–17.0)
MCH: 28.4 pg (ref 26.0–34.0)
MCHC: 29.6 g/dL — ABNORMAL LOW (ref 30.0–36.0)
MCV: 95.7 fL (ref 80.0–100.0)
Platelets: 101 10*3/uL — ABNORMAL LOW (ref 150–400)
RBC: 3.49 MIL/uL — ABNORMAL LOW (ref 4.22–5.81)
RDW: 18.1 % — ABNORMAL HIGH (ref 11.5–15.5)
WBC: 17.4 10*3/uL — ABNORMAL HIGH (ref 4.0–10.5)
nRBC: 0.2 % (ref 0.0–0.2)

## 2020-04-26 LAB — POCT I-STAT 7, (LYTES, BLD GAS, ICA,H+H)
Acid-Base Excess: 3 mmol/L — ABNORMAL HIGH (ref 0.0–2.0)
Bicarbonate: 32.2 mmol/L — ABNORMAL HIGH (ref 20.0–28.0)
Calcium, Ion: 1.07 mmol/L — ABNORMAL LOW (ref 1.15–1.40)
HCT: 30 % — ABNORMAL LOW (ref 39.0–52.0)
Hemoglobin: 10.2 g/dL — ABNORMAL LOW (ref 13.0–17.0)
O2 Saturation: 85 %
Patient temperature: 100.7
Potassium: 4.9 mmol/L (ref 3.5–5.1)
Sodium: 149 mmol/L — ABNORMAL HIGH (ref 135–145)
TCO2: 34 mmol/L — ABNORMAL HIGH (ref 22–32)
pCO2 arterial: 78.1 mmHg (ref 32.0–48.0)
pH, Arterial: 7.229 — ABNORMAL LOW (ref 7.350–7.450)
pO2, Arterial: 65 mmHg — ABNORMAL LOW (ref 83.0–108.0)

## 2020-04-26 LAB — COMPREHENSIVE METABOLIC PANEL
ALT: 108 U/L — ABNORMAL HIGH (ref 0–44)
AST: 60 U/L — ABNORMAL HIGH (ref 15–41)
Albumin: 1.5 g/dL — ABNORMAL LOW (ref 3.5–5.0)
Alkaline Phosphatase: 151 U/L — ABNORMAL HIGH (ref 38–126)
Anion gap: 11 (ref 5–15)
BUN: 158 mg/dL — ABNORMAL HIGH (ref 8–23)
CO2: 27 mmol/L (ref 22–32)
Calcium: 7.5 mg/dL — ABNORMAL LOW (ref 8.9–10.3)
Chloride: 109 mmol/L (ref 98–111)
Creatinine, Ser: 1.99 mg/dL — ABNORMAL HIGH (ref 0.61–1.24)
GFR calc Af Amer: 39 mL/min — ABNORMAL LOW (ref >60)
GFR calc non Af Amer: 33 mL/min — ABNORMAL LOW (ref >60)
Glucose, Bld: 333 mg/dL — ABNORMAL HIGH (ref 70–99)
Potassium: 4.4 mmol/L (ref 3.5–5.1)
Sodium: 147 mmol/L — ABNORMAL HIGH (ref 135–145)
Total Bilirubin: 1 mg/dL (ref 0.3–1.2)
Total Protein: 5.4 g/dL — ABNORMAL LOW (ref 6.5–8.1)

## 2020-04-26 LAB — GLUCOSE, CAPILLARY
Glucose-Capillary: 296 mg/dL — ABNORMAL HIGH (ref 70–99)
Glucose-Capillary: 303 mg/dL — ABNORMAL HIGH (ref 70–99)
Glucose-Capillary: 281 mg/dL — ABNORMAL HIGH (ref 70–99)
Glucose-Capillary: 242 mg/dL — ABNORMAL HIGH (ref 70–99)
Glucose-Capillary: 258 mg/dL — ABNORMAL HIGH (ref 70–99)
Glucose-Capillary: 242 mg/dL — ABNORMAL HIGH (ref 70–99)

## 2020-04-26 LAB — PHOSPHORUS: Phosphorus: 5.8 mg/dL — ABNORMAL HIGH (ref 2.5–4.6)

## 2020-04-26 LAB — CULTURE, RESPIRATORY W GRAM STAIN

## 2020-04-26 LAB — CALCIUM, IONIZED: Calcium, Ionized, Serum: 4.6 mg/dL (ref 4.5–5.6)

## 2020-04-26 MED ORDER — DEXTROSE 50 % IV SOLN: 0.0000 mL | INTRAVENOUS | Status: DC | PRN

## 2020-04-26 MED ORDER — FREE WATER
200.0000 mL | Status: DC
  Administered 2020-04-26: 200 mL

## 2020-04-26 MED ORDER — DEXTROSE 5 % IV SOLN: INTRAVENOUS | Status: DC

## 2020-04-26 MED ORDER — INSULIN ASPART 100 UNIT/ML ~~LOC~~ SOLN
0.0000 [IU] | SUBCUTANEOUS | Status: DC
  Administered 2020-04-26 (×2): 11 [IU] via SUBCUTANEOUS
  Administered 2020-04-26: 7 [IU] via SUBCUTANEOUS
  Administered 2020-04-27: 11 [IU] via SUBCUTANEOUS
  Administered 2020-04-27: 7 [IU] via SUBCUTANEOUS
  Administered 2020-04-27 (×2): 11 [IU] via SUBCUTANEOUS

## 2020-04-26 MED ORDER — INSULIN DETEMIR 100 UNIT/ML ~~LOC~~ SOLN
25.0000 [IU] | Freq: Two times a day (BID) | SUBCUTANEOUS | Status: DC
  Administered 2020-04-26 – 2020-04-27 (×3): 25 [IU] via SUBCUTANEOUS
  Filled 2020-04-26 (×4): qty 0.25

## 2020-04-26 MED ORDER — INSULIN REGULAR(HUMAN) IN NACL 100-0.9 UT/100ML-% IV SOLN: INTRAVENOUS | Status: DC

## 2020-04-26 NOTE — Progress Notes (Signed)
Family meeting over the phone with Palliative Care, his wife [name], and his daughter Judeen Hammans. Updated them on his care plan and his prognosis. All questions were answered. They wish to continue all aggressive care measures for now. They wish to make him DNR. Order changed in epic and discussed with bedside RN.  Julian Hy, DO 04/26/20 2:31 PM [facility]

## 2020-04-26 NOTE — Progress Notes (Signed)
NAME:  Alexander Parks, MRN:  [id number], DOB:  [birth year], LOS: 77 ADMISSION DATE:  04/09/2020, CONSULTATION DATE: 04/23/2020 REFERRING MD: Elgergawy-TRH, CHIEF COMPLAINT: Hypoxia  HPI/course in hospital  69 year old man with worsening hypoxia related to COVID-19 pneumonia.  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  Admitted to [facility] 8/23.  Transfer to [facility] 8/31 required ed back to hospitalist service 9/3. Returns for worsening hypoxia started on BiPAP overnight.   Past Medical History   Past Medical History:  Diagnosis Date  . Allergy   . Cirrhosis ([place])   . Coronary artery disease   . Diabetes mellitus without complication ([place])   . History of kidney stones         Significant diagnostic tests:  Echocardiogram 9/10: LVEF 60 to 65%, no regional wall motion abnormalities.  Normal RV.  CXR 9/11-bilateral lower lobe infiltrates   Micro:  9/10 respiratory-moderate GNR> E. Coli> 9/10 blood>  8/23 Covid +   Antimicrobials:  Cefepime 9/10>   Interim history/subjective:  Re- proned overnight. Family meeting this afternoon.  Objective   Blood pressure (!) 148/59, pulse 100, temperature 98.8 F (37.1 C), temperature source Axillary, resp. rate 19, height 6\' 2"  (1.88 m), weight 91.3 kg, SpO2 91 %.    Vent Mode: PRVC FiO2 (%):  [70 %-100 %] 80 % Set Rate:  [35 bmp] 35 bmp Vt Set:  [490 mL] 490 mL PEEP:  [15 cmH20] 15 cmH20 Plateau Pressure:  [31 cmH20-36 cmH20] 31 cmH20   Intake/Output Summary (Last 24 hours) at 04/26/2020 1018 Last data filed at 04/26/2020 0900 Gross per 24 hour  Intake 4548.37 ml  Output 1090 ml  Net 3458.37 ml   Filed Weights   04/10/20 0500 04/24/20 0500 04/25/20 0500  Weight: 95.9 kg 89.7 kg 91.3 kg   I/O:+3L   Physical Exam  General: Elderly man intubated, sedated, proned Neuro: RASS 0.5, nonresponsive to verbal  stimulation or during exam.  No gag with tracheal suctioning. HEENT: [place]/AT Pulm: Bilateral rales , Minimal tracheal secretions CV: Tachycardic, regular rhythm, sinus tach on telemetry GI: Soft, nontender, nondistended GU: Foley Extremity: No significant edema Skin: Pallor, no cyanosis or mottling  Ancillary tests (personally reviewed)  CBC: Recent Labs  Lab 04/20/20 0251 04/20/20 0251 04/21/20 0345 04/21/20 0345 04/23/20 0248 04/23/20 1805 04/24/20 0913 04/24/20 1355 04/24/20 1815 04/25/20 0500 04/25/20 1429 04/25/20 2313 04/26/20 0427  WBC 16.6*   < > 19.0*  --  21.4*  --  24.7*  --   --  25.1*  --   --  17.4*  NEUTROABS 14.7*  --  16.6*  --   --   --   --   --   --   --   --   --   --   HGB 12.6*   < > 12.2*   < > 11.7*   < > 11.9*   < > 11.6* 11.0* 10.9* 10.2* 9.9*  HCT 38.1*   < > 37.0*   < > 35.8*   < > 40.0   < > 34.0* 36.5* 32.0* 30.0* 33.4*  MCV 84.1   < > 84.3  --  84.6  --  94.3  --   --  92.2  --   --  95.7  PLT 157   < > 185  --  147*  --  165  --   --  173  --   --  101*   < > = values in this interval not displayed.    Basic Metabolic Panel: Recent Labs  Lab 04/21/20 0345 04/23/20 0248 04/23/20 2103 04/23/20 2354 04/24/20 0500 04/24/20 0500 04/24/20 1311 04/24/20 1355 04/24/20 1646 04/24/20 1815 04/25/20 0500 04/25/20 0720 04/25/20 1429 04/25/20 2313 04/26/20 0427  NA 132*   < >  --    < > 142   < > 146*   < > 146*   < > 148* 150* 147* 148* 147*  K 4.1   < >  --    < > 6.0*   < > 5.7*   < > 6.0*   < > 4.6 4.5 4.3 4.4 4.4  CL 96*   < >  --   --  108   < > 106  --  108  --  109 110  --   --  109  CO2 24   < >  --   --  19*   < > 24  --  21*  --  27 28  --   --  27  GLUCOSE 106*   < >  --   --  225*   < > 246*  --  234*  --  146* 168*  --   --  333*  BUN 90*   < >  --   --  138*   < > 144*  --  146*  --  155* 155*  --   --  158*  CREATININE 1.25*   < >  --   --  2.15*   < > 2.31*  --  2.20*  --  2.13* 2.12*  --   --  1.99*  CALCIUM 8.6*   < >  --    --  7.6*   < > 8.0*  --  8.1*  --  8.1* 8.0*  --   --  7.5*  MG 2.5*  --  3.3*  --  3.4*  --   --   --  3.4*  --  3.6*  --   --   --   --   PHOS  --   --  8.7*  --  11.6*  --   --   --  10.3*  --  6.9*  --   --   --  5.8*   < > = values in this interval not displayed.   GFR: Estimated Creatinine Clearance: 40.7 mL/min (A) (by C-G formula based on SCr of 1.99 mg/dL (H)). Recent Labs  Lab 04/20/20 0251 04/20/20 0251 04/21/20 0345 04/21/20 0345 04/22/20 0543 04/23/20 0248 04/24/20 0913 04/25/20 0500 04/26/20 0427  PROCALCITON <0.10  --  <0.10  --  0.10  --   --   --   --   WBC 16.6*   < > 19.0*   < >  --  21.4* 24.7* 25.1* 17.4*   < > = values in this interval not displayed.    Liver Function Tests: Recent Labs  Lab 04/21/20 0345 04/23/20 0248 04/24/20 0500 04/25/20 0500 04/26/20 0427  AST 76* 85* 104* 95* 60*  ALT 94* 106* 114* 137* 108*  ALKPHOS 102 103 107 151* 151*  BILITOT 1.8* 1.5* 1.1 1.1 1.0  PROT 6.3* 6.3* 5.9* 6.0* 5.4*  ALBUMIN 2.1* 2.1* 1.9* 1.8* 1.5*   No results for input(s): LIPASE, AMYLASE in the last 168 hours. No results for input(s): AMMONIA in the last  168 hours.  ABG    Component Value Date/Time   PHART 7.252 (L) 04/25/2020 2313   PCO2ART 70.9 (HH) 04/25/2020 2313   PO2ART 77 (L) 04/25/2020 2313   HCO3 31.2 (H) 04/25/2020 2313   TCO2 33 (H) 04/25/2020 2313   ACIDBASEDEF 3.0 (H) 04/24/2020 0006   O2SAT 92.0 04/25/2020 2313     Coagulation Profile: No results for input(s): INR, PROTIME in the last 168 hours.  Cardiac Enzymes: No results for input(s): CKTOTAL, CKMB, CKMBINDEX, TROPONINI in the last 168 hours.  HbA1C: Hemoglobin A1C  Date/Time Value Ref Range Status  04/29/2014 03:24 PM 8.8 (H) 4.2 - 6.3 % Final    Comment:    The American Diabetes Association recommends that a primary goal of therapy should be <7% and that physicians should reevaluate the treatment regimen in patients with HbA1c values consistently >8%.    Hgb A1c  MFr Bld  Date/Time Value Ref Range Status  04/10/2020 11:59 AM 7.9 (H) 4.8 - 5.6 % Final    Comment:    (NOTE)         Prediabetes: 5.7 - 6.4         Diabetes: >6.4         Glycemic control for adults with diabetes: <7.0   02/10/2020 09:32 AM 7.3 (H) 4.8 - 5.6 % Final    Comment:    (NOTE) Pre diabetes:          5.7%-6.4%  Diabetes:              >6.4%  Glycemic control for   <7.0% adults with diabetes     CBG: Recent Labs  Lab 04/25/20 1621 04/25/20 1950 04/25/20 2357 04/26/20 0319 04/26/20 0759  GLUCAP 180* 225* 248* 296* 303*     Assessment & Plan:   Acute hypoxic respiratory failure due to COVID-19 PNA E. Coli pneumonia -likely fibrotic component given late stage from initial presentation  As such prognosis for recovery is less favorable and likely to be slow.  He would likely require prolonged mechanical ventilation and tracheostomy and he was advised of this 9/9 -Continue LTV, 4 3 8  cc/kg ideal body weight with goal plateau less than 30 driving pressure less than 15.  Not meeting goals, suspect due to fibrotic component of disease at this stage  -SAT and SBT when appropriate. FiO2 remains too high. -VAP, PAD protocols -s/p remdesivir, baricitinib courses oer protocol. -steroid taper to be completed -Sedation as required to tolerate mechanical ventilation -prone 16 hrs per day until P:F >150  Septic shock-GNR pneumonia Hypothermia due to sepsis -Continue cefepime; deescalate when sensitivties available -Continue vasopressors as required to maintain MAP greater than 65 -Continue stress dose steroids.  AKI- improving Hyperkalemia-resolved Hyperphosphatemia- improving Hypernatremia- improving Azotemia -Appreciate nephrology's input-not requiring dialysis -Strict I's/O; continue Foley catheter given need for prone ventilation -Continue to monitor renal function daily -Renally dose meds and avoid nephrotoxic meds -We will have to continue D5W since  intolerant to enteral free today. 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 or hepatic encephalopathy AST, ALT  Elevated but improving, normal bilirubin -Holding statin currently -Continue to monitor hepatic function periodically  Coronary artery disease status post bypass HTN HLD -holding antihypertensives in setting of shock -ASA -Holding statin  Acute anemia, likely due to critical illness  Acute thrombocytopenia, likely due to critical illness -Transfuse for hemoglobin less than 7 or hemodynamically significant bleeding -Continue to monitor  GoC -meeting this afternoon with palliative care and wife  Daily Goals Checklist  Pain/Anxiety/Delirium protocol (if indicated): fent, versed VAP protocol (if indicated): VAP Blood pressure target:MAP > 65 SBP > 90 DVT prophylaxis: High-dose Lovenox Nutritional status and feeding goals: EN  GI prophylaxis: Pantoprazole. Fluid status goals: euvolemic to net negative  Urinary catheter: foley Central lines: Peripheral IVs only Glucose control: SSI Mobility/therapy needs: BR  Antibiotic de-escalation: No antibiotics Home medication reconciliation: holding home antihypertensives in setting of shock  Daily labs: CBC, BMP Code Status: Full code Family Communication:   wife  Disposition: ICU    This patient is critically ill with multiple organ system failure which requires frequent high complexity decision making, assessment, support, evaluation, and titration of therapies. This was completed through the application of advanced monitoring technologies and extensive interpretation of multiple databases. During this encounter critical care time was devoted to patient care services described in this note for 37  minutes.   Julian Hy, DO 04/26/20 1:20 PM [facility]

## 2020-04-26 NOTE — Progress Notes (Signed)
Daily Progress Note   Patient Name: Alexander Parks       Date: 04/26/2020 DOB: 11-Nov-1950  Age: 69 y.o. MRN#: [id number] Attending Physician: Julian Hy, DO Primary Care Physician: Manon Hilding, MD Admit Date: 04/03/2020  Reason for Consultation/Follow-up: To discuss complex medical decision making related to patient's goals of care  Reviewed chart.  Discussed with ICU RN.  Discussed with Dr. Carlis Abbott.  Viewed patient.  He was hypothermic in the early morning and has a Retail banker in place. Proned position.  Unable to wean from the vent.  Labs slightly improved.  Urine output enough to avoid HD today.  Subjective: Conference call with Jerilee Hoh, Dr. Carlis Abbott and myself.  We discussed his current status. [name] indicated that she feels he would not want a trach/PEG.  Time frames on the vent were discussed.  Sherri asked about the number of people coming off the vent successfully. Dr. Carlis Abbott explained that the [facility] of people with COVID coming off the vent successfully has decreased to less than 50% and is worse than it was 1.5 years ago when COVID started.  She further explained Alexander Parks's bacterial pneumonia in addition to his COVID pneumonia.  We asked about code status.  I expressed that if Alexander Parks's heart were to stop while he is on so much support in the ICU - heroic measures would be unlikely to save him.  Dr. Carlis Abbott addressed the pain that he would likely endure for no benefit.  [name] and Sherri decided that if Alexander Parks's heart were to stop they do not want aggressive resuscitation.  They would like for the medical team to continue full scope treatment along with DNR.  They are hopeful he will improve in the 7 - 10 day time frame on the vent before Lurline Idol has to be re-considered.  Dr. Carlis Abbott stated  that if anything else went wrong (such as his kidney failure became worse and he needed dialysis) we should have more conversations.    At this point we will continue full scope treatment.    He is now DNR.   Assessment: 69 yo male with CAD and Cirrhosis with COVID PNA and Ecoli PNA on ventilator and sedated.   Patient Profile/HPI:  69 y.o. 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  He was originally admitted at [facility] but was transferred to [facility] on 8/31.  He improved and has since worsened again.  He is currently intubated and responsive only to pain. Current xrays show bilateral lung infiltrates and sputum cultures show GNR.  Labs including BUN/creatinine and WBC are trending in the wrong direction.     Length of Stay: 20   Vital Signs: BP (!) 148/59   Pulse 100   Temp 98.8 F (37.1 C) (Axillary)   Resp 19   Ht 6\' 2"  (1.88 m)   Wt 91.3 kg   SpO2 91%   BMI 25.84 kg/m  SpO2: SpO2: 91 % O2 Device: O2 Device: Ventilator O2 Flow Rate: O2 Flow Rate (L/min): 45 L/min (45L HHFNC/15L NRB)       Palliative Assessment/Data: 10%     Palliative Care Plan    Recommendations/Plan:  Code status changed to DNR.  Continue full scope treatment.  Patient would not want trach/PEG  Please update [name] daily.  Code Status:  DNR  Prognosis:   Unable to determine   Discharge Planning:  To Be Determined  Care plan was discussed with CCM, family.  Thank you for allowing the Palliative Medicine Team to assist in the care of this patient.  Total time spent:  35 min.     Greater than 50%  of this time was spent counseling and coordinating care related to the above assessment and plan.  Florentina Jenny, PA-C Palliative Medicine  Please contact Palliative  MedicineTeam phone at [phone] for questions and concerns between 7 am - 7 pm.   Please see AMION for individual provider pager numbers.

## 2020-04-26 NOTE — Procedures (Signed)
Patients head turned to the left.  RT will continue to monitor.

## 2020-04-26 NOTE — Progress Notes (Signed)
RTx2 and RNx4 turned patient supine @1555  without complication. RT will continue to monitor.

## 2020-04-26 NOTE — Progress Notes (Signed)
[facility] Progress Note    Assessment/ Plan:    8M severe MSOF, ARDS. AKI from [place] in ICU, proned, on pressors, septic shock.  1.  AKI: secondary to distributive shock (septic).  Cr 1.01-->2.3 with some hyperkalemia.  Azotemic to 144-158, likely d/t steroids/ free water deficit.  Cr improving, K normalized, off lokelma.  Increase D5W infusion to 100/ hr, also getting FWF.  No hard indication for RRT at present, will continue to closely follow.  UOP 1.2L yesterday.  2.  Acute hypoxic RF 2/2 COVID pneumonia: got remdesivir, baricitinib, steroid taper.  On Cefepime.  Vented per PCCM, proning protocol in place  3.  Hyperkalemia: up to 5.7/ 6.0.  S/p Ca gluconate, bicarb, insulin/ dextrose, one dose of lokelma. Off Lokelma for now.  4.  Shock: likely septic/ distributive, TTE without evidence of PE.  Cultures/ pressors/ antibiotics per PCCM.   Lower extremity dopplers no DVT.  Respiratory culture growing E coli.  5.  Dispo: ICU.  Pall care seeing, family meeting to be arranged.  Subjective:    Renally continues to improve.  Proned.   Objective:   BP (!) 148/59   Pulse 100   Temp 98.8 F (37.1 C) (Axillary)   Resp 19   Ht 6' 2"  (1.88 m)   Wt 91.3 kg   SpO2 91%   BMI 25.84 kg/m   Intake/Output Summary (Last 24 hours) at 04/26/2020 1121 Last data filed at 04/26/2020 1000 Gross per 24 hour  Intake 4835.06 ml  Output 1090 ml  Net 3745.06 ml   Weight change:   Physical Exam see exam from 9/10, not examined today d/t COVID 19 status to limit exposure to other providers and conserve PPE: GEN intubated, sedated HEENT eyes closed NECK no JVD PULM coarse breath sounds bilaterally diffusely CV RRR no m/r/g ABD soft, nontender EXT 1+ LE edema NEURO intubated, sedated SKIN no rashes  Imaging: DG Chest Port 1 View  Result Date: 04/25/2020 CLINICAL DATA:  COVID-19 diagnosed on 03/19/2020, past history coronary artery disease, diabetes mellitus, cirrhosis  EXAM: PORTABLE CHEST 1 VIEW COMPARISON:  Portable exam 1452 hours compared to 0505 hours FINDINGS: Tip of endotracheal tube projects 6.2 cm above carina. Feeding tube extends into stomach. RIGHT jugular central venous catheter with tip projecting over SVC. Normal heart size post CABG. Persistent BILATERAL pulmonary infiltrates greater at LEFT base. No pleural effusion or pneumothorax. IMPRESSION: Persistent diffuse BILATERAL pulmonary infiltrates, not significantly changed. Electronically Signed   By: Lavonia Dana M.D.   On: 04/25/2020 16:51   DG CHEST PORT 1 VIEW  Result Date: 04/25/2020 CLINICAL DATA:  COVID positive.  Respiratory failure EXAM: PORTABLE CHEST 1 VIEW COMPARISON:  04/25/2019 FINDINGS: Endotracheal tube and central venous line unchanged. Removal of NG tube and replacement with feeding tube. Feeding tube extends the stomach. Bibasilar mild airspace disease. No pneumothorax. IMPRESSION: 1. Stable support apparatus. 2. Mild bibasilar airspace disease. 3. Feeding tube extends into the stomach. Electronically Signed   By: Suzy Bouchard M.D.   On: 04/25/2020 07:25   VAS [place] LOWER EXTREMITY VENOUS (DVT)  Result Date: 04/24/2020  Lower Venous DVTStudy Indications: Elevated D-dimer.  Comparison Study: No prior studies. Performing Technologist: Darlin Coco  Examination Guidelines: A complete evaluation includes B-mode imaging, spectral Doppler, color Doppler, and power Doppler as needed of all accessible portions of each vessel. Bilateral testing is considered an integral part of a complete examination. Limited examinations for reoccurring indications may be performed as noted. The reflux portion of  the exam is performed with the patient in reverse Trendelenburg.  +---------+---------------+---------+-----------+----------+--------------+ RIGHT    CompressibilityPhasicitySpontaneityPropertiesThrombus Aging +---------+---------------+---------+-----------+----------+--------------+ CFV       Full           Yes      Yes                                 +---------+---------------+---------+-----------+----------+--------------+ SFJ      Full                                                        +---------+---------------+---------+-----------+----------+--------------+ FV Prox  Full                                                        +---------+---------------+---------+-----------+----------+--------------+ FV Mid   Full                                                        +---------+---------------+---------+-----------+----------+--------------+ FV DistalFull                                                        +---------+---------------+---------+-----------+----------+--------------+ PFV      Full                                                        +---------+---------------+---------+-----------+----------+--------------+ POP      Full           Yes      Yes                                 +---------+---------------+---------+-----------+----------+--------------+ PTV      Full                                                        +---------+---------------+---------+-----------+----------+--------------+ PERO     Full                                                        +---------+---------------+---------+-----------+----------+--------------+   +---------+---------------+---------+-----------+----------+--------------+ LEFT     CompressibilityPhasicitySpontaneityPropertiesThrombus Aging +---------+---------------+---------+-----------+----------+--------------+ CFV      Full           Yes      Yes                                 +---------+---------------+---------+-----------+----------+--------------+  SFJ      Full                                                        +---------+---------------+---------+-----------+----------+--------------+ FV Prox  Full                                                         +---------+---------------+---------+-----------+----------+--------------+ FV Mid   Full                                                        +---------+---------------+---------+-----------+----------+--------------+ FV DistalFull                                                        +---------+---------------+---------+-----------+----------+--------------+ PFV      Full                                                        +---------+---------------+---------+-----------+----------+--------------+ POP      Full           Yes      Yes                                 +---------+---------------+---------+-----------+----------+--------------+ PTV      Full                                                        +---------+---------------+---------+-----------+----------+--------------+ PERO     Full                                                        +---------+---------------+---------+-----------+----------+--------------+     Summary: RIGHT: - There is no evidence of deep vein thrombosis in the lower extremity.  - No cystic structure found in the popliteal fossa.  LEFT: - There is no evidence of deep vein thrombosis in the lower extremity.  - No cystic structure found in the popliteal fossa.  *See table(s) above for measurements and observations. Electronically signed by Monica Martinez MD on 04/24/2020 at 4:43:11 PM.    Final    ECHOCARDIOGRAM LIMITED  Result Date: 04/24/2020    ECHOCARDIOGRAM LIMITED REPORT   Patient Name:   Alexander Parks Date of Exam: 04/24/2020 Medical Rec #:  [id number]  Height:       74.0 in Accession #:    [id number]       Weight:       197.8 lb Date of Birth:  Jan 11, 1951        BSA:          2.163 m Patient Age:    11 years         BP:           133/59 mmHg Patient Gender: M                HR:           77 bpm. Exam Location:  Inpatient Procedure: Limited Color Doppler and Cardiac Doppler Indications:    shock   History:        Patient has prior history of Echocardiogram examinations, most                 recent 06/27/2018. Prior CABG, Covid. cirrhosis; Risk                 Factors:Dyslipidemia and Diabetes.  Sonographer:    Johny Chess Referring Phys: [id number] [place]  1. Left ventricular ejection fraction, by estimation, is 60 to 65%. The left ventricle has normal function. The left ventricle has no regional wall motion abnormalities.  2. Right ventricular systolic function is normal. The right ventricular size is normal. There is normal pulmonary artery systolic pressure.  3. The mitral valve is normal in structure. No evidence of mitral valve regurgitation. No evidence of mitral stenosis.  4. The aortic valve is normal in structure. Aortic valve regurgitation is not visualized. No aortic stenosis is present. FINDINGS  Left Ventricle: Left ventricular ejection fraction, by estimation, is 60 to 65%. The left ventricle has normal function. The left ventricle has no regional wall motion abnormalities. The left ventricular internal cavity size was normal in size. Right Ventricle: The right ventricular size is normal. No increase in right ventricular wall thickness. Right ventricular systolic function is normal. There is normal pulmonary artery systolic pressure. The tricuspid regurgitant velocity is 2.59 m/s, and  with an assumed right atrial pressure of 3 mmHg, the estimated right ventricular systolic pressure is 84.1 mmHg. Mitral Valve: The mitral valve is normal in structure. No evidence of mitral valve stenosis. Tricuspid Valve: The tricuspid valve is normal in structure. Tricuspid valve regurgitation is trivial. Aortic Valve: The aortic valve is normal in structure. Aortic valve regurgitation is not visualized. No aortic stenosis is present. Pulmonic Valve: The pulmonic valve was normal in structure. Pulmonic valve regurgitation is not visualized. LEFT VENTRICLE PLAX 2D LVIDd:         4.90 cm   Diastology LVIDs:         2.60 cm  LV e' medial:    7.07 cm/s LV PW:         0.90 cm  LV E/e' medial:  8.7 LV IVS:        1.00 cm  LV e' lateral:   11.10 cm/s LVOT diam:     2.10 cm  LV E/e' lateral: 5.6 LV SV:         56 LV SV Index:   26 LVOT Area:     3.46 cm  IVC IVC diam: 2.10 cm LEFT ATRIUM         Index LA diam:    3.10 cm 1.43 cm/m  AORTIC VALVE LVOT Vmax:   73.10 cm/s LVOT Vmean:  50.000 cm/s LVOT VTI:    0.161 m  AORTA Ao Root diam: 3.90 cm Ao Asc diam:  3.60 cm MITRAL VALVE               TRICUSPID VALVE MV Area (PHT): 3.27 cm    TR Peak grad:   26.8 mmHg MV Decel Time: 232 msec    TR Vmax:        259.00 cm/s MV E velocity: 61.70 cm/s MV A velocity: 70.70 cm/s  SHUNTS MV E/A ratio:  0.87        Systemic VTI:  0.16 m                            Systemic Diam: 2.10 cm Mertie Moores MD Electronically signed by Mertie Moores MD Signature Date/Time: 04/24/2020/3:42:03 PM    Final     Labs: BMET Recent Labs  Lab 04/23/20 0248 04/23/20 1805 04/23/20 2103 04/23/20 2354 04/24/20 0500 04/24/20 0500 04/24/20 1311 04/24/20 1355 04/24/20 1646 04/24/20 1815 04/25/20 0500 04/25/20 0720 04/25/20 1429 04/25/20 2313 04/26/20 0427  NA 136   < >  --    < > 142   < > 146*   < > 146* 146* 148* 150* 147* 148* 147*  K 4.2   < >  --    < > 6.0*   < > 5.7*   < > 6.0* 5.6* 4.6 4.5 4.3 4.4 4.4  CL 101  --   --   --  108  --  106  --  108  --  109 110  --   --  109  CO2 22  --   --   --  19*  --  24  --  21*  --  27 28  --   --  27  GLUCOSE 108*  --   --   --  225*  --  246*  --  234*  --  146* 168*  --   --  333*  BUN 119*  --   --   --  138*  --  144*  --  146*  --  155* 155*  --   --  158*  CREATININE 1.31*  --   --   --  2.15*  --  2.31*  --  2.20*  --  2.13* 2.12*  --   --  1.99*  CALCIUM 8.3*  --   --   --  7.6*  --  8.0*  --  8.1*  --  8.1* 8.0*  --   --  7.5*  PHOS  --   --  8.7*  --  11.6*  --   --   --  10.3*  --  6.9*  --   --   --  5.8*   < > = values in this interval not displayed.    CBC Recent Labs  Lab 04/20/20 0251 04/20/20 0251 04/21/20 0345 04/21/20 0345 04/23/20 0248 04/23/20 1805 04/24/20 0913 04/24/20 1355 04/25/20 0500 04/25/20 1429 04/25/20 2313 04/26/20 0427  WBC 16.6*   < > 19.0*   < > 21.4*  --  24.7*  --  25.1*  --   --  17.4*  NEUTROABS 14.7*  --  16.6*  --   --   --   --   --   --   --   --   --   HGB 12.6*   < >  12.2*   < > 11.7*   < > 11.9*   < > 11.0* 10.9* 10.2* 9.9*  HCT 38.1*   < > 37.0*   < > 35.8*   < > 40.0   < > 36.5* 32.0* 30.0* 33.4*  MCV 84.1   < > 84.3   < > 84.6  --  94.3  --  92.2  --   --  95.7  PLT 157   < > 185   < > 147*  --  165  --  173  --   --  101*   < > = values in this interval not displayed.    Medications:    . vitamin C  500 mg Per Tube Daily  . aspirin  81 mg Per Tube Daily  . chlorhexidine gluconate (MEDLINE KIT)  15 mL Mouth Rinse BID  . Chlorhexidine Gluconate Cloth  6 each Topical Daily  . docusate  100 mg Per Tube BID  . feeding supplement (PROSource TF)  45 mL Per Tube QID  . free water  200 mL Per Tube Q2H  . heparin injection (subcutaneous)  5,000 Units Subcutaneous Q8H  . hydrocortisone sod succinate (SOLU-CORTEF) inj  50 mg Intravenous Q6H  . insulin aspart  0-20 Units Subcutaneous Q4H  . insulin detemir  25 Units Subcutaneous BID  . mouth rinse  15 mL Mouth Rinse 10 times per day  . pantoprazole (PROTONIX) IV  40 mg Intravenous Daily  . polyethylene glycol  17 g Per Tube Daily  . sevelamer carbonate  0.8 g Per Tube TID WC  . sodium chloride flush  10-40 mL Intracatheter Q12H      Madelon Lips MD 04/26/2020, 11:21 AM

## 2020-04-27 ENCOUNTER — Inpatient Hospital Stay (HOSPITAL_COMMUNITY): Payer: Medicare HMO

## 2020-04-27 DIAGNOSIS — J9602 Acute respiratory failure with hypercapnia: Secondary | ICD-10-CM

## 2020-04-27 LAB — POCT I-STAT 7, (LYTES, BLD GAS, ICA,H+H)
Acid-Base Excess: 3 mmol/L — ABNORMAL HIGH (ref 0.0–2.0)
Bicarbonate: 32.2 mmol/L — ABNORMAL HIGH (ref 20.0–28.0)
Calcium, Ion: 1.06 mmol/L — ABNORMAL LOW (ref 1.15–1.40)
HCT: 28 % — ABNORMAL LOW (ref 39.0–52.0)
Hemoglobin: 9.5 g/dL — ABNORMAL LOW (ref 13.0–17.0)
O2 Saturation: 97 %
Patient temperature: 98.5
Potassium: 4.4 mmol/L (ref 3.5–5.1)
Sodium: 147 mmol/L — ABNORMAL HIGH (ref 135–145)
TCO2: 35 mmol/L — ABNORMAL HIGH (ref 22–32)
pCO2 arterial: 79.3 mmHg (ref 32.0–48.0)
pH, Arterial: 7.215 — ABNORMAL LOW (ref 7.350–7.450)
pO2, Arterial: 114 mmHg — ABNORMAL HIGH (ref 83.0–108.0)

## 2020-04-27 LAB — COMPREHENSIVE METABOLIC PANEL
ALT: 99 U/L — ABNORMAL HIGH (ref 0–44)
AST: 59 U/L — ABNORMAL HIGH (ref 15–41)
Albumin: 1.4 g/dL — ABNORMAL LOW (ref 3.5–5.0)
Alkaline Phosphatase: 160 U/L — ABNORMAL HIGH (ref 38–126)
Anion gap: 7 (ref 5–15)
BUN: 170 mg/dL — ABNORMAL HIGH (ref 8–23)
CO2: 29 mmol/L (ref 22–32)
Calcium: 7.1 mg/dL — ABNORMAL LOW (ref 8.9–10.3)
Chloride: 110 mmol/L (ref 98–111)
Creatinine, Ser: 2.31 mg/dL — ABNORMAL HIGH (ref 0.61–1.24)
GFR calc Af Amer: 32 mL/min — ABNORMAL LOW (ref >60)
GFR calc non Af Amer: 28 mL/min — ABNORMAL LOW (ref >60)
Glucose, Bld: 267 mg/dL — ABNORMAL HIGH (ref 70–99)
Potassium: 4.5 mmol/L (ref 3.5–5.1)
Sodium: 146 mmol/L — ABNORMAL HIGH (ref 135–145)
Total Bilirubin: 0.8 mg/dL (ref 0.3–1.2)
Total Protein: 5.1 g/dL — ABNORMAL LOW (ref 6.5–8.1)

## 2020-04-27 LAB — CBC
HCT: 31.6 % — ABNORMAL LOW (ref 39.0–52.0)
Hemoglobin: 8.9 g/dL — ABNORMAL LOW (ref 13.0–17.0)
MCH: 27.4 pg (ref 26.0–34.0)
MCHC: 28.2 g/dL — ABNORMAL LOW (ref 30.0–36.0)
MCV: 97.2 fL (ref 80.0–100.0)
Platelets: 79 10*3/uL — ABNORMAL LOW (ref 150–400)
RBC: 3.25 MIL/uL — ABNORMAL LOW (ref 4.22–5.81)
RDW: 18.3 % — ABNORMAL HIGH (ref 11.5–15.5)
WBC: 15.8 10*3/uL — ABNORMAL HIGH (ref 4.0–10.5)
nRBC: 0.8 % — ABNORMAL HIGH (ref 0.0–0.2)

## 2020-04-27 LAB — GLUCOSE, CAPILLARY
Glucose-Capillary: 259 mg/dL — ABNORMAL HIGH (ref 70–99)
Glucose-Capillary: 251 mg/dL — ABNORMAL HIGH (ref 70–99)
Glucose-Capillary: 282 mg/dL — ABNORMAL HIGH (ref 70–99)
Glucose-Capillary: 228 mg/dL — ABNORMAL HIGH (ref 70–99)
Glucose-Capillary: 229 mg/dL — ABNORMAL HIGH (ref 70–99)
Glucose-Capillary: 211 mg/dL — ABNORMAL HIGH (ref 70–99)
Glucose-Capillary: 214 mg/dL — ABNORMAL HIGH (ref 70–99)
Glucose-Capillary: 190 mg/dL — ABNORMAL HIGH (ref 70–99)
Glucose-Capillary: 182 mg/dL — ABNORMAL HIGH (ref 70–99)
Glucose-Capillary: 165 mg/dL — ABNORMAL HIGH (ref 70–99)
Glucose-Capillary: 160 mg/dL — ABNORMAL HIGH (ref 70–99)

## 2020-04-27 MED ORDER — DEXTROSE 50 % IV SOLN: 0.0000 mL | INTRAVENOUS | Status: DC | PRN

## 2020-04-27 MED ORDER — INSULIN REGULAR(HUMAN) IN NACL 100-0.9 UT/100ML-% IV SOLN
INTRAVENOUS | Status: DC
  Administered 2020-04-27: 8.5 [IU]/h via INTRAVENOUS
  Administered 2020-04-27: 3.6 [IU]/h via INTRAVENOUS
  Filled 2020-04-27 (×2): qty 100

## 2020-04-27 MED ORDER — SODIUM CHLORIDE 0.9 % IV SOLN
2.0000 g | INTRAVENOUS | Status: DC
  Administered 2020-04-27 – 2020-04-30 (×4): 2 g via INTRAVENOUS
  Filled 2020-04-27 (×3): qty 20

## 2020-04-27 NOTE — Progress Notes (Signed)
Upon assessment it was noted that Insulin drip was not hooked up to pt and not infusing to pt. Endotool stopped and restarted from start for accurate blood sugar control.

## 2020-04-27 NOTE — Progress Notes (Signed)
Daily Progress Note   Patient Name: Alexander Parks       Date: 04/27/2020 DOB: 1951/01/13  Age: 69 y.o. MRN#: [id number] Attending Physician: Julian Hy, DO Primary Care Physician: Manon Hilding, MD Admit Date: 03/23/2020  Reason for Consultation/Follow-up:  To discuss complex medical decision making related to patient's goals of care  Chart reviewed.  Communicated with Dr. Carlis Abbott of [facility]   Subjective: Spoke with wife Alexander Parks on the phone.  Alexander Parks is praying for improvement.   I explained that his kidneys are getting worse again rather than better.  Alexander Parks's voice broke as she started to cry.   I advised that if the kidneys trend badly over the next several days Alexander Parks may want to consider whether or not she would want to come see Alexander Parks.  It would be an opportunity for 2 family members to come and say good bye.  Alexander Parks understood.  She stated that if it comes to that her daughter Alexander Parks would want to come visit Alexander Parks with her.    We will pray for Alexander Parks's improvement along with Alexander Parks.   Assessment: 69 yo male, CAD, cirrhosis, COVID 19 PNA, Ecoli PNA, Acute renal failure.  Intubated and sedated.   Patient Profile/HPI:  69 y.o. 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  He was originally admitted at [facility] but was transferred to [facility] on 8/31.  He improved and has since worsened again.  He is currently intubated and responsive only to pain. Current xrays show bilateral lung infiltrates and sputum cultures show GNR.  Labs including BUN/creatinine and WBC are trending in the wrong  direction.    Length of Stay: 21   Vital Signs: BP (!) 131/55   Pulse 73   Temp (!) 95.4 F (35.2 C) (Rectal)   Resp 12   Ht 6\' 2"  (1.88 m)   Wt 91.3 kg   SpO2 93%   BMI 25.84 kg/m  SpO2: SpO2: 93 % O2 Device: O2 Device: Ventilator O2 Flow Rate: O2 Flow Rate (L/min): 45 L/min (45L HHFNC/15L NRB)       Palliative Assessment/Data:  10%     Palliative Care Plan    Recommendations/Plan:  PMT will continue to check in and communicate with family offering support and information  Code Status:  DNR  Prognosis:   Unable to determine   Very high risk for acute decline or death given prolonged hospitalization with out overall improvement, respiratory failure secondary to COVID pna and renal failure   Discharge Planning:  To Be Determined  Care plan was discussed with Dr. Carlis Abbott  Thank you for allowing the Palliative Medicine Team to assist in the care of this patient.  Total time spent:  25 min.     Greater than 50%  of this time was spent counseling and coordinating care related to the above assessment and plan.  Florentina Jenny, PA-C Palliative Medicine  Please contact Palliative MedicineTeam phone at [phone] for questions and concerns between 7 am - 7 pm.   Please see AMION for individual provider pager numbers.

## 2020-04-27 NOTE — Procedures (Signed)
Patient was placed in the prone position at 8257 without complication.  RT will continue to monitor.

## 2020-04-27 NOTE — Progress Notes (Signed)
Patient placed in supine position by RT x 2 and RN x 3 without complications.  ETT re-secured with a commercial tube holder at 23 cm on the left.

## 2020-04-27 NOTE — Procedures (Signed)
Patients head turned to the left, tolerated well.  RT will continue to monitor.

## 2020-04-27 NOTE — Progress Notes (Signed)
Wife, [name], updated by phone.  All questions answered.     Kennieth Rad, MSN, AGACNP-BC [facility] 04/27/2020, 5:35 PM  See Amion for personal pager PCCM on call pager [phone]

## 2020-04-27 NOTE — Progress Notes (Signed)
NAME:  Alexander Parks, MRN:  [id number], DOB:  1951-03-03, LOS: 21 ADMISSION DATE:  04/01/2020, CONSULTATION DATE: 04/23/2020 REFERRING MD: Elgergawy-TRH, CHIEF COMPLAINT: Hypoxia  HPI/course in hospital  69 year old man with worsening hypoxia related to COVID-19 pneumonia. 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  Admitted to [facility] 8/23.  Transfer to ICU [facility] 8/31 required BiPAP.  Transferred back to hospitalist service 9/3.  On 9/8 overnight, placed on BiPAP for worsening hypoxia, failed requiring intubation 9/9.   Past Medical History   Past Medical History:  Diagnosis Date  . Allergy   . Cirrhosis ([place])   . Coronary artery disease   . Diabetes mellitus without complication ([place])   . History of kidney stones       Significant diagnostic tests:  Echocardiogram 9/10: LVEF 60 to 65%, no regional wall motion abnormalities.  Normal RV.  CXR 9/11-bilateral lower lobe infiltrates  9/10 BLE venous duplex >> negative for DVT  Procedures:  9/6 ETT >> 9/10 R IJ >> 9/10 right radial Aline >> 9/10 cortrak >> Foley    Micro:  8/23 Covid + 8/24 MRSA neg 8/23 BCx2 >> neg 9/10 trach asp >> E. Coli 9/10 BCx2 >> ngtd  Antimicrobials:  Cefepime 9/10 >>  Baricitinib  8/24 >> 9/6 remdesivir 8/23 >>8/27 Solumedrol 8/23 >>9/11   Interim history/subjective:  Remains proned on FiO2 90%/ PEEP 15 Worsening renal function, UOP 835 ml/ 24hr +189/ net +4.1L  Remains on NE 24 mcg/min and vasopressin tmax 101.8 -> 95.6 First BM today in 1 wk, vomited overnight TF looking emesis, TF on hold since yesterday   Objective   Blood pressure (!) 131/55, pulse 73, temperature (!) 95.4 F (35.2 C), temperature source Rectal, resp. rate 12, height 6\' 2"  (1.88 m), weight 91.3 kg, SpO2 93 %.    Vent Mode: PRVC FiO2 (%):  [90 %-100 %] 90 % Set Rate:  [35 bmp] 35 bmp Vt Set:  [490  mL] 490 mL PEEP:  [15 cmH20] 15 cmH20 Plateau Pressure:  [26 cmH20-35 cmH20] 32 cmH20  At 6 cc/kg/IBW  Intake/Output Summary (Last 24 hours) at 04/27/2020 1220 Last data filed at 04/27/2020 1000 Gross per 24 hour  Intake 3895.85 ml  Output 885 ml  Net 3010.85 ml   Filed Weights   04/24/20 0500 04/25/20 0500 04/27/20 0500  Weight: 89.7 kg 91.3 kg 91.3 kg    Physical Exam General:  Critically ill elderly male intubated, sedated and proned HEENT: right cortrak, ETT Neuro: RASS -5, sedated CV: NSR PULM:  MV supported breaths, synchronous, minimal trach secretions, coarse, plateau 32, driving pressure 17 GI: +BM, foley  Extremities: warm/dry, +1 generalized edema  Skin: no rashes, pale  Ancillary tests (personally reviewed)  CBC: Recent Labs  Lab 04/21/20 0345 04/21/20 0345 04/23/20 0248 04/23/20 1805 04/24/20 0913 04/24/20 1355 04/25/20 0500 04/25/20 1429 04/25/20 2313 04/26/20 0427 04/26/20 1657 04/27/20 0210 04/27/20 0321  WBC 19.0*   < > 21.4*  --  24.7*  --  25.1*  --   --  17.4*  --   --  15.8*  NEUTROABS 16.6*  --   --   --   --   --   --   --   --   --   --   --   --   HGB 12.2*   < > 11.7*   < > 11.9*   < >  11.0*   < > 10.2* 9.9* 10.2* 9.5* 8.9*  HCT 37.0*   < > 35.8*   < > 40.0   < > 36.5*   < > 30.0* 33.4* 30.0* 28.0* 31.6*  MCV 84.3   < > 84.6  --  94.3  --  92.2  --   --  95.7  --   --  97.2  PLT 185   < > 147*  --  165  --  173  --   --  101*  --   --  79*   < > = values in this interval not displayed.    Basic Metabolic Panel: Recent Labs  Lab 04/21/20 0345 04/23/20 0248 04/23/20 2103 04/23/20 2354 04/24/20 0500 04/24/20 1311 04/24/20 1646 04/24/20 1815 04/25/20 0500 04/25/20 0500 04/25/20 0720 04/25/20 1429 04/25/20 2313 04/26/20 0427 04/26/20 1657 04/27/20 0210 04/27/20 0321  NA 132*   < >  --    < > 142   < > 146*   < > 148*   < > 150*   < > 148* 147* 149* 147* 146*  K 4.1   < >  --    < > 6.0*   < > 6.0*   < > 4.6   < > 4.5   < >  4.4 4.4 4.9 4.4 4.5  CL 96*   < >  --   --  108   < > 108  --  109  --  110  --   --  109  --   --  110  CO2 24   < >  --   --  19*   < > 21*  --  27  --  28  --   --  27  --   --  29  GLUCOSE 106*   < >  --   --  225*   < > 234*  --  146*  --  168*  --   --  333*  --   --  267*  BUN 90*   < >  --   --  138*   < > 146*  --  155*  --  155*  --   --  158*  --   --  170*  CREATININE 1.25*   < >  --   --  2.15*   < > 2.20*  --  2.13*  --  2.12*  --   --  1.99*  --   --  2.31*  CALCIUM 8.6*   < >  --   --  7.6*   < > 8.1*  --  8.1*  --  8.0*  --   --  7.5*  --   --  7.1*  MG 2.5*  --  3.3*  --  3.4*  --  3.4*  --  3.6*  --   --   --   --   --   --   --   --   PHOS  --   --  8.7*  --  11.6*  --  10.3*  --  6.9*  --   --   --   --  5.8*  --   --   --    < > = values in this interval not displayed.   GFR: Estimated Creatinine Clearance: 35.1 mL/min (A) (by C-G formula based on SCr of 2.31 mg/dL (H)). Recent Labs  Lab 04/21/20 0345 04/22/20  0543 04/23/20 0248 04/24/20 0913 04/25/20 0500 04/26/20 0427 04/27/20 0321  PROCALCITON <0.10 0.10  --   --   --   --   --   WBC 19.0*  --    < > 24.7* 25.1* 17.4* 15.8*   < > = values in this interval not displayed.    Liver Function Tests: Recent Labs  Lab 04/23/20 0248 04/24/20 0500 04/25/20 0500 04/26/20 0427 04/27/20 0321  AST 85* 104* 95* 60* 59*  ALT 106* 114* 137* 108* 99*  ALKPHOS 103 107 151* 151* 160*  BILITOT 1.5* 1.1 1.1 1.0 0.8  PROT 6.3* 5.9* 6.0* 5.4* 5.1*  ALBUMIN 2.1* 1.9* 1.8* 1.5* 1.4*   No results for input(s): LIPASE, AMYLASE in the last 168 hours. No results for input(s): AMMONIA in the last 168 hours.  ABG    Component Value Date/Time   PHART 7.215 (L) 04/27/2020 0210   PCO2ART 79.3 (HH) 04/27/2020 0210   PO2ART 114 (H) 04/27/2020 0210   HCO3 32.2 (H) 04/27/2020 0210   TCO2 35 (H) 04/27/2020 0210   ACIDBASEDEF 3.0 (H) 04/24/2020 0006   O2SAT 97.0 04/27/2020 0210     Coagulation Profile: No results for  input(s): INR, PROTIME in the last 168 hours.  Cardiac Enzymes: No results for input(s): CKTOTAL, CKMB, CKMBINDEX, TROPONINI in the last 168 hours.  HbA1C: Hemoglobin A1C  Date/Time Value Ref Range Status  04/29/2014 03:24 PM 8.8 (H) 4.2 - 6.3 % Final    Comment:    The American Diabetes Association recommends that a primary goal of therapy should be <7% and that physicians should reevaluate the treatment regimen in patients with HbA1c values consistently >8%.    Hgb A1c MFr Bld  Date/Time Value Ref Range Status  03/20/2020 11:59 AM 7.9 (H) 4.8 - 5.6 % Final    Comment:    (NOTE)         Prediabetes: 5.7 - 6.4         Diabetes: >6.4         Glycemic control for adults with diabetes: <7.0   02/10/2020 09:32 AM 7.3 (H) 4.8 - 5.6 % Final    Comment:    (NOTE) Pre diabetes:          5.7%-6.4%  Diabetes:              >6.4%  Glycemic control for   <7.0% adults with diabetes     CBG: Recent Labs  Lab 04/26/20 1926 04/26/20 2318 04/27/20 0316 04/27/20 0855 04/27/20 1127  GLUCAP 258* 242* 259* 251* 282*     Assessment & Plan:   Acute hypoxic respiratory failure due to COVID-19 PNA E. Coli pneumonia -likely fibrotic component given late stage from initial presentation  Continue mechanical ventilation, LTVV, target TVol 6-8cc/kgIBW Target Plateau Pressure < 30cm H20; driving pressure less than 15 cm of water  Target PaO2 55-65: titrate PEEP/ FiO2 per protocol Continue proning for PF ratio is less than 1:150 position for 16 hours a day Ventilator associated pneumonia prevention protocol Trend CXR/ ABG PAD protocol with fentanyl/ versed gtt w/ bowel regimen Based on PMT discussions with family 9/12, patient would not want long term support/ trach; remains on significant vent support   Septic shock- E. Coli pneumonia Hypothermia due to sepsis Continue cefepime Continue NE and vasopressin for MAP goal > 65 Continue stress dose steroids Follow BC/ trend WBC/ fever  curve  AKI- in the setting of septic shock Hyperkalemia-resolved Hyperphosphatemia- improving Hypernatremia- improving Azotemia Appreciate  Nephrology's input.  Scr 1.99-> 2.31/ BUN 158-> 170 worse today, still with UOP ~0.4 ml/kg/hr.  No emergent indications for iHD today.  Would be a poor candidate given patient's wishes of no long term support.  Will need to continue [place] with family given MODS - continue foley/ strict I/Os, daily weights - trend daily renal indices - reduce D5W to 75 ml/hr (remains NPO)  Dm2 with hyperglycemia  - remains uncontrolled despite add levemir yesterday.  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 disease or hepatic encephalopathy - AST, ALT continue to improve, trend LFTs -Holding statin currently  Coronary artery disease status post bypass HTN HLD - holding antihypertensives in setting of shock - ASA -Holding statin as above  Acute normocytic anemia, likely due to critical illness Acute thrombocytopenia, likely due to critical illness -Transfuse for hemoglobin less than 7 or hemodynamically significant bleeding -Continue to monitor  Protein calorie malnutrition  - TF when able  Emesis x 2 with constipation  - small BM 9/13 - continue NPO for now - Abd XR when supine today at 1600 to r/o ileus - cortrak to be placed post pyloric   Daily Goals Checklist  Pain/Anxiety/Delirium protocol (if indicated): fent, versed VAP protocol (if indicated): VAP Blood pressure target:MAP > 65 SBP > 90 DVT prophylaxis: High-dose Lovenox Nutritional status and feeding goals: EN - currently on hold GI prophylaxis: Pantoprazole. Fluid status goals: euvolemic to net negative  Urinary catheter: foley Central lines: Peripheral IVs only Glucose control: SSI Mobility/therapy needs: BR  Antibiotic de-escalation: No antibiotics Home medication reconciliation: holding home antihypertensives in setting of shock  Daily  labs: CBC, BMP Code Status: DNR as of 9/12 Family Communication:  pending Disposition: ICU    This patient is critically ill with multiple organ system failure which requires frequent high complexity decision making, assessment, support, evaluation, and titration of therapies. This was completed through the application of advanced monitoring technologies and extensive interpretation of multiple databases. During this encounter critical care time was devoted to patient care services described in this note for  50 minutes.   Kennieth Rad, MSN, AGACNP-BC [facility] 04/27/2020, 12:20 PM  See Amion for personal pager PCCM on call pager [phone]

## 2020-04-27 NOTE — Progress Notes (Addendum)
[facility] Progress Note    Assessment/ Plan:    89M severe MSOF, ARDS. AKI from [place] in ICU, proned, on pressors, septic shock.  1.  AKI: secondary to distributive shock (septic).  Cr 1.01-->2.3 with some hyperkalemia.  Azotemic and this was previously felt likely d/t steroids/ free water deficit. K normalized, off lokelma.   - RRT has been held thus far  - Will discuss with pulm   2. Covid PNA - therapies per pulm/critical care     3.  Acute hypoxic RF 2/2 COVID pneumonia: got remdesivir, baricitinib, steroid taper.  On Cefepime.  Vented per PCCM, proning protocol in place  4.  Hyperkalemia: s/p ca gluconate, bicarb, insulin/ dextrose, lokelma. Off Lokelma for now.   5.  Shock: likely septic/ distributive, TTE without evidence of PE.  Cultures/ pressors/ antibiotics per PCCM.   Lower extremity dopplers no DVT.  Respiratory culture growing E coli.  6. Hypernatremia  - On D5W at 100 ml/hr - also getting FWF; hyperglycemia per primary team   7. Anemia normocytic - no acute indication for PRBC's  Dispo: ICU. Appreciate pulm and palliative care   Subjective:    Pt had 850 mL uop over 9/12 charted.  Proned.  Has been on levo and vasopressin.  Examined with appropriate PPE.  Review of systems:  Unable to be obtained secondary to obtunded    Objective:   BP (!) 139/58   Pulse 76   Temp 99.9 F (37.7 C) (Axillary)   Resp (!) 35   Ht _0  (1.88 m)   Wt 91.3 kg   SpO2 96%   BMI 25.84 kg/m   Intake/Output Summary (Last 24 hours) at 04/27/2020 0801 Last data filed at 04/27/2020 0700 Gross per 24 hour  Intake 3870.85 ml  Output 835 ml  Net 3035.85 ml   Weight change:   Physical Exam  GEN intubated, sedated HEENT eyes closed NECK no JVD PULM proned. No crackles bilateral bases and lungs with reduced but clear breath sounds CV S1S2 no rub ABD soft, nontender EXT trace LE edema NEURO intubated, sedated  Imaging: DG Chest Port 1 View  Result  Date: 04/25/2020 CLINICAL DATA:  COVID-19 diagnosed on 03/25/2020, past history coronary artery disease, diabetes mellitus, cirrhosis EXAM: PORTABLE CHEST 1 VIEW COMPARISON:  Portable exam 1452 hours compared to 0505 hours FINDINGS: Tip of endotracheal tube projects 6.2 cm above carina. Feeding tube extends into stomach. RIGHT jugular central venous catheter with tip projecting over SVC. Normal heart size post CABG. Persistent BILATERAL pulmonary infiltrates greater at LEFT base. No pleural effusion or pneumothorax. IMPRESSION: Persistent diffuse BILATERAL pulmonary infiltrates, not significantly changed. Electronically Signed   By: Lavonia Dana M.D.   On: 04/25/2020 16:51    Labs: BMET Recent Labs  Lab 04/23/20 2103 04/23/20 2354 04/24/20 0500 04/24/20 0500 04/24/20 1311 04/24/20 1355 04/24/20 1646 04/24/20 1815 04/25/20 0500 04/25/20 0500 04/25/20 0720 04/25/20 1429 04/25/20 2313 04/26/20 0427 04/26/20 1657 04/27/20 0210 04/27/20 0321  NA  --    < > 142   < > 146*   < > 146*   < > 148*   < > 150* 147* 148* 147* 149* 147* 146*  K  --    < > 6.0*   < > 5.7*   < > 6.0*   < > 4.6   < > 4.5 4.3 4.4 4.4 4.9 4.4 4.5  CL  --   --  108  --  106  --  108  --  109  --  110  --   --  109  --   --  110  CO2  --   --  19*  --  24  --  21*  --  27  --  28  --   --  27  --   --  29  GLUCOSE  --   --  225*  --  246*  --  234*  --  146*  --  168*  --   --  333*  --   --  267*  BUN  --   --  138*  --  144*  --  146*  --  155*  --  155*  --   --  158*  --   --  170*  CREATININE  --   --  2.15*  --  2.31*  --  2.20*  --  2.13*  --  2.12*  --   --  1.99*  --   --  2.31*  CALCIUM  --   --  7.6*  --  8.0*  --  8.1*  --  8.1*  --  8.0*  --   --  7.5*  --   --  7.1*  PHOS 8.7*  --  11.6*  --   --   --  10.3*  --  6.9*  --   --   --   --  5.8*  --   --   --    < > = values in this interval not displayed.   CBC Recent Labs  Lab 04/21/20 0345 04/23/20 0248 04/24/20 0913 04/24/20 1355 04/25/20 0500  04/25/20 1429 04/26/20 0427 04/26/20 1657 04/27/20 0210 04/27/20 0321  WBC 19.0*   < > 24.7*  --  25.1*  --  17.4*  --   --  15.8*  NEUTROABS 16.6*  --   --   --   --   --   --   --   --   --   HGB 12.2*   < > 11.9*   < > 11.0*   < > 9.9* 10.2* 9.5* 8.9*  HCT 37.0*   < > 40.0   < > 36.5*   < > 33.4* 30.0* 28.0* 31.6*  MCV 84.3   < > 94.3  --  92.2  --  95.7  --   --  97.2  PLT 185   < > 165  --  173  --  101*  --   --  79*   < > = values in this interval not displayed.    Medications:    . vitamin C  500 mg Per Tube Daily  . aspirin  81 mg Per Tube Daily  . chlorhexidine gluconate (MEDLINE KIT)  15 mL Mouth Rinse BID  . Chlorhexidine Gluconate Cloth  6 each Topical Daily  . docusate  100 mg Per Tube BID  . feeding supplement (PROSource TF)  45 mL Per Tube QID  . free water  200 mL Per Tube Q2H  . heparin injection (subcutaneous)  5,000 Units Subcutaneous Q8H  . hydrocortisone sod succinate (SOLU-CORTEF) inj  50 mg Intravenous Q6H  . insulin aspart  0-20 Units Subcutaneous Q4H  . insulin detemir  25 Units Subcutaneous BID  . mouth rinse  15 mL Mouth Rinse 10 times per day  . pantoprazole (PROTONIX) IV  40 mg Intravenous Daily  . polyethylene glycol  17 g  Per Tube Daily  . sevelamer carbonate  0.8 g Per Tube TID WC  . sodium chloride flush  10-40 mL Intracatheter Q12H     Claudia Desanctis MD 04/27/2020, 8:42 AM  Discussed with pulm.  She states family has expressed that for instance he wouldn't want a trach and they didn't feel that HD was something that he would've wanted on her prior discussions with family.  Defer RRT at this time   Claudia Desanctis 04/27/2020 8:49 AM

## 2020-04-28 DIAGNOSIS — R0902 Hypoxemia: Secondary | ICD-10-CM

## 2020-04-28 LAB — POCT I-STAT 7, (LYTES, BLD GAS, ICA,H+H)
Acid-Base Excess: 1 mmol/L (ref 0.0–2.0)
Acid-base deficit: 2 mmol/L (ref 0.0–2.0)
Acid-base deficit: 4 mmol/L — ABNORMAL HIGH (ref 0.0–2.0)
Bicarbonate: 23.4 mmol/L (ref 20.0–28.0)
Bicarbonate: 24.3 mmol/L (ref 20.0–28.0)
Bicarbonate: 29.8 mmol/L — ABNORMAL HIGH (ref 20.0–28.0)
Calcium, Ion: 0.89 mmol/L — CL (ref 1.15–1.40)
Calcium, Ion: 0.94 mmol/L — ABNORMAL LOW (ref 1.15–1.40)
Calcium, Ion: 1.05 mmol/L — ABNORMAL LOW (ref 1.15–1.40)
HCT: 21 % — ABNORMAL LOW (ref 39.0–52.0)
HCT: 22 % — ABNORMAL LOW (ref 39.0–52.0)
HCT: 28 % — ABNORMAL LOW (ref 39.0–52.0)
Hemoglobin: 7.1 g/dL — ABNORMAL LOW (ref 13.0–17.0)
Hemoglobin: 7.5 g/dL — ABNORMAL LOW (ref 13.0–17.0)
Hemoglobin: 9.5 g/dL — ABNORMAL LOW (ref 13.0–17.0)
O2 Saturation: 78 %
O2 Saturation: 86 %
O2 Saturation: 93 %
Patient temperature: 98.4
Patient temperature: 98.9
Potassium: 3.4 mmol/L — ABNORMAL LOW (ref 3.5–5.1)
Potassium: 4.1 mmol/L (ref 3.5–5.1)
Potassium: 4.1 mmol/L (ref 3.5–5.1)
Sodium: 142 mmol/L (ref 135–145)
Sodium: 143 mmol/L (ref 135–145)
Sodium: 147 mmol/L — ABNORMAL HIGH (ref 135–145)
TCO2: 25 mmol/L (ref 22–32)
TCO2: 26 mmol/L (ref 22–32)
TCO2: 32 mmol/L (ref 22–32)
pCO2 arterial: 47.4 mmHg (ref 32.0–48.0)
pCO2 arterial: 53.5 mmHg — ABNORMAL HIGH (ref 32.0–48.0)
pCO2 arterial: 69.4 mmHg (ref 32.0–48.0)
pH, Arterial: 7.241 — ABNORMAL LOW (ref 7.350–7.450)
pH, Arterial: 7.249 — ABNORMAL LOW (ref 7.350–7.450)
pH, Arterial: 7.318 — ABNORMAL LOW (ref 7.350–7.450)
pO2, Arterial: 46 mmHg — ABNORMAL LOW (ref 83.0–108.0)
pO2, Arterial: 61 mmHg — ABNORMAL LOW (ref 83.0–108.0)
pO2, Arterial: 82 mmHg — ABNORMAL LOW (ref 83.0–108.0)

## 2020-04-28 LAB — GLUCOSE, CAPILLARY
Glucose-Capillary: 114 mg/dL — ABNORMAL HIGH (ref 70–99)
Glucose-Capillary: 115 mg/dL — ABNORMAL HIGH (ref 70–99)
Glucose-Capillary: 116 mg/dL — ABNORMAL HIGH (ref 70–99)
Glucose-Capillary: 118 mg/dL — ABNORMAL HIGH (ref 70–99)
Glucose-Capillary: 120 mg/dL — ABNORMAL HIGH (ref 70–99)
Glucose-Capillary: 125 mg/dL — ABNORMAL HIGH (ref 70–99)
Glucose-Capillary: 126 mg/dL — ABNORMAL HIGH (ref 70–99)
Glucose-Capillary: 127 mg/dL — ABNORMAL HIGH (ref 70–99)
Glucose-Capillary: 127 mg/dL — ABNORMAL HIGH (ref 70–99)
Glucose-Capillary: 128 mg/dL — ABNORMAL HIGH (ref 70–99)
Glucose-Capillary: 132 mg/dL — ABNORMAL HIGH (ref 70–99)
Glucose-Capillary: 134 mg/dL — ABNORMAL HIGH (ref 70–99)
Glucose-Capillary: 136 mg/dL — ABNORMAL HIGH (ref 70–99)
Glucose-Capillary: 138 mg/dL — ABNORMAL HIGH (ref 70–99)
Glucose-Capillary: 140 mg/dL — ABNORMAL HIGH (ref 70–99)
Glucose-Capillary: 143 mg/dL — ABNORMAL HIGH (ref 70–99)
Glucose-Capillary: 143 mg/dL — ABNORMAL HIGH (ref 70–99)
Glucose-Capillary: 146 mg/dL — ABNORMAL HIGH (ref 70–99)
Glucose-Capillary: 172 mg/dL — ABNORMAL HIGH (ref 70–99)
Glucose-Capillary: 181 mg/dL — ABNORMAL HIGH (ref 70–99)

## 2020-04-28 LAB — CBC
HCT: 31.4 % — ABNORMAL LOW (ref 39.0–52.0)
Hemoglobin: 9.1 g/dL — ABNORMAL LOW (ref 13.0–17.0)
MCH: 27.9 pg (ref 26.0–34.0)
MCHC: 29 g/dL — ABNORMAL LOW (ref 30.0–36.0)
MCV: 96.3 fL (ref 80.0–100.0)
Platelets: 57 10*3/uL — ABNORMAL LOW (ref 150–400)
RBC: 3.26 MIL/uL — ABNORMAL LOW (ref 4.22–5.81)
RDW: 18.2 % — ABNORMAL HIGH (ref 11.5–15.5)
WBC: 16.7 10*3/uL — ABNORMAL HIGH (ref 4.0–10.5)
nRBC: 0.5 % — ABNORMAL HIGH (ref 0.0–0.2)

## 2020-04-28 LAB — RENAL FUNCTION PANEL
Albumin: 1.6 g/dL — ABNORMAL LOW (ref 3.5–5.0)
Anion gap: 10 (ref 5–15)
BUN: 184 mg/dL — ABNORMAL HIGH (ref 8–23)
CO2: 27 mmol/L (ref 22–32)
Calcium: 7.1 mg/dL — ABNORMAL LOW (ref 8.9–10.3)
Chloride: 105 mmol/L (ref 98–111)
Creatinine, Ser: 2.19 mg/dL — ABNORMAL HIGH (ref 0.61–1.24)
GFR calc Af Amer: 34 mL/min — ABNORMAL LOW (ref >60)
GFR calc non Af Amer: 30 mL/min — ABNORMAL LOW (ref >60)
Glucose, Bld: 122 mg/dL — ABNORMAL HIGH (ref 70–99)
Phosphorus: 4.4 mg/dL (ref 2.5–4.6)
Potassium: 4.4 mmol/L (ref 3.5–5.1)
Sodium: 142 mmol/L (ref 135–145)

## 2020-04-28 LAB — MAGNESIUM: Magnesium: 3.8 mg/dL — ABNORMAL HIGH (ref 1.7–2.4)

## 2020-04-28 MED ORDER — VITAL 1.5 CAL PO LIQD
1000.0000 mL | ORAL | Status: DC
  Administered 2020-04-28 – 2020-05-01 (×4): 1000 mL
  Filled 2020-04-28 (×5): qty 1000

## 2020-04-28 MED ORDER — INSULIN ASPART 100 UNIT/ML ~~LOC~~ SOLN
3.0000 [IU] | SUBCUTANEOUS | Status: DC
  Administered 2020-04-28 (×2): 6 [IU] via SUBCUTANEOUS
  Administered 2020-04-28: 3 [IU] via SUBCUTANEOUS
  Administered 2020-04-29 (×3): 6 [IU] via SUBCUTANEOUS

## 2020-04-28 MED ORDER — METOCLOPRAMIDE HCL 5 MG/5ML PO SOLN
10.0000 mg | Freq: Every day | ORAL | Status: DC
  Filled 2020-04-28: qty 10

## 2020-04-28 MED ORDER — INSULIN DETEMIR 100 UNIT/ML ~~LOC~~ SOLN
18.0000 [IU] | Freq: Two times a day (BID) | SUBCUTANEOUS | Status: DC
  Administered 2020-04-28 – 2020-04-29 (×3): 18 [IU] via SUBCUTANEOUS
  Filled 2020-04-28 (×4): qty 0.18

## 2020-04-28 NOTE — Progress Notes (Signed)
Patient's head and arms repositioned at this time without complications.

## 2020-04-28 NOTE — Progress Notes (Signed)
Patient proned at this time. ETT secured in proper position with cloth tape.

## 2020-04-28 NOTE — Progress Notes (Signed)
[facility] Progress Note    Assessment/ Plan:    63M severe MSOF, ARDS. AKI from [place] in ICU, proned, on pressors, septic shock.  1.  AKI: secondary to distributive shock (septic).  Cr 1.01-->2.3 with some hyperkalemia.  Azotemic and this was previously felt likely d/t steroids/ free water deficit. K normalized, off lokelma.   - RRT has been held thus far.  Discussed with pulm 9/13.  She states family has expressed that for instance he wouldn't want a trach and they didn't feel that HD was something that he would've wanted on her prior discussions with family.  Defer RRT at this time  - Cr a little better and still only in the 2's - hopefully making headway with BUN soon - increase D5 to 125/hr and hope to transition to 1/2 NS soon  2. Covid PNA - therapies per pulm/critical care     3.  Acute hypoxic RF 2/2 COVID pneumonia: got remdesivir, baricitinib, steroid taper.  abx per primary team.  Vented per PCCM, proning protocol in place  4.  Hyperkalemia: s/p ca gluconate, bicarb, insulin/ dextrose, lokelma. Off Lokelma   5.  Shock: likely septic/ distributive, TTE without evidence of PE.  Cultures/ pressors/ antibiotics per PCCM. Lower extremity dopplers no DVT.  Respiratory culture growing E coli.  6. Hypernatremia  - improving - On D5W at 75 ml/hr; free water enterically as tolerated; improving.  hyperglycemia per primary team   7. Anemia normocytic - no acute indication for PRBC's  Dispo: ICU. Appreciate pulm and palliative care   Subjective:    Pt had 790 mL uop over 9/13 charted.  Has been on levo at 28 mcg/min and vasopressin.   Review of systems:  Unable to be obtained secondary to obtunded, intubated    Objective:   BP (!) 142/48   Pulse 72   Temp (!) 96.5 F (35.8 C) (Rectal)   Resp (!) 35   Ht _0  (1.88 m)   Wt 91.3 kg   SpO2 98%   BMI 25.84 kg/m   Intake/Output Summary (Last 24 hours) at 04/28/2020 0723 Last data filed at 04/28/2020  0700 Gross per 24 hour  Intake 3622.21 ml  Output 790 ml  Net 2832.21 ml   Weight change:   Physical Exam GEN intubated, sedated HEENT eyes closed NECK no JVD PULM proned. No crackles bilateral bases and lungs with reduced breath sounds CV S1S2 no rub ABD soft, nontender EXT trace LE edema NEURO intubated, sedated  Imaging: DG Abd 1 View  Result Date: 04/27/2020 CLINICAL DATA:  Vomiting EXAM: ABDOMEN - 1 VIEW COMPARISON:  Chest x-ray 04/25/2020 FINDINGS: Esophageal tube tip overlies the gastric outlet. Radiopaque material within the colon. Nonspecific diffuse decreased bowel gas. No radiopaque calculi. IMPRESSION: Esophageal tube tip overlies the gastric outlet. Nonspecific diffuse decreased bowel gas. Electronically Signed   By: Donavan Foil M.D.   On: 04/27/2020 19:07    Labs: BMET Recent Labs  Lab 04/23/20 2103 04/23/20 2354 04/24/20 0500 04/24/20 0500 04/24/20 1311 04/24/20 1355 04/24/20 1646 04/24/20 1815 04/25/20 0500 04/25/20 0500 04/25/20 0720 04/25/20 1429 04/26/20 0427 04/26/20 1657 04/27/20 0210 04/27/20 0321 04/28/20 0025 04/28/20 0250 04/28/20 0335  NA  --    < > 142   < > 146*   < > 146*   < > 148*   < > 150*   < > 147* 149* 147* 146* 147* 143 142  K  --    < > 6.0*   < >  5.7*   < > 6.0*   < > 4.6   < > 4.5   < > 4.4 4.9 4.4 4.5 3.4* 4.1 4.4  CL  --   --  108   < > 106  --  108  --  109  --  110  --  109  --   --  110  --   --  105  CO2  --   --  19*   < > 24  --  21*  --  27  --  28  --  27  --   --  29  --   --  27  GLUCOSE  --   --  225*   < > 246*  --  234*  --  146*  --  168*  --  333*  --   --  267*  --   --  122*  BUN  --   --  138*   < > 144*  --  146*  --  155*  --  155*  --  158*  --   --  170*  --   --  184*  CREATININE  --   --  2.15*   < > 2.31*  --  2.20*  --  2.13*  --  2.12*  --  1.99*  --   --  2.31*  --   --  2.19*  CALCIUM  --   --  7.6*   < > 8.0*  --  8.1*  --  8.1*  --  8.0*  --  7.5*  --   --  7.1*  --   --  7.1*  PHOS 8.7*   --  11.6*  --   --   --  10.3*  --  6.9*  --   --   --  5.8*  --   --   --   --   --  4.4   < > = values in this interval not displayed.   CBC Recent Labs  Lab 04/25/20 0500 04/25/20 1429 04/26/20 0427 04/26/20 1657 04/27/20 0321 04/28/20 0025 04/28/20 0250 04/28/20 0335  WBC 25.1*  --  17.4*  --  15.8*  --   --  16.7*  HGB 11.0*   < > 9.9*   < > 8.9* 7.1* 9.5* 9.1*  HCT 36.5*   < > 33.4*   < > 31.6* 21.0* 28.0* 31.4*  MCV 92.2  --  95.7  --  97.2  --   --  96.3  PLT 173  --  101*  --  79*  --   --  57*   < > = values in this interval not displayed.    Medications:    . vitamin C  500 mg Per Tube Daily  . aspirin  81 mg Per Tube Daily  . chlorhexidine gluconate (MEDLINE KIT)  15 mL Mouth Rinse BID  . Chlorhexidine Gluconate Cloth  6 each Topical Daily  . docusate  100 mg Per Tube BID  . feeding supplement (PROSource TF)  45 mL Per Tube QID  . free water  200 mL Per Tube Q2H  . heparin injection (subcutaneous)  5,000 Units Subcutaneous Q8H  . hydrocortisone sod succinate (SOLU-CORTEF) inj  50 mg Intravenous Q6H  . mouth rinse  15 mL Mouth Rinse 10 times per day  . pantoprazole (PROTONIX) IV  40 mg Intravenous Daily  . polyethylene glycol  17 g  Per Tube Daily  . sevelamer carbonate  0.8 g Per Tube TID WC  . sodium chloride flush  10-40 mL Intracatheter Q12H     Claudia Desanctis MD 04/28/2020, 8:06 AM

## 2020-04-28 NOTE — Progress Notes (Addendum)
NAME:  MILDRED BOLLARD, MRN:  [id number], DOB:  1951-07-01, LOS: 79 ADMISSION DATE:  04/12/2020, CONSULTATION DATE: 04/23/2020 REFERRING MD: Elgergawy-TRH, CHIEF COMPLAINT: Hypoxia  HPI/course in hospital  69 year old man with worsening hypoxia related to COVID-19 pneumonia. 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  Admitted to [facility] 8/23.  Transfer to [facility] 8/31 required BiPAP.  Transferred back to hospitalist service 9/3.  On 9/8 overnight, placed on BiPAP for worsening hypoxia, failed requiring intubation 9/9.   Past Medical History   Past Medical History:  Diagnosis Date  . Allergy   . Cirrhosis ([place])   . Coronary artery disease   . Diabetes mellitus without complication ([place])   . History of kidney stones       Significant diagnostic tests:  Echocardiogram 9/10: LVEF 60 to 65%, no regional wall motion abnormalities.  Normal RV.  CXR 9/11-bilateral lower lobe infiltrates  9/10 BLE venous duplex >> negative for DVT  Procedures:  9/6 ETT >> 9/10 R IJ >> 9/10 right radial Aline >> 9/10 cortrak >> Foley    Micro:  8/23 Covid + 8/24 MRSA neg 8/23 BCx2 >> neg 9/10 trach asp >> E. Coli 9/10 BCx2 >> ngtd  Antimicrobials:  Cefepime 9/10 >> 9/12 Ceftriaxone 9/13 >  Baricitinib  8/24 >> 9/6 remdesivir 8/23 >>8/27 Solumedrol 8/23 >>9/11   Interim history/subjective:  Proned at 0130, planning for flip to supine at 1730. SCr minimally improved but BUN continues to climb.  Objective   Blood pressure (!) 142/48, pulse 73, temperature (!) 96.5 F (35.8 C), temperature source Axillary, resp. rate (!) 31, height 6\' 2"  (1.88 m), weight 91.3 kg, SpO2 98 %.    Vent Mode: PRVC FiO2 (%):  [70 %-90 %] 70 % Set Rate:  [35 bmp] 35 bmp Vt Set:  [490 mL] 490 mL PEEP:  [12 cmH20-15 cmH20] 12 cmH20 Plateau Pressure:  [26 cmH20-34 cmH20] 26 cmH20  At 6  cc/kg/IBW  Intake/Output Summary (Last 24 hours) at 04/28/2020 0902 Last data filed at 04/28/2020 0800 Gross per 24 hour  Intake 3334.14 ml  Output 765 ml  Net 2569.14 ml   Filed Weights   04/24/20 0500 04/25/20 0500 04/27/20 0500  Weight: 89.7 kg 91.3 kg 91.3 kg    Physical Exam General:  Critically ill elderly male intubated, sedated and proned HEENT: [place] / AT.  Cortrak and ETT in place Neuro: RASS -5, sedated CV: NSR PULM:  MV supported breaths, synchronous, CTAB GI: +BM, foley  Extremities: warm/dry, +1 generalized edema  Skin: no rashes, pale   Assessment & Plan:   Acute hypoxic respiratory failure due to COVID-19 PNA E. Coli pneumonia -likely fibrotic component given late stage from initial presentation  Continue mechanical ventilation, LTVV, target TVol 6-8cc/kgIBW Target Plateau Pressure < 30cm H20; driving pressure less than 15 cm of water  Target PaO2 55-65: titrate PEEP/ FiO2 per protocol Continue proning for PF ratio is less than 1:150 position for 16 hours a day Ventilator associated pneumonia prevention protocol Trend CXR/ ABG PAD protocol with fentanyl/ versed gtt w/ bowel regimen and PRN NMB Based on PMT discussions with family 9/12, patient would not want long term support/ trach; remains on significant vent support   Septic shock- E. Coli pneumonia Continue ceftriaxone Continue NE and vasopressin for MAP goal > 65 Continue stress dose steroids Follow BC/ trend WBC/ fever curve  AKI- in  the setting of septic shock Appreciate Nephrology's input. No emergent indications for iHD and would be poor candidate.  Additionally, family not sure pt would even want HD in the first place Continue foley/ strict I/Os, daily weights Trend daily renal indices  Dm2 with hyperglycemia - anticipate getting slightly worse with increased D5 rate Transition off insulin gtt  Cryptogenic cirrhosis-no signs of decompensated liver disease or hepatic encephalopathy Trend  LFTs Holding statin currently  Coronary artery disease status post bypass HTN HLD Holding antihypertensives in setting of shock ASA Holding statin as above  Acute normocytic anemia, likely due to critical illness Acute thrombocytopenia, likely due to critical illness, no signs of bleeding Transfuse for hemoglobin less than 7 or hemodynamically significant bleeding Continue to monitor  Protein calorie malnutrition  TF when able (holding given his emesis and constipation)  Emesis x 2 with constipation - KUB 9/13 negative.  Had 2 BM's overnight. Continue NPO for now Can consider resuming TF's in AM   Daily Goals Checklist  Pain/Anxiety/Delirium protocol (if indicated): fent, versed VAP protocol (if indicated): VAP DVT prophylaxis: Hold lovenox for today given platelets 57, re-visit in AM 9/15 Nutritional status and feeding goals: EN - currently on hold GI prophylaxis: Pantoprazole. Glucose control: SSI Mobility/therapy needs: BR  Code Status: DNR as of 9/12 Family Communication:  Pending, will call this PM Disposition: ICU   CC time: 40 min.   Montey Hora, [place] [facility] 04/28/2020, 9:22 AM

## 2020-04-28 NOTE — Progress Notes (Signed)
RTx2 and RNx4 turned patient supine @1715  without complication. RT will continue to monitor.

## 2020-04-29 ENCOUNTER — Inpatient Hospital Stay (HOSPITAL_COMMUNITY): Payer: Medicare HMO

## 2020-04-29 LAB — POCT I-STAT 7, (LYTES, BLD GAS, ICA,H+H)
Acid-base deficit: 2 mmol/L (ref 0.0–2.0)
Acid-base deficit: 1 mmol/L (ref 0.0–2.0)
Bicarbonate: 26.4 mmol/L (ref 20.0–28.0)
Bicarbonate: 27.7 mmol/L (ref 20.0–28.0)
Calcium, Ion: 1.02 mmol/L — ABNORMAL LOW (ref 1.15–1.40)
Calcium, Ion: 1.01 mmol/L — ABNORMAL LOW (ref 1.15–1.40)
HCT: 24 % — ABNORMAL LOW (ref 39.0–52.0)
HCT: 26 % — ABNORMAL LOW (ref 39.0–52.0)
Hemoglobin: 8.2 g/dL — ABNORMAL LOW (ref 13.0–17.0)
Hemoglobin: 8.8 g/dL — ABNORMAL LOW (ref 13.0–17.0)
O2 Saturation: 89 %
O2 Saturation: 88 %
Patient temperature: 98.2
Patient temperature: 98.8
Potassium: 4.5 mmol/L (ref 3.5–5.1)
Potassium: 5 mmol/L (ref 3.5–5.1)
Sodium: 140 mmol/L (ref 135–145)
Sodium: 140 mmol/L (ref 135–145)
TCO2: 28 mmol/L (ref 22–32)
TCO2: 30 mmol/L (ref 22–32)
pCO2 arterial: 62.5 mmHg — ABNORMAL HIGH (ref 32.0–48.0)
pCO2 arterial: 67.6 mmHg (ref 32.0–48.0)
pH, Arterial: 7.232 — ABNORMAL LOW (ref 7.350–7.450)
pH, Arterial: 7.222 — ABNORMAL LOW (ref 7.350–7.450)
pO2, Arterial: 68 mmHg — ABNORMAL LOW (ref 83.0–108.0)
pO2, Arterial: 68 mmHg — ABNORMAL LOW (ref 83.0–108.0)

## 2020-04-29 LAB — CULTURE, BLOOD (ROUTINE X 2)
Culture: NO GROWTH
Culture: NO GROWTH

## 2020-04-29 LAB — BASIC METABOLIC PANEL
Anion gap: 10 (ref 5–15)
BUN: 196 mg/dL — ABNORMAL HIGH (ref 8–23)
CO2: 24 mmol/L (ref 22–32)
Calcium: 6.9 mg/dL — ABNORMAL LOW (ref 8.9–10.3)
Chloride: 105 mmol/L (ref 98–111)
Creatinine, Ser: 2.3 mg/dL — ABNORMAL HIGH (ref 0.61–1.24)
GFR calc Af Amer: 32 mL/min — ABNORMAL LOW (ref >60)
GFR calc non Af Amer: 28 mL/min — ABNORMAL LOW (ref >60)
Glucose, Bld: 206 mg/dL — ABNORMAL HIGH (ref 70–99)
Potassium: 4.6 mmol/L (ref 3.5–5.1)
Sodium: 139 mmol/L (ref 135–145)

## 2020-04-29 LAB — PHOSPHORUS: Phosphorus: 4.5 mg/dL (ref 2.5–4.6)

## 2020-04-29 LAB — CBC
HCT: 28.2 % — ABNORMAL LOW (ref 39.0–52.0)
Hemoglobin: 8.2 g/dL — ABNORMAL LOW (ref 13.0–17.0)
MCH: 27.7 pg (ref 26.0–34.0)
MCHC: 29.1 g/dL — ABNORMAL LOW (ref 30.0–36.0)
MCV: 95.3 fL (ref 80.0–100.0)
Platelets: 45 10*3/uL — ABNORMAL LOW (ref 150–400)
RBC: 2.96 MIL/uL — ABNORMAL LOW (ref 4.22–5.81)
RDW: 18.4 % — ABNORMAL HIGH (ref 11.5–15.5)
WBC: 15 10*3/uL — ABNORMAL HIGH (ref 4.0–10.5)
nRBC: 1 % — ABNORMAL HIGH (ref 0.0–0.2)

## 2020-04-29 LAB — GLUCOSE, CAPILLARY
Glucose-Capillary: 197 mg/dL — ABNORMAL HIGH (ref 70–99)
Glucose-Capillary: 180 mg/dL — ABNORMAL HIGH (ref 70–99)
Glucose-Capillary: 173 mg/dL — ABNORMAL HIGH (ref 70–99)
Glucose-Capillary: 165 mg/dL — ABNORMAL HIGH (ref 70–99)
Glucose-Capillary: 183 mg/dL — ABNORMAL HIGH (ref 70–99)
Glucose-Capillary: 179 mg/dL — ABNORMAL HIGH (ref 70–99)

## 2020-04-29 LAB — APTT
aPTT: 75 seconds — ABNORMAL HIGH (ref 24–36)
aPTT: 75 seconds — ABNORMAL HIGH (ref 24–36)

## 2020-04-29 LAB — MAGNESIUM: Magnesium: 3.7 mg/dL — ABNORMAL HIGH (ref 1.7–2.4)

## 2020-04-29 MED ORDER — INSULIN ASPART 100 UNIT/ML ~~LOC~~ SOLN
0.0000 [IU] | SUBCUTANEOUS | Status: DC
  Administered 2020-04-29 – 2020-04-30 (×4): 4 [IU] via SUBCUTANEOUS
  Administered 2020-04-30 (×2): 7 [IU] via SUBCUTANEOUS
  Administered 2020-04-30: 4 [IU] via SUBCUTANEOUS
  Administered 2020-04-30 (×2): 7 [IU] via SUBCUTANEOUS
  Administered 2020-05-01: 4 [IU] via SUBCUTANEOUS
  Administered 2020-05-01 (×2): 7 [IU] via SUBCUTANEOUS

## 2020-04-29 MED ORDER — OXYCODONE HCL 5 MG PO TABS
5.0000 mg | ORAL_TABLET | Freq: Four times a day (QID) | ORAL | Status: DC
  Administered 2020-04-29 – 2020-05-01 (×9): 5 mg
  Filled 2020-04-29 (×9): qty 1

## 2020-04-29 MED ORDER — ATORVASTATIN CALCIUM 40 MG PO TABS
40.0000 mg | ORAL_TABLET | Freq: Every day | ORAL | Status: DC
  Administered 2020-04-29 – 2020-05-01 (×3): 40 mg
  Filled 2020-04-29 (×3): qty 1

## 2020-04-29 MED ORDER — CLONAZEPAM 1 MG PO TABS
1.0000 mg | ORAL_TABLET | Freq: Two times a day (BID) | ORAL | Status: DC
  Administered 2020-04-29 – 2020-05-01 (×5): 1 mg
  Filled 2020-04-29 (×5): qty 1

## 2020-04-29 MED ORDER — INSULIN DETEMIR 100 UNIT/ML ~~LOC~~ SOLN
22.0000 [IU] | Freq: Two times a day (BID) | SUBCUTANEOUS | Status: DC
  Administered 2020-04-29 – 2020-04-30 (×2): 22 [IU] via SUBCUTANEOUS
  Filled 2020-04-29 (×3): qty 0.22

## 2020-04-29 MED ORDER — ARGATROBAN 50 MG/50ML IV SOLN
0.5000 ug/kg/min | INTRAVENOUS | Status: DC
  Administered 2020-04-29 – 2020-05-01 (×4): 0.5 ug/kg/min via INTRAVENOUS
  Filled 2020-04-29 (×4): qty 50

## 2020-04-29 NOTE — Progress Notes (Signed)
Pt returned to the supine position with RT and RN x5. Pt tolerated well. RT will continue to monitor.

## 2020-04-29 NOTE — Progress Notes (Signed)
Brief Nutrition note:   Noted palliative care following; noted plan of care is transition to comfort care if no evidence of improvement in a few days. Pt is a DNR.   Trickle TF restarted today. Pt had been vomiting with gastric feedings via gastric Cortrak.   Post-pyloric Cortrak requested; team has been unable to place due to pt in prone position during Cortrak hours.  Plan to follow poc and TF tolerance; if tolerates trickle TF via gastric Cortrak, recommend attempting to titrate to goal. Plan to attempt to advance Cortrak tube when able  Kerman Passey MS, RDN, LDN, CNSC Registered Dietitian III Clinical Nutrition RD Pager and On-Call Pager Number Located in [place]

## 2020-04-29 NOTE — Progress Notes (Signed)
Patient's head and arms turned at this time without complications.

## 2020-04-29 NOTE — Consult Note (Signed)
WOC consulted for MARSI to the lip related to taping of ETT and proning. He is also noted to have a pressure injury documented on his ear and some skin alteration on his thigh.  He is prone until sometime between 4-5 pm.  WOC will use elink technology to camera in once the patient is supine again in order to assess the above skin issues.   [place], [place], [place]

## 2020-04-29 NOTE — Progress Notes (Signed)
Palliative care following for needs and supporting family. We spoke with family on 9/13-plan for now is to give his condition a few more days to evolve and if he is not showing evidence of improvement or has continued decline they will consider transition to comfort care. Maintain all current interventions and escalate as indicated. In the event of cardiac arrest, provide comfort measures.  Lane Hacker, DO Palliative Medicine [phone]

## 2020-04-29 NOTE — Progress Notes (Addendum)
ANTICOAGULATION CONSULT NOTE  Pharmacy Consult:  Argatroban Indication:  Rule out HIT  Allergies  Allergen Reactions  . Benadryl [Diphenhydramine Hcl] Swelling  . Iodinated Diagnostic Agents Other (See Comments)    Unknown  . Morphine And Related Other (See Comments)    Unknown    Patient Measurements: Height: 6\' 2"  (188 cm) Weight: 101.6 kg (223 lb 15.8 oz) IBW/kg (Calculated) : 82.2  Vital Signs: Temp: 98.8 F (37.1 C) (09/15 1551) Temp Source: Axillary (09/15 1551) Pulse Rate: 93 (09/15 1845)  Labs: Recent Labs    04/27/20 0321 04/28/20 0025 04/28/20 0335 04/28/20 2019 04/29/20 0333 04/29/20 0333 04/29/20 0344 04/29/20 1830 04/29/20 1843  HGB 8.9*   < > 9.1*   < > 8.2*   < > 8.2*  --  8.8*  HCT 31.6*   < > 31.4*   < > 28.2*  --  24.0*  --  26.0*  PLT 79*  --  57*  --  45*  --   --   --   --   APTT  --   --   --   --   --   --   --  75*  --   CREATININE 2.31*  --  2.19*  --  2.30*  --   --   --   --    < > = values in this interval not displayed.    Estimated Creatinine Clearance: 38.6 mL/min (A) (by C-G formula based on SCr of 2.3 mg/dL (H)).   Medical History: Past Medical History:  Diagnosis Date  . Allergy   . Cirrhosis ([place])   . Coronary artery disease   . Diabetes mellitus without complication ([place])   . History of kidney stones     Assessment: 51 YOM with Covid-19 on heparin SQ for VTE prophylaxis.  Platelets trending down and now working up for HIT.  Pharmacy consulted to start argatroban.  Hemoglobin low and stable; platelet count low at 45K; no bleeding reported.  LFTs mildly elevated.  APTT - 75 seconds  Goal of Therapy:  aPTT 50-90 seconds Monitor platelets by anticoagulation protocol: Yes   Plan:  Continue Argatroban 0.5 mcg/kg/min Check 2 hr aPTT Daily aPTT and CBC Check LFTs in AM  Alanda Slim, PharmD, [facility] Clinical Pharmacist Please see AMION for all Pharmacists' Contact Phone Numbers 04/29/2020, 7:04 PM     ADDENDUM:  Repeat aPTT 75 seconds  Plan:  Continue Argatroban 0.5 mcg/kg/min Daily aPTT and CBC  Alanda Slim, PharmD, [facility] Clinical Pharmacist Please see AMION for all Pharmacists' Contact Phone Numbers 04/29/2020, 10:05 PM

## 2020-04-29 NOTE — Progress Notes (Signed)
Head & arms rotated. Patient now lying left side face down.

## 2020-04-29 NOTE — Progress Notes (Signed)
NAME:  Alexander Parks, MRN:  [id number], DOB:  01/19/1951, LOS: 23 ADMISSION DATE:  04/09/2020, CONSULTATION DATE: 04/23/2020 REFERRING MD: Elgergawy-TRH, CHIEF COMPLAINT: Hypoxia  HPI/course in hospital  69 year old man with worsening hypoxia related to COVID-19 pneumonia. 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  Admitted to [facility] 8/23.  [facility] to [facility] 8/31 required BiPAP.  Transferred back to hospitalist service 9/3.  On 9/8 overnight, placed on BiPAP for worsening hypoxia, failed requiring intubation 9/9.   Past Medical History   Past Medical History:  Diagnosis Date  . Allergy   . Cirrhosis ([place])   . Coronary artery disease   . Diabetes mellitus without complication ([place])   . History of kidney stones       Significant diagnostic tests:  Echocardiogram 9/10: LVEF 60 to 65%, no regional wall motion abnormalities.  Normal RV.  CXR 9/11-bilateral lower lobe infiltrates  9/10 BLE venous duplex >> negative for DVT  Procedures:  9/6 ETT >> 9/10 R IJ >> 9/10 right radial Aline >> 9/10 cortrak >> Foley    Micro:  8/23 Covid + 8/24 MRSA neg 8/23 BCx2 >> neg 9/10 trach asp >> E. Coli 9/10 BCx2 >> ngtd  Antimicrobials:  Cefepime 9/10 >> 9/12 Ceftriaxone 9/13 >  Baricitinib  8/24 >> 9/6 remdesivir 8/23 >>8/27 Solumedrol 8/23 >>9/11   Interim history/subjective:  Proned.  Some lip trauma.   Objective   Blood pressure (!) 129/52, pulse 88, temperature 98 F (36.7 C), temperature source Axillary, resp. rate (!) 0, height 6\' 2"  (1.88 m), weight 101.6 kg, SpO2 (!) 88 %.    Vent Mode: PRVC FiO2 (%):  [70 %-80 %] 80 % Set Rate:  [35 bmp] 35 bmp Vt Set:  [490 mL] 490 mL PEEP:  [10 cmH20-12 cmH20] 12 cmH20 Plateau Pressure:  [20 cmH20-26 cmH20] 26 cmH20     Intake/Output Summary (Last 24 hours) at 04/29/2020 1103 Last data filed at 04/29/2020  1000 Gross per 24 hour  Intake 3500.77 ml  Output 930 ml  Net 2570.77 ml   Filed Weights   04/25/20 0500 04/27/20 0500 04/29/20 0500  Weight: 91.3 kg 91.3 kg 101.6 kg    Physical Exam General: critically ill appearing man, intubated, sedated, proned HEENT: [place]/AT, scleral edema. Eyes anicteric Neuro: RASS -5, breathing above the vent. Pupils small, reactive to light. CV: RRR, sinus rhythm on tele PULM:  New rub in left base, rhales bilaterally. No significant tracheal secretions. Mild dyssynchrony, peak airway pressures ~30. Pplat 26, driving pressure 14. GI: abd, soft, ND Extremities: pitting LE edema, no cyanosis or clubbing Skin: lip wounds from ETT     Assessment & Plan:   Acute hypoxic respiratory failure due to COVID-19 PNA ARDS (P:F 68 today) E. Coli pneumonia -likely fibrotic component given late stage from initial presentation  -Con't LTVV, 4-8cc/kg IBW; goal plateau <30 and driving pressure <35-32. Meeting pressure goals today. -PEEP & FiO2 titration per ARDSNet ladder.  -prone 16 h/day  for P:F<150 -VAP prevention protocol -PAD protocol with fentanyl/ versed gtt w/ bowel regimen and PRN NMB. Adding enteral sedation to reduce IV requirements. -Based on discussions with family 9/12, patient would not want long term support/ trach; remains on significant vent support   Septic shock- E. Coli pneumonia -Continue ceftriaxone to complete 7 days -Continue NE and vasopressin for MAP goal > 65 -Continue stress dose steroids; can  wean when pressor requirements start to improve -follow blood cultures until finalized  AKI- persistent, recurrent this admission -Appreciate Nephrology's input. No emergent indications for RRT.  Recurrent AKI in the ICU portends a poor prognosis for renal recovery, which then makes RRT seem inconsistent with his previously stated wishes via his wife and daughter. -Continue foley/ strict I/Os, daily weights -con't to monitor -strict I/Os  Dm2  with hyperglycemia - anticipate getting slightly worse with increased D5 rate -basal bolus insulin; increase lantus to 22 units BID -SSI PRN-  increase to severe insulin resistance scale   Cryptogenic cirrhosis-no signs of decompensated liver disease or hepatic encephalopathy Elevated transaminases due to sepsis- downtrending -periodic monitoring of LFTs -ok to resume statin  Coronary artery disease status post bypass HTN HLD -Holding antihypertensives in setting of shock -ASA -ok to resume statin; resuming at half dose   Acute normocytic anemia, likely due to critical illness Acute thrombocytopenia, likely due to critical illness, no signs of bleeding -switch heparin to argatroban -HIT ab  -con't to monitor; transfuse for platelets <10 or Hb <7 or hemodynamically significant bleeding  Protein calorie malnutrition  -trickle TF given previous episodes of vomiting.  Emesis x 2 with constipation - KUB 9/13 negative.  Had 2 BM's overnight. -bowel regimen   Daily Goals Checklist  Pain/Anxiety/Delirium protocol (if indicated): fent, versed VAP protocol (if indicated): VAP DVT prophylaxis: Hold lovenox for today given platelets 57, re-visit in AM 9/15 Nutritional status and feeding goals: EN - currently on hold GI prophylaxis: Pantoprazole. Glucose control: SSI Mobility/therapy needs: BR  Code Status: DNR as of 9/12 Family Communication:  Wife, daughter Disposition: ICU   This patient is critically ill with multiple organ system failure which requires frequent high complexity decision making, assessment, support, evaluation, and titration of therapies. This was completed through the application of advanced monitoring technologies and extensive interpretation of multiple databases. During this encounter critical care time was devoted to patient care services described in this note for 39 minutes.    Julian Hy, DO 04/29/20 11:40 AM  Pulmonary & Critical Care

## 2020-04-29 NOTE — Progress Notes (Signed)
Critical post supine abg results given to Dr. Carlis Abbott:  PH 7.22  PCO2 67.6  PAO2 68  HCO3 27.7 SAO2 88   Verbal order received to reprone pt when appropriate time. RT will continue to monitor.

## 2020-04-29 NOTE — Progress Notes (Signed)
[facility] Progress Note    Assessment/ Plan:    26M severe MSOF, ARDS. AKI from [place] in ICU, proned, on pressors, septic shock.  1.  AKI: secondary to distributive shock (septic).  Cr 1.01-->2.3 with some hyperkalemia.  Azotemic and this was previously felt likely d/t steroids/ free water deficit. K normalized, off lokelma.   - RRT has been held thus far.  Discussed with pulm and she states family has expressed that for instance he wouldn't want a trach and they didn't feel that HD was something that he would've wanted on her prior discussions with family.  Defer RRT at this time  - Cr a little better and still only in the 2's - hopefully making headway with BUN soon - transition to normal saline at 75 ml/hr  2. Covid PNA - therapies per pulm/critical care     3.  Acute hypoxic RF 2/2 COVID pneumonia: got remdesivir, baricitinib, steroid taper.  abx per primary team.  Vented per PCCM, proning protocol in place  4.  Hyperkalemia: s/p ca gluconate, bicarb, insulin/ dextrose, lokelma. Off Lokelma   5.  Shock: likely septic/ distributive, TTE without evidence of PE.  Cultures/ pressors/ antibiotics per PCCM. Lower extremity dopplers no DVT.  Respiratory culture growing E coli.  6. Hypernatremia  - resolved; transition to normal saline at 75 ml/hr  7. Anemia normocytic - no acute indication for PRBC's  Dispo: ICU. Appreciate pulm and palliative care   Subjective:    Pt had 1 liter uop over 9/14 charted.  Has been on levo at 20 mcg/min and vasopressin.  His fluids were transitioned to D5 at 50 ml/hr yesterday per primary and has remained on that  Review of systems:   Unable to be obtained secondary to obtunded, intubated    Objective:   BP (!) 129/52   Pulse 88   Temp 98.9 F (37.2 C) (Axillary)   Resp (!) 5   Ht 6' 2"  (1.88 m)   Wt 101.6 kg   SpO2 (!) 88%   BMI 28.76 kg/m   Intake/Output Summary (Last 24 hours) at 04/29/2020 0752 Last data filed at  04/29/2020 0700 Gross per 24 hour  Intake 3795.38 ml  Output 980 ml  Net 2815.38 ml   Weight change:   Physical Exam GEN intubated, sedated  HEENT eyes closed NECK no JVD PULM proned. No crackles bilateral bases and lungs with reduced breath sounds; PEEP 12 and FIO2 80 CV S1S2 no rub ABD soft, nontender EXT 1+LE edema NEURO intubated, sedated  Imaging: DG Abd 1 View  Result Date: 04/27/2020 CLINICAL DATA:  Vomiting EXAM: ABDOMEN - 1 VIEW COMPARISON:  Chest x-ray 04/25/2020 FINDINGS: Esophageal tube tip overlies the gastric outlet. Radiopaque material within the colon. Nonspecific diffuse decreased bowel gas. No radiopaque calculi. IMPRESSION: Esophageal tube tip overlies the gastric outlet. Nonspecific diffuse decreased bowel gas. Electronically Signed   By: Donavan Foil M.D.   On: 04/27/2020 19:07    Labs: BMET Recent Labs  Lab 04/23/20 2103 04/23/20 2354 04/24/20 0500 04/24/20 1311 04/24/20 1646 04/24/20 1815 04/25/20 0500 04/25/20 0500 04/25/20 0720 04/25/20 1429 04/26/20 0427 04/26/20 1657 04/27/20 0321 04/28/20 0025 04/28/20 0250 04/28/20 0335 04/28/20 2019 04/29/20 0333 04/29/20 0344  NA  --    < > 142   < > 146*   < > 148*   < > 150*   < > 147*   < > 146* 147* 143 142 142 139 140  K  --    < >  6.0*   < > 6.0*   < > 4.6   < > 4.5   < > 4.4   < > 4.5 3.4* 4.1 4.4 4.1 4.6 4.5  CL  --   --  108   < > 108  --  109  --  110  --  109  --  110  --   --  105  --  105  --   CO2  --   --  19*   < > 21*  --  27  --  28  --  27  --  29  --   --  27  --  24  --   GLUCOSE  --   --  225*   < > 234*  --  146*  --  168*  --  333*  --  267*  --   --  122*  --  206*  --   BUN  --   --  138*   < > 146*  --  155*  --  155*  --  158*  --  170*  --   --  184*  --  196*  --   CREATININE  --   --  2.15*   < > 2.20*  --  2.13*  --  2.12*  --  1.99*  --  2.31*  --   --  2.19*  --  2.30*  --   CALCIUM  --   --  7.6*   < > 8.1*  --  8.1*  --  8.0*  --  7.5*  --  7.1*  --   --  7.1*  --   6.9*  --   PHOS 8.7*  --  11.6*  --  10.3*  --  6.9*  --   --   --  5.8*  --   --   --   --  4.4  --  4.5  --    < > = values in this interval not displayed.   CBC Recent Labs  Lab 04/26/20 0427 04/26/20 1657 04/27/20 0321 04/28/20 0025 04/28/20 0335 04/28/20 2019 04/29/20 0333 04/29/20 0344  WBC 17.4*  --  15.8*  --  16.7*  --  15.0*  --   HGB 9.9*   < > 8.9*   < > 9.1* 7.5* 8.2* 8.2*  HCT 33.4*   < > 31.6*   < > 31.4* 22.0* 28.2* 24.0*  MCV 95.7  --  97.2  --  96.3  --  95.3  --   PLT 101*  --  79*  --  57*  --  45*  --    < > = values in this interval not displayed.    Medications:    . vitamin C  500 mg Per Tube Daily  . aspirin  81 mg Per Tube Daily  . chlorhexidine gluconate (MEDLINE KIT)  15 mL Mouth Rinse BID  . Chlorhexidine Gluconate Cloth  6 each Topical Daily  . docusate  100 mg Per Tube BID  . feeding supplement (PROSource TF)  45 mL Per Tube QID  . feeding supplement (VITAL 1.5 CAL)  1,000 mL Per Tube Q24H  . heparin injection (subcutaneous)  5,000 Units Subcutaneous Q8H  . hydrocortisone sod succinate (SOLU-CORTEF) inj  50 mg Intravenous Q6H  . insulin aspart  3-9 Units Subcutaneous Q4H  . insulin detemir  18 Units Subcutaneous Q12H  . mouth rinse  15  mL Mouth Rinse 10 times per day  . pantoprazole (PROTONIX) IV  40 mg Intravenous Daily  . polyethylene glycol  17 g Per Tube Daily  . sevelamer carbonate  0.8 g Per Tube TID WC  . sodium chloride flush  10-40 mL Intracatheter Q12H     Claudia Desanctis MD 04/29/2020, 8:14 AM

## 2020-04-29 NOTE — Progress Notes (Signed)
Patient placed in prone position at this time. ETT secured with cloth tape in the proper position. No complications at this time.

## 2020-04-29 NOTE — Progress Notes (Addendum)
ANTICOAGULATION CONSULT NOTE - Initial Consult  Pharmacy Consult:  Argatroban Indication:  Rule out HIT  Allergies  Allergen Reactions  . Benadryl [Diphenhydramine Hcl] Swelling  . Iodinated Diagnostic Agents Other (See Comments)    Unknown  . Morphine And Related Other (See Comments)    Unknown    Patient Measurements: Height: 6\' 2"  (188 cm) Weight: 101.6 kg (223 lb 15.8 oz) IBW/kg (Calculated) : 82.2  Vital Signs: Temp: 98.5 F (36.9 C) (09/15 1121) Temp Source: Axillary (09/15 1121) BP: 129/52 (09/15 0121) Pulse Rate: 88 (09/15 1200)  Labs: Recent Labs    04/27/20 0321 04/28/20 0025 04/28/20 0335 04/28/20 0335 04/28/20 2019 04/28/20 2019 04/29/20 0333 04/29/20 0344  HGB 8.9*   < > 9.1*   < > 7.5*   < > 8.2* 8.2*  HCT 31.6*   < > 31.4*   < > 22.0*  --  28.2* 24.0*  PLT 79*  --  57*  --   --   --  45*  --   CREATININE 2.31*  --  2.19*  --   --   --  2.30*  --    < > = values in this interval not displayed.    Estimated Creatinine Clearance: 38.6 mL/min (A) (by C-G formula based on SCr of 2.3 mg/dL (H)).   Medical History: Past Medical History:  Diagnosis Date  . Allergy   . Cirrhosis ([place])   . Coronary artery disease   . Diabetes mellitus without complication ([place])   . History of kidney stones     Assessment: 53 YOM with Covid-19 on heparin SQ for VTE prophylaxis.  Platelets trending down and now working up for HIT.  Pharmacy consulted to start argatroban.  Hemoglobin low and stable; platelet count low at 45K; no bleeding reported.  LFTs mildly elevated.  Goal of Therapy:  aPTT 50-90 seconds Monitor platelets by anticoagulation protocol: Yes   Plan:  Argatroban 0.5 mcg/kg/min Check 2 hr aPTT Daily aPTT and CBC Check LFTs in AM  Hoyte Ziebell D. Mina Marble, PharmD, BCPS, [place] 04/29/2020, 12:47 PM

## 2020-04-30 LAB — COMPREHENSIVE METABOLIC PANEL
ALT: 90 U/L — ABNORMAL HIGH (ref 0–44)
AST: 72 U/L — ABNORMAL HIGH (ref 15–41)
Albumin: 1.5 g/dL — ABNORMAL LOW (ref 3.5–5.0)
Alkaline Phosphatase: 126 U/L (ref 38–126)
Anion gap: 12 (ref 5–15)
BUN: 205 mg/dL — ABNORMAL HIGH (ref 8–23)
CO2: 23 mmol/L (ref 22–32)
Calcium: 7 mg/dL — ABNORMAL LOW (ref 8.9–10.3)
Chloride: 104 mmol/L (ref 98–111)
Creatinine, Ser: 2.71 mg/dL — ABNORMAL HIGH (ref 0.61–1.24)
GFR calc Af Amer: 27 mL/min — ABNORMAL LOW (ref >60)
GFR calc non Af Amer: 23 mL/min — ABNORMAL LOW (ref >60)
Glucose, Bld: 207 mg/dL — ABNORMAL HIGH (ref 70–99)
Potassium: 5 mmol/L (ref 3.5–5.1)
Sodium: 139 mmol/L (ref 135–145)
Total Bilirubin: 0.7 mg/dL (ref 0.3–1.2)
Total Protein: 5.4 g/dL — ABNORMAL LOW (ref 6.5–8.1)

## 2020-04-30 LAB — GLUCOSE, CAPILLARY
Glucose-Capillary: 190 mg/dL — ABNORMAL HIGH (ref 70–99)
Glucose-Capillary: 226 mg/dL — ABNORMAL HIGH (ref 70–99)
Glucose-Capillary: 232 mg/dL — ABNORMAL HIGH (ref 70–99)
Glucose-Capillary: 225 mg/dL — ABNORMAL HIGH (ref 70–99)
Glucose-Capillary: 201 mg/dL — ABNORMAL HIGH (ref 70–99)
Glucose-Capillary: 183 mg/dL — ABNORMAL HIGH (ref 70–99)

## 2020-04-30 LAB — CBC
HCT: 26.5 % — ABNORMAL LOW (ref 39.0–52.0)
Hemoglobin: 7.7 g/dL — ABNORMAL LOW (ref 13.0–17.0)
MCH: 27.6 pg (ref 26.0–34.0)
MCHC: 29.1 g/dL — ABNORMAL LOW (ref 30.0–36.0)
MCV: 95 fL (ref 80.0–100.0)
Platelets: 41 10*3/uL — ABNORMAL LOW (ref 150–400)
RBC: 2.79 MIL/uL — ABNORMAL LOW (ref 4.22–5.81)
RDW: 18.6 % — ABNORMAL HIGH (ref 11.5–15.5)
WBC: 18.2 10*3/uL — ABNORMAL HIGH (ref 4.0–10.5)
nRBC: 1.5 % — ABNORMAL HIGH (ref 0.0–0.2)

## 2020-04-30 LAB — POCT I-STAT 7, (LYTES, BLD GAS, ICA,H+H)
Acid-base deficit: 1 mmol/L (ref 0.0–2.0)
Bicarbonate: 27 mmol/L (ref 20.0–28.0)
Calcium, Ion: 1.05 mmol/L — ABNORMAL LOW (ref 1.15–1.40)
HCT: 24 % — ABNORMAL LOW (ref 39.0–52.0)
Hemoglobin: 8.2 g/dL — ABNORMAL LOW (ref 13.0–17.0)
O2 Saturation: 82 %
Patient temperature: 98.4
Potassium: 5.2 mmol/L — ABNORMAL HIGH (ref 3.5–5.1)
Sodium: 138 mmol/L (ref 135–145)
TCO2: 29 mmol/L (ref 22–32)
pCO2 arterial: 64.4 mmHg — ABNORMAL HIGH (ref 32.0–48.0)
pH, Arterial: 7.23 — ABNORMAL LOW (ref 7.350–7.450)
pO2, Arterial: 56 mmHg — ABNORMAL LOW (ref 83.0–108.0)

## 2020-04-30 LAB — APTT
aPTT: 79 seconds — ABNORMAL HIGH (ref 24–36)
aPTT: 79 seconds — ABNORMAL HIGH (ref 24–36)

## 2020-04-30 LAB — HEPARIN INDUCED PLATELET AB (HIT ANTIBODY): Heparin Induced Plt Ab: 0.204 OD (ref 0.000–0.400)

## 2020-04-30 MED ORDER — SENNA 8.6 MG PO TABS: 1.0000 | ORAL_TABLET | Freq: Every day | ORAL | Status: DC | PRN

## 2020-04-30 MED ORDER — PANTOPRAZOLE SODIUM 40 MG PO TBEC
40.0000 mg | DELAYED_RELEASE_TABLET | Freq: Every day | ORAL | Status: DC
  Administered 2020-05-01: 40 mg via ORAL
  Filled 2020-04-30: qty 1

## 2020-04-30 MED ORDER — INSULIN DETEMIR 100 UNIT/ML ~~LOC~~ SOLN
25.0000 [IU] | Freq: Two times a day (BID) | SUBCUTANEOUS | Status: DC
  Administered 2020-04-30 – 2020-05-01 (×2): 25 [IU] via SUBCUTANEOUS
  Filled 2020-04-30 (×3): qty 0.25

## 2020-04-30 NOTE — Progress Notes (Signed)
[facility] Progress Note    Assessment/ Plan:    69M severe MSOF, ARDS. AKI from [place] in ICU, proned, on pressors, septic shock.  1.  AKI: secondary to distributive shock (septic).  Cr 1.01-->2.3 with some hyperkalemia.  Azotemic and this was previously felt likely d/t steroids/ free water deficit. K normalized, off lokelma.   - RRT has been held.  Discussed with pulm and she states family has expressed that for instance he wouldn't want a trach and they didn't feel that HD was something that he would've wanted on her prior discussions with family.   2. Covid PNA - therapies per pulm/critical care     3.  Acute hypoxic RF 2/2 COVID pneumonia: got remdesivir, baricitinib, steroid taper.  abx per primary team.  Vented per PCCM, proning protocol in place  4.  Hyperkalemia: s/p ca gluconate, bicarb, insulin/ dextrose, lokelma. Off Lokelma   5.  Shock: likely septic/ distributive, TTE without evidence of PE.  Cultures/ pressors/ antibiotics per PCCM. Lower extremity dopplers no DVT.  Respiratory culture growing E coli.  6. Hypernatremia  - resolved  7. Anemia normocytic - PRBC's per primary team   Dispo: ICU. Appreciate pulm and palliative care.  Nephrology will sign off.  Unfortunately renal failure continues to worsen.  He is not pursuing RRT and remains on the vent and pressors.  Team has been discussing goals of care with his family   Subjective:    Pt had 1.2 liter uop over 9/15 charted.  Has been on levo at 28 mcg/min and vasopressin.  Spoke with nursing.   Review of systems:   Unable to be obtained secondary to obtunded, intubated    Objective:   BP (!) 134/43   Pulse 89   Temp 97.9 F (36.6 C) (Axillary)   Resp (!) 35   Ht 6' 2" (1.88 m)   Wt 103.8 kg   SpO2 92%   BMI 29.38 kg/m   Intake/Output Summary (Last 24 hours) at 04/30/2020 0808 Last data filed at 04/30/2020 0800 Gross per 24 hour  Intake 3876.81 ml  Output 1190 ml  Net 2686.81 ml    Weight change: 2.2 kg  Physical Exam GEN intubated, sedated  HEENT eyes closed NECK no JVD PULM proned. reduced breath sounds CV S1S2 no rub ABD soft, nontender EXT 1+LE edema NEURO intubated, sedated  Imaging: DG Chest Port 1 View  Result Date: 04/29/2020 CLINICAL DATA:  Respiratory failure, intubated EXAM: PORTABLE CHEST 1 VIEW COMPARISON:  04/25/2020 FINDINGS: Single frontal view of the chest demonstrates endotracheal tube overlying tracheal air column, tip just below thoracic inlet. Enteric catheter passes below diaphragm tip excluded by collimation. Right internal jugular catheter tip projects over superior vena cava. Cardiac silhouette is stable, with postsurgical changes from previous CABG. There is diffuse interstitial and ground-glass opacity, greatest in the left perihilar region. No effusion or pneumothorax. No acute bony abnormalities. IMPRESSION: 1. Multifocal interstitial and ground-glass opacities consistent with known COVID-19 pneumonia. Slight progression in the left perihilar region. 2. Support devices as above. Electronically Signed   By: Randa Ngo M.D.   On: 04/29/2020 20:58    Labs: [employer] Recent Labs  Lab 04/23/20 2103 04/23/20 2354 04/24/20 0500 04/24/20 1311 04/24/20 1646 04/24/20 1815 04/25/20 0500 04/25/20 0500 04/25/20 0720 04/25/20 1429 04/26/20 0427 04/26/20 1657 04/27/20 0321 04/28/20 0025 04/28/20 0250 04/28/20 0335 04/28/20 2019 04/29/20 0333 04/29/20 0344 04/29/20 1843 04/30/20 0329  NA  --    < > 142   < >  146*   < > 148*   < > 150*   < > 147*   < > 146*   < > 143 142 142 139 140 140 139  K  --    < > 6.0*   < > 6.0*   < > 4.6   < > 4.5   < > 4.4   < > 4.5   < > 4.1 4.4 4.1 4.6 4.5 5.0 5.0  CL  --   --  108   < > 108   < > 109  --  110  --  109  --  110  --   --  105  --  105  --   --  104  CO2  --   --  19*   < > 21*   < > 27  --  28  --  27  --  29  --   --  27  --  24  --   --  23  GLUCOSE  --   --  225*   < > 234*   < > 146*   --  168*  --  333*  --  267*  --   --  122*  --  206*  --   --  207*  BUN  --   --  138*   < > 146*   < > 155*  --  155*  --  158*  --  170*  --   --  184*  --  196*  --   --  205*  CREATININE  --   --  2.15*   < > 2.20*   < > 2.13*  --  2.12*  --  1.99*  --  2.31*  --   --  2.19*  --  2.30*  --   --  2.71*  CALCIUM  --   --  7.6*   < > 8.1*   < > 8.1*  --  8.0*  --  7.5*  --  7.1*  --   --  7.1*  --  6.9*  --   --  7.0*  PHOS 8.7*  --  11.6*  --  10.3*  --  6.9*  --   --   --  5.8*  --   --   --   --  4.4  --  4.5  --   --   --    < > = values in this interval not displayed.   CBC Recent Labs  Lab 04/27/20 0321 04/28/20 0025 04/28/20 0335 04/28/20 2019 04/29/20 0333 04/29/20 0344 04/29/20 1843 04/30/20 0329  WBC 15.8*  --  16.7*  --  15.0*  --   --  18.2*  HGB 8.9*   < > 9.1*   < > 8.2* 8.2* 8.8* 7.7*  HCT 31.6*   < > 31.4*   < > 28.2* 24.0* 26.0* 26.5*  MCV 97.2  --  96.3  --  95.3  --   --  95.0  PLT 79*  --  57*  --  45*  --   --  41*   < > = values in this interval not displayed.    Medications:    . vitamin C  500 mg Per Tube Daily  . aspirin  81 mg Per Tube Daily  . atorvastatin  40 mg Per Tube Daily  . chlorhexidine gluconate (MEDLINE KIT)  15 mL Mouth Rinse  BID  . Chlorhexidine Gluconate Cloth  6 each Topical Daily  . clonazePAM  1 mg Per Tube BID  . docusate  100 mg Per Tube BID  . feeding supplement (PROSource TF)  45 mL Per Tube QID  . feeding supplement (VITAL 1.5 CAL)  1,000 mL Per Tube Q24H  . hydrocortisone sod succinate (SOLU-CORTEF) inj  50 mg Intravenous Q6H  . insulin aspart  0-20 Units Subcutaneous Q4H  . insulin detemir  22 Units Subcutaneous Q12H  . mouth rinse  15 mL Mouth Rinse 10 times per day  . oxyCODONE  5 mg Per Tube Q6H  . pantoprazole (PROTONIX) IV  40 mg Intravenous Daily  . polyethylene glycol  17 g Per Tube Daily  . sevelamer carbonate  0.8 g Per Tube TID WC  . sodium chloride flush  10-40 mL Intracatheter Q12H     Claudia Desanctis  MD 04/30/2020, 8:12 AM

## 2020-04-30 NOTE — Progress Notes (Signed)
NAME:  Alexander Parks, MRN:  [id number], DOB:  Jan 13, 1951, LOS: 24 ADMISSION DATE:  04/09/2020, CONSULTATION DATE:  9/9 REFERRING MD:  Elger[place]awy, CHIEF COMPLAINT:  Dyspnea   Brief History   69 y/o male admitted with COVID 19 pneumonia on 8/23, moved to ICU requiring BIPAP, eventually intubated 9/9.  Past Medical History  Cirrhosis CAD DM2 Kidney stones  Significant Hospital Events   8/23 admission 8/30 move to ICU 9/9 intubated  Consults:  PCCM  Procedures:  9/6 ETT >> 9/10 R IJ >> 9/10 right radial Aline >> 9/10 cortrak >>  Significant Diagnostic Tests:  Echocardiogram 9/10: LVEF 60 to 65%, no regional wall motion abnormalities.  Normal RV.  CXR 9/11-bilateral lower lobe infiltrates  9/10 BLE venous duplex >> negative for DVT  Micro Data:  8/23 Covid + 8/24 MRSA neg 8/23 BCx2 >> neg 9/10 trach asp >> E. Coli 9/10 BCx2 >> ngtd  Antimicrobials:  Cefepime 9/10 >> 9/12 Ceftriaxone 9/13 >  Baricitinib  8/24 >> 9/6 remdesivir 8/23 >>8/27 Solumedrol 8/23 >>9/11  Interim history/subjective:  Worsening renal function today Family has decided that they would not want prolonged support with dialysis or a tracheostomy Still getting prone positioning  Objective   Blood pressure (!) 134/43, pulse 88, temperature 97.9 F (36.6 C), temperature source Axillary, resp. rate (!) 31, height 6\' 2"  (1.88 m), weight 103.8 kg, SpO2 92 %.    Vent Mode: PRVC FiO2 (%):  [70 %-80 %] 80 % Set Rate:  [35 bmp] 35 bmp Vt Set:  [490 mL] 490 mL PEEP:  [12 cmH20-14 cmH20] 14 cmH20 Plateau Pressure:  [26 cmH20-35 cmH20] 30 cmH20   Intake/Output Summary (Last 24 hours) at 04/30/2020 0757 Last data filed at 04/30/2020 0700 Gross per 24 hour  Intake 3840.96 ml  Output 1215 ml  Net 2625.96 ml   Filed Weights   04/27/20 0500 04/29/20 0500 04/30/20 0300  Weight: 91.3 kg 101.6 kg 103.8 kg    Examination:  General:  In bed on vent, prone HENT: NCAT ETT in place PULM: Crackles  bases B, vent supported breathing CV: difficult to examine, prone GI: difficult to examine, prone MSK: normal bulk and tone Neuro: sedated on vent  9/16 CXR > worsening left sided infiltrate, ett in place   Resolved Hospital Problem list     Assessment & Plan:  ARDS due to COVID 19 pneumonia E Coli Pneumonia Continue mechanical ventilation per ARDS protocol Target TVol 6-8cc/kgIBW Target Plateau Pressure < 30cm H20 Target driving pressure less than 15 cm of water Target PaO2 55-65: titrate PEEP/FiO2 per protocol As long as PaO2 to FiO2 ratio is less than 1:150 position in prone position for 16 hours a day Check CVP daily if CVL in place Target CVP less than 4, diurese as necessary Ventilator associated pneumonia prevention protocol 9/16 plan> supine, check ABG to determine, continue hydrocortisone  Need for sedation for mechanical ventilation Fentanyl/versed infusions NMB protocol, RASS -5, nimbex  Septic shock from e coli pneumonia Wean off levophed and vasopressin for MAP > 65 Ceftriaxone > would plan for 10 days  AKI> worsening, family doesn't want HD Monitor BMET and UOP Replace electrolytes as needed Renal dose medicaitons  DM2 with hyperglycemia Aspart, detemir Increase detemir  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  CAD s/p CABG Hypertension Hyperlipidemia Tele No b-blockade with shock  Normocytic anemia Acute thrombocytopenia, 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 Continue agatroban Monitor for bleeding  Protein calorie malnutrition Tube feeding   Best practice:  Diet: tube  feeding to continue Pain/Anxiety/Delirium protocol (if indicated): NMB protocol VAP protocol (if indicated): yes DVT prophylaxis: argatroban GI prophylaxis: Pantoprazole for stress ulcer prophylaxis Glucose control: SSI, Detemir Mobility: bed rest Code Status: DNR Family Communication: I called his wife [name] and gave her an update Disposition: move to non-COVID ICU  Labs    CBC: Recent Labs  Lab 04/26/20 0427 04/26/20 1657 04/27/20 0321 04/28/20 0025 04/28/20 0335 04/28/20 0335 04/28/20 2019 04/29/20 0333 04/29/20 0344 04/29/20 1843 04/30/20 0329  WBC 17.4*  --  15.8*  --  16.7*  --   --  15.0*  --   --  18.2*  HGB 9.9*   < > 8.9*   < > 9.1*   < > 7.5* 8.2* 8.2* 8.8* 7.7*  HCT 33.4*   < > 31.6*   < > 31.4*   < > 22.0* 28.2* 24.0* 26.0* 26.5*  MCV 95.7  --  97.2  --  96.3  --   --  95.3  --   --  95.0  PLT 101*  --  79*  --  57*  --   --  45*  --   --  41*   < > = values in this interval not displayed.    Basic Metabolic Panel: Recent Labs  Lab 04/24/20 0500 04/24/20 1311 04/24/20 1646 04/24/20 1815 04/25/20 0500 04/25/20 0720 04/26/20 0427 04/26/20 1657 04/27/20 0321 04/28/20 0025 04/28/20 0335 04/28/20 0335 04/28/20 2019 04/29/20 0333 04/29/20 0344 04/29/20 1843 04/30/20 0329  NA 142   < > 146*   < > 148*   < > 147*   < > 146*   < > 142   < > 142 139 140 140 139  K 6.0*   < > 6.0*   < > 4.6   < > 4.4   < > 4.5   < > 4.4   < > 4.1 4.6 4.5 5.0 5.0  CL 108   < > 108   < > 109   < > 109  --  110  --  105  --   --  105  --   --  104  CO2 19*   < > 21*   < > 27   < > 27  --  29  --  27  --   --  24  --   --  23  GLUCOSE 225*   < > 234*   < > 146*   < > 333*  --  267*  --  122*  --   --  206*  --   --  207*  BUN 138*   < > 146*   < > 155*   < > 158*  --  170*  --  184*  --   --  196*  --   --  205*  CREATININE 2.15*   < > 2.20*   < > 2.13*   < > 1.99*  --  2.31*  --  2.19*  --   --  2.30*  --   --  2.71*  CALCIUM 7.6*   < > 8.1*   < > 8.1*   < > 7.5*  --  7.1*  --  7.1*  --   --  6.9*  --   --  7.0*  MG 3.4*  --  3.4*  --  3.6*  --   --   --   --   --  3.8*  --   --  3.7*  --   --   --   PHOS 11.6*  --  10.3*  --  6.9*  --  5.8*  --   --   --  4.4  --   --  4.5  --   --   --    < > = values in this interval not displayed.   GFR: Estimated Creatinine Clearance: 33 mL/min (A) (by C-G formula based on SCr of 2.71 mg/dL (H)). Recent Labs   Lab 04/27/20 0321 04/28/20 0335 04/29/20 0333 04/30/20 0329  WBC 15.8* 16.7* 15.0* 18.2*    Liver Function Tests: Recent Labs  Lab 04/24/20 0500 04/24/20 0500 04/25/20 0500 04/26/20 0427 04/27/20 0321 04/28/20 0335 04/30/20 0329  AST 104*  --  95* 60* 59*  --  72*  ALT 114*  --  137* 108* 99*  --  90*  ALKPHOS 107  --  151* 151* 160*  --  126  BILITOT 1.1  --  1.1 1.0 0.8  --  0.7  PROT 5.9*  --  6.0* 5.4* 5.1*  --  5.4*  ALBUMIN 1.9*   < > 1.8* 1.5* 1.4* 1.6* 1.5*   < > = values in this interval not displayed.   No results for input(s): LIPASE, AMYLASE in the last 168 hours. No results for input(s): AMMONIA in the last 168 hours.  ABG    Component Value Date/Time   PHART 7.222 (L) 04/29/2020 1843   PCO2ART 67.6 (HH) 04/29/2020 1843   PO2ART 68 (L) 04/29/2020 1843   HCO3 27.7 04/29/2020 1843   TCO2 30 04/29/2020 1843   ACIDBASEDEF 1.0 04/29/2020 1843   O2SAT 88.0 04/29/2020 1843     Coagulation Profile: No results for input(s): INR, PROTIME in the last 168 hours.  Cardiac Enzymes: No results for input(s): CKTOTAL, CKMB, CKMBINDEX, TROPONINI in the last 168 hours.  HbA1C: Hemoglobin A1C  Date/Time Value Ref Range Status  04/29/2014 03:24 PM 8.8 (H) 4.2 - 6.3 % Final    Comment:    The American Diabetes Association recommends that a primary goal of therapy should be <7% and that physicians should reevaluate the treatment regimen in patients with HbA1c values consistently >8%.    Hgb A1c MFr Bld  Date/Time Value Ref Range Status  03/25/2020 11:59 AM 7.9 (H) 4.8 - 5.6 % Final    Comment:    (NOTE)         Prediabetes: 5.7 - 6.4         Diabetes: >6.4         Glycemic control for adults with diabetes: <7.0   02/10/2020 09:32 AM 7.3 (H) 4.8 - 5.6 % Final    Comment:    (NOTE) Pre diabetes:          5.7%-6.4%  Diabetes:              >6.4%  Glycemic control for   <7.0% adults with diabetes     CBG: Recent Labs  Lab 04/29/20 1550  04/29/20 1947 04/29/20 2306 04/30/20 0331 04/30/20 0744  GLUCAP 165* 183* 179* 190* 226*       Critical care time: 35 minutes     Roselie Awkward, MD [facility] Pager: [phone] Cell: [phone] If no response, call [phone]

## 2020-04-30 NOTE — Consult Note (Signed)
[facility] Nurse wound consult note  Consultation completed by use of remote telehealth camera cart/Elink technology and with assistance from bedside nurse/clinical staff  Reason for Consult: new MARSI (Medical Adhesive Related Skin Injury) lower lip  Wound type:Full thickness; pressure injury; mucosal membrane; right lateral lower lip  Patient is also noted to have: Right ear lobe; DTPI (Deep Tissue Pressure Injury) Right inner thigh; MDRPI (Medical Device Related Pressure Injury)-Stage 2; related to prone position  Pressure Injury POA: No Measurement:  Right lateral lip;less than 1cm  Right ear lobe; less than 1cm Right inner thigh; see nursing flow sheets Wound bed: Right lateral lip; 100% black Right ear lobe; 100% dark purple tissue with beginning of evolution Right inner thigh; 100% pink  Drainage (amount, consistency, odor) none Periwound:intact, edematous Dressing procedure/placement/frequency: Patient on skin care protocol for treatment of pressure injuries with the use of silicone foam Low air loss mattress in place for moisture management and pressure redistribution (important for proning) Prophylactic dressings in place for proning   DC use of medical devices under patient when proning   WOC Nurse team will follow with you and see patient within 10 days for wound assessments.  Please notify [place] nurses of any acute changes in the wounds or any new areas of concern [place] MSN, [place], [place], [place]

## 2020-04-30 NOTE — Progress Notes (Signed)
ANTICOAGULATION CONSULT NOTE  Pharmacy Consult:  Argatroban Indication:  Rule out HIT  Allergies  Allergen Reactions  . Benadryl [Diphenhydramine Hcl] Swelling  . Iodinated Diagnostic Agents Other (See Comments)    Unknown  . Morphine And Related Other (See Comments)    Unknown    Patient Measurements: Height: 6\' 2"  (188 cm) Weight: 103.8 kg (228 lb 13.4 oz) IBW/kg (Calculated) : 82.2  Vital Signs: Temp: 97.9 F (36.6 C) (09/16 0745) Temp Source: Axillary (09/16 0745) BP: 139/43 (09/16 0801) Pulse Rate: 92 (09/16 0945)  Labs: Recent Labs    04/28/20 0335 04/28/20 2019 04/29/20 0333 04/29/20 0333 04/29/20 0344 04/29/20 0344 04/29/20 1830 04/29/20 1843 04/29/20 2111 04/30/20 0329  HGB 9.1*   < > 8.2*   < > 8.2*   < >  --  8.8*  --  7.7*  HCT 31.4*   < > 28.2*   < > 24.0*  --   --  26.0*  --  26.5*  PLT 57*  --  45*  --   --   --   --   --   --  41*  APTT  --   --   --   --   --   --  75*  --  75* 79*  CREATININE 2.19*  --  2.30*  --   --   --   --   --   --  2.71*   < > = values in this interval not displayed.    Estimated Creatinine Clearance: 33 mL/min (A) (by C-G formula based on SCr of 2.71 mg/dL (H)).   Medical History: Past Medical History:  Diagnosis Date  . Allergy   . Cirrhosis ([place])   . Coronary artery disease   . Diabetes mellitus without complication ([place])   . History of kidney stones     Assessment: 54 YOM with Covid-19 on heparin SQ for VTE prophylaxis.  Platelets trending down and now working up for HIT.  Pharmacy consulted to start argatroban.  Hemoglobin low and stable; platelet count low at 41K; no bleeding reported.  LFTs mildly elevated. HIT antibody sent and in progress. This is the second therapeutic aPTT will continue with Q12H aPTT due to patients ICU status and unstable renal function. If labs continue to remain stable, will transition to Q24H aPTT checks on 9/17.  APTT - 79 seconds  Goal of Therapy:  aPTT 50-90 seconds Monitor  platelets by anticoagulation protocol: Yes   Plan:  Continue Argatroban 0.5 mcg/kg/min Q12H aPTT and daily CBC  Monitor LFTs trending up   Cephus Slater, PharmD, [place] Resident [phone] 04/30/2020 10:26 AM

## 2020-04-30 NOTE — Progress Notes (Signed)
Inpatient Diabetes Program Recommendations  AACE/ADA: New Consensus Statement on Inpatient Glycemic Control   Target Ranges:  Prepandial:   less than 140 mg/dL      Peak postprandial:   less than 180 mg/dL (1-2 hours)      Critically ill patients:  140 - 180 mg/dL  Results for Alexander Parks, Alexander Parks (MRN [id number]) as of 04/30/2020 11:55  Ref. Range 04/29/2020 08:08 04/29/2020 11:22 04/29/2020 15:50 04/29/2020 19:47 04/29/2020 23:06 04/30/2020 03:31 04/30/2020 07:44 04/30/2020 11:09  Glucose-Capillary Latest Ref Range: 70 - 99 mg/dL 180 (H) 173 (H) 165 (H) 183 (H) 179 (H) 190 (H) 226 (H) 232 (H)    Review of Glycemic Control  Current orders for Inpatient glycemic control: Levemir 25 units Q12H, Novolog 0-20 units Q4H; Solucortef 50 mg Q6H, Vital @ 20 ml/hr  Inpatient Diabetes Program Recommendations:    Insulin: Noted Levemir increased from 22 units BID to 25 units BID.  Please consider ordering Novolog 3 units Q4H for tube feeding coverage. If tube feeding is stopped or held then Novolog tube feeding coverage should also be stopped or held.  Thanks, Barnie Alderman, RN, MSN, CDE Diabetes Coordinator Inpatient Diabetes Program [phone] (Team Pager from 8am to 5pm)

## 2020-04-30 NOTE — Progress Notes (Signed)
Patient has been placed in prone position at this time without complications. Commerical tube holder removed and ETT secured in proper position with cloth tape. No break downs noted. Patient tolerated well.

## 2020-04-30 NOTE — Progress Notes (Signed)
Patient's head and arms repositioned at this time without complications.

## 2020-05-01 ENCOUNTER — Inpatient Hospital Stay (HOSPITAL_COMMUNITY): Payer: Medicare HMO

## 2020-05-01 LAB — COMPREHENSIVE METABOLIC PANEL
ALT: 96 U/L — ABNORMAL HIGH (ref 0–44)
AST: 89 U/L — ABNORMAL HIGH (ref 15–41)
Albumin: 1.6 g/dL — ABNORMAL LOW (ref 3.5–5.0)
Alkaline Phosphatase: 142 U/L — ABNORMAL HIGH (ref 38–126)
Anion gap: 11 (ref 5–15)
BUN: 223 mg/dL — ABNORMAL HIGH (ref 8–23)
CO2: 24 mmol/L (ref 22–32)
Calcium: 7 mg/dL — ABNORMAL LOW (ref 8.9–10.3)
Chloride: 103 mmol/L (ref 98–111)
Creatinine, Ser: 3.05 mg/dL — ABNORMAL HIGH (ref 0.61–1.24)
GFR calc Af Amer: 23 mL/min — ABNORMAL LOW (ref >60)
GFR calc non Af Amer: 20 mL/min — ABNORMAL LOW (ref >60)
Glucose, Bld: 232 mg/dL — ABNORMAL HIGH (ref 70–99)
Potassium: 5.6 mmol/L — ABNORMAL HIGH (ref 3.5–5.1)
Sodium: 138 mmol/L (ref 135–145)
Total Bilirubin: 0.7 mg/dL (ref 0.3–1.2)
Total Protein: 5.3 g/dL — ABNORMAL LOW (ref 6.5–8.1)

## 2020-05-01 LAB — CBC WITH DIFFERENTIAL/PLATELET
Abs Immature Granulocytes: 1.18 10*3/uL — ABNORMAL HIGH (ref 0.00–0.07)
Basophils Absolute: 0 10*3/uL (ref 0.0–0.1)
Basophils Relative: 0 %
Eosinophils Absolute: 0.1 10*3/uL (ref 0.0–0.5)
Eosinophils Relative: 0 %
HCT: 24.1 % — ABNORMAL LOW (ref 39.0–52.0)
Hemoglobin: 6.9 g/dL — CL (ref 13.0–17.0)
Immature Granulocytes: 6 %
Lymphocytes Relative: 6 %
Lymphs Abs: 1.2 10*3/uL (ref 0.7–4.0)
MCH: 28.2 pg (ref 26.0–34.0)
MCHC: 28.6 g/dL — ABNORMAL LOW (ref 30.0–36.0)
MCV: 98.4 fL (ref 80.0–100.0)
Monocytes Absolute: 1.1 10*3/uL — ABNORMAL HIGH (ref 0.1–1.0)
Monocytes Relative: 5 %
Neutro Abs: 16.5 10*3/uL — ABNORMAL HIGH (ref 1.7–7.7)
Neutrophils Relative %: 83 %
Platelets: 38 10*3/uL — ABNORMAL LOW (ref 150–400)
RBC: 2.45 MIL/uL — ABNORMAL LOW (ref 4.22–5.81)
RDW: 19.1 % — ABNORMAL HIGH (ref 11.5–15.5)
WBC: 20.1 10*3/uL — ABNORMAL HIGH (ref 4.0–10.5)
nRBC: 3.9 % — ABNORMAL HIGH (ref 0.0–0.2)

## 2020-05-01 LAB — GLUCOSE, CAPILLARY
Glucose-Capillary: 198 mg/dL — ABNORMAL HIGH (ref 70–99)
Glucose-Capillary: 213 mg/dL — ABNORMAL HIGH (ref 70–99)
Glucose-Capillary: 249 mg/dL — ABNORMAL HIGH (ref 70–99)

## 2020-05-01 LAB — APTT: aPTT: 82 seconds — ABNORMAL HIGH (ref 24–36)

## 2020-05-01 MED ORDER — HEPARIN SODIUM (PORCINE) 5000 UNIT/ML IJ SOLN
5000.0000 [IU] | Freq: Three times a day (TID) | INTRAMUSCULAR | Status: DC
  Administered 2020-05-01: 5000 [IU] via SUBCUTANEOUS

## 2020-05-01 MED ORDER — ACETAMINOPHEN 325 MG PO TABS: 650.0000 mg | ORAL_TABLET | Freq: Four times a day (QID) | ORAL | Status: DC | PRN

## 2020-05-01 MED ORDER — LORAZEPAM 2 MG/ML IJ SOLN: 2.0000 mg | INTRAMUSCULAR | Status: DC | PRN

## 2020-05-01 MED ORDER — ACETAMINOPHEN 650 MG RE SUPP: 650.0000 mg | Freq: Four times a day (QID) | RECTAL | Status: DC | PRN

## 2020-05-01 MED ORDER — SODIUM ZIRCONIUM CYCLOSILICATE 10 G PO PACK
10.0000 g | PACK | Freq: Once | ORAL | Status: AC
  Administered 2020-05-01: 10 g
  Filled 2020-05-01: qty 1

## 2020-05-01 MED ORDER — GLYCOPYRROLATE 0.2 MG/ML IJ SOLN: 0.2000 mg | INTRAMUSCULAR | Status: DC | PRN

## 2020-05-01 MED ORDER — DIPHENHYDRAMINE HCL 50 MG/ML IJ SOLN: 25.0000 mg | INTRAMUSCULAR | Status: DC | PRN

## 2020-05-01 MED ORDER — MORPHINE BOLUS VIA INFUSION
5.0000 mg | INTRAVENOUS | Status: DC | PRN
  Filled 2020-05-01: qty 5

## 2020-05-01 MED ORDER — GLYCOPYRROLATE 1 MG PO TABS: 1.0000 mg | ORAL_TABLET | ORAL | Status: DC | PRN

## 2020-05-01 MED ORDER — MORPHINE SULFATE (PF) 2 MG/ML IV SOLN: 2.0000 mg | INTRAVENOUS | Status: DC | PRN

## 2020-05-01 MED ORDER — MORPHINE 100MG IN NS 100ML (1MG/ML) PREMIX INFUSION: 0.0000 mg/h | INTRAVENOUS | Status: DC

## 2020-05-01 MED ORDER — POLYVINYL ALCOHOL 1.4 % OP SOLN
1.0000 [drp] | Freq: Four times a day (QID) | OPHTHALMIC | Status: DC | PRN
  Filled 2020-05-01: qty 15

## 2020-05-01 MED ORDER — DEXTROSE 5 % IV SOLN: INTRAVENOUS | Status: DC

## 2020-05-15 NOTE — Progress Notes (Signed)
NAME:  Alexander Parks, MRN:  [id number], DOB:  1950-09-12, LOS: 1 ADMISSION DATE:  03/28/2020, CONSULTATION DATE:  9/9 REFERRING MD:  Elgergawy, CHIEF COMPLAINT:  Dyspnea   Brief History   69 y/o male admitted with COVID 19 pneumonia on 8/23, moved to ICU requiring BIPAP, eventually intubated 9/9.  Past Medical History  Cirrhosis CAD DM2 Kidney stones  Significant Hospital Events   8/23 admission 8/30 move to ICU 9/9 intubated  Consults:  PCCM  Procedures:  9/6 ETT >> 9/10 R IJ >> 9/10 right radial Aline >> 9/10 cortrak >>  Significant Diagnostic Tests:  Echocardiogram 9/10: LVEF 60 to 65%, no regional wall motion abnormalities.  Normal RV.  CXR 9/11-bilateral lower lobe infiltrates  9/10 BLE venous duplex >> negative for DVT  Micro Data:  8/23 Covid + 8/24 MRSA neg 8/23 BCx2 >> neg 9/10 trach asp >> E. Coli 9/10 BCx2 >> ngtd  Antimicrobials:  Cefepime 9/10 >> 9/12 Ceftriaxone 9/13 >  Baricitinib  8/24 >> 9/6 remdesivir 8/23 >>8/27 Solumedrol 8/23 >>9/11  Interim history/subjective:   Prone overnight Oxygenation worse Plateau high, driving pressure 20 Renal function is worse K up a little Hgb down no bleeding  Objective   Blood pressure (!) 134/54, pulse 91, temperature 99.5 F (37.5 C), temperature source Oral, resp. rate (!) 35, height 6\' 2"  (1.88 m), weight 104 kg, SpO2 90 %.    Vent Mode: PRVC FiO2 (%):  [80 %] 80 % Set Rate:  [35 bmp] 35 bmp Vt Set:  [490 mL] 490 mL PEEP:  [14 cmH20] 14 cmH20 Plateau Pressure:  [24 cmH20-31 cmH20] 24 cmH20   Intake/Output Summary (Last 24 hours) at 05/11/2020 0738 Last data filed at 05-11-2020 0700 Gross per 24 hour  Intake 3487.08 ml  Output 925 ml  Net 2562.08 ml   Filed Weights   04/29/20 0500 04/30/20 0300 05-11-20 0500  Weight: 101.6 kg 103.8 kg 104 kg    Examination:  General:  In bed on vent, prone  HENT: NCAT, facial, tongue swelling, ETT in place PULM: Crackles B, vent supported  breathing CV: cannot examine, prone GI: cannot examine, prone MSK: normal bulk and tone Neuro: sedated on vent   9/16 CXR > worsening left sided infiltrate, ett in place   Resolved Hospital Problem list     Assessment & Plan:  ARDS due to COVID 19 pneumonia E Coli Pneumonia Move to supine position Continue full vent support on current settings Plan to withdraw care later today Mouth care/VAP prevention per routine  Need for sedation for mechanical ventilation Nimbex discontinued (has not received in two days) Continue fentanyl/versed for comfort  Septic shock from e coli pneumonia Stop ceftriaxone/levophed/vasopressin after family visit  AKI> worsening again 9/17, with hyperkalemia, family doesn't want HD No more labs  DM2 with hyperglycemia Hold insulin  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  CAD s/p CABG Hypertension Hyperlipidemia Tele to continue  Normocytic anemia Acute thrombocytopenia, possible HITT Monitor for bleeding  Protein calorie malnutrition Hold tube feeding   Best practice:  Diet: tube feeding to continue Pain/Anxiety/Delirium protocol (if indicated): NMB protocol VAP protocol (if indicated): yes DVT prophylaxis: argatroban GI prophylaxis: Pantoprazole for stress ulcer prophylaxis Glucose control: SSI, Detemir Mobility: bed rest Code Status: DNR Family Communication: I spoke to [place] at length on 9/17 and explained that he is worsening on multiple fronts and will not survive this illness. She voiced understanding.  I explained that we need to withdraw care today as we are currently  not offering anything that provides medical benefit.  She plans to come in later today. Disposition: move to non-COVID ICU  Labs   CBC: Recent Labs  Lab 04/27/20 0321 04/28/20 0025 04/28/20 0335 04/28/20 2019 04/29/20 1610 04/29/20 9604 04/29/20 0344 04/29/20 1843 04/30/20 0329 04/30/20 1750 05-05-20 0600  WBC 15.8*  --  16.7*  --  15.0*  --   --    --  18.2*  --  20.1*  NEUTROABS  --   --   --   --   --   --   --   --   --   --  16.5*  HGB 8.9*   < > 9.1*   < > 8.2*   < > 8.2* 8.8* 7.7* 8.2* 6.9*  HCT 31.6*   < > 31.4*   < > 28.2*   < > 24.0* 26.0* 26.5* 24.0* 24.1*  MCV 97.2  --  96.3  --  95.3  --   --   --  95.0  --  98.4  PLT 79*  --  57*  --  45*  --   --   --  41*  --  38*   < > = values in this interval not displayed.    Basic Metabolic Panel: Recent Labs  Lab 04/24/20 1646 04/24/20 1815 04/25/20 0500 04/25/20 0720 04/26/20 0427 04/26/20 1657 04/27/20 0321 04/28/20 0025 04/28/20 0335 04/28/20 2019 04/29/20 5409 04/29/20 0344 04/29/20 1843 04/30/20 0329 04/30/20 1750  NA 146*   < > 148*   < > 147*   < > 146*   < > 142   < > 139 140 140 139 138  K 6.0*   < > 4.6   < > 4.4   < > 4.5   < > 4.4   < > 4.6 4.5 5.0 5.0 5.2*  CL 108   < > 109   < > 109  --  110  --  105  --  105  --   --  104  --   CO2 21*   < > 27   < > 27  --  29  --  27  --  24  --   --  23  --   GLUCOSE 234*   < > 146*   < > 333*  --  267*  --  122*  --  206*  --   --  207*  --   BUN 146*   < > 155*   < > 158*  --  170*  --  184*  --  196*  --   --  205*  --   CREATININE 2.20*   < > 2.13*   < > 1.99*  --  2.31*  --  2.19*  --  2.30*  --   --  2.71*  --   CALCIUM 8.1*   < > 8.1*   < > 7.5*  --  7.1*  --  7.1*  --  6.9*  --   --  7.0*  --   MG 3.4*  --  3.6*  --   --   --   --   --  3.8*  --  3.7*  --   --   --   --   PHOS 10.3*  --  6.9*  --  5.8*  --   --   --  4.4  --  4.5  --   --   --   --    < > =  values in this interval not displayed.   GFR: Estimated Creatinine Clearance: 33.1 mL/min (A) (by C-G formula based on SCr of 2.71 mg/dL (H)). Recent Labs  Lab 04/28/20 0335 04/29/20 0333 04/30/20 0329 May 21, 2020 0600  WBC 16.7* 15.0* 18.2* 20.1*    Liver Function Tests: Recent Labs  Lab 04/25/20 0500 04/26/20 0427 04/27/20 0321 04/28/20 0335 04/30/20 0329  AST 95* 60* 59*  --  72*  ALT 137* 108* 99*  --  90*  ALKPHOS 151* 151* 160*  --   126  BILITOT 1.1 1.0 0.8  --  0.7  PROT 6.0* 5.4* 5.1*  --  5.4*  ALBUMIN 1.8* 1.5* 1.4* 1.6* 1.5*   No results for input(s): LIPASE, AMYLASE in the last 168 hours. No results for input(s): AMMONIA in the last 168 hours.  ABG    Component Value Date/Time   PHART 7.230 (L) 04/30/2020 1750   PCO2ART 64.4 (H) 04/30/2020 1750   PO2ART 56 (L) 04/30/2020 1750   HCO3 27.0 04/30/2020 1750   TCO2 29 04/30/2020 1750   ACIDBASEDEF 1.0 04/30/2020 1750   O2SAT 82.0 04/30/2020 1750     Coagulation Profile: No results for input(s): INR, PROTIME in the last 168 hours.  Cardiac Enzymes: No results for input(s): CKTOTAL, CKMB, CKMBINDEX, TROPONINI in the last 168 hours.  HbA1C: Hemoglobin A1C  Date/Time Value Ref Range Status  04/29/2014 03:24 PM 8.8 (H) 4.2 - 6.3 % Final    Comment:    The American Diabetes Association recommends that a primary goal of therapy should be <7% and that physicians should reevaluate the treatment regimen in patients with HbA1c values consistently >8%.    Hgb A1c MFr Bld  Date/Time Value Ref Range Status  03/17/2020 11:59 AM 7.9 (H) 4.8 - 5.6 % Final    Comment:    (NOTE)         Prediabetes: 5.7 - 6.4         Diabetes: >6.4         Glycemic control for adults with diabetes: <7.0   02/10/2020 09:32 AM 7.3 (H) 4.8 - 5.6 % Final    Comment:    (NOTE) Pre diabetes:          5.7%-6.4%  Diabetes:              >6.4%  Glycemic control for   <7.0% adults with diabetes     CBG: Recent Labs  Lab 04/30/20 1109 04/30/20 1507 04/30/20 1952 04/30/20 2320 2020/05/21 0419  GLUCAP 232* 225* 201* 183* 198*       Critical care time: 35 minutes     Roselie Awkward, MD [place] Pager: [phone] Cell: [phone] If no response, call [phone]

## 2020-05-15 NOTE — Progress Notes (Signed)
Nutrition Brief Note  Chart reviewed. Plan to withdraw care later today. No further nutrition interventions warranted at this time.  Please re-consult as needed.   Kerman Passey MS, RDN, LDN, CNSC Registered Dietitian III Clinical Nutrition RD Pager and On-Call Pager Number Located in [place]

## 2020-05-15 NOTE — Procedures (Deleted)
Entered in error

## 2020-05-15 NOTE — Death Summary Note (Signed)
DEATH SUMMARY   Patient Details  Name: Alexander Parks MRN: [id number] DOB: 1950-09-17  Admission/Discharge Information   Admit Date:  13-Apr-2020  Date of Death: Date of Death: 2020-05-08  Time of Death: Time of Death: November 11, 1700  Length of Stay: 11-14-2022  Referring Physician: Manon Hilding, MD   Reason(s) for Hospitalization  Dyspnea  Diagnoses  Preliminary cause of death:   COVID 2022-11-08 Secondary Diagnoses (including complications and co-morbidities):  Principal Problem:   Acute respiratory failure with hypoxia ([place]) Active Problems:   Chronic systolic heart failure ([place])   Coronary artery disease involving native coronary artery of native heart without angina pectoris   Ischemic cardiomyopathy   S/P CABG x 5   Mixed hyperlipidemia   Type 2 diabetes mellitus ([place])   Hepatic cirrhosis ([place])   Pneumonia due to COVID-19 virus   AKI (acute kidney injury) ([place])   COVID-19   Pressure injury of skin   Shock ([place])   Palliative care encounter   Pneumonia due to Escherichia coli [facility])   Hypoxemia   Brief Hospital Course (including significant findings, care, treatment, and services provided and events leading to death)  Alexander Parks is a 69 y.o. year old male who was admitted to our facility on Apr 14, 2023 in the setting of dyspnea and hypoxemia from COVID 19 pneumonia.  He was treated with baricitinib, remdesivir and solumedrol.  His condition declined and he required intubation on 9/6.  He was admitted to the ICU where steroid treatment was continued and he received antibiotics for presumed bacterial pneumonia.  His condition did not improve despite full mechanical ventilatory support, heavy sedation and neuromuscular blockade treatment. We had multiple conversations with the patient's family regarding his overall condition and advised that he would not survive this illness.  They voiced understanding.  The came for a visit on May 09, 2023 and he was compassionately extubated after.     Pertinent Labs  and Studies  Significant Diagnostic Studies DG Abd 1 View  Result Date: 04/27/2020 CLINICAL DATA:  Vomiting EXAM: ABDOMEN - 1 VIEW COMPARISON:  Chest x-ray 04/25/2020 FINDINGS: Esophageal tube tip overlies the gastric outlet. Radiopaque material within the colon. Nonspecific diffuse decreased bowel gas. No radiopaque calculi. IMPRESSION: Esophageal tube tip overlies the gastric outlet. Nonspecific diffuse decreased bowel gas. Electronically Signed   By: Donavan Foil M.D.   On: 04/27/2020 19:07   DG Chest Port 1 View  Result Date: 08-May-2020 CLINICAL DATA:  ARDS, COVID EXAM: PORTABLE CHEST 1 VIEW COMPARISON:  04/29/2020 FINDINGS: Support devices are unchanged. Prior CABG. Diffuse interstitial prominence and bilateral airspace disease. Airspace disease worsening in the left lower lung since prior study and diffusely throughout the right lung. No effusions or pneumothorax. IMPRESSION: Worsening bilateral airspace disease and interstitial prominence. Electronically Signed   By: Rolm Baptise M.D.   On: 2020-05-08 06:55   DG Chest Port 1 View  Result Date: 04/29/2020 CLINICAL DATA:  Respiratory failure, intubated EXAM: PORTABLE CHEST 1 VIEW COMPARISON:  04/25/2020 FINDINGS: Single frontal view of the chest demonstrates endotracheal tube overlying tracheal air column, tip just below thoracic inlet. Enteric catheter passes below diaphragm tip excluded by collimation. Right internal jugular catheter tip projects over superior vena cava. Cardiac silhouette is stable, with postsurgical changes from previous CABG. There is diffuse interstitial and ground-glass opacity, greatest in the left perihilar region. No effusion or pneumothorax. No acute bony abnormalities. IMPRESSION: 1. Multifocal interstitial and ground-glass opacities consistent with known COVID-19 pneumonia. Slight progression in the left perihilar region.  2. Support devices as above. Electronically Signed   By: Randa Ngo M.D.   On: 04/29/2020  20:58   DG Chest Port 1 View  Result Date: 04/25/2020 CLINICAL DATA:  COVID-19 diagnosed on 03/17/2020, past history coronary artery disease, diabetes mellitus, cirrhosis EXAM: PORTABLE CHEST 1 VIEW COMPARISON:  Portable exam 1452 hours compared to 0505 hours FINDINGS: Tip of endotracheal tube projects 6.2 cm above carina. Feeding tube extends into stomach. RIGHT jugular central venous catheter with tip projecting over SVC. Normal heart size post CABG. Persistent BILATERAL pulmonary infiltrates greater at LEFT base. No pleural effusion or pneumothorax. IMPRESSION: Persistent diffuse BILATERAL pulmonary infiltrates, not significantly changed. Electronically Signed   By: Lavonia Dana M.D.   On: 04/25/2020 16:51   DG CHEST PORT 1 VIEW  Result Date: 04/25/2020 CLINICAL DATA:  COVID positive.  Respiratory failure EXAM: PORTABLE CHEST 1 VIEW COMPARISON:  04/25/2019 FINDINGS: Endotracheal tube and central venous line unchanged. Removal of NG tube and replacement with feeding tube. Feeding tube extends the stomach. Bibasilar mild airspace disease. No pneumothorax. IMPRESSION: 1. Stable support apparatus. 2. Mild bibasilar airspace disease. 3. Feeding tube extends into the stomach. Electronically Signed   By: Suzy Bouchard M.D.   On: 04/25/2020 07:25   DG CHEST PORT 1 VIEW  Result Date: 04/24/2020 CLINICAL DATA:  Central line placement. EXAM: PORTABLE CHEST 1 VIEW COMPARISON:  04/24/2020. FINDINGS: Endotracheal tube, NG tube, right IJ line stable position. Prior CABG. Heart size stable. Diffuse bilateral pulmonary infiltrates/edema again noted. Slight worsening noted on today's exam. No pleural effusion or pneumothorax. Costophrenic angles incompletely imaged. IMPRESSION: 1. Interim placement right IJ line. Tip over SVC. Endotracheal tube and NG tube stable position. 2.  Prior CABG.  Heart size stable. 3. Diffuse bilateral pulmonary infiltrates/edema again noted. Slight worsening noted on today's exam.  Electronically Signed   By: Marcello Moores  Register   On: 04/24/2020 11:29   DG CHEST PORT 1 VIEW  Result Date: 04/24/2020 CLINICAL DATA:  Intubation.  COVID. EXAM: PORTABLE CHEST 1 VIEW COMPARISON:  04/23/2020. FINDINGS: Endotracheal tube and NG tube in stable position. Prior CABG. Heart size normal. Diffuse bilateral pulmonary infiltrates again noted without interim change. No pleural effusion or pneumothorax. Degenerative change thoracic spine. IMPRESSION: 1.  Endotracheal tube and NG tube in stable position. 2.  Prior CABG.  Heart size normal. 3. Diffuse bilateral pulmonary infiltrates again noted without interim change. Electronically Signed   By: Marcello Moores  Register   On: 04/24/2020 05:22   DG CHEST PORT 1 VIEW  Result Date: 04/23/2020 CLINICAL DATA:  69 year old male intubated.  COVID-19. EXAM: PORTABLE CHEST 1 VIEW COMPARISON:  Portable chest 1236 hours today and earlier. FINDINGS: Portable AP semi upright view at 1735 hours. Intubated. Endotracheal tube tip in good position between the level the clavicles and carina. Enteric tube placed, side hole the level of the gastric body. Mildly improved lung volumes from earlier today. Mediastinal contours remain normal. Prior CABG. Coarse bilateral pulmonary interstitial opacity most pronounced at the lung bases appear stable since 04/20/2020. No pneumothorax or pleural effusion. Negative visible bowel gas pattern. Stable cholecystectomy clips. IMPRESSION: 1. Endotracheal tube and enteric tube in good position. 2. Stable bilateral COVID-19 pneumonia since 04/20/2020. Electronically Signed   By: Genevie Ann M.D.   On: 04/23/2020 17:51   DG CHEST PORT 1 VIEW  Result Date: 04/23/2020 CLINICAL DATA:  Respiratory failure EXAM: PORTABLE CHEST 1 VIEW COMPARISON:  Radiograph 04/20/2020 FINDINGS: Sternotomy wires overlie normal cardiac silhouette. Mild patchy bilateral airspace  disease. Some increased density in the LEFT lower lobe. Improvement in RIGHT lower lobe opacities. No  pneumothorax. IMPRESSION: 1. Overall minimal interval change in bilateral airspace disease. 2. Some mild improvement in the RIGHT lower lobe and increased density LEFT lung. Electronically Signed   By: Suzy Bouchard M.D.   On: 04/23/2020 12:53   DG Chest Port 1 View  Result Date: 04/20/2020 CLINICAL DATA:  Shortness of breath, COVID-19 positive. EXAM: PORTABLE CHEST 1 VIEW COMPARISON:  April 18, 2020. FINDINGS: Stable cardiomediastinal silhouette. Status post coronary bypass graft. No pneumothorax or pleural effusion is noted. Stable bilateral lung opacities are noted concerning for multifocal pneumonia. Bony thorax is unremarkable. IMPRESSION: Stable bilateral lung opacities are noted concerning for multifocal pneumonia. Electronically Signed   By: Marijo Conception M.D.   On: 04/20/2020 08:22   DG Chest Port 1 View  Result Date: 04/18/2020 CLINICAL DATA:  COVID. EXAM: PORTABLE CHEST 1 VIEW COMPARISON:  04/13/2020 FINDINGS: Bilateral indistinct pulmonary opacities with mild progression from before. Borderline heart size. Stable aortic tortuosity. There has been CABG. No visible effusion or pneumothorax. IMPRESSION: Mild progression of bilateral pneumonia. Electronically Signed   By: Monte Fantasia M.D.   On: 04/18/2020 09:05   DG Chest Port 1 View  Result Date: 04/13/2020 CLINICAL DATA:  Shortness of breath, COVID-19 positive EXAM: PORTABLE CHEST 1 VIEW COMPARISON:  04/10/2020 FINDINGS: Post CABG changes. Stable cardiomediastinal contours. Atherosclerotic calcification of the aortic knob. Patchy opacities within the periphery of the left mid to lower lung fields, slightly progressed from prior. Streaky right basilar interstitial opacity. No large pleural fluid collection. No pneumothorax. IMPRESSION: Slight progression of airspace opacities, most pronounced in the periphery of the left lung. Findings favored to represent atypical/viral infection in the setting of known COVID 19. Electronically Signed    By: Davina Poke D.O.   On: 04/13/2020 08:26   DG Chest Port 1 View  Result Date: 04/10/2020 CLINICAL DATA:  Chest pain, shortness of breath and low oxygen saturation, recent diagnosis of COVID-19, diabetes mellitus, former smoker, cirrhosis, coronary artery disease post CABG EXAM: PORTABLE CHEST 1 VIEW COMPARISON:  Portable exam 1057 hours compared to 03/25/2020 FINDINGS: Upper normal heart size post CABG. Tortuous aorta with minimal atherosclerotic calcification. Pulmonary vascularity normal. Interstitial infiltrates LEFT lung question atypical infection. No pleural effusion or pneumothorax. Osseous structures unremarkable. IMPRESSION: Infiltrates in mid to inferior LEFT lung question atypical infection. Electronically Signed   By: Lavonia Dana M.D.   On: 04/10/2020 11:23   DG Chest Port 1 View  Result Date: 03/26/2020 CLINICAL DATA:  69 year old male positive COVID-19.  Cough. EXAM: PORTABLE CHEST 1 VIEW COMPARISON:  02/21/2020 chest radiographs and earlier. FINDINGS: AP view at 1137 hours. Increased lordotic positioning. Possibly lower lung volumes. Stable cardiac size and mediastinal contours. Prior CABG. Chronic increased interstitial markings in both lungs, with mild new patchy and vague opacity along the right minor fissure and possibly also at the left lung base. No superimposed pneumothorax or pleural effusion. Visualized tracheal air column is within normal limits. No acute osseous abnormality identified. Paucity of bowel gas in the upper abdomen. IMPRESSION: Chronic pulmonary interstitial changes suspected with evidence of mild superimposed COVID-19 pneumonia. Electronically Signed   By: Genevie Ann M.D.   On: 04/14/2020 11:51   VAS [place] LOWER EXTREMITY VENOUS (DVT)  Result Date: 04/24/2020  Lower Venous DVTStudy Indications: Elevated D-dimer.  Comparison Study: No prior studies. Performing Technologist: Darlin Coco  Examination Guidelines: A complete evaluation includes B-mode imaging,  spectral Doppler, color Doppler, and power Doppler as needed of all accessible portions of each vessel. Bilateral testing is considered an integral part of a complete examination. Limited examinations for reoccurring indications may be performed as noted. The reflux portion of the exam is performed with the patient in reverse Trendelenburg.  +---------+---------------+---------+-----------+----------+--------------+ RIGHT    CompressibilityPhasicitySpontaneityPropertiesThrombus Aging +---------+---------------+---------+-----------+----------+--------------+ CFV      Full           Yes      Yes                                 +---------+---------------+---------+-----------+----------+--------------+ SFJ      Full                                                        +---------+---------------+---------+-----------+----------+--------------+ FV Prox  Full                                                        +---------+---------------+---------+-----------+----------+--------------+ FV Mid   Full                                                        +---------+---------------+---------+-----------+----------+--------------+ FV DistalFull                                                        +---------+---------------+---------+-----------+----------+--------------+ PFV      Full                                                        +---------+---------------+---------+-----------+----------+--------------+ POP      Full           Yes      Yes                                 +---------+---------------+---------+-----------+----------+--------------+ PTV      Full                                                        +---------+---------------+---------+-----------+----------+--------------+ PERO     Full                                                        +---------+---------------+---------+-----------+----------+--------------+    +---------+---------------+---------+-----------+----------+--------------+  LEFT     CompressibilityPhasicitySpontaneityPropertiesThrombus Aging +---------+---------------+---------+-----------+----------+--------------+ CFV      Full           Yes      Yes                                 +---------+---------------+---------+-----------+----------+--------------+ SFJ      Full                                                        +---------+---------------+---------+-----------+----------+--------------+ FV Prox  Full                                                        +---------+---------------+---------+-----------+----------+--------------+ FV Mid   Full                                                        +---------+---------------+---------+-----------+----------+--------------+ FV DistalFull                                                        +---------+---------------+---------+-----------+----------+--------------+ PFV      Full                                                        +---------+---------------+---------+-----------+----------+--------------+ POP      Full           Yes      Yes                                 +---------+---------------+---------+-----------+----------+--------------+ PTV      Full                                                        +---------+---------------+---------+-----------+----------+--------------+ PERO     Full                                                        +---------+---------------+---------+-----------+----------+--------------+     Summary: RIGHT: - There is no evidence of deep vein thrombosis in the lower extremity.  - No cystic structure found in the popliteal fossa.  LEFT: - There is no evidence of deep vein thrombosis in the lower extremity.  - No cystic  structure found in the popliteal fossa.  *See table(s) above for measurements and observations. Electronically signed  by Monica Martinez MD on 04/24/2020 at 4:43:11 PM.    Final    ECHOCARDIOGRAM LIMITED  Result Date: 04/24/2020    ECHOCARDIOGRAM LIMITED REPORT   Patient Name:   LORIN GAWRON Date of Exam: 04/24/2020 Medical Rec #:  [id number]        Height:       74.0 in Accession #:    [id number]       Weight:       197.8 lb Date of Birth:  1951-05-30        BSA:          2.163 m Patient Age:    45 years         BP:           133/59 mmHg Patient Gender: M                HR:           77 bpm. Exam Location:  Inpatient Procedure: Limited Color Doppler and Cardiac Doppler Indications:    shock  History:        Patient has prior history of Echocardiogram examinations, most                 recent 06/27/2018. Prior CABG, Covid. cirrhosis; Risk                 Factors:Dyslipidemia and Diabetes.  Sonographer:    Johny Chess Referring Phys: [id number] [place]  1. Left ventricular ejection fraction, by estimation, is 60 to 65%. The left ventricle has normal function. The left ventricle has no regional wall motion abnormalities.  2. Right ventricular systolic function is normal. The right ventricular size is normal. There is normal pulmonary artery systolic pressure.  3. The mitral valve is normal in structure. No evidence of mitral valve regurgitation. No evidence of mitral stenosis.  4. The aortic valve is normal in structure. Aortic valve regurgitation is not visualized. No aortic stenosis is present. FINDINGS  Left Ventricle: Left ventricular ejection fraction, by estimation, is 60 to 65%. The left ventricle has normal function. The left ventricle has no regional wall motion abnormalities. The left ventricular internal cavity size was normal in size. Right Ventricle: The right ventricular size is normal. No increase in right ventricular wall thickness. Right ventricular systolic function is normal. There is normal pulmonary artery systolic pressure. The tricuspid regurgitant velocity is 2.59 m/s, and  with an  assumed right atrial pressure of 3 mmHg, the estimated right ventricular systolic pressure is 86.7 mmHg. Mitral Valve: The mitral valve is normal in structure. No evidence of mitral valve stenosis. Tricuspid Valve: The tricuspid valve is normal in structure. Tricuspid valve regurgitation is trivial. Aortic Valve: The aortic valve is normal in structure. Aortic valve regurgitation is not visualized. No aortic stenosis is present. Pulmonic Valve: The pulmonic valve was normal in structure. Pulmonic valve regurgitation is not visualized. LEFT VENTRICLE PLAX 2D LVIDd:         4.90 cm  Diastology LVIDs:         2.60 cm  LV e' medial:    7.07 cm/s LV PW:         0.90 cm  LV E/e' medial:  8.7 LV IVS:        1.00 cm  LV e' lateral:   11.10 cm/s LVOT diam:     2.10  cm  LV E/e' lateral: 5.6 LV SV:         56 LV SV Index:   26 LVOT Area:     3.46 cm  IVC IVC diam: 2.10 cm LEFT ATRIUM         Index LA diam:    3.10 cm 1.43 cm/m  AORTIC VALVE LVOT Vmax:   73.10 cm/s LVOT Vmean:  50.000 cm/s LVOT VTI:    0.161 m  AORTA Ao Root diam: 3.90 cm Ao Asc diam:  3.60 cm MITRAL VALVE               TRICUSPID VALVE MV Area (PHT): 3.27 cm    TR Peak grad:   26.8 mmHg MV Decel Time: 232 msec    TR Vmax:        259.00 cm/s MV E velocity: 61.70 cm/s MV A velocity: 70.70 cm/s  SHUNTS MV E/A ratio:  0.87        Systemic VTI:  0.16 m                            Systemic Diam: 2.10 cm Mertie Moores MD Electronically signed by Mertie Moores MD Signature Date/Time: 04/24/2020/3:42:03 PM    Final    [place] EKG SITE RITE  Result Date: 04/24/2020 If Site Rite image not attached, placement could not be confirmed due to current cardiac rhythm.   Microbiology Recent Results (from the past 240 hour(s))  Culture, blood (Routine X 2) w Reflex to ID Panel     Status: None   Collection Time: 04/24/20  4:27 PM   Specimen: BLOOD RIGHT HAND  Result Value Ref Range Status   Specimen Description BLOOD RIGHT HAND  Final   Special Requests   Final     BOTTLES DRAWN AEROBIC ONLY Blood Culture results may not be optimal due to an inadequate volume of blood received in culture bottles   Culture   Final    NO GROWTH 5 DAYS Performed at [facility], [place] [address] [place], [place] [address]    Report Status 04/29/2020 FINAL  Final  Culture, blood (Routine X 2) w Reflex to ID Panel     Status: None   Collection Time: 04/24/20  4:40 PM   Specimen: BLOOD LEFT HAND  Result Value Ref Range Status   Specimen Description BLOOD LEFT HAND  Final   Special Requests   Final    BOTTLES DRAWN AEROBIC ONLY Blood Culture results may not be optimal due to an inadequate volume of blood received in culture bottles   Culture   Final    NO GROWTH 5 DAYS Performed at [facility], [place] [address] [place], [place] [address]    Report Status 04/29/2020 FINAL  Final  Culture, respiratory (non-expectorated)     Status: None   Collection Time: 04/24/20  5:08 PM   Specimen: Tracheal Aspirate; Respiratory  Result Value Ref Range Status   Specimen Description TRACHEAL ASPIRATE  Final   Special Requests NONE  Final   Gram Stain   Final    RARE WBC PRESENT, PREDOMINANTLY PMN ABUNDANT GRAM POSITIVE RODS Performed at [facility], [place] [address], [place], [place] [address]    Culture MODERATE ESCHERICHIA COLI  Final   Report Status 04/26/2020 FINAL  Final   Organism ID, Bacteria ESCHERICHIA COLI  Final      Susceptibility   Escherichia coli - MIC*  AMPICILLIN <=2 SENSITIVE Sensitive     CEFAZOLIN <=4 SENSITIVE Sensitive     CEFEPIME <=0.12 SENSITIVE Sensitive     CEFTAZIDIME <=1 SENSITIVE Sensitive     CEFTRIAXONE <=0.25 SENSITIVE Sensitive     CIPROFLOXACIN <=0.25 SENSITIVE Sensitive     GENTAMICIN <=1 SENSITIVE Sensitive     IMIPENEM <=0.25 SENSITIVE Sensitive     TRIMETH/SULFA <=20 SENSITIVE Sensitive     AMPICILLIN/SULBACTAM <=2 SENSITIVE Sensitive     PIP/TAZO 8 SENSITIVE Sensitive     * MODERATE ESCHERICHIA COLI    Lab Basic  Metabolic Panel: Recent Labs  Lab 04/25/20 0500 04/25/20 0720 04/26/20 0427 04/26/20 1657 04/27/20 0321 04/28/20 0025 04/28/20 0335 04/28/20 2019 04/29/20 0333 04/29/20 0333 04/29/20 0344 04/29/20 1843 04/30/20 0329 04/30/20 1750 05/21/2020 0600  NA 148*   < > 147*   < > 146*   < > 142   < > 139   < > 140 140 139 138 138  K 4.6   < > 4.4   < > 4.5   < > 4.4   < > 4.6   < > 4.5 5.0 5.0 5.2* 5.6*  CL 109   < > 109  --  110  --  105  --  105  --   --   --  104  --  103  CO2 27   < > 27  --  29  --  27  --  24  --   --   --  23  --  24  GLUCOSE 146*   < > 333*  --  267*  --  122*  --  206*  --   --   --  207*  --  232*  BUN 155*   < > 158*  --  170*  --  184*  --  196*  --   --   --  205*  --  223*  CREATININE 2.13*   < > 1.99*  --  2.31*  --  2.19*  --  2.30*  --   --   --  2.71*  --  3.05*  CALCIUM 8.1*   < > 7.5*  --  7.1*  --  7.1*  --  6.9*  --   --   --  7.0*  --  7.0*  MG 3.6*  --   --   --   --   --  3.8*  --  3.7*  --   --   --   --   --   --   PHOS 6.9*  --  5.8*  --   --   --  4.4  --  4.5  --   --   --   --   --   --    < > = values in this interval not displayed.   Liver Function Tests: Recent Labs  Lab 04/25/20 0500 04/25/20 0500 04/26/20 0427 04/27/20 0321 04/28/20 0335 04/30/20 0329 2020-05-21 0600  AST 95*  --  60* 59*  --  72* 89*  ALT 137*  --  108* 99*  --  90* 96*  ALKPHOS 151*  --  151* 160*  --  126 142*  BILITOT 1.1  --  1.0 0.8  --  0.7 0.7  PROT 6.0*  --  5.4* 5.1*  --  5.4* 5.3*  ALBUMIN 1.8*   < > 1.5* 1.4* 1.6* 1.5* 1.6*   < > = values in  this interval not displayed.   No results for input(s): LIPASE, AMYLASE in the last 168 hours. No results for input(s): AMMONIA in the last 168 hours. CBC: Recent Labs  Lab 04/27/20 0321 04/28/20 0025 04/28/20 0335 04/28/20 2019 04/29/20 0333 04/29/20 0333 04/29/20 0344 04/29/20 1843 04/30/20 0329 04/30/20 1750 05/13/2020 0600  WBC 15.8*  --  16.7*  --  15.0*  --   --   --  18.2*  --  20.1*   NEUTROABS  --   --   --   --   --   --   --   --   --   --  16.5*  HGB 8.9*   < > 9.1*   < > 8.2*   < > 8.2* 8.8* 7.7* 8.2* 6.9*  HCT 31.6*   < > 31.4*   < > 28.2*   < > 24.0* 26.0* 26.5* 24.0* 24.1*  MCV 97.2  --  96.3  --  95.3  --   --   --  95.0  --  98.4  PLT 79*  --  57*  --  45*  --   --   --  41*  --  38*   < > = values in this interval not displayed.   Cardiac Enzymes: No results for input(s): CKTOTAL, CKMB, CKMBINDEX, TROPONINI in the last 168 hours. Sepsis Labs: Recent Labs  Lab 04/28/20 0335 04/29/20 0333 04/30/20 0329 05-13-20 0600  WBC 16.7* 15.0* 18.2* 20.1*    Procedures/Operations  9/6 ETT >> 9/10 R IJ >> 9/10 right radial Aline >> 9/10 cortrak >>    Roselie Awkward 05-13-2020, 6:38 PM

## 2020-05-15 NOTE — Progress Notes (Signed)
RT note. Pt. Prone at this time without any complications, pt. Tolerated well.

## 2020-05-15 NOTE — Progress Notes (Signed)
This chaplain responded to MD consult for EOL spiritual care for Pt. and family. The chaplain was updated by the Pt. RN-Lindsey. The Pt. wife-[name] and daughter-[name] are bedside. The chaplain listened as [name] shared her love for the Pt.  The chaplain's invitation for prayer was accepted by the family before the Pt. was extubated.  F/U spiritual care is available as needed.

## 2020-05-15 NOTE — Progress Notes (Signed)
Extubated patient for comfort care with family, chaplain and RN at bedside.

## 2020-05-15 NOTE — Progress Notes (Signed)
Time of death 46. RN at bedside with patients wife and daughter. Time of death verified with 2 RNs and MD notified.

## 2020-05-15 NOTE — Progress Notes (Signed)
Patient placed on supine position by RT x 2 and RN x 3 without complications.  ETT re-secured 25 cm in the center with a commercial tube holder.

## 2020-05-15 DEATH — deceased

## 2020-07-07 ENCOUNTER — Ambulatory Visit (INDEPENDENT_AMBULATORY_CARE_PROVIDER_SITE_OTHER): Payer: Medicare HMO | Admitting: Internal Medicine

## 2021-05-17 IMAGING — DX DG CHEST 2V
2 series · 2 of 2 positions shown · non-contrast
Comparison: June 14, 2018

CLINICAL DATA: Fever

EXAM:
CHEST - 2 VIEW

[chest pa]
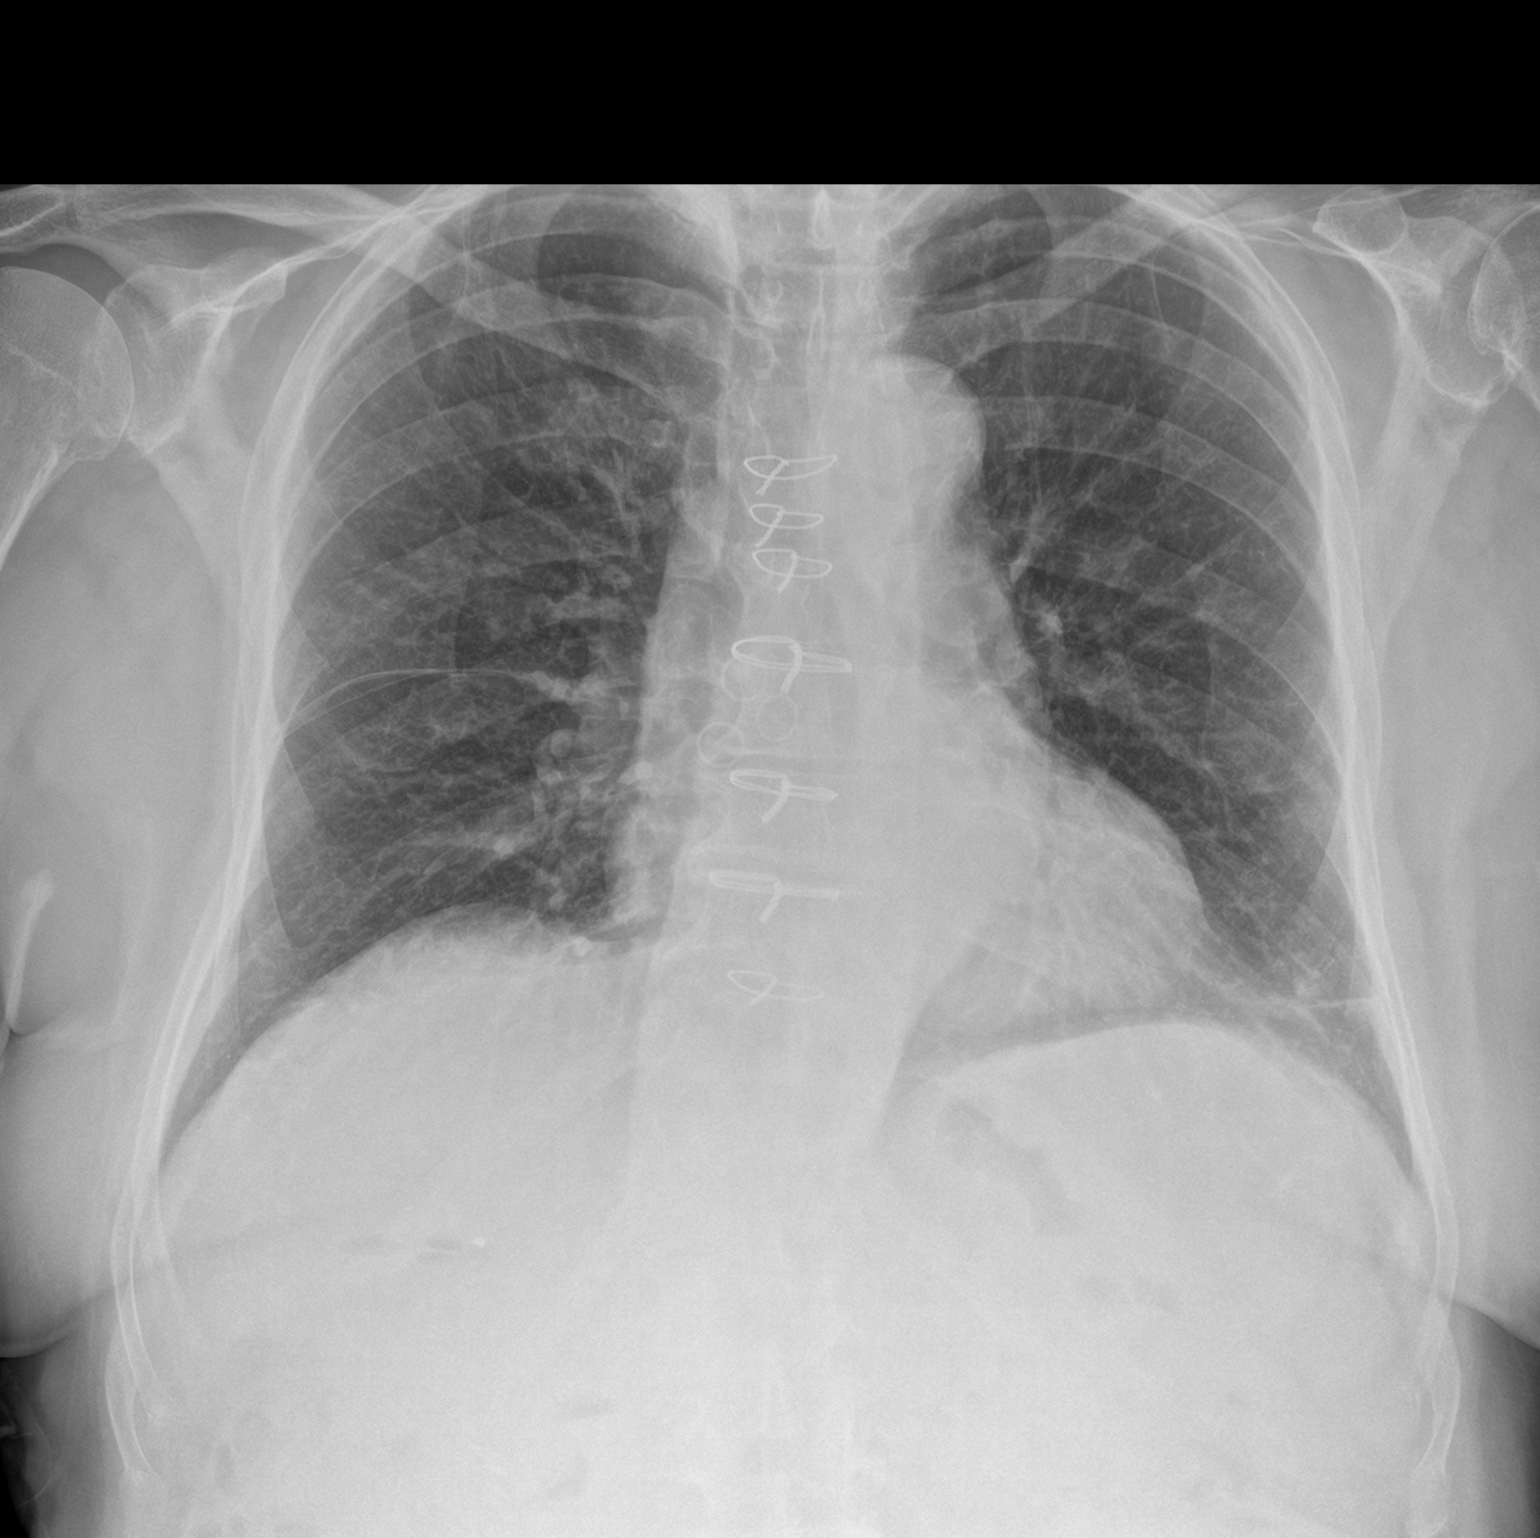

[chest lat]
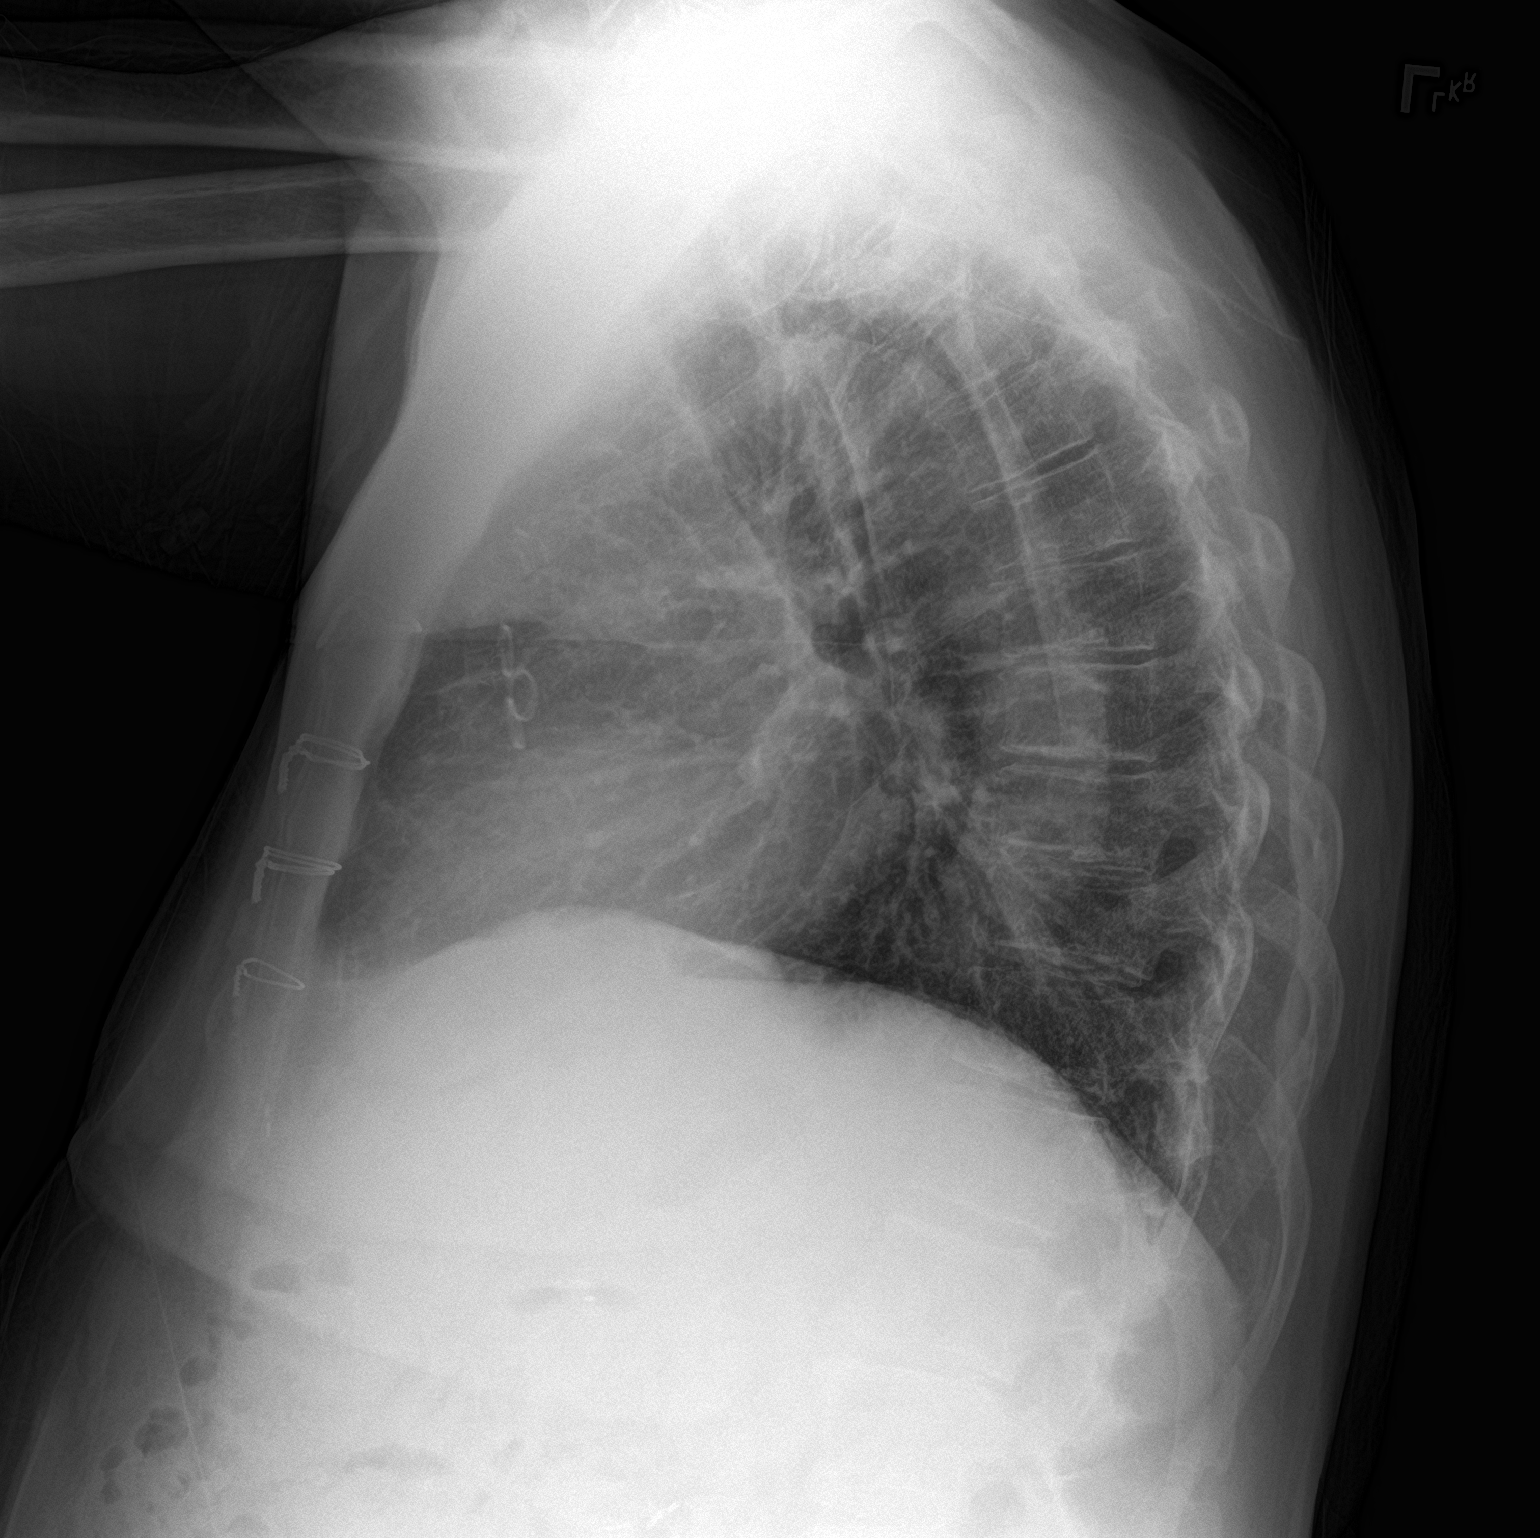

[2 of 2 positions shown; findings below may reference images not displayed]

FINDINGS: Minimal opacity in the lateral left lung base is stable since
May 2018. No pneumothorax. No nodule or mass. No other
infiltrates. The cardiomediastinal silhouette is stable.
IMPRESSION: Stable minimal opacity in left base consistent with scar or chronic
atelectasis. No acute abnormalities.
